# Patient Record
Sex: Female | Born: 1955 | Race: Black or African American | Hispanic: No | Smoking: Current every day smoker
Health system: Southern US, Community
[De-identification: ages and names within clinical notes are randomized; demographics above are authoritative.]

## PROBLEM LIST (undated history)

## (undated) DIAGNOSIS — D649 Anemia, unspecified: Secondary | ICD-10-CM

## (undated) DIAGNOSIS — E119 Type 2 diabetes mellitus without complications: Secondary | ICD-10-CM

## (undated) DIAGNOSIS — I10 Essential (primary) hypertension: Secondary | ICD-10-CM

## (undated) DIAGNOSIS — F209 Schizophrenia, unspecified: Secondary | ICD-10-CM

## (undated) DIAGNOSIS — J449 Chronic obstructive pulmonary disease, unspecified: Secondary | ICD-10-CM

## (undated) DIAGNOSIS — M48062 Spinal stenosis, lumbar region with neurogenic claudication: Secondary | ICD-10-CM

## (undated) DIAGNOSIS — J441 Chronic obstructive pulmonary disease with (acute) exacerbation: Secondary | ICD-10-CM

## (undated) DIAGNOSIS — C801 Malignant (primary) neoplasm, unspecified: Secondary | ICD-10-CM

## (undated) DIAGNOSIS — F32A Depression, unspecified: Secondary | ICD-10-CM

## (undated) DIAGNOSIS — G4730 Sleep apnea, unspecified: Secondary | ICD-10-CM

## (undated) DIAGNOSIS — K219 Gastro-esophageal reflux disease without esophagitis: Secondary | ICD-10-CM

## (undated) DIAGNOSIS — E785 Hyperlipidemia, unspecified: Secondary | ICD-10-CM

## (undated) DIAGNOSIS — M1990 Unspecified osteoarthritis, unspecified site: Secondary | ICD-10-CM

## (undated) DIAGNOSIS — F419 Anxiety disorder, unspecified: Secondary | ICD-10-CM

## (undated) DIAGNOSIS — F205 Residual schizophrenia: Secondary | ICD-10-CM

## (undated) DIAGNOSIS — D509 Iron deficiency anemia, unspecified: Secondary | ICD-10-CM

## (undated) DIAGNOSIS — I700 Atherosclerosis of aorta: Secondary | ICD-10-CM

## (undated) DIAGNOSIS — G5603 Carpal tunnel syndrome, bilateral upper limbs: Secondary | ICD-10-CM

## (undated) HISTORY — PX: APPENDECTOMY: SHX54

## (undated) HISTORY — PX: JOINT REPLACEMENT: SHX530

## (undated) HISTORY — DX: Anemia, unspecified: D64.9

## (undated) HISTORY — DX: Chronic obstructive pulmonary disease with (acute) exacerbation: J44.1

---

## 2008-12-05 ENCOUNTER — Inpatient Hospital Stay: Payer: Self-pay | Admitting: Psychiatry

## 2009-04-06 ENCOUNTER — Emergency Department: Payer: Self-pay

## 2009-06-18 ENCOUNTER — Inpatient Hospital Stay: Payer: Self-pay | Admitting: Internal Medicine

## 2010-07-30 ENCOUNTER — Emergency Department: Payer: Self-pay | Admitting: Unknown Physician Specialty

## 2010-08-12 ENCOUNTER — Inpatient Hospital Stay: Payer: Self-pay | Admitting: Internal Medicine

## 2010-08-16 DIAGNOSIS — I359 Nonrheumatic aortic valve disorder, unspecified: Secondary | ICD-10-CM

## 2010-09-25 ENCOUNTER — Emergency Department: Payer: Self-pay | Admitting: *Deleted

## 2010-09-25 ENCOUNTER — Emergency Department: Payer: Self-pay | Admitting: Emergency Medicine

## 2011-06-25 ENCOUNTER — Ambulatory Visit: Payer: Self-pay | Admitting: Pain Medicine

## 2011-10-08 ENCOUNTER — Ambulatory Visit: Payer: Self-pay | Admitting: Unknown Physician Specialty

## 2011-10-08 DIAGNOSIS — I10 Essential (primary) hypertension: Secondary | ICD-10-CM

## 2011-10-08 LAB — BASIC METABOLIC PANEL
Anion Gap: 9 (ref 7–16)
BUN: 9 mg/dL (ref 7–18)
Creatinine: 0.9 mg/dL (ref 0.60–1.30)
EGFR (African American): >60
Osmolality: 281 (ref 275–301)
Potassium: 3.7 mmol/L (ref 3.5–5.1)

## 2011-10-08 LAB — URINALYSIS, COMPLETE
Bacteria: NONE SEEN
Glucose,UR: NEGATIVE mg/dL (ref 0–75)
Hyaline Cast: 3
Leukocyte Esterase: NEGATIVE
Protein: NEGATIVE
RBC,UR: 5 /HPF (ref 0–5)
Specific Gravity: 1.016 (ref 1.003–1.030)
Squamous Epithelial: 13
WBC UR: <1 /HPF (ref 0–5)

## 2011-10-08 LAB — MRSA PCR SCREENING

## 2011-10-08 LAB — PROTIME-INR: INR: 0.9

## 2011-10-08 LAB — CBC
HCT: 40.1 % (ref 35.0–47.0)
MCV: 84 fL (ref 80–100)
Platelet: 208 10*3/uL (ref 150–440)

## 2011-10-20 ENCOUNTER — Inpatient Hospital Stay: Payer: Self-pay | Admitting: Unknown Physician Specialty

## 2011-10-20 LAB — ELECTROLYTE PANEL
Anion Gap: 8 (ref 7–16)
Chloride: 105 mmol/L (ref 98–107)
Co2: 28 mmol/L (ref 21–32)
Potassium: 4 mmol/L (ref 3.5–5.1)

## 2011-10-20 LAB — HEMOGLOBIN: HGB: 10.2 g/dL — ABNORMAL LOW (ref 12.0–16.0)

## 2011-10-21 LAB — BASIC METABOLIC PANEL
Calcium, Total: 7.6 mg/dL — ABNORMAL LOW (ref 8.5–10.1)
Chloride: 99 mmol/L (ref 98–107)
Creatinine: 0.66 mg/dL (ref 0.60–1.30)
EGFR (African American): >60
EGFR (Non-African Amer.): >60
Glucose: 197 mg/dL — ABNORMAL HIGH (ref 65–99)
Sodium: 135 mmol/L — ABNORMAL LOW (ref 136–145)

## 2011-10-22 LAB — TSH: Thyroid Stimulating Horm: 2.35 u[IU]/mL

## 2011-10-22 LAB — BASIC METABOLIC PANEL
Anion Gap: 8 (ref 7–16)
BUN: 6 mg/dL — ABNORMAL LOW (ref 7–18)
Calcium, Total: 8.2 mg/dL — ABNORMAL LOW (ref 8.5–10.1)
Chloride: 99 mmol/L (ref 98–107)
Creatinine: 0.77 mg/dL (ref 0.60–1.30)
Glucose: 182 mg/dL — ABNORMAL HIGH (ref 65–99)
Osmolality: 274 (ref 275–301)
Potassium: 4.1 mmol/L (ref 3.5–5.1)

## 2011-10-22 LAB — HEMOGLOBIN: HGB: 9.6 g/dL — ABNORMAL LOW (ref 12.0–16.0)

## 2011-10-22 LAB — CK TOTAL AND CKMB (NOT AT ARMC)
CK, Total: 378 U/L — ABNORMAL HIGH (ref 21–215)
CK-MB: 0.6 ng/mL (ref 0.5–3.6)

## 2011-10-22 LAB — TROPONIN I: Troponin-I: <0.02 ng/mL

## 2011-10-23 LAB — CK TOTAL AND CKMB (NOT AT ARMC)
CK, Total: 298 U/L — ABNORMAL HIGH (ref 21–215)
CK, Total: 266 U/L — ABNORMAL HIGH (ref 21–215)
CK-MB: <0.5 ng/mL — ABNORMAL LOW (ref 0.5–3.6)
CK-MB: <0.5 ng/mL — ABNORMAL LOW (ref 0.5–3.6)

## 2011-10-23 LAB — BASIC METABOLIC PANEL
BUN: 7 mg/dL (ref 7–18)
Calcium, Total: 7.9 mg/dL — ABNORMAL LOW (ref 8.5–10.1)
Creatinine: 0.73 mg/dL (ref 0.60–1.30)
EGFR (Non-African Amer.): >60
Glucose: 184 mg/dL — ABNORMAL HIGH (ref 65–99)
Osmolality: 277 (ref 275–301)
Sodium: 137 mmol/L (ref 136–145)

## 2011-10-23 LAB — CBC WITH DIFFERENTIAL/PLATELET
Basophil #: 0.1 10*3/uL (ref 0.0–0.1)
Basophil %: 0.5 %
Eosinophil %: 0.2 %
HCT: 26.8 % — ABNORMAL LOW (ref 35.0–47.0)
Lymphocyte %: 17.3 %
MCH: 26.6 pg (ref 26.0–34.0)
MCHC: 31.8 g/dL — ABNORMAL LOW (ref 32.0–36.0)
MCV: 84 fL (ref 80–100)
Monocyte #: 1 x10 3/mm — ABNORMAL HIGH (ref 0.2–0.9)
Monocyte %: 8.5 %
Neutrophil %: 73.5 %
RDW: 16.4 % — ABNORMAL HIGH (ref 11.5–14.5)

## 2011-10-23 LAB — TROPONIN I: Troponin-I: <0.02 ng/mL

## 2011-10-24 LAB — URINALYSIS, COMPLETE
Ketone: NEGATIVE
Ph: 7 (ref 4.5–8.0)
Protein: NEGATIVE
Specific Gravity: 1.005 (ref 1.003–1.030)
Squamous Epithelial: 3
WBC UR: 49 /HPF (ref 0–5)

## 2011-10-24 LAB — HEMOGLOBIN: HGB: 7.9 g/dL — ABNORMAL LOW (ref 12.0–16.0)

## 2011-10-25 LAB — HEMOGLOBIN: HGB: 8 g/dL — ABNORMAL LOW (ref 12.0–16.0)

## 2011-10-25 LAB — HEMATOCRIT: HCT: 25.3 % — ABNORMAL LOW (ref 35.0–47.0)

## 2011-10-25 LAB — CK: CK, Total: 173 U/L (ref 21–215)

## 2011-10-26 LAB — URINE CULTURE

## 2011-10-27 LAB — CREATININE, SERUM
EGFR (African American): >60
EGFR (Non-African Amer.): >60

## 2012-05-04 ENCOUNTER — Ambulatory Visit: Payer: Self-pay | Admitting: Physical Medicine and Rehabilitation

## 2012-08-18 ENCOUNTER — Ambulatory Visit: Payer: Self-pay | Admitting: Internal Medicine

## 2012-12-01 ENCOUNTER — Ambulatory Visit: Payer: Self-pay | Admitting: Unknown Physician Specialty

## 2013-06-10 ENCOUNTER — Emergency Department: Payer: Self-pay | Admitting: Emergency Medicine

## 2013-07-21 ENCOUNTER — Ambulatory Visit: Payer: Self-pay | Admitting: Otolaryngology

## 2013-07-21 LAB — CBC WITH DIFFERENTIAL/PLATELET
BASOS PCT: 0.9 %
Basophil #: 0.1 10*3/uL (ref 0.0–0.1)
EOS ABS: 0 10*3/uL (ref 0.0–0.7)
EOS PCT: 0.7 %
HCT: 36.5 % (ref 35.0–47.0)
HGB: 12 g/dL (ref 12.0–16.0)
LYMPHS ABS: 2.4 10*3/uL (ref 1.0–3.6)
Lymphocyte %: 34 %
MCH: 25.5 pg — ABNORMAL LOW (ref 26.0–34.0)
MCHC: 32.8 g/dL (ref 32.0–36.0)
MCV: 78 fL — AB (ref 80–100)
MONO ABS: 0.4 x10 3/mm (ref 0.2–0.9)
Monocyte %: 5.8 %
Neutrophil #: 4.1 10*3/uL (ref 1.4–6.5)
Neutrophil %: 58.6 %
PLATELETS: 246 10*3/uL (ref 150–440)
RBC: 4.69 10*6/uL (ref 3.80–5.20)
RDW: 17.5 % — ABNORMAL HIGH (ref 11.5–14.5)
WBC: 7 10*3/uL (ref 3.6–11.0)

## 2013-07-21 LAB — BASIC METABOLIC PANEL
ANION GAP: 2 — AB (ref 7–16)
BUN: 9 mg/dL (ref 7–18)
Calcium, Total: 8.4 mg/dL — ABNORMAL LOW (ref 8.5–10.1)
Chloride: 105 mmol/L (ref 98–107)
Co2: 31 mmol/L (ref 21–32)
Creatinine: 0.71 mg/dL (ref 0.60–1.30)
EGFR (African American): >60
GLUCOSE: 144 mg/dL — AB (ref 65–99)
OSMOLALITY: 277 (ref 275–301)
POTASSIUM: 4 mmol/L (ref 3.5–5.1)
SODIUM: 138 mmol/L (ref 136–145)

## 2013-07-25 ENCOUNTER — Ambulatory Visit: Payer: Self-pay | Admitting: Otolaryngology

## 2013-07-27 LAB — PATHOLOGY REPORT

## 2013-08-27 DIAGNOSIS — F209 Schizophrenia, unspecified: Secondary | ICD-10-CM | POA: Insufficient documentation

## 2013-08-27 DIAGNOSIS — F205 Residual schizophrenia: Secondary | ICD-10-CM

## 2013-08-27 DIAGNOSIS — J449 Chronic obstructive pulmonary disease, unspecified: Secondary | ICD-10-CM | POA: Insufficient documentation

## 2013-08-27 DIAGNOSIS — E1122 Type 2 diabetes mellitus with diabetic chronic kidney disease: Secondary | ICD-10-CM | POA: Insufficient documentation

## 2013-08-27 DIAGNOSIS — I10 Essential (primary) hypertension: Secondary | ICD-10-CM | POA: Insufficient documentation

## 2013-08-27 DIAGNOSIS — M48062 Spinal stenosis, lumbar region with neurogenic claudication: Secondary | ICD-10-CM | POA: Insufficient documentation

## 2013-09-08 ENCOUNTER — Ambulatory Visit: Payer: Self-pay | Admitting: Anesthesiology

## 2013-09-23 DIAGNOSIS — E785 Hyperlipidemia, unspecified: Secondary | ICD-10-CM | POA: Insufficient documentation

## 2013-10-31 DIAGNOSIS — G4730 Sleep apnea, unspecified: Secondary | ICD-10-CM | POA: Insufficient documentation

## 2013-11-09 ENCOUNTER — Ambulatory Visit: Payer: Self-pay | Admitting: Specialist

## 2013-11-11 ENCOUNTER — Ambulatory Visit: Payer: Self-pay | Admitting: Internal Medicine

## 2013-12-13 ENCOUNTER — Ambulatory Visit: Payer: Self-pay | Admitting: Specialist

## 2014-03-04 DIAGNOSIS — I700 Atherosclerosis of aorta: Secondary | ICD-10-CM | POA: Insufficient documentation

## 2014-09-09 NOTE — Op Note (Signed)
PATIENT NAME:  Deanna Gay, WOODLIEF MR#:  [id number] DATE OF BIRTH:  1955-06-05  DATE OF PROCEDURE:  07/25/2013  PREOPERATIVE DIAGNOSES: Left oral cavity lesion.   POSTOPERATIVE DIAGNOSIS:  Left oral cavity lesion.  PROCEDURE PERFORMED: Excision of a left oral cavity lesion on the gingival buccal sulcus.   SURGEON: Carloyn Manner, M.D.   ANESTHESIA: General endotracheal anesthesia.   ESTIMATED BLOOD LOSS: Less than 10 mL.   IV FLUIDS: Please see anesthesia record.   COMPLICATIONS: None.   DRAINS AND STENT PLACEMENTS: None.   SPECIMENS: Left buccal lesion.   INDICATIONS FOR PROCEDURE: The patient is a 59 year old female with a history of a nonhealing ulcer and pedunculated exophytic lesion that is broad-based of the left buccal mucosa, just at the gingival buccal sulcus on the left side of her oral cavity. Biopsy was proven to be consistent with inflammation.   OPERATIVE FINDINGS: Large, broad-based left buccal lesion sent for permanent pathological evaluation.   DESCRIPTION OF PROCEDURE: The patient was identified in holding. Benefits and risks of the procedure were discussed and consent was reviewed. The patient was taken to the operating room and placed in the supine position. General endotracheal anesthesia was induced. Side-biting mouth gag was inserted into the patient's oral cavity; 2 mL of 1% lidocaine 1:100,000 epinephrine were injected into the broad-based left exophytic lesion just adjacent to the gingival buccal sulcus on the left, and then with Allis clamps the lesion was grasped in its entirety and then using Bovie electrocautery with the needle tip, the base of the lesion was separated from the underlying mucosa and the lesion was removed. This extended to the muscular fascia. At this time, hemostasis was achieved using Bovie electrocautery. Palpation revealed successful removal of all of the broad-based lesion, and the edges of the mucosa were undermined with a black-handled  scissors, and then the incision was closed in an interrupted fashion using 4-0 chromic suture. At this time, care of the patient was transferred to anesthesia, where the patient tolerated the procedure well and was taken to PACU in good condition.     ____________________________ Jerene Bears, MD ccv:dmm D: 07/25/2013 07:58:00 ET T: 07/25/2013 10:31:54 ET JOB#: [id number]  cc: Jerene Bears, MD, <Dictator> Jerene Bears MD ELECTRONICALLY SIGNED 08/22/2013 11:37

## 2014-09-10 NOTE — Discharge Summary (Signed)
PATIENT NAME:  Deanna Gay, Deanna Gay MR#:  [id number] DATE OF BIRTH:  Sep 26, 1955  DATE OF ADMISSION:  10/20/2011 DATE OF DISCHARGE:  10/27/2011  PREOPERATIVE DIAGNOSIS: Severe degenerative arthritis, right hip.  DISCHARGE DIAGNOSIS: Severe degenerative arthritis, right hip.   PROCEDURE: Stryker total hip replacement on the right.   SURGEON: Kathrene Alu., MD   FIRST ASSISTANT: Rachelle Hora, PA-C   ANESTHESIA: Spinal.   COMPLICATIONS: None.   The patient was brought to the recovery room, brought down to the orthopedic floor where she was treated by physical therapy, Internal Medicine as well as for pain control.   HISTORY: The patient had a long history of right hip discomfort. X-rays revealed bone on bone pathology referable to her right hip. The patient was also brought in for a total hip replacement on the right side. The patient had failed anti-inflammatory medications, hip injections, and narcotic pain medicines. She was having pain at rest as well as with activity. The patient's pain was interfering with activities of daily living.   PHYSICAL EXAMINATION: GENERAL: Well developed, well nourished female in no apparent distress. Normal affect. Gait is with a cane, antalgic component, and limp on the right lower extremity. HEENT: Head is normocephalic, atraumatic. Pupils equal, round, and reactive to light and accommodation. The patient does have some exophthalmos of the orbits. LUNGS: Clear to auscultation. Good breath sounds bilaterally. No wheezing, rales, or rhonchi. HEART: Normal sinus rhythm without murmur. RIGHT HIP: The patient is mildly tender in the groin as well as the lateral hip. The patient has severe pain with all ranges of motion particularly with internal and external rotation of the right hip which gives her severe groin discomfort. There is no crepitus of the right hip. There is no shortening noted in bilateral lower extremities. The patient has full flexion of the  hip, 60 to 70 degrees abduction of the right hip from 25 to 30 degrees. There is no significant swelling or edema in the leg. The patient is neurovascularly intact to the right lower extremity.   HOSPITAL COURSE: The patient was admitted to the hospital on 10/20/2011 and had surgery that same day and was doing well postop. On 06/04 the patient was found to have a consistent sinus tachycardia. [facility] were brought in. The patient had a V/Q scan, serial troponins, chest x-ray, as well as EKGs with telemetry. All imaging and lab work were negative. On 10/24/2011, hemoglobin dropped to 7.9. The patient did receive 1 unit of packed red blood cells. This did seem to help with the patient's heart rate. On 10/26/2011, the patient had a urinary tract infection and was started on Levaquin. By 10/27/2011, the patient was doing very well. She had progressed well with physical therapy up to 230 feet. She had pain under control. Vital signs were stable and she was ready for discharge and stable to go to rehab.   DISCHARGE INSTRUCTIONS:  1. Follow-up with Dr. Jefm Bryant in five weeks. She is to call [facility] to schedule an appointment.  2. Call MD if fingerstick blood sugar is greater than 400.  3. She is to be evaluated and treated by occupational therapy as well as physical therapy.  4. She can walk with partial weightbearing on right lower extremity.  5. She is to continue movement of feet and toes. 6. Continue incentive spirometry every one hour.  7. She is to cough and have deep breaths every two hours.  8. She is to wear TED  hose, thigh highs, bilaterally. 9. She is to use abduction pillow between legs when in bed.  10. She may elevate the head of bed up to 75 degrees. 11. She needs to keep the heels elevated off the bed. 12. Apply Cover Derm as needed.  13. She is to have staples removed on June 17th and apply benzoin and half-inch Steri-Strips.   DISCHARGE MEDICATIONS:   1. Tylenol ES tablet 500 to 1000 mg oral q.4 hours p.r.n. for pain or temp greater than 100.4. 2. Docusate calcium capsule 240 mg oral daily. 3. Insulin SS Novolin R injections subcutaneous if FSBS before meals and at bedtime; 0 units if FSBS 0 to 200, 4 units if FSBS 201 to 250, 6 units if FSBS 251 to 300, 8 units if FSBS 301 to 350, 10 units if FSBS 351 to 400. 4. Oxycodone 5 to 10 mg oral q.3 to 4 hours p.r.n. for pain.  5. Gabapentin 300 mg oral t.i.d.  6. Metformin 100 mg oral b.i.d.  7. Omeprazole 20 mg oral q.6 a.m.  8. Risperidone 2 mg oral at bedtime.  9. Budesonide/formoterol 160/4.5 mcg HFA inhaler 2 puffs inhalation b.i.d.  10. Nicotine patch 14 mg transdermal daily. 11. Nicotine oral inhaler kit one cartridge inhalation q.2 hours p.r.n. for nicotine craving. 12. Docusate senna 50/8.6 mg tablet 1 tablet oral q.12 hours. 13. Bisacodyl suppository 10 mg rectally daily p.r.n. for constipation.  14. Mag hydroxide 30 mL oral b.i.d. p.r.n. constipation.  15. Lovenox 40 mg subcutaneous b.i.d. for prophylaxis treatments of thrombotic disorders. Discontinue in 14 days. 16. Diltiazem tablet 30 mg oral q.6 hours.  17. Albuterol oral inhaler 2 puffs inhalation q.4 hours awake with spacer.  18. CPAP.   ____________________________ Duanne Guess, PA-C tcg:drc D: 10/27/2011 12:49:27 ET T: 10/27/2011 13:16:42 ET JOB#: [id number]  cc: Duanne Guess, PA-C, <Dictator> Duanne Guess [place] ELECTRONICALLY SIGNED 11/18/2011 16:53

## 2014-09-10 NOTE — Op Note (Signed)
PATIENT NAME:  Deanna Gay, Deanna Gay MR#:  [id number] DATE OF BIRTH:  07-18-1955  DATE OF PROCEDURE:  10/20/2011  PREOPERATIVE DIAGNOSIS: Severe degenerative arthritis, right hip.   POSTOPERATIVE DIAGNOSIS: Severe degenerative arthritis, right hip.   PROCEDURE: Stryker total hip replacement on the right.   SURGEON: Kathrene Alu., MD  FIRST ASSISTANT: Rachelle Hora, PA-C.   ANESTHESIA: Spinal.   HISTORY: Patient had a long history of right hip discomfort. X-rays revealed bone on bone pathology referable to her right hip. Patient was ultimately brought in for a total hip replacement on the right.   DESCRIPTION OF PROCEDURE: Patient taken the Operating Room where satisfactory spinal anesthesia was achieved. Following this the patient was placed in the lateral decubitus position with the right hip up. The right hip was then prepped and draped in the usual fashion for total hip procedure. Patient was given a gram of vancomycin IV prior to the start of the procedure.   Next, a slightly curved posterolateral hip incision was made. Dissection was carried down through the subcutaneous tissue onto the gluteus maximus fascia lata. They were divided in line with the incision. Bleeding was controlled with coagulation cautery. Greater trochanteric bursa was excised. Charnley retractor was inserted. Care was taken to protect the sciatic nerve.   The piriformis attachment to the greater trochanter was divided and then the remaining external rotators divided at their attachment to the greater trochanter, reflected over the sciatic nerve. The capsule was divided in a T-shaped fashion and ultimately a superior and inferior capsulectomy was performed. Smooth Steinmann pin was placed through a puncture wound into the ilium just above the acetabulum. It was bent at right angles to use as a leg length guide.   Next, the hip was dislocated. The appropriate angle for the neck osteotomy was outlined on the femoral  neck and then the oscillating saw was used to make the femoral neck cut. It was made about a fingerbreadth above the lesser trochanter. The hip was then positioned with the appropriate retractors for reaming of the acetabulum. The acetabulum was then reamed to accommodate a 56 mm acetabular shell. The shell was impacted into the acetabulum at about 40 to 45 degrees of abduction and about 10 to 15 degrees of forward flexion.   One cancellus screw was inserted into the acetabular cup at this time to give better fixation. A trial 0 degree liner was inserted. I then turned my attention to the femur. I went ahead and broached the proximal femur to accommodate an Accolade #2 hip stem. The Accolade stem was placed at about 15 to 20 degrees of anteversion.   I then be performed a trial reduction with a 36 mm trial head with a +0 mm offset. The hip reduced without too much difficulty. It moved well and was stable. Leg length was checked, indeed the leg length was the same as prior to surgery which was quite acceptable.   Next, I removed the trial broach and the trial insert.    I did insert one more 6.5 mm screw into the acetabular shell to give it even more stability.   I then impacted the 36 mm 0 degree polyethylene insert into the acetabular shell and then I impacted the #2 Accolade femoral component into the proximal femur. The neck angle on the Accolade component was 132 degrees. Trial reduction with 70m head was performed.   After the trial head was removed I  impacted the permanent 36 mm femoral head with  a +0 offset onto the femoral component. This was reduced into the acetabulum. The hip moved well. It seemed to be quite stable.   The wound was irrigated with GU irrigant at this time. The external rotators were reattached to the greater trochanter through drill holes with #2 Orthocord sutures. Two Hemovac tubes were inserted in the depths of the wound. The gluteus maximus andfascia lata were closed  with #1 Ethibond and #1 Vicryl sutures. The sub-Q was closed with 0 and 2-0 Vicryl and the skin with skin staples. There were two puncture wounds proximal to incision that were closed with 3-0 nylon in vertical mattress fashion.   Betadine was applied to the wounds. Four TENS pads were placed about the wounds and then sterile dressing was applied. An abduction pillow was placed between the patient's legs. She was then turned supine and transferred to her stretcher bed. She was taken to the recovery room in satisfactory condition. Blood loss was between 500 to 750 mL. No blood was transfused during the course of the procedure.   SUMMARY OF IMPLANTS USED: We used a Stryker 56 mm acetabular shell, a 36 mm 0 degree polyethylene insert, #2 Accolade femoral component with 132 degree neck angle, a 36 mm femoral head with a +0 offset and two 6.5 cancellous bone screws.   ____________________________ Kathrene Alu., MD hbk:cms D: 10/22/2011 10:38:00 ET T: 10/22/2011 11:12:53 ET JOB#: [id number]  cc: Kathrene Alu., MD, <Dictator> Vilinda Flake, Brooke Bonito MD ELECTRONICALLY SIGNED 10/27/2011 19:09

## 2014-09-10 NOTE — Consult Note (Signed)
PATIENT NAME:  Deanna Gay, Deanna Gay MR#:  [id number] DATE OF BIRTH:  02-25-1956  DATE OF CONSULTATION:  10/22/2011  REFERRING PHYSICIAN:  Leanor Kail, MD CONSULTING PHYSICIAN:  Avery Klingbeil H. Posey Pronto, MD PRIMARY CARE PHYSICIAN: [facility]  REASON FOR CONSULTATION: Tachycardia. Opinion regarding patient's diabetes, hypertension, sleep apnea, and chronic obstructive pulmonary disease.   HISTORY OF PRESENT ILLNESS: The patient is a 59 year old African American female who was hospitalized for elective right hip replacement, which she underwent on 10/20/2011. The patient has been doing okay postoperative, has not had any major issues except she is noted to have increase in heart rate from surgery. During surgery her heart rate was normal and then postoperatively her heart rate has steadily gone up from 104 into the 120s. We are asked to evaluate the patient for further recommendations. The patient reports that she has not felt that her heart was beating fast. She denies any palpitations. She does complain of shortness of breath; especially when physical therapy worked with her the patient got really short of breath. She does get short of breath with exertion. She denies any chest pain. Denies any fevers. Has a chronic cough, which is nonproductive. Has not had any fevers or chills. Denies any wheezing. She does report that she normally uses nebulizers as needed but has not received nebulizers here. She has been getting her Symbicort. She denies any abdominal pain or leg, calf pain.   PAST MEDICAL HISTORY:  1. Schizophrenia.  2. Depression.  3. Hypertension.  4. Sleep apnea on CPAP which she is receiving here.  5. Chronic obstructive pulmonary disease, ongoing tobacco abuse.  6. Diabetes.  7. Gastroesophageal reflux disease.  8. Osteoarthritis.  9. History of bipolar disorder.  10. Migraine headaches.  11. Previous history of toxic shock syndrome.   PAST SURGICAL HISTORY: Status post appendectomy.    ALLERGIES: Penicillin, contrast dye.   CURRENT MEDICATIONS:  1. Albuterol nebulizer q. 4 p.r.n.  2. Enalapril 20, 1 tab p.o. daily.  3. Gabapentin 300 three times per day.  4. Glucophage 1000 mg, 1 tab p.o. b.i.d.  5. Hydrochlorothiazide 25, 1 tab p.o. daily.  6. Meloxicam 7.5 daily.  7. NovoLog sliding scale.  8. Omeprazole 20 daily.  9. Percocet 5/325, 1 tab p.o. q. 6 p.r.n.  10. ProAir HFA 2 puffs as needed.  11. Risperdal 2 mg at bedtime.  12. Symbicort 160/4.5 mcg, two sprays b.i.d.   SOCIAL HISTORY: Patient continues to smoke about a pack a day. Denies any alcohol or drug use.    FAMILY HISTORY: Mother had diabetes and hypertension. Father died at a young age from a gunshot wound.   REVIEW OF SYSTEMS:  CONSTITUTIONAL: Denies any fevers. Complains of just some weakness. No pain, weight loss or weight gain. EYES: No blurred or double vision. No redness. No inflammation. No glaucoma. No cataracts. ENT: No tinnitus. No ear pain. No hearing loss. No seasonal or year-round allergies. No epistaxis. Does have history of sleep apnea. RESPIRATORY: Complains of nonproductive cough. No significant wheezing currently. No hemoptysis. Has chronic dyspnea. Has chronic obstructive pulmonary disease. No tuberculosis.  CARDIOVASCULAR: No chest pain. No orthopnea. No edema. No known arrhythmias. Has high blood pressure. GI: No nausea, vomiting, or diarrhea. No abdominal pain. No hematemesis. No melena. No ulcers. GU: Denies any dysuria, hematuria, renal calculus, or frequency. ENDO: Denies any polyuria, nocturia, or thyroid problems. HEME/LYMPH: Denies anemia, easy bruisability, or bleeding. SKIN: No acne. No rash. No changes mole, hair, or skin.  MUSCULOSKELETAL: Has pain  in multiple areas of her body related to osteoarthritis. She denies gout. NEUROLOGIC: No numbness. No cerebrovascular accident. No transient ischemic attack. No seizures. PSYCHIATRIC: No anxiety. No insomnia. No ADD. No OCD      PHYSICAL EXAMINATION: VITAL SIGNS: Temperature  98.9, pulse 121, respirations 20, blood pressure 111/64, O2 95% on room air.   GENERAL: The patient is a morbidly obese female, currently not in any acute distress.   HEENT: Head atraumatic, normocephalic. Pupils are equal, round, reactive to light and accommodation. There is no conjunctival pallor. No scleral icterus. Extraocular movements intact. Nasal exam shows no drainage or ulceration.  Oropharynx is clear without any exudates.   NECK: No thyromegaly. No carotid bruits.   CARDIOVASCULAR: Tachycardic, which is regular. No murmurs, rubs, clicks, or gallops. PMI is not displaced.   LUNGS: Clear to auscultation bilaterally without any rales, rhonchi, or wheezing.   ABDOMEN: Soft, nontender, nondistended. Positive bowel sounds times four.   EXTREMITIES: No clubbing, cyanosis, or edema.   SKIN: No rash.   LYMPHATICS: No lymph nodes palpable.   VASCULAR: Good DP, PT pulses.   PSYCHIATRIC: Not anxious or depressed.   NEUROLOGICAL: Awake, alert, oriented times three. No focal deficits.   PSYCHIATRIC: Not anxious or depressed.   LABORATORY, DIAGNOSTIC, AND RADIOLOGICAL DATA: The patient's hemoglobin earlier today was 9.6, last BMP was yesterday and showed a sodium 135 and calcium was 7.6. Otherwise BMP was normal.   ASSESSMENT AND PLAN: The patient is a 59 year old African American female status post a right hip replacement, postoperative day three. Reason for consult is elevated heart rate, shortness of breath.  1. Tachycardia: At this time we will go ahead and get a stat EKG and place the patient on telemetry. The patient likely has sinus tachycardia. Likely cause for this could be pain, although the patient is not in any significant amount of pain. Also generalized deconditioning could cause this along with surgery. We will check serial cardiac enzymes. She is not that anemic to explain anemia as the cause of her tachycardia. We  will repeat hemoglobin in the morning. No need to transfuse. Also, the patient has shortness of breath with ambulation, has morbid obesity, at very high risk for pulmonary embolism. Due to contrast allergy, I will go ahead and get a V/Q scan to rule out PE. We will start her on low dose Cardizem to help control her heart rate. We will also check a TSH and also check electrolytes and make sure there are no electrolyte imbalances including magnesium derangements.  2. Chronic obstructive pulmonary disease, which is chronic: We will add Xopenex nebulizers q. 8 while awake, which she was using in nebulizers at home. Continue Symbicort as taking at home.  3. Sleep apnea: Continue CPAP as currently doing.  4. Diabetes: Continue Glucophage and sliding scale. Her blood sugars are elevated likely due to current surgery and stress. We will need to adjust her regimen as needed.  5. Hypertension: Continue enalapril and HCTZ. Her blood pressure seems to be stable at this point.  6. Miscellaneous: Continue Lovenox for deep vein thrombosis prophylaxis as you are doing. I will add incentive spirometry to her current treatment plan.  TIME SPENT: 45 minutes.  ____________________________ Lafonda Mosses Posey Pronto, MD shp:bjt D: 10/22/2011 14:45:13 ET T: 10/22/2011 15:15:12 ET JOB#: [id number]  cc: Kennadie Brenner H. Posey Pronto, MD, <Dictator> Alric Seton MD ELECTRONICALLY SIGNED 10/27/2011 8:05

## 2014-12-22 ENCOUNTER — Encounter: Payer: Self-pay | Admitting: *Deleted

## 2014-12-25 ENCOUNTER — Ambulatory Visit: Payer: Medicare Other | Admitting: Anesthesiology

## 2014-12-25 ENCOUNTER — Encounter: Payer: Self-pay | Admitting: *Deleted

## 2014-12-25 ENCOUNTER — Ambulatory Visit
Admission: RE | Admit: 2014-12-25 | Discharge: 2014-12-25 | Disposition: A | Discharge status: Home / Self Care | Payer: Medicare Other | Source: Ambulatory Visit | Attending: Gastroenterology | Admitting: Gastroenterology

## 2014-12-25 ENCOUNTER — Encounter
Admission: RE | Disposition: A | Discharge status: Home / Self Care | Payer: Self-pay | Source: Ambulatory Visit | Attending: Gastroenterology

## 2014-12-25 ENCOUNTER — Other Ambulatory Visit: Payer: Self-pay

## 2014-12-25 DIAGNOSIS — E119 Type 2 diabetes mellitus without complications: Secondary | ICD-10-CM | POA: Insufficient documentation

## 2014-12-25 DIAGNOSIS — F17210 Nicotine dependence, cigarettes, uncomplicated: Secondary | ICD-10-CM | POA: Insufficient documentation

## 2014-12-25 DIAGNOSIS — K5730 Diverticulosis of large intestine without perforation or abscess without bleeding: Secondary | ICD-10-CM | POA: Diagnosis not present

## 2014-12-25 DIAGNOSIS — K219 Gastro-esophageal reflux disease without esophagitis: Secondary | ICD-10-CM | POA: Insufficient documentation

## 2014-12-25 DIAGNOSIS — B3781 Candidal esophagitis: Secondary | ICD-10-CM | POA: Insufficient documentation

## 2014-12-25 DIAGNOSIS — Z1211 Encounter for screening for malignant neoplasm of colon: Secondary | ICD-10-CM | POA: Diagnosis not present

## 2014-12-25 DIAGNOSIS — I10 Essential (primary) hypertension: Secondary | ICD-10-CM | POA: Insufficient documentation

## 2014-12-25 HISTORY — DX: Malignant (primary) neoplasm, unspecified: C80.1

## 2014-12-25 HISTORY — DX: Type 2 diabetes mellitus without complications: E11.9

## 2014-12-25 HISTORY — DX: Schizophrenia, unspecified: F20.9

## 2014-12-25 HISTORY — DX: Sleep apnea, unspecified: G47.30

## 2014-12-25 HISTORY — DX: Chronic obstructive pulmonary disease, unspecified: J44.9

## 2014-12-25 HISTORY — DX: Unspecified osteoarthritis, unspecified site: M19.90

## 2014-12-25 HISTORY — PX: COLONOSCOPY WITH PROPOFOL: SHX5780

## 2014-12-25 HISTORY — DX: Gastro-esophageal reflux disease without esophagitis: K21.9

## 2014-12-25 HISTORY — PX: ESOPHAGOGASTRODUODENOSCOPY (EGD) WITH PROPOFOL: SHX5813

## 2014-12-25 HISTORY — DX: Depression, unspecified: F32.A

## 2014-12-25 HISTORY — DX: Essential (primary) hypertension: I10

## 2014-12-25 LAB — GLUCOSE, CAPILLARY: Glucose-Capillary: 111 mg/dL — ABNORMAL HIGH (ref 65–99)

## 2014-12-25 SURGERY — COLONOSCOPY WITH PROPOFOL
Anesthesia: General

## 2014-12-25 MED ORDER — PROPOFOL INFUSION 10 MG/ML OPTIME
INTRAVENOUS | Status: DC | PRN
  Administered 2014-12-25: 100 ug/kg/min via INTRAVENOUS

## 2014-12-25 MED ORDER — FENTANYL CITRATE (PF) 100 MCG/2ML IJ SOLN
INTRAMUSCULAR | Status: DC | PRN
  Administered 2014-12-25: 50 ug via INTRAVENOUS

## 2014-12-25 MED ORDER — SODIUM CHLORIDE 0.9 % IV SOLN: INTRAVENOUS | Status: DC

## 2014-12-25 MED ORDER — MIDAZOLAM HCL 2 MG/2ML IJ SOLN
INTRAMUSCULAR | Status: DC | PRN
  Administered 2014-12-25: 1 mg via INTRAVENOUS

## 2014-12-25 MED ORDER — SODIUM CHLORIDE 0.9 % IV SOLN
INTRAVENOUS | Status: DC
  Administered 2014-12-25: 1000 mL via INTRAVENOUS

## 2014-12-25 NOTE — Transfer of Care (Signed)
Immediate Anesthesia Transfer of Care Note  Patient: Deanna Gay  Procedure(s) Performed: Procedure(s): COLONOSCOPY WITH PROPOFOL (N/A) ESOPHAGOGASTRODUODENOSCOPY (EGD) WITH PROPOFOL (N/A)  Patient Location:    Anesthesia Type:General  Level of Consciousness: awake  Airway & Oxygen Therapy: Patient Spontanous Breathing  Post-op Assessment: Report given to RN  Post vital signs: stable  Last Vitals:  Filed Vitals:   12/25/14 0920  BP: 152/81  Pulse: 94  Temp: 36.4 C  Resp: 18    Complications: No apparent anesthesia complications

## 2014-12-25 NOTE — Op Note (Signed)
[facility] Gastroenterology Patient Name: Deanna Gay Procedure Date: 12/25/2014 8:33 AM MRN: [id number] Account #: [id number] Date of Birth: 1956-04-30 Admit Type: Outpatient Age: 58 Room: [facility] ENDO ROOM 4 Gender: Female Note Status: Finalized Procedure:         Upper GI endoscopy Indications:       Anemia, Anorexia, Weight loss Patient Profile:   This is a 59 year old female. Providers:         Gerrit Heck. Rayann Heman, MD Referring MD:      Ocie Cornfield. Ouida Sills, MD (Referring MD) Medicines:         Propofol per Anesthesia Complications:     No immediate complications. Procedure:         Pre-Anesthesia Assessment:                    - Prior to the procedure, a History and Physical was                     performed, and patient medications, allergies and                     sensitivities were reviewed. The patient's tolerance of                     previous anesthesia was reviewed.                    After obtaining informed consent, the endoscope was passed                     under direct vision. Throughout the procedure, the                     patient's blood pressure, pulse, and oxygen saturations                     were monitored continuously. The Endoscope was introduced                     through the mouth, and advanced to the second part of                     duodenum. The upper GI endoscopy was accomplished with                     ease. The patient tolerated the procedure well. Findings:      White nummular lesions were noted in the middle third of the esophagus       and in the lower third of the esophagus. Brushings were obtained in the       lower third of the esophagus for KOH prep. Biopsies were taken with a       cold forceps for histology.      The stomach was normal.      The examined duodenum was normal. Impression:        - White nummular lesions in esophageal mucosa. Biopsied.                    - Normal stomach.                    -  Normal examined duodenum. Recommendation:    - Perform a colonoscopy today.                    -  Await pathology results.                    - Continue present medications.                    - Treat candida if brushings positive. Evaluate for causes                     of candida esophagus.                    - The findings and recommendations were discussed with the                     patient.                    - The findings and recommendations were discussed with the                     patient's family. Procedure Code(s): --- Professional ---                    [phone], Esophagogastroduodenoscopy, flexible, transoral;                     with biopsy, single or multiple CPT copyright 2014 [employer]. All rights reserved. The codes documented in this report are preliminary and upon coder review may  be revised to meet current compliance requirements. Mellody Life, MD 12/25/2014 8:58:56 AM This report has been signed electronically. Number of Addenda: 0 Note Initiated On: 12/25/2014 8:33 AM      [facility]

## 2014-12-25 NOTE — H&P (Signed)
Primary Care Physician:  Kirk Ruths., MD  Pre-Procedure History & Physical: HPI:  Deanna Gay is a 59 y.o. female is here for an colonoscopy / EGD   Past Medical History  Diagnosis Date  . COPD (chronic obstructive pulmonary disease)   . Schizophrenia   . Arthritis   . Hypertension   . Diabetes mellitus without complication   . Depression   . GERD (gastroesophageal reflux disease)   . Sleep apnea   . Cancer     cervical    Past Surgical History  Procedure Laterality Date  . Joint replacement    . Appendectomy      Prior to Admission medications   Medication Sig Start Date End Date Taking? Authorizing Provider  albuterol (PROVENTIL HFA;VENTOLIN HFA) 108 (90 BASE) MCG/ACT inhaler Inhale 2 puffs into the lungs every 6 (six) hours as needed for wheezing or shortness of breath.   Yes Historical Provider, MD  budesonide-formoterol (SYMBICORT) 160-4.5 MCG/ACT inhaler Inhale 2 puffs into the lungs 2 (two) times daily.   Yes Historical Provider, MD  diltiazem (CARDIZEM CD) 180 MG 24 hr capsule Take 180 mg by mouth daily.   Yes Historical Provider, MD  enalapril (VASOTEC) 20 MG tablet Take 20 mg by mouth daily.   Yes Historical Provider, MD  hydrochlorothiazide (HYDRODIURIL) 25 MG tablet Take 25 mg by mouth daily.   Yes Historical Provider, MD  insulin detemir (LEVEMIR) 100 UNIT/ML injection Inject 15 Units into the skin at bedtime.   Yes Historical Provider, MD  ipratropium-albuterol (DUONEB) 0.5-2.5 (3) MG/3ML SOLN Take 3 mLs by nebulization every 6 (six) hours as needed.   Yes Historical Provider, MD  metFORMIN (GLUCOPHAGE) 1000 MG tablet Take 1,000 mg by mouth 2 (two) times daily with a meal.   Yes Historical Provider, MD  Naloxone HCl (EVZIO) 0.4 MG/0.4ML SOAJ Inject as directed.   Yes Historical Provider, MD  omeprazole (PRILOSEC) 20 MG capsule Take 20 mg by mouth daily.   Yes Historical Provider, MD  oxyCODONE-acetaminophen (PERCOCET) 7.5-325 MG per tablet Take 1  tablet by mouth every 6 (six) hours as needed for severe pain.   Yes Historical Provider, MD  risperiDONE (RISPERDAL) 1 MG tablet Take 1 mg by mouth at bedtime.   Yes Historical Provider, MD  triamcinolone cream (KENALOG) 0.1 % Apply 1 application topically 2 (two) times daily.   Yes Historical Provider, MD    Allergies as of 12/13/2014  . (Not on File)    History reviewed. No pertinent family history.  History   Social History  . Marital Status: Single    Spouse Name: N/A  . Number of Children: N/A  . Years of Education: N/A   Occupational History  . Not on file.   Social History Main Topics  . Smoking status: Current Every Day Smoker -- 1.00 packs/day  . Smokeless tobacco: Never Used  . Alcohol Use: No  . Drug Use: No  . Sexual Activity: Not on file   Other Topics Concern  . Not on file   Social History Narrative     Physical Exam: BP 130/76 mmHg  Pulse 90  Temp(Src) 97.8 F (36.6 C) (Tympanic)  Resp 20  Ht 5' 4"  (1.626 m)  Wt 92.534 kg (204 lb)  BMI 35.00 kg/m2  SpO2 96%  LMP  (LMP Unknown) General:   Alert,  pleasant and cooperative in NAD Head:  Normocephalic and atraumatic. Neck:  Supple; no masses or thyromegaly. Lungs:  Clear throughout to auscultation.  Heart:  Regular rate and rhythm. Abdomen:  Soft, nontender and nondistended. Normal bowel sounds, without guarding, and without rebound.   Neurologic:  Alert and  oriented x4;  grossly normal neurologically.  Impression/Plan: Deanna Gay is here for an colonoscopy /EGD to be performed for screening and anorexia, wt loss, GERD  Risks, benefits, limitations, and alternatives regarding  Colonoscopy  / EGD have been reviewed with the patient.  Questions have been answered.  All parties agreeable.   Deanna Class, MD  12/25/2014, 8:42 AM

## 2014-12-25 NOTE — Op Note (Signed)
[facility] Patient Name: Barrie Sigmund Procedure Date: 12/25/2014 8:32 AM MRN: [id number] Account #: [id number] Date of Birth: [birth year] Admit Type: Outpatient Age: 59 Room: [facility] ENDO ROOM 4 Gender: Female Note Status: Finalized Procedure:         Colonoscopy Indications:       Screening for colorectal malignant neoplasm, This is the                     patient's first colonoscopy Patient Profile:   This is a 59 year old female. Providers:         Gerrit Heck. Rayann Heman, MD Referring MD:      Ocie Cornfield. Ouida Sills, MD (Referring MD) Medicines:         Propofol per Anesthesia Complications:     No immediate complications. Estimated blood loss: None. Procedure:         Pre-Anesthesia Assessment:                    - Prior to the procedure, a History and Physical was                     performed, and patient medications, allergies and                     sensitivities were reviewed. The patient's tolerance of                     previous anesthesia was reviewed.                    After obtaining informed consent, the colonoscope was                     passed under direct vision. Throughout the procedure, the                     patient's blood pressure, pulse, and oxygen saturations                     were monitored continuously. The Colonoscope was                     introduced through the anus and advanced to the the cecum,                     identified by appendiceal orifice and ileocecal valve. The                     colonoscopy was performed without difficulty. The patient                     tolerated the procedure well. The quality of the bowel                     preparation was fair. Findings:      The perianal and digital rectal examinations were normal.      Many small and large-mouthed diverticula were found in the sigmoid colon.      The exam was otherwise without abnormality on direct and retroflexion       views. Impression:         - Diverticulosis in the sigmoid colon.                    - The examination was otherwise normal  on direct and                     retroflexion views.                    - No specimens collected. Recommendation:    - Observe patient in GI recovery unit.                    - High fiber diet.                    - Repeat colonoscopy in 5 years for screening purposes.                     (fair prep only)                    - Return to GI clinic. Consider CT a/p for the weight                     loss, anorexia, anemia.                    - The findings and recommendations were discussed with the                     patient.                    - The findings and recommendations were discussed with the                     patient's family. Procedure Code(s): --- Professional ---                    [phone], Colonoscopy, flexible; diagnostic, including                     collection of specimen(s) by brushing or washing, when                     performed (separate procedure) CPT copyright 2014 American [employer]. All rights reserved. The codes documented in this report are preliminary and upon coder review may  be revised to meet current compliance requirements. Mellody Life, MD 12/25/2014 9:20:24 AM This report has been signed electronically. Number of Addenda: 0 Note Initiated On: 12/25/2014 8:32 AM Scope Withdrawal Time: 0 hours 11 minutes 45 seconds  Total Procedure Duration: 0 hours 14 minutes 24 seconds       [facility]

## 2014-12-25 NOTE — Anesthesia Preprocedure Evaluation (Signed)
Anesthesia Evaluation  Patient identified by MRN, date of birth, ID band Patient awake    Reviewed: Allergy & Precautions, H&P , NPO status , Patient's Chart, lab work & pertinent test results, reviewed documented beta blocker date and time   Airway Mallampati: I  TM Distance: >3 FB Neck ROM: full    Dental no notable dental hx. (+) Edentulous Upper, Edentulous Lower, Upper Dentures, Lower Dentures   Pulmonary sleep apnea and Continuous Positive Airway Pressure Ventilation , COPD (O2 at night) COPD inhaler and oxygen dependent, Current Smoker,  breath sounds clear to auscultation  Pulmonary exam normal       Cardiovascular Exercise Tolerance: Good hypertension, - angina- CAD, - Past MI and - CABG Normal cardiovascular exam- dysrhythmias - Valvular Problems/MurmursRhythm:regular Rate:Normal     Neuro/Psych PSYCHIATRIC DISORDERS (Schizophrenia and depression) negative neurological ROS  negative psych ROS   GI/Hepatic Neg liver ROS, GERD-  Medicated,  Endo/Other  diabetes, Well Controlled, Insulin Dependent, Oral Hypoglycemic Agents  Renal/GU negative Renal ROS  negative genitourinary   Musculoskeletal   Abdominal   Peds  Hematology negative hematology ROS (+)   Anesthesia Other Findings Past Medical History:   COPD (chronic obstructive pulmonary disease)                 Schizophrenia                                                Arthritis                                                    Hypertension                                                 Diabetes mellitus without complication                       Depression                                                   GERD (gastroesophageal reflux disease)                       Sleep apnea                                                  Cancer                                                         Comment:cervical   Reproductive/Obstetrics negative OB ROS  Anesthesia Physical Anesthesia Plan  ASA: III  Anesthesia Plan: General   Post-op Pain Management:    Induction:   Airway Management Planned:   Additional Equipment:   Intra-op Plan:   Post-operative Plan:   Informed Consent: I have reviewed the patients History and Physical, chart, labs and discussed the procedure including the risks, benefits and alternatives for the proposed anesthesia with the patient or authorized representative who has indicated his/her understanding and acceptance.   Dental Advisory Given  Plan Discussed with: Anesthesiologist, CRNA and Surgeon  Anesthesia Plan Comments:         Anesthesia Quick Evaluation

## 2014-12-25 NOTE — Discharge Instructions (Signed)

## 2014-12-25 NOTE — Anesthesia Postprocedure Evaluation (Signed)
  Anesthesia Post-op Note  Patient: Deanna Gay  Procedure(s) Performed: Procedure(s): COLONOSCOPY WITH PROPOFOL (N/A) ESOPHAGOGASTRODUODENOSCOPY (EGD) WITH PROPOFOL (N/A)  Anesthesia type:General  Patient location: PACU  Post pain: Pain level controlled  Post assessment: Post-op Vital signs reviewed, Patient's Cardiovascular Status Stable, Respiratory Function Stable, Patent Airway and No signs of Nausea or vomiting  Post vital signs: Reviewed and stable  Last Vitals:  Filed Vitals:   12/25/14 0950  BP: 143/81  Pulse:   Temp:   Resp: 20    Level of consciousness: awake, alert  and patient cooperative  Complications: No apparent anesthesia complications

## 2014-12-26 ENCOUNTER — Encounter: Payer: Self-pay | Admitting: Gastroenterology

## 2014-12-26 LAB — SURGICAL PATHOLOGY

## 2014-12-27 ENCOUNTER — Other Ambulatory Visit: Payer: Self-pay | Admitting: Gastroenterology

## 2014-12-27 DIAGNOSIS — D649 Anemia, unspecified: Secondary | ICD-10-CM

## 2014-12-27 DIAGNOSIS — R634 Abnormal weight loss: Secondary | ICD-10-CM

## 2014-12-28 LAB — SPECIMEN STATUS REPORT

## 2015-01-01 ENCOUNTER — Ambulatory Visit
Admission: RE | Admit: 2015-01-01 | Discharge: 2015-01-01 | Disposition: A | Discharge status: Home / Self Care | Payer: Medicare Other | Source: Ambulatory Visit | Attending: Gastroenterology | Admitting: Gastroenterology

## 2015-01-01 ENCOUNTER — Encounter: Payer: Self-pay | Admitting: Gastroenterology

## 2015-01-01 DIAGNOSIS — D649 Anemia, unspecified: Secondary | ICD-10-CM

## 2015-01-01 DIAGNOSIS — R634 Abnormal weight loss: Secondary | ICD-10-CM | POA: Insufficient documentation

## 2015-01-01 LAB — FUNGUS STAIN

## 2015-01-01 MED ORDER — IOHEXOL 300 MG/ML  SOLN
100.0000 mL | Freq: Once | INTRAMUSCULAR | Status: AC | PRN
  Administered 2015-01-01: 100 mL via INTRAVENOUS

## 2015-01-05 NOTE — Addendum Note (Signed)
Addendum  created 01/05/15 2040 by Martha Clan, MD   Modules edited: Anesthesia Attestations

## 2015-01-09 ENCOUNTER — Ambulatory Visit
Admission: RE | Admit: 2015-01-09 | Discharge: 2015-01-09 | Disposition: A | Discharge status: Home / Self Care | Payer: Medicare Other | Source: Ambulatory Visit | Attending: Gastroenterology | Admitting: Gastroenterology

## 2015-01-09 DIAGNOSIS — Z96641 Presence of right artificial hip joint: Secondary | ICD-10-CM | POA: Diagnosis not present

## 2015-01-09 DIAGNOSIS — R634 Abnormal weight loss: Secondary | ICD-10-CM | POA: Insufficient documentation

## 2015-01-09 DIAGNOSIS — D649 Anemia, unspecified: Secondary | ICD-10-CM | POA: Diagnosis present

## 2015-02-26 ENCOUNTER — Encounter: Payer: Self-pay | Admitting: *Deleted

## 2015-02-26 ENCOUNTER — Inpatient Hospital Stay
Admission: EM | Admit: 2015-02-26 | Discharge: 2015-02-28 | DRG: 812 | Disposition: A | Discharge status: Home / Self Care | Payer: Medicare Other | Attending: Internal Medicine | Admitting: Internal Medicine

## 2015-02-26 DIAGNOSIS — Z8249 Family history of ischemic heart disease and other diseases of the circulatory system: Secondary | ICD-10-CM | POA: Diagnosis not present

## 2015-02-26 DIAGNOSIS — K921 Melena: Secondary | ICD-10-CM | POA: Diagnosis present

## 2015-02-26 DIAGNOSIS — Z888 Allergy status to other drugs, medicaments and biological substances status: Secondary | ICD-10-CM | POA: Diagnosis not present

## 2015-02-26 DIAGNOSIS — D500 Iron deficiency anemia secondary to blood loss (chronic): Secondary | ICD-10-CM | POA: Diagnosis present

## 2015-02-26 DIAGNOSIS — Z96641 Presence of right artificial hip joint: Secondary | ICD-10-CM | POA: Diagnosis present

## 2015-02-26 DIAGNOSIS — Z79899 Other long term (current) drug therapy: Secondary | ICD-10-CM

## 2015-02-26 DIAGNOSIS — N029 Recurrent and persistent hematuria with unspecified morphologic changes: Secondary | ICD-10-CM | POA: Diagnosis present

## 2015-02-26 DIAGNOSIS — J449 Chronic obstructive pulmonary disease, unspecified: Secondary | ICD-10-CM | POA: Diagnosis present

## 2015-02-26 DIAGNOSIS — D62 Acute posthemorrhagic anemia: Secondary | ICD-10-CM

## 2015-02-26 DIAGNOSIS — F17200 Nicotine dependence, unspecified, uncomplicated: Secondary | ICD-10-CM | POA: Diagnosis present

## 2015-02-26 DIAGNOSIS — F329 Major depressive disorder, single episode, unspecified: Secondary | ICD-10-CM | POA: Diagnosis present

## 2015-02-26 DIAGNOSIS — Z833 Family history of diabetes mellitus: Secondary | ICD-10-CM

## 2015-02-26 DIAGNOSIS — B3781 Candidal esophagitis: Secondary | ICD-10-CM | POA: Diagnosis present

## 2015-02-26 DIAGNOSIS — G4733 Obstructive sleep apnea (adult) (pediatric): Secondary | ICD-10-CM | POA: Diagnosis present

## 2015-02-26 DIAGNOSIS — F209 Schizophrenia, unspecified: Secondary | ICD-10-CM | POA: Diagnosis present

## 2015-02-26 DIAGNOSIS — K219 Gastro-esophageal reflux disease without esophagitis: Secondary | ICD-10-CM | POA: Diagnosis present

## 2015-02-26 DIAGNOSIS — Z9049 Acquired absence of other specified parts of digestive tract: Secondary | ICD-10-CM | POA: Diagnosis not present

## 2015-02-26 DIAGNOSIS — R55 Syncope and collapse: Secondary | ICD-10-CM

## 2015-02-26 DIAGNOSIS — K920 Hematemesis: Secondary | ICD-10-CM | POA: Diagnosis present

## 2015-02-26 DIAGNOSIS — Z880 Allergy status to penicillin: Secondary | ICD-10-CM

## 2015-02-26 DIAGNOSIS — Z9889 Other specified postprocedural states: Secondary | ICD-10-CM | POA: Diagnosis not present

## 2015-02-26 DIAGNOSIS — D509 Iron deficiency anemia, unspecified: Secondary | ICD-10-CM | POA: Diagnosis present

## 2015-02-26 DIAGNOSIS — K922 Gastrointestinal hemorrhage, unspecified: Secondary | ICD-10-CM

## 2015-02-26 DIAGNOSIS — M1990 Unspecified osteoarthritis, unspecified site: Secondary | ICD-10-CM | POA: Diagnosis present

## 2015-02-26 DIAGNOSIS — E119 Type 2 diabetes mellitus without complications: Secondary | ICD-10-CM | POA: Diagnosis present

## 2015-02-26 DIAGNOSIS — I10 Essential (primary) hypertension: Secondary | ICD-10-CM | POA: Diagnosis present

## 2015-02-26 LAB — COMPREHENSIVE METABOLIC PANEL
ALT: 10 U/L — ABNORMAL LOW (ref 14–54)
AST: 13 U/L — ABNORMAL LOW (ref 15–41)
Albumin: 3 g/dL — ABNORMAL LOW (ref 3.5–5.0)
Alkaline Phosphatase: 70 U/L (ref 38–126)
Anion gap: 7 (ref 5–15)
BUN: 11 mg/dL (ref 6–20)
CO2: 28 mmol/L (ref 22–32)
Calcium: 8 mg/dL — ABNORMAL LOW (ref 8.9–10.3)
Chloride: 104 mmol/L (ref 101–111)
Creatinine, Ser: 0.73 mg/dL (ref 0.44–1.00)
GFR calc Af Amer: >60 mL/min (ref >60)
GFR calc non Af Amer: >60 mL/min (ref >60)
Glucose, Bld: 113 mg/dL — ABNORMAL HIGH (ref 65–99)
Potassium: 3.4 mmol/L — ABNORMAL LOW (ref 3.5–5.1)
Sodium: 139 mmol/L (ref 135–145)
Total Bilirubin: 0.3 mg/dL (ref 0.3–1.2)
Total Protein: 6.3 g/dL — ABNORMAL LOW (ref 6.5–8.1)

## 2015-02-26 LAB — URINALYSIS COMPLETE WITH MICROSCOPIC (ARMC ONLY)
Bilirubin Urine: NEGATIVE
Glucose, UA: NEGATIVE mg/dL
Hgb urine dipstick: NEGATIVE
KETONES UR: NEGATIVE mg/dL
Leukocytes, UA: NEGATIVE
Nitrite: NEGATIVE
PH: 6 (ref 5.0–8.0)
PROTEIN: NEGATIVE mg/dL
Specific Gravity, Urine: 1.008 (ref 1.005–1.030)

## 2015-02-26 LAB — CBC
HCT: 21.3 % — ABNORMAL LOW (ref 35.0–47.0)
Hemoglobin: 6.3 g/dL — ABNORMAL LOW (ref 12.0–16.0)
MCH: 17.6 pg — ABNORMAL LOW (ref 26.0–34.0)
MCHC: 29.7 g/dL — ABNORMAL LOW (ref 32.0–36.0)
MCV: 59.5 fL — ABNORMAL LOW (ref 80.0–100.0)
Platelets: 240 10*3/uL (ref 150–440)
RBC: 3.58 MIL/uL — AB (ref 3.80–5.20)
RDW: 21 % — ABNORMAL HIGH (ref 11.5–14.5)
WBC: 9.5 10*3/uL (ref 3.6–11.0)

## 2015-02-26 LAB — HEMOGLOBIN AND HEMATOCRIT, BLOOD
HEMATOCRIT: 23 % — AB (ref 35.0–47.0)
Hemoglobin: 6.7 g/dL — ABNORMAL LOW (ref 12.0–16.0)

## 2015-02-26 LAB — LIPASE, BLOOD: Lipase: 22 U/L (ref 22–51)

## 2015-02-26 LAB — PREPARE RBC (CROSSMATCH)

## 2015-02-26 LAB — TROPONIN I: Troponin I: <0.03 ng/mL (ref <0.031)

## 2015-02-26 LAB — APTT: APTT: 24 s (ref 24–36)

## 2015-02-26 LAB — PROTIME-INR
INR: 1.12
PROTHROMBIN TIME: 14.6 s (ref 11.4–15.0)

## 2015-02-26 LAB — ABO/RH: ABO/RH(D): O POS

## 2015-02-26 MED ORDER — PANTOPRAZOLE SODIUM 40 MG IV SOLR
40.0000 mg | Freq: Once | INTRAVENOUS | Status: AC
  Administered 2015-02-26: 40 mg via INTRAVENOUS
  Filled 2015-02-26: qty 40

## 2015-02-26 MED ORDER — SODIUM CHLORIDE 0.9 % IV SOLN
10.0000 mL/h | Freq: Once | INTRAVENOUS | Status: AC
  Administered 2015-02-27: 10 mL/h via INTRAVENOUS

## 2015-02-26 MED ORDER — POTASSIUM CHLORIDE 20 MEQ PO PACK
20.0000 meq | PACK | Freq: Once | ORAL | Status: AC
  Administered 2015-02-27: 20 meq via ORAL
  Filled 2015-02-26: qty 1

## 2015-02-26 MED ORDER — SODIUM CHLORIDE 0.9 % IV BOLUS (SEPSIS)
2000.0000 mL | Freq: Once | INTRAVENOUS | Status: AC
Start: 2015-02-26 — End: 2015-02-27
  Administered 2015-02-26: 2000 mL via INTRAVENOUS

## 2015-02-26 NOTE — ED Provider Notes (Signed)
[facility] Emergency Department Provider Note REMINDER - THIS NOTE IS NOT A FINAL MEDICAL RECORD UNTIL IT IS SIGNED. UNTIL THEN, THE CONTENT BELOW MAY REFLECT INFORMATION FROM A DOCUMENTATION TEMPLATE, NOT THE ACTUAL PATIENT VISIT. ____________________________________________  Time seen: Approximately 9:07 PM  I have reviewed the triage vital signs and the nursing notes.   HISTORY  Chief Complaint Near Syncope    HPI Deanna Gay is a 59 y.o. female previous history of COPD, schizophrenia, diabetes, depression.  Patient presents today after gambling, she got up from her chair and reports that she started walking a very lightheaded and had to lay on the floor to not pass out. She denies syncope, though per EMS report witnesses on scene did report that she did go unconscious.  The patient reports that she felt nauseated, vomited once. She reports that now she just feels fatigued. She does report that her blood sugars have been high lately. No fevers or chills, no chest pain or trouble breathing. No numbness or tingling. No weakness in one arm or leg face or trouble speaking.   Past Medical History  Diagnosis Date  . COPD (chronic obstructive pulmonary disease) ([place])   . Schizophrenia ([place])   . Arthritis   . Hypertension   . Diabetes mellitus without complication ([place])   . Depression   . GERD (gastroesophageal reflux disease)   . Sleep apnea   . Cancer ([place])     cervical    There are no active problems to display for this patient.   Past Surgical History  Procedure Laterality Date  . Joint replacement    . Appendectomy    . Colonoscopy with propofol N/A 12/25/2014    Procedure: COLONOSCOPY WITH PROPOFOL;  Surgeon: Josefine Class, MD;  Location: [facility] ENDOSCOPY;  Service: Endoscopy;  Laterality: N/A;  . Esophagogastroduodenoscopy (egd) with propofol N/A 12/25/2014    Procedure: ESOPHAGOGASTRODUODENOSCOPY (EGD) WITH PROPOFOL;  Surgeon: Josefine Class, MD;  Location: [facility] ENDOSCOPY;  Service: Endoscopy;  Laterality: N/A;    Current Outpatient Rx  Name  Route  Sig  Dispense  Refill  . albuterol (PROVENTIL HFA;VENTOLIN HFA) 108 (90 BASE) MCG/ACT inhaler   Inhalation   Inhale 2 puffs into the lungs every 6 (six) hours as needed for wheezing or shortness of breath.         . budesonide-formoterol (SYMBICORT) 160-4.5 MCG/ACT inhaler   Inhalation   Inhale 2 puffs into the lungs 2 (two) times daily.         Marland Kitchen diltiazem (CARDIZEM CD) 180 MG 24 hr capsule   Oral   Take 180 mg by mouth daily.         . enalapril (VASOTEC) 20 MG tablet   Oral   Take 20 mg by mouth daily.         . hydrochlorothiazide (HYDRODIURIL) 25 MG tablet   Oral   Take 25 mg by mouth daily.         . insulin detemir (LEVEMIR) 100 UNIT/ML injection   Subcutaneous   Inject 15 Units into the skin at bedtime.         Marland Kitchen ipratropium-albuterol (DUONEB) 0.5-2.5 (3) MG/3ML SOLN   Nebulization   Take 3 mLs by nebulization every 6 (six) hours as needed.         . meloxicam (MOBIC) 7.5 MG tablet   Oral   Take 1 tablet by mouth 2 (two) times daily.      0   .  metFORMIN (GLUCOPHAGE) 1000 MG tablet   Oral   Take 1,000 mg by mouth 2 (two) times daily with a meal.         . Naloxone HCl (EVZIO) 0.4 MG/0.4ML SOAJ   Injection   Inject as directed.         Marland Kitchen omeprazole (PRILOSEC) 20 MG capsule   Oral   Take 20 mg by mouth daily.         Marland Kitchen oxybutynin (DITROPAN-XL) 10 MG 24 hr tablet   Oral   Take 10 mg by mouth daily.      0   . oxyCODONE-acetaminophen (PERCOCET) 7.5-325 MG per tablet   Oral   Take 1 tablet by mouth every 6 (six) hours as needed for severe pain.         Marland Kitchen risperiDONE (RISPERDAL) 1 MG tablet   Oral   Take 1 mg by mouth at bedtime.         . triamcinolone (KENALOG) 0.1 % paste   Mouth/Throat   Use as directed 1 application in the mouth or throat daily.      1   . triamcinolone cream (KENALOG) 0.1 %    Topical   Apply 1 application topically 2 (two) times daily.           Allergies Iodine and Penicillins  History reviewed. No pertinent family history.  Social History Social History  Substance Use Topics  . Smoking status: Current Every Day Smoker -- 1.00 packs/day  . Smokeless tobacco: Never Used  . Alcohol Use: No    Review of Systems Constitutional: No fever/chills Eyes: No visual changes. ENT: No sore throat. Cardiovascular: Denies chest pain. Respiratory: Denies shortness of breath. Gastrointestinal: No abdominal pain.   No diarrhea.  No constipation. Genitourinary: Negative for dysuria. Musculoskeletal: Negative for back pain. Skin: Negative for rash. Neurological: Negative for headaches, focal weakness or numbness.  10-point ROS otherwise negative.  ____________________________________________   PHYSICAL EXAM:  VITAL SIGNS: ED Triage Vitals  Enc Vitals Group     BP --      Pulse --      Resp --      Temp --      Temp src --      SpO2 02/26/15 2103 97 %     Weight --      Height --      Head Cir --      Peak Flow --      Pain Score --      Pain Loc --      Pain Edu? --      Excl. in [place]? --    Constitutional: Alert and oriented. Slightly somnolent but overall well appearing and in no acute distress. Eyes: Conjunctivae are normal. PERRL. EOMI. Head: Atraumatic. Nose: No congestion/rhinnorhea. Mouth/Throat: Mucous membranes are moist.  Oropharynx non-erythematous. Neck: No stridor.  No cervical spine tenderness. Cardiovascular: Normal rate, regular rhythm. Grossly normal heart sounds.  Good peripheral circulation. Respiratory: Normal respiratory effort.  No retractions. Lungs CTAB. Gastrointestinal: Soft and nontender. No distention. No abdominal bruits. No CVA tenderness. Musculoskeletal: No lower extremity tenderness nor edema.  No joint effusions. Neurologic:  Normal speech and language. No gross focal neurologic deficits are appreciated. No  pronator drift. 5 out of 5 strength in all extremities. Normal cranial nerves. Skin:  Skin is warm, dry and intact. No rash noted. Psychiatric: Mood and affect are normal. Speech and behavior are normal.  ____________________________________________   LABS (all labs  ordered are listed, but only abnormal results are displayed)  Labs Reviewed  CBC - Abnormal; Notable for the following:    RBC 3.58 (*)    Hemoglobin 6.3 (*)    HCT 21.3 (*)    MCV 59.5 (*)    MCH 17.6 (*)    MCHC 29.7 (*)    RDW 21.0 (*)    All other components within normal limits  COMPREHENSIVE METABOLIC PANEL - Abnormal; Notable for the following:    Potassium 3.4 (*)    Glucose, Bld 113 (*)    Calcium 8.0 (*)    Total Protein 6.3 (*)    Albumin 3.0 (*)    AST 13 (*)    ALT 10 (*)    All other components within normal limits  LIPASE, BLOOD  TROPONIN I  URINALYSIS COMPLETEWITH MICROSCOPIC ([facility] ONLY)  HEMOGLOBIN AND HEMATOCRIT, BLOOD  APTT  PROTIME-INR  CBG MONITORING, ED  PREPARE RBC (CROSSMATCH)  TYPE AND SCREEN   ____________________________________________  EKG  ED ECG REPORT I, Simone Tuckey, the attending physician, personally viewed and interpreted this ECG.  Date: 02/26/2015 EKG Time: 2102 Rate: 80 Rhythm: normal sinus rhythm QRS Axis: normal Intervals: normal ST/T Wave abnormalities: normal Conduction Disutrbances: none Narrative Interpretation: unremarkable  ____________________________________________  RADIOLOGY   ____________________________________________   PROCEDURES  Procedure(s) performed: None  Critical Care performed: Yes, see critical care note(s) CRITICAL CARE Performed by: Delman Kitten   Total critical care time: 45  Critical care time was exclusive of separately billable procedures and treating other patients.  Critical care was necessary to treat or prevent imminent or life-threatening deterioration.  Critical care was time spent personally by me on the  following activities: development of treatment plan with patient and/or surrogate as well as nursing, discussions with consultants, evaluation of patient's response to treatment, examination of patient, obtaining history from patient or surrogate, ordering and performing treatments and interventions, ordering and review of laboratory studies, ordering and review of radiographic studies, pulse oximetry and re-evaluation of patient's condition.  Patient presents with acute hypotension with systolic blood pressure less than 80. EMS reports a blood pressure by palpation of 60, fluids initiated and is improved approximately 75. I will hydrate her generously, continue to follow her closely. ____________________________________________   INITIAL IMPRESSION / ASSESSMENT AND PLAN / ED COURSE  Pertinent labs & imaging results that were available during my care of the patient were reviewed by me and considered in my medical decision making (see chart for details).  Patient presents with syncope. Also reports elevated glucose recently. Consideration certainly given to possibility of dehydration due to elevated glucose, rule out DKA/HHN K. Also consider other etiologies such as acute cardiac, anemia, toxic metabolic, etc. No evidence of acute neurologic abnormality, and I do not believe CT head is indicated at this time. We will hydrate the patient generously, await lab tests, and continue to follow her closely clinically.  ----------------------------------------- 9:51 PM on 02/26/2015 -----------------------------------------  Patient's hemoglobin returned at 6.3, severely anemic. I further discussed with the patient and she reports that she's been having blood in her stools suspected by her gastroenterologist for months. She is post have a capsule endoscopy done this Wednesday. She reports that she knows that she is leading at times, then she also notes blood in her urine from time to time. Given the  hypotension on arrival, and hemoglobin less than 7 I discussed the risks benefits and alternatives to blood transfusion with the patient and her mother. Both consent verbally  and via written consent to transfusion.  ----------------------------------------- 9:52 PM on 02/26/2015 -----------------------------------------  Patient awake alert and oriented, blood pressure much improved after fluid resuscitation. Blood pressure currently in the 110s, no distress at this time.  I have ordered 2 units of blood for the patient, we'll admit her to the hospitalist service for ongoing care. I did discuss performing a rectal exam, the patient did not wish was to be done at this time, but she assures me that there is blood in her stool from time to time as well as in her urine. Denies blood in her stool today. ____________________________________________   FINAL CLINICAL IMPRESSION(S) / ED DIAGNOSES  Final diagnoses:  Gastrointestinal bleeding, lower  Acute blood loss anemia  Syncope and collapse      Delman Kitten, MD 02/26/15 2213

## 2015-02-26 NOTE — ED Notes (Signed)
Pt states she keeps having pass out periods, pt c/o cough and cold like S&S, took some OTC cough meds., SBP has been low upon their arrival EMS reports 60/p.  88% O2 sats  Gave 4L/[place] sats up to 97%.  Pt states she fells 'groggy' .  Ems established PIV gave NS about 184m NS.  Pt is A&O x4

## 2015-02-27 ENCOUNTER — Encounter: Payer: Self-pay | Admitting: Internal Medicine

## 2015-02-27 DIAGNOSIS — D509 Iron deficiency anemia, unspecified: Secondary | ICD-10-CM | POA: Diagnosis present

## 2015-02-27 LAB — HEMOGLOBIN: Hemoglobin: 8.5 g/dL — ABNORMAL LOW (ref 12.0–16.0)

## 2015-02-27 LAB — GLUCOSE, CAPILLARY
Glucose-Capillary: 99 mg/dL (ref 65–99)
Glucose-Capillary: 108 mg/dL — ABNORMAL HIGH (ref 65–99)
Glucose-Capillary: 171 mg/dL — ABNORMAL HIGH (ref 65–99)
Glucose-Capillary: 125 mg/dL — ABNORMAL HIGH (ref 65–99)

## 2015-02-27 LAB — HEMOGLOBIN A1C: Hgb A1c MFr Bld: 6.1 % — ABNORMAL HIGH (ref 4.0–6.0)

## 2015-02-27 LAB — PREPARE RBC (CROSSMATCH)

## 2015-02-27 LAB — TSH: TSH: 6.413 u[IU]/mL — ABNORMAL HIGH (ref 0.350–4.500)

## 2015-02-27 MED ORDER — ONDANSETRON HCL 4 MG PO TABS: 4.0000 mg | ORAL_TABLET | Freq: Four times a day (QID) | ORAL | Status: DC | PRN

## 2015-02-27 MED ORDER — ACETAMINOPHEN 325 MG PO TABS: 650.0000 mg | ORAL_TABLET | Freq: Four times a day (QID) | ORAL | Status: DC | PRN

## 2015-02-27 MED ORDER — IPRATROPIUM-ALBUTEROL 0.5-2.5 (3) MG/3ML IN SOLN: 3.0000 mL | Freq: Four times a day (QID) | RESPIRATORY_TRACT | Status: DC | PRN

## 2015-02-27 MED ORDER — ONDANSETRON HCL 4 MG/2ML IJ SOLN: 4.0000 mg | Freq: Four times a day (QID) | INTRAMUSCULAR | Status: DC | PRN

## 2015-02-27 MED ORDER — INSULIN ASPART 100 UNIT/ML ~~LOC~~ SOLN
0.0000 [IU] | Freq: Three times a day (TID) | SUBCUTANEOUS | Status: DC
  Administered 2015-02-27: 2 [IU] via SUBCUTANEOUS
  Administered 2015-02-28 (×2): 1 [IU] via SUBCUTANEOUS
  Filled 2015-02-27 (×2): qty 1
  Filled 2015-02-27: qty 2

## 2015-02-27 MED ORDER — ACETAMINOPHEN 650 MG RE SUPP: 650.0000 mg | Freq: Four times a day (QID) | RECTAL | Status: DC | PRN

## 2015-02-27 MED ORDER — SODIUM CHLORIDE 0.9 % IV SOLN
Freq: Once | INTRAVENOUS | Status: DC
Start: 2015-02-27 — End: 2015-02-27

## 2015-02-27 MED ORDER — ALBUTEROL SULFATE (2.5 MG/3ML) 0.083% IN NEBU: 3.0000 mL | INHALATION_SOLUTION | Freq: Four times a day (QID) | RESPIRATORY_TRACT | Status: DC | PRN

## 2015-02-27 MED ORDER — BENZONATATE 100 MG PO CAPS
200.0000 mg | ORAL_CAPSULE | Freq: Three times a day (TID) | ORAL | Status: DC
  Administered 2015-02-27 – 2015-02-28 (×4): 200 mg via ORAL
  Filled 2015-02-27 (×4): qty 2

## 2015-02-27 MED ORDER — NICOTINE 21 MG/24HR TD PT24
21.0000 mg | MEDICATED_PATCH | Freq: Every day | TRANSDERMAL | Status: DC
  Administered 2015-02-27 – 2015-02-28 (×2): 21 mg via TRANSDERMAL
  Filled 2015-02-27 (×2): qty 1

## 2015-02-27 MED ORDER — TRIAMCINOLONE ACETONIDE 0.1 % EX CREA
1.0000 "application " | TOPICAL_CREAM | Freq: Two times a day (BID) | CUTANEOUS | Status: DC
  Filled 2015-02-27: qty 15

## 2015-02-27 MED ORDER — SODIUM CHLORIDE 0.9 % IJ SOLN
3.0000 mL | Freq: Two times a day (BID) | INTRAMUSCULAR | Status: DC
  Administered 2015-02-27 – 2015-02-28 (×2): 3 mL via INTRAVENOUS

## 2015-02-27 MED ORDER — TRIAMCINOLONE ACETONIDE 0.1 % MT PSTE
1.0000 "application " | PASTE | Freq: Every day | OROMUCOSAL | Status: DC
  Filled 2015-02-27: qty 5

## 2015-02-27 MED ORDER — ENALAPRIL MALEATE 5 MG PO TABS
20.0000 mg | ORAL_TABLET | Freq: Every day | ORAL | Status: DC
  Administered 2015-02-27 – 2015-02-28 (×2): 20 mg via ORAL
  Filled 2015-02-27 (×2): qty 4

## 2015-02-27 MED ORDER — OXYBUTYNIN CHLORIDE ER 5 MG PO TB24
10.0000 mg | ORAL_TABLET | Freq: Every day | ORAL | Status: DC
  Administered 2015-02-28: 10 mg via ORAL
  Filled 2015-02-27 (×2): qty 2

## 2015-02-27 MED ORDER — OXYCODONE-ACETAMINOPHEN 7.5-325 MG PO TABS
1.0000 | ORAL_TABLET | Freq: Four times a day (QID) | ORAL | Status: DC | PRN
  Administered 2015-02-27 – 2015-02-28 (×3): 1 via ORAL
  Filled 2015-02-27 (×3): qty 1

## 2015-02-27 MED ORDER — RISPERIDONE 0.5 MG PO TABS
1.0000 mg | ORAL_TABLET | Freq: Every day | ORAL | Status: DC
  Administered 2015-02-27: 1 mg via ORAL
  Filled 2015-02-27: qty 2

## 2015-02-27 MED ORDER — MELOXICAM 7.5 MG PO TABS
7.5000 mg | ORAL_TABLET | Freq: Two times a day (BID) | ORAL | Status: DC
  Filled 2015-02-27: qty 1

## 2015-02-27 MED ORDER — SODIUM CHLORIDE 0.9 % IV SOLN
INTRAVENOUS | Status: DC
  Administered 2015-02-27 – 2015-02-28 (×3): via INTRAVENOUS

## 2015-02-27 MED ORDER — DOCUSATE SODIUM 100 MG PO CAPS
100.0000 mg | ORAL_CAPSULE | Freq: Two times a day (BID) | ORAL | Status: DC
  Administered 2015-02-27 – 2015-02-28 (×2): 100 mg via ORAL
  Filled 2015-02-27 (×3): qty 1

## 2015-02-27 MED ORDER — DILTIAZEM HCL ER COATED BEADS 180 MG PO CP24
180.0000 mg | ORAL_CAPSULE | Freq: Every day | ORAL | Status: DC
  Administered 2015-02-27: 180 mg via ORAL
  Filled 2015-02-27 (×3): qty 1

## 2015-02-27 MED ORDER — BUDESONIDE-FORMOTEROL FUMARATE 160-4.5 MCG/ACT IN AERO
2.0000 | INHALATION_SPRAY | Freq: Two times a day (BID) | RESPIRATORY_TRACT | Status: DC
  Administered 2015-02-27 – 2015-02-28 (×2): 2 via RESPIRATORY_TRACT
  Filled 2015-02-27: qty 6

## 2015-02-27 MED ORDER — HYDROCHLOROTHIAZIDE 25 MG PO TABS
25.0000 mg | ORAL_TABLET | Freq: Every day | ORAL | Status: DC
  Administered 2015-02-27 – 2015-02-28 (×2): 25 mg via ORAL
  Filled 2015-02-27 (×2): qty 1

## 2015-02-27 MED ORDER — PANTOPRAZOLE SODIUM 40 MG PO TBEC
40.0000 mg | DELAYED_RELEASE_TABLET | Freq: Every day | ORAL | Status: DC
  Administered 2015-02-27 – 2015-02-28 (×2): 40 mg via ORAL
  Filled 2015-02-27 (×2): qty 1

## 2015-02-27 MED ORDER — INSULIN DETEMIR 100 UNIT/ML ~~LOC~~ SOLN
7.0000 [IU] | Freq: Every day | SUBCUTANEOUS | Status: DC
  Administered 2015-02-27: 7 [IU] via SUBCUTANEOUS
  Filled 2015-02-27 (×3): qty 0.07

## 2015-02-27 NOTE — Progress Notes (Signed)
Admission skin assessment with Arman Bogus, RN.

## 2015-02-27 NOTE — Consult Note (Signed)
Pt recently worked up by Dr. Rayann Heman and had EGD and colonoscopy in 12/2014.  On my exam she has some wheezing in expiration.  With a hgb of 6.7 she likely needs a hgb electrophoresis and maybe a hematology consult.  She had a capsule study scheduled for tomorrow at [facility] clinic and this will have to be postponed due to her admission.  I have notified Dr. Rayann Heman and he may do a capsule on Thursday here at the hospital.  My exam show some late respiratory wheezing.

## 2015-02-27 NOTE — H&P (Addendum)
Deanna Gay is an 59 y.o. female.   Chief Complaint: Syncope HPI: The patient presents the emergency department via EMS after an episode of syncope. She reportedly lay down on the floor prior to losing consciousness. There is no report of head injury. The patient denies pain anywhere but bystanders report coffee-ground emesis and loss of continence of bowel. There is no description of her stool but the patient reports seeing blood in her stool for months. She also reports blood in her urine at times. In the emergency department the patient was found to be hypotensive. Laboratory evaluation revealed severe anemia. Emergency department has consented the patient for blood transfusion and called the hospitalist service for admission.  Past Medical History  Diagnosis Date  . COPD (chronic obstructive pulmonary disease) ([place])   . Schizophrenia ([place])   . Arthritis   . Hypertension   . Diabetes mellitus without complication ([place])   . Depression   . GERD (gastroesophageal reflux disease)   . Sleep apnea   . Cancer [facility])     cervical    Past Surgical History  Procedure Laterality Date  . Joint replacement    . Appendectomy    . Colonoscopy with propofol N/A 12/25/2014    Procedure: COLONOSCOPY WITH PROPOFOL;  Surgeon: Josefine Class, MD;  Location: [facility] ENDOSCOPY;  Service: Endoscopy;  Laterality: N/A;  . Esophagogastroduodenoscopy (egd) with propofol N/A 12/25/2014    Procedure: ESOPHAGOGASTRODUODENOSCOPY (EGD) WITH PROPOFOL;  Surgeon: Josefine Class, MD;  Location: [facility] ENDOSCOPY;  Service: Endoscopy;  Laterality: N/A;    Family History  Problem Relation Age of Onset  . Hypertension Mother   . Diabetes Mellitus II Mother    Social History:  reports that she has been smoking.  She has never used smokeless tobacco. She reports that she does not drink alcohol or use illicit drugs.  Allergies:  Allergies  Allergen Reactions  . Iodine   . Penicillins     Prior to Admission  medications   Medication Sig Start Date End Date Taking? Authorizing Provider  albuterol (PROVENTIL HFA;VENTOLIN HFA) 108 (90 BASE) MCG/ACT inhaler Inhale 2 puffs into the lungs every 6 (six) hours as needed for wheezing or shortness of breath.   Yes Historical Provider, MD  budesonide-formoterol (SYMBICORT) 160-4.5 MCG/ACT inhaler Inhale 2 puffs into the lungs 2 (two) times daily.   Yes Historical Provider, MD  diltiazem (CARDIZEM CD) 180 MG 24 hr capsule Take 180 mg by mouth daily.   Yes Historical Provider, MD  enalapril (VASOTEC) 20 MG tablet Take 20 mg by mouth daily.   Yes Historical Provider, MD  hydrochlorothiazide (HYDRODIURIL) 25 MG tablet Take 25 mg by mouth daily.   Yes Historical Provider, MD  insulin detemir (LEVEMIR) 100 UNIT/ML injection Inject 15 Units into the skin at bedtime.   Yes Historical Provider, MD  ipratropium-albuterol (DUONEB) 0.5-2.5 (3) MG/3ML SOLN Take 3 mLs by nebulization every 6 (six) hours as needed.   Yes Historical Provider, MD  meloxicam (MOBIC) 7.5 MG tablet Take 1 tablet by mouth 2 (two) times daily. 02/23/15  Yes Historical Provider, MD  metFORMIN (GLUCOPHAGE) 1000 MG tablet Take 1,000 mg by mouth 2 (two) times daily with a meal.   Yes Historical Provider, MD  Naloxone HCl (EVZIO) 0.4 MG/0.4ML SOAJ Inject as directed.   Yes Historical Provider, MD  omeprazole (PRILOSEC) 20 MG capsule Take 20 mg by mouth daily.   Yes Historical Provider, MD  oxybutynin (DITROPAN-XL) 10 MG 24 hr tablet Take 10 mg  by mouth daily. 02/23/15  Yes Historical Provider, MD  oxyCODONE-acetaminophen (PERCOCET) 7.5-325 MG per tablet Take 1 tablet by mouth every 6 (six) hours as needed for severe pain.   Yes Historical Provider, MD  risperiDONE (RISPERDAL) 1 MG tablet Take 1 mg by mouth at bedtime.   Yes Historical Provider, MD  triamcinolone (KENALOG) 0.1 % paste Use as directed 1 application in the mouth or throat daily. 02/23/15  Yes Historical Provider, MD  triamcinolone cream (KENALOG)  0.1 % Apply 1 application topically 2 (two) times daily.   Yes Historical Provider, MD    Results for orders placed or performed during the hospital encounter of 02/26/15 (from the past 48 hour(s))  CBC     Status: Abnormal   Collection Time: 02/26/15  9:14 PM  Result Value Ref Range   WBC 9.5 3.6 - 11.0 K/uL   RBC 3.58 (L) 3.80 - 5.20 MIL/uL   Hemoglobin 6.3 (L) 12.0 - 16.0 g/dL   HCT 21.3 (L) 35.0 - 47.0 %   MCV 59.5 (L) 80.0 - 100.0 fL   MCH 17.6 (L) 26.0 - 34.0 pg   MCHC 29.7 (L) 32.0 - 36.0 g/dL   RDW 21.0 (H) 11.5 - 14.5 %   Platelets 240 150 - 440 K/uL  Comprehensive metabolic panel     Status: Abnormal   Collection Time: 02/26/15  9:14 PM  Result Value Ref Range   Sodium 139 135 - 145 mmol/L   Potassium 3.4 (L) 3.5 - 5.1 mmol/L   Chloride 104 101 - 111 mmol/L   CO2 28 22 - 32 mmol/L   Glucose, Bld 113 (H) 65 - 99 mg/dL   BUN 11 6 - 20 mg/dL   Creatinine, Ser 0.73 0.44 - 1.00 mg/dL   Calcium 8.0 (L) 8.9 - 10.3 mg/dL   Total Protein 6.3 (L) 6.5 - 8.1 g/dL   Albumin 3.0 (L) 3.5 - 5.0 g/dL   AST 13 (L) 15 - 41 U/L   ALT 10 (L) 14 - 54 U/L   Alkaline Phosphatase 70 38 - 126 U/L   Total Bilirubin 0.3 0.3 - 1.2 mg/dL   GFR calc non Af Amer >60 >60 mL/min   GFR calc Af Amer >60 >60 mL/min    Comment: (NOTE) The eGFR has been calculated using the CKD EPI equation. This calculation has not been validated in all clinical situations. eGFR's persistently <60 mL/min signify possible Chronic Kidney Disease.    Anion gap 7 5 - 15  Lipase, blood     Status: None   Collection Time: 02/26/15  9:14 PM  Result Value Ref Range   Lipase 22 22 - 51 U/L  Troponin I     Status: None   Collection Time: 02/26/15  9:14 PM  Result Value Ref Range   Troponin I <0.03 <0.031 ng/mL    Comment:        NO INDICATION OF MYOCARDIAL INJURY.   Urinalysis complete, with microscopic ([facility] only)     Status: Abnormal   Collection Time: 02/26/15  9:14 PM  Result Value Ref Range   Color, Urine  STRAW (A) YELLOW   APPearance CLEAR (A) CLEAR   Glucose, UA NEGATIVE NEGATIVE mg/dL   Bilirubin Urine NEGATIVE NEGATIVE   Ketones, ur NEGATIVE NEGATIVE mg/dL   Specific Gravity, Urine 1.008 1.005 - 1.030   Hgb urine dipstick NEGATIVE NEGATIVE   pH 6.0 5.0 - 8.0   Protein, ur NEGATIVE NEGATIVE mg/dL   Nitrite NEGATIVE NEGATIVE  Leukocytes, UA NEGATIVE NEGATIVE   RBC / HPF 0-5 0 - 5 RBC/hpf   WBC, UA 0-5 0 - 5 WBC/hpf   Bacteria, UA RARE (A) NONE SEEN   Squamous Epithelial / LPF 0-5 (A) NONE SEEN   Mucous PRESENT   Prepare RBC     Status: None   Collection Time: 02/26/15 10:04 PM  Result Value Ref Range   Order Confirmation ORDER PROCESSED BY BLOOD BANK   Type and screen [place]     Status: None (Preliminary result)   Collection Time: 02/26/15 10:04 PM  Result Value Ref Range   ABO/RH(D) O POS    Antibody Screen NEG    Sample Expiration 03/01/2015    Unit Number [id number]    Blood Component Type RED CELLS,LR    Unit division 00    Status of Unit ALLOCATED    Transfusion Status OK TO TRANSFUSE    Crossmatch Result Compatible    Unit Number [id number]    Blood Component Type RED CELLS,LR    Unit division 00    Status of Unit ALLOCATED    Transfusion Status OK TO TRANSFUSE    Crossmatch Result Compatible    Unit Number [id number]    Blood Component Type RBC LR PHER1    Unit division 00    Status of Unit ALLOCATED    Transfusion Status OK TO TRANSFUSE    Crossmatch Result Compatible    Unit Number [id number]    Blood Component Type RED CELLS,LR    Unit division 00    Status of Unit ALLOCATED    Transfusion Status OK TO TRANSFUSE    Crossmatch Result Compatible   Hemoglobin and hematocrit, blood     Status: Abnormal   Collection Time: 02/26/15 10:04 PM  Result Value Ref Range   Hemoglobin 6.7 (L) 12.0 - 16.0 g/dL    Comment: RESULT REPEATED AND VERIFIED   HCT 23.0 (L) 35.0 - 47.0 %    Comment: RESULT REPEATED AND VERIFIED  APTT      Status: None   Collection Time: 02/26/15 10:04 PM  Result Value Ref Range   aPTT 24 24 - 36 seconds  Protime-INR     Status: None   Collection Time: 02/26/15 10:04 PM  Result Value Ref Range   Prothrombin Time 14.6 11.4 - 15.0 seconds   INR 1.12   ABO/Rh     Status: None   Collection Time: 02/26/15 10:05 PM  Result Value Ref Range   ABO/RH(D) O POS    No results found.  Review of Systems  Constitutional: Negative for fever and chills.  HENT: Negative for sore throat and tinnitus.   Eyes: Negative for blurred vision and redness.  Respiratory: Negative for cough and shortness of breath.   Cardiovascular: Negative for chest pain, palpitations, orthopnea and PND.  Gastrointestinal: Positive for vomiting and blood in stool. Negative for nausea, abdominal pain and diarrhea.  Genitourinary: Negative for dysuria, urgency and frequency.  Musculoskeletal: Negative for myalgias and joint pain.  Skin: Negative for rash.       No lesions  Neurological: Negative for speech change, focal weakness and weakness.  Endo/Heme/Allergies: Does not bruise/bleed easily.       No temperature intolerance  Psychiatric/Behavioral: Negative for depression and suicidal ideas.    Blood pressure 128/65, pulse 81, temperature 98.6 F (37 C), temperature source Oral, resp. rate 18, height 5' 4"  (1.626 m), weight 91.173 kg (201 lb), SpO2 93 %. Physical Exam  Vitals reviewed. Constitutional: She is oriented to person, place, and time. She appears well-developed and well-nourished.  HENT:  Head: Normocephalic and atraumatic.  Mouth/Throat: Oropharynx is clear and moist.  Eyes: Conjunctivae and EOM are normal. Pupils are equal, round, and reactive to light. No scleral icterus.  Neck: Normal range of motion. Neck supple. No JVD present. No tracheal deviation present. No thyromegaly present.  Cardiovascular: Normal rate, regular rhythm and normal heart sounds.  Exam reveals no gallop and no friction rub.   No  murmur heard. Respiratory: Effort normal and breath sounds normal.  GI: Soft. Bowel sounds are normal. She exhibits no distension. There is no tenderness.  Genitourinary:  Deferred  Musculoskeletal: Normal range of motion. She exhibits edema ([place]).  Lymphadenopathy:    She has no cervical adenopathy.  Neurological: She is alert and oriented to person, place, and time. No cranial nerve deficit. She exhibits normal muscle tone.  Skin: Skin is warm and dry. No rash noted. No erythema.  Psychiatric: She has a normal mood and affect. Her behavior is normal. Judgment and thought content normal.     Assessment/Plan This is a 59 year old [place] American female with history of GI bleed admitted for symptomatic anemia secondary to GI bleeding. 1. Anemia: Acute on chronic; currently secondary to GI bleed. The patient has undergone colonoscopy and upper endoscopy in the past. The latter showed candida lesions force the patient's been treated with Diflucan. Reported coffee-ground emesis suggests upper GI bleed. However the patient also reports frank blood in her stool for many months. From outpatient records it appears she has been scheduled for a capsule endoscopy. This may reveal the cause of bleeding. Notably however, the patient reports some formal blood cell dyscrasia and chronic anemia since childhood. Her mother does not recall what form of anemia or hematopoiesis defect the patient had. Transfusion has been ordered. 2. GI bleed: See above; Gastroenterology consulted. Hold meloxicam C 3. Diabetes mellitus type 2: Continue basal insulin dosing adjusted for nothing by mouth diet. Sliding scale insulin while hospitalized. I have held metformin 4. Hypertension: Resume diltiazem, enalapril and hydrochlorothiazide once blood pressure stabilized and patient is volume repleted. 5. COPD: Continue inhalers per outpatient regimen 6. Obstructive sleep apnea: CPAP per home settings 7. Schizophrenia: Continue  Risperdal 8. DVT prophylaxis: SCDs 9. GI prophylaxis: PPI The patient is a full code.  Time spent on admission orders and patient care approximately 35 minutes  Harrie Foreman 02/27/2015, 12:59 AM

## 2015-02-27 NOTE — Progress Notes (Addendum)
[facility] [place] NAME: Deanna Gay    MR#:  [id number]  DATE OF BIRTH:  11-Aug-1955  SUBJECTIVE:  CHIEF COMPLAINT:   Chief Complaint  Patient presents with  . Near Syncope   Feels very hungry. No vomiting, abd pain or diarrhea since admission.  REVIEW OF SYSTEMS:   Review of Systems  Constitutional: Negative for fever.  Respiratory: Negative for shortness of breath.   Cardiovascular: Negative for chest pain and palpitations.  Gastrointestinal: Positive for nausea, vomiting and abdominal pain.  Genitourinary: Negative for dysuria.    DRUG ALLERGIES:   Allergies  Allergen Reactions  . Iodine   . Penicillins     VITALS:  Blood pressure 151/73, pulse 80, temperature 98.6 F (37 C), temperature source Oral, resp. rate 18, height 5' 4"  (1.626 m), weight 92.942 kg (204 lb 14.4 oz), SpO2 93 %.  PHYSICAL EXAMINATION:  GENERAL:  59 y.o.-year-old patient lying in the bed with no acute distress. Very sleepy, obese EYES: Pupils equal, round, reactive to light and accommodation. No scleral icterus. Extraocular muscles intact. exopthalmos HEENT: Head atraumatic, normocephalic. Oropharynx and nasopharynx clear.  NECK:  Supple, no jugular venous distention. No thyroid enlargement, no tenderness.  LUNGS: Normal breath sounds bilaterally, no wheezing, rales,rhonchi or crepitation. No use of accessory muscles of respiration.  CARDIOVASCULAR: S1, S2 normal. No murmurs, rubs, or gallops.  ABDOMEN: Soft, nontender, nondistended. Bowel sounds present. No organomegaly or mass.  EXTREMITIES: No pedal edema, cyanosis, or clubbing.  NEUROLOGIC: Cranial nerves II through XII are intact. Muscle strength 5/5 in all extremities. Sensation intact. Gait not checked.  PSYCHIATRIC: The patient is alert and oriented x 3.  SKIN: No obvious rash, lesion, or ulcer.    LABORATORY PANEL:   CBC  Recent Labs Lab 02/26/15 2114 02/26/15 2204  WBC  9.5  --   HGB 6.3* 6.7*  HCT 21.3* 23.0*  PLT 240  --    ------------------------------------------------------------------------------------------------------------------  Chemistries   Recent Labs Lab 02/26/15 2114  NA 139  K 3.4*  CL 104  CO2 28  GLUCOSE 113*  BUN 11  CREATININE 0.73  CALCIUM 8.0*  AST 13*  ALT 10*  ALKPHOS 70  BILITOT 0.3   ------------------------------------------------------------------------------------------------------------------  Cardiac Enzymes  Recent Labs Lab 02/26/15 2114  TROPONINI <0.03   ------------------------------------------------------------------------------------------------------------------  RADIOLOGY:  No results found.  EKG:   Orders placed or performed during the hospital encounter of 02/26/15  . EKG 12-Lead  . EKG 12-Lead  . ED EKG  . ED EKG    ASSESSMENT AND PLAN:   * syncope - likely due to anemia and GI bleeding - EKG normal  * hematemesis and hematochezia - has had EGD and colonoscopy in 12/2014 showing only diverticulosis and candida esophagitis - capsule study is planned for tomorrow as outpatient but she has been admitted - Gi consultation pending - hold NSAIDs - continue protonix  *anemia - panel in 12/2014 suggestive of iron deficiency with TIBC 630, iron 12 - hgb 8.3 in 11/2014 to 6.3 on admission - s/p transfusion 1 u prbc 6.3 >> 6.7 - transfuse one more unit with goal >7  * candida esophagitis - HIV negative 12/2014 - not sure if this was treated  * Diabetes mellitus type 2:  - check A1C - continue SSI  *  HTN  - controlled on diltiazem, enalapril and hydrochlorothiazide  * COPD:  - no obstruction - Continue inhalers per outpatient regimen  * Obstructive sleep  apnea:  - CPAP per home settings  * Schizophrenia: Continue Risperdal   CODE STATUS: full  TOTAL TIME TAKING CARE OF THIS PATIENT: 35 minutes.  Greater than 50% of time spent in care coordination and  counseling. POSSIBLE D/C IN 1-2 DAYS, DEPENDING ON CLINICAL CONDITION.   Myrtis Ser M.D on 02/27/2015 at 2:25 PM  Between 7am to 6pm - Pager - [phone]  After 6pm go to [email or web address] - password [email or web address]  Office  [phone]  CC: Primary care physician; Kirk Ruths., MD

## 2015-02-27 NOTE — Consult Note (Signed)
GI Inpatient Consult Note  Reason for Consult:  GI Bleed   Attending Requesting Consult:  Dr. Marcille Blanco  History of Present Illness: Deanna Gay is a 59 y.o. female who reports that she started experiencing shortness of breath and dizziness over the last couple of days when doing her daily activities.  She reports she would feel close to passing out and would have to sit down to recover.  She denies syncope, denies vomiting.  She reports she has had abdominal pain, can't describe it, but reports it hurts after she eats and then the pain will not pass on its own.  She reports a few episodes of diarrhea last night, otherwise normal daily bowel movements.  She endorses melena, however says it is not very often.  She is on omeprazole 40 mg daily, she reports her heartburn and acid reflux symptoms are controlled when she takes her medication.  She reports she started taking Mobic approximately 2 months ago for inflammation and pain in her hip.  She is well known in our office.  She was last seen in our clinic on February 15, 2015 by Tammi Klippel, PA-C.  During that visit Ms. Roop had reported that her abdominal pain was much improved, she was continuing with dietary changes, like eating healthier foods and smaller meals, which help prevent her abdominal pain.  She denies any GERD symptoms.  She was continuing to take the Prilosec 40 mg daily along with Bentyl as needed.  She does have a history of anemia and patient continued to note fatigue, weakness, and eating ice daily during that visit.  She denies any GI symptoms such as diarrhea, constipation, hematochezia or melena at that visit.  She also reports persistent hematuria without painful urination or other urinary symptoms, says it may going on for a while, has never been evaluated by Neurology. Was given referral to Urology.  She had a CT of abdomen and pelvis completed on January 09, 2015 with normal results.  Last colonoscopy  was December 25, 2014 with Dr. Rayann Heman.  Impression; sigmoid diverticulosis, otherwise normal.  Repeat in 5 years recommended due to fair prep.  Last upper endoscopy was completed on December 25, 2014 by Dr. Rayann Heman.  White nummular lesions in the esophagus all esophageal mucosa, normal stomach and duodenum.  Pathology positive for Candida.  The plan during that visit was to schedule a capsule endoscopy to be completed on February 28, 2015, if capsule endoscopy was negative for small bowel bleed with referral to Hematology.  Past Medical History:  Past Medical History  Diagnosis Date  . COPD (chronic obstructive pulmonary disease) ([place])   . Schizophrenia ([place])   . Arthritis   . Hypertension   . Diabetes mellitus without complication ([place])   . Depression   . GERD (gastroesophageal reflux disease)   . Sleep apnea   . Cancer [facility])     cervical    Problem List: Patient Active Problem List   Diagnosis Date Noted  . Symptomatic anemia 02/27/2015    Past Surgical History: Past Surgical History  Procedure Laterality Date  . Joint replacement    . Appendectomy    . Colonoscopy with propofol N/A 12/25/2014    Procedure: COLONOSCOPY WITH PROPOFOL;  Surgeon: Josefine Class, MD;  Location: [facility] ENDOSCOPY;  Service: Endoscopy;  Laterality: N/A;  . Esophagogastroduodenoscopy (egd) with propofol N/A 12/25/2014    Procedure: ESOPHAGOGASTRODUODENOSCOPY (EGD) WITH PROPOFOL;  Surgeon: Josefine Class, MD;  Location: [facility] ENDOSCOPY;  Service: Endoscopy;  Laterality: N/A;    Allergies: Allergies  Allergen Reactions  . Iodine   . Penicillins     Home Medications: Prescriptions prior to admission  Medication Sig Dispense Refill Last Dose  . albuterol (PROVENTIL HFA;VENTOLIN HFA) 108 (90 BASE) MCG/ACT inhaler Inhale 2 puffs into the lungs every 6 (six) hours as needed for wheezing or shortness of breath.    at Unknown time  . budesonide-formoterol (SYMBICORT) 160-4.5 MCG/ACT inhaler Inhale 2 puffs into  the lungs 2 (two) times daily.   12/25/2014 at 0800  . diltiazem (CARDIZEM CD) 180 MG 24 hr capsule Take 180 mg by mouth daily.     . enalapril (VASOTEC) 20 MG tablet Take 20 mg by mouth daily.   12/25/2014 at 0600  . hydrochlorothiazide (HYDRODIURIL) 25 MG tablet Take 25 mg by mouth daily.     . insulin detemir (LEVEMIR) 100 UNIT/ML injection Inject 15 Units into the skin at bedtime.     Marland Kitchen ipratropium-albuterol (DUONEB) 0.5-2.5 (3) MG/3ML SOLN Take 3 mLs by nebulization every 6 (six) hours as needed.     . meloxicam (MOBIC) 7.5 MG tablet Take 1 tablet by mouth 2 (two) times daily.  0   . metFORMIN (GLUCOPHAGE) 1000 MG tablet Take 1,000 mg by mouth 2 (two) times daily with a meal.     . Naloxone HCl (EVZIO) 0.4 MG/0.4ML SOAJ Inject as directed.     Marland Kitchen omeprazole (PRILOSEC) 20 MG capsule Take 20 mg by mouth daily.     Marland Kitchen oxybutynin (DITROPAN-XL) 10 MG 24 hr tablet Take 10 mg by mouth daily.  0   . oxyCODONE-acetaminophen (PERCOCET) 7.5-325 MG per tablet Take 1 tablet by mouth every 6 (six) hours as needed for severe pain.     Marland Kitchen risperiDONE (RISPERDAL) 1 MG tablet Take 1 mg by mouth at bedtime.     . triamcinolone (KENALOG) 0.1 % paste Use as directed 1 application in the mouth or throat daily.  1   . triamcinolone cream (KENALOG) 0.1 % Apply 1 application topically 2 (two) times daily.      Home medication reconciliation was completed with the patient.   Scheduled Inpatient Medications:   . benzonatate  200 mg Oral TID  . budesonide-formoterol  2 puff Inhalation BID  . diltiazem  180 mg Oral Daily  . docusate sodium  100 mg Oral BID  . enalapril  20 mg Oral Daily  . hydrochlorothiazide  25 mg Oral Daily  . insulin aspart  0-9 Units Subcutaneous TID WC  . insulin detemir  7 Units Subcutaneous QHS  . nicotine  21 mg Transdermal Daily  . oxybutynin  10 mg Oral Daily  . pantoprazole  40 mg Oral QAC breakfast  . risperiDONE  1 mg Oral QHS  . sodium chloride  3 mL Intravenous Q12H  .  triamcinolone  1 application Mouth/Throat Daily  . triamcinolone cream  1 application Topical BID    Continuous Inpatient Infusions:   . sodium chloride Stopped (02/27/15 0810)    PRN Inpatient Medications:  acetaminophen **OR** acetaminophen, albuterol, ipratropium-albuterol, ondansetron **OR** ondansetron (ZOFRAN) IV, oxyCODONE-acetaminophen  Family History: family history includes Diabetes Mellitus II in her mother; Hypertension in her mother.    Social History:   reports that she has been smoking.  She has never used smokeless tobacco. She reports that she does not drink alcohol or use illicit drugs.   Review of Systems: Constitutional: Weight is stable.  Eyes: No changes in vision. ENT: No oral lesions, sore  throat.  GI: see HPI.  Heme/Lymph: No easy bruising.  CV: No chest pain.  GU: No hematuria.  Integumentary: No rashes.  Neuro: No headaches. Psych: No depression/anxiety.  Endocrine: No heat/cold intolerance.  Allergic/Immunologic: No urticaria.  Resp: No cough. Musculoskeletal: No joint swelling.    Physical Examination: BP 151/73 mmHg  Pulse 80  Temp(Src) 98.6 F (37 C) (Oral)  Resp 18  Ht 5' 4"  (1.626 m)  Wt 92.942 kg (204 lb 14.4 oz)  BMI 35.15 kg/m2  SpO2 93%  LMP  (LMP Unknown) Gen: NAD, alert and oriented x 4.  Reluctant historian. HEENT: PEERLA, EOMI, Neck: supple, no JVD or thyromegaly Chest: CTA bilaterally, no wheezes, crackles, or other adventitious sounds CV: RRR, no m/g/c/r Abd: soft, NT, generalized tenderness, +BS in all four quadrants; no HSM, guarding, ridigity, or rebound tenderness Ext: no edema, well perfused with 2+ pulses, Skin: no rash or lesions noted Lymph: no LAD  Data: Lab Results  Component Value Date   WBC 9.5 02/26/2015   HGB 6.7* 02/26/2015   HCT 23.0* 02/26/2015   MCV 59.5* 02/26/2015   PLT 240 02/26/2015    Recent Labs Lab 02/26/15 2114 02/26/15 2204  HGB 6.3* 6.7*   Lab Results  Component Value Date    NA 139 02/26/2015   K 3.4* 02/26/2015   CL 104 02/26/2015   CO2 28 02/26/2015   BUN 11 02/26/2015   CREATININE 0.73 02/26/2015   Lab Results  Component Value Date   ALT 10* 02/26/2015   AST 13* 02/26/2015   ALKPHOS 70 02/26/2015   BILITOT 0.3 02/26/2015    Recent Labs Lab 02/26/15 2204  APTT 24  INR 1.12   Imaging:  CLINICAL DATA: Microcystic anemia. Weight loss. Recent negative colonoscopy and endoscopy. Weight loss.  EXAM: CT ABDOMEN AND PELVIS WITH CONTRAST  TECHNIQUE: Multidetector CT imaging of the abdomen and pelvis was performed using the standard protocol following bolus administration of intravenous contrast.  CONTRAST: 174m OMNIPAQUE IOHEXOL 300 MG/ML SOLN  COMPARISON: 09/26/2010  FINDINGS: Lower chest: Anterior right lung base volume loss. Mild cardiomegaly, without pericardial or pleural effusion.  Hepatobiliary: Motion degradation in the upper abdomen. Normal liver. Normal gallbladder, without biliary ductal dilatation.  Pancreas: Normal, without mass or ductal dilatation.  Spleen: Normal  Adrenals/Urinary Tract: Normal adrenal glands. Scarring in the upper poles of both kidneys is mild. No hydronephrosis. Degraded evaluation of the pelvis, secondary to beam hardening artifact from right hip arthroplasty. Grossly normal urinary bladder.  Stomach/Bowel: Normal stomach, without wall thickening. Colonic stool burden suggests constipation. Scattered colonic diverticula. Appendectomy. Normal small bowel. Probable surgical clips in the small bowel mesentery.  Vascular/Lymphatic: Aortic and branch vessel atherosclerosis. No abdominopelvic adenopathy.  Reproductive: Grossly normal uterus, without adnexal mass.  Other: No significant free fluid.  Musculoskeletal: Mild to moderate left hip osteoarthritis. Right hip arthroplasty. Degenerative disc disease throughout the lumbosacral spine. Prominent disc bulges at L3-4 and  L4-5.  IMPRESSION: 1. Degradation secondary to motion in the abdomen and beam hardening artifact from right hip arthroplasty in the pelvis. 2. Given these factors, no acute process in the abdomen or pelvis. 3. Possible constipation.   Electronically Signed  By: KAbigail MiyamotoM.D.  On: 01/01/2015  Assessment/Plan: Deanna Gay a 59y.o. female with microcytic anemia.  Hgb 2 mos ago 8.3; 1 mo ago 7.7; 12 days 7.1; admission 6.3, now 6.7.  MCV 59.5 Patient was/is scheduled for a capsule endoscopy tomorrow with Dr. RRayann Hemanfor further evaluation of anemia.  Recommendations: We recommend a hematology consult.  We also recommend hemoglobin electrophoresis.  Dr. Rayann Heman will be consulted on when he is able to proceed with capsule endoscopy this week.  Please see Dr. Percell Boston note for further recommendations.  We will continue to follow with you. Thank you for the consult. Please call with questions or concerns.  Salvadore Farber, PA-C  I personally performed these services.

## 2015-02-28 LAB — BASIC METABOLIC PANEL
Anion gap: 7 (ref 5–15)
BUN: 11 mg/dL (ref 6–20)
CHLORIDE: 104 mmol/L (ref 101–111)
CO2: 30 mmol/L (ref 22–32)
CREATININE: 0.85 mg/dL (ref 0.44–1.00)
Calcium: 8.3 mg/dL — ABNORMAL LOW (ref 8.9–10.3)
GFR calc Af Amer: >60 mL/min (ref >60)
GFR calc non Af Amer: >60 mL/min (ref >60)
GLUCOSE: 147 mg/dL — AB (ref 65–99)
Potassium: 4.1 mmol/L (ref 3.5–5.1)
SODIUM: 141 mmol/L (ref 135–145)

## 2015-02-28 LAB — CBC
HCT: 27.4 % — ABNORMAL LOW (ref 35.0–47.0)
HEMOGLOBIN: 8.5 g/dL — AB (ref 12.0–16.0)
MCH: 19.8 pg — AB (ref 26.0–34.0)
MCHC: 30.9 g/dL — AB (ref 32.0–36.0)
MCV: 64.3 fL — ABNORMAL LOW (ref 80.0–100.0)
Platelets: 238 10*3/uL (ref 150–440)
RBC: 4.27 MIL/uL (ref 3.80–5.20)
RDW: 26 % — ABNORMAL HIGH (ref 11.5–14.5)
WBC: 7.5 10*3/uL (ref 3.6–11.0)

## 2015-02-28 LAB — GLUCOSE, CAPILLARY
Glucose-Capillary: 129 mg/dL — ABNORMAL HIGH (ref 65–99)
Glucose-Capillary: 138 mg/dL — ABNORMAL HIGH (ref 65–99)

## 2015-02-28 LAB — HEMOGLOBIN A1C: Hgb A1c MFr Bld: 5.9 % (ref 4.0–6.0)

## 2015-02-28 LAB — FERRITIN: Ferritin: 2 ng/mL — ABNORMAL LOW (ref 11–307)

## 2015-02-28 MED ORDER — NICOTINE 21 MG/24HR TD PT24: 21.0000 mg | MEDICATED_PATCH | Freq: Every day | TRANSDERMAL | Status: DC

## 2015-02-28 MED ORDER — SODIUM CHLORIDE 0.9 % IV SOLN
200.0000 mg | Freq: Once | INTRAVENOUS | Status: AC
  Administered 2015-02-28: 200 mg via INTRAVENOUS
  Filled 2015-02-28: qty 10

## 2015-02-28 NOTE — Care Management (Signed)
Hgb up to 8.5.  Attending anticipates discharge today if GI clears

## 2015-02-28 NOTE — Discharge Instructions (Addendum)
Stop eating ICE. Stop taking MOBIC. Anemia, Nonspecific Anemia is a condition in which the concentration of red blood cells or hemoglobin in the blood is below normal. Hemoglobin is a substance in red blood cells that carries oxygen to the tissues of the body. Anemia results in not enough oxygen reaching these tissues.  CAUSES  Common causes of anemia include:   Excessive bleeding. Bleeding may be internal or external. This includes excessive bleeding from periods (in women) or from the intestine.   Poor nutrition.   Chronic kidney, thyroid, and liver disease.  Bone marrow disorders that decrease red blood cell production.  Cancer and treatments for cancer.  HIV, AIDS, and their treatments.  Spleen problems that increase red blood cell destruction.  Blood disorders.  Excess destruction of red blood cells due to infection, medicines, and autoimmune disorders. SIGNS AND SYMPTOMS   Minor weakness.   Dizziness.   Headache.  Palpitations.   Shortness of breath, especially with exercise.   Paleness.  Cold sensitivity.  Indigestion.  Nausea.  Difficulty sleeping.  Difficulty concentrating. Symptoms may occur suddenly or they may develop slowly.  DIAGNOSIS  Additional blood tests are often needed. These help your health care provider determine the best treatment. Your health care provider will check your stool for blood and look for other causes of blood loss.  TREATMENT  Treatment varies depending on the cause of the anemia. Treatment can include:   Supplements of iron, vitamin J19, or folic acid.   Hormone medicines.   A blood transfusion. This may be needed if blood loss is severe.   Hospitalization. This may be needed if there is significant continual blood loss.   Dietary changes.  Spleen removal. HOME CARE INSTRUCTIONS Keep all follow-up appointments. It often takes many weeks to correct anemia, and having your health care provider check on  your condition and your response to treatment is very important. SEEK IMMEDIATE MEDICAL CARE IF:   You develop extreme weakness, shortness of breath, or chest pain.   You become dizzy or have trouble concentrating.  You develop heavy vaginal bleeding.   You develop a rash.   You have bloody or black, tarry stools.   You faint.   You vomit up blood.   You vomit repeatedly.   You have abdominal pain.  You have a fever or persistent symptoms for more than 2-3 days.   You have a fever and your symptoms suddenly get worse.   You are dehydrated.  MAKE SURE YOU:  Understand these instructions.  Will watch your condition.  Will get help right away if you are not doing well or get worse.   This information is not intended to replace advice given to you by your health care provider. Make sure you discuss any questions you have with your health care provider.   Document Released: 06/12/2004 Document Revised: 01/05/2013 Document Reviewed: 10/29/2012 Elsevier Interactive Patient Education [employer].

## 2015-02-28 NOTE — Progress Notes (Signed)
Pt. Discharged to home via wc. Discharge instructions and medication regimen reviewed at bedside with patient. Pt. verbalizes understanding of instructions and medication regimen. Prescriptions sent to pharmacy, patient aware. Patient assessment unchanged from this morning. TELE and IV discontinued per policy.

## 2015-02-28 NOTE — Discharge Summary (Signed)
[place] [facility] [place] NAME: Deanna Gay    MR#:  [id number]  DATE OF BIRTH:  07-31-1955  DATE OF ADMISSION:  02/26/2015 ADMITTING PHYSICIAN: Harrie Foreman, MD  DATE OF DISCHARGE: 02/28/2015  1:35 PM  PRIMARY CARE PHYSICIAN: Kirk Ruths., MD    ADMISSION DIAGNOSIS:  Syncope and collapse [R55] Acute blood loss anemia [D62] Gastrointestinal bleeding, lower [K92.2]  DISCHARGE DIAGNOSIS:  Active Problems:   Symptomatic anemia   SECONDARY DIAGNOSIS:   Past Medical History  Diagnosis Date  . COPD (chronic obstructive pulmonary disease) ([place])   . Schizophrenia ([place])   . Arthritis   . Hypertension   . Diabetes mellitus without complication ([place])   . Depression   . GERD (gastroesophageal reflux disease)   . Sleep apnea   . Cancer [facility])     cervical    HOSPITAL COURSE:   1. Severe iron deficiency anemia. Pica ice eating. Patient was transfused on a hemoglobin of 6.7. Hemoglobin remained stable at 8.5 despite given 100 mL per hour of IV fluid hydration. I added on a ferritin and it turned out to be 2. I gave IV venofer of her prior to discharge. Dr. Rayann Heman did not want oral iron until capsule endoscopy can be done as outpatient. I told the patient to call Dr. Jackalyn [place] office to set this up. Patient is already on omeprazole. I have seen severe iron deficiency anemia with pica ice eating and she was advised not to eat ice. Endoscopy and colonoscopy was done on 12/25/2014 which did not show any source of bleeding. Also advised to stop taking meloxicam. Case discussed with Dr. Rayann Heman gastroenterology prior to discharge. 2. Syncope likely with anemia 3. COPD- respiratory status stable 4. Diabetes mellitus type 2 without complication- continue Lantus 5. History of depression- no changes in psychiatric medication 6. Essential hypertension- no changes in medication  DISCHARGE CONDITIONS:   Satisfactory  CONSULTS OBTAINED:   Treatment Team:  Manya Silvas, MD  DRUG ALLERGIES:   Allergies  Allergen Reactions  . Iodine   . Penicillins     DISCHARGE MEDICATIONS:   Discharge Medication List as of 02/28/2015 12:47 PM    START taking these medications   Details  nicotine (NICODERM CQ - DOSED IN MG/24 HOURS) 21 mg/24hr patch Place 1 patch (21 mg total) onto the skin daily., Starting 02/28/2015, Until Discontinued, Normal      CONTINUE these medications which have NOT CHANGED   Details  albuterol (PROVENTIL HFA;VENTOLIN HFA) 108 (90 BASE) MCG/ACT inhaler Inhale 2 puffs into the lungs every 6 (six) hours as needed for wheezing or shortness of breath., Until Discontinued, Historical Med    budesonide-formoterol (SYMBICORT) 160-4.5 MCG/ACT inhaler Inhale 2 puffs into the lungs 2 (two) times daily., Until Discontinued, Historical Med    diltiazem (CARDIZEM CD) 180 MG 24 hr capsule Take 180 mg by mouth daily., Until Discontinued, Historical Med    enalapril (VASOTEC) 20 MG tablet Take 20 mg by mouth daily., Until Discontinued, Historical Med    hydrochlorothiazide (HYDRODIURIL) 25 MG tablet Take 25 mg by mouth daily., Until Discontinued, Historical Med    insulin detemir (LEVEMIR) 100 UNIT/ML injection Inject 15 Units into the skin at bedtime., Until Discontinued, Historical Med    ipratropium-albuterol (DUONEB) 0.5-2.5 (3) MG/3ML SOLN Take 3 mLs by nebulization every 6 (six) hours as needed., Until Discontinued, Historical Med    metFORMIN (GLUCOPHAGE) 1000 MG tablet Take 1,000 mg by mouth 2 (two) times  daily with a meal., Until Discontinued, Historical Med    Naloxone HCl (EVZIO) 0.4 MG/0.4ML SOAJ Inject as directed., Until Discontinued, Historical Med    omeprazole (PRILOSEC) 20 MG capsule Take 20 mg by mouth daily., Until Discontinued, Historical Med    oxybutynin (DITROPAN-XL) 10 MG 24 hr tablet Take 10 mg by mouth daily., Starting 02/23/2015, Until Discontinued, Historical Med     oxyCODONE-acetaminophen (PERCOCET) 7.5-325 MG per tablet Take 1 tablet by mouth every 6 (six) hours as needed for severe pain., Until Discontinued, Historical Med    risperiDONE (RISPERDAL) 1 MG tablet Take 1 mg by mouth at bedtime., Until Discontinued, Historical Med    triamcinolone (KENALOG) 0.1 % paste Use as directed 1 application in the mouth or throat daily., Starting 02/23/2015, Until Discontinued, Historical Med    triamcinolone cream (KENALOG) 0.1 % Apply 1 application topically 2 (two) times daily., Until Discontinued, Historical Med      STOP taking these medications     meloxicam (MOBIC) 7.5 MG tablet          DISCHARGE INSTRUCTIONS:   Follow-up with Dr. Rayann Heman to set up Endoscopy As Outpatient.  If you experience worsening of your admission symptoms, develop shortness of breath, life threatening emergency, suicidal or homicidal thoughts you must seek medical attention immediately by calling 911 or calling your MD immediately  if symptoms less severe.  You Must read complete instructions/literature along with all the possible adverse reactions/side effects for all the Medicines you take and that have been prescribed to you. Take any new Medicines after you have completely understood and accept all the possible adverse reactions/side effects.   Please note  You were cared for by a hospitalist during your hospital stay. If you have any questions about your discharge medications or the care you received while you were in the hospital after you are discharged, you can call the unit and asked to speak with the hospitalist on call if the hospitalist that took care of you is not available. Once you are discharged, your primary care physician will handle any further medical issues. Please note that NO REFILLS for any discharge medications will be authorized once you are discharged, as it is imperative that you return to your primary care physician (or establish a relationship with a  primary care physician if you do not have one) for your aftercare needs so that they can reassess your need for medications and monitor your lab values.    Today   CHIEF COMPLAINT:   Chief Complaint  Patient presents with  . Near Syncope    HISTORY OF PRESENT ILLNESS:  Deanna Gay  is a 59 y.o. female presented with near syncope and found to have a low hemoglobin of 6.7.   VITAL SIGNS:  Blood pressure 172/77, pulse 91, temperature 97.8 F (36.6 C), temperature source Oral, resp. rate 18, height 5' 4"  (1.626 m), weight 93.532 kg (206 lb 3.2 oz), SpO2 85 %.    PHYSICAL EXAMINATION:  GENERAL:  59 y.o.-year-old patient lying in the bed with no acute distress.  EYES: Pupils equal, round, reactive to light and accommodation. No scleral icterus. Extraocular muscles intact.  HEENT: Head atraumatic, normocephalic. Oropharynx and nasopharynx clear.  NECK:  Supple, no jugular venous distention. No thyroid enlargement, no tenderness.  LUNGS: Normal breath sounds bilaterally, no wheezing, rales,rhonchi or crepitation. No use of accessory muscles of respiration.  CARDIOVASCULAR: S1, S2 normal. No murmurs, rubs, or gallops.  ABDOMEN: Soft, non-tender, non-distended. Bowel sounds present. No  organomegaly or mass.  EXTREMITIES: No pedal edema, cyanosis, or clubbing.  NEUROLOGIC: Cranial nerves II through XII are intact. Muscle strength 5/5 in all extremities. Sensation intact. Gait not checked.  PSYCHIATRIC: The patient is alert and oriented x 3.  SKIN: No obvious rash, lesion, or ulcer.   DATA REVIEW:   CBC  Recent Labs Lab 02/28/15 0354  WBC 7.5  HGB 8.5*  HCT 27.4*  PLT 238    Chemistries   Recent Labs Lab 02/26/15 2114 02/28/15 0354  NA 139 141  K 3.4* 4.1  CL 104 104  CO2 28 30  GLUCOSE 113* 147*  BUN 11 11  CREATININE 0.73 0.85  CALCIUM 8.0* 8.3*  AST 13*  --   ALT 10*  --   ALKPHOS 70  --   BILITOT 0.3  --     Cardiac Enzymes  Recent Labs Lab  02/26/15 2114  TROPONINI <0.03     Management plans discussed with the patient, and she is in agreement.  CODE STATUS: Full code  TOTAL TIME TAKING CARE OF THIS PATIENT: 35 minutes.    Loletha Grayer M.D on 02/28/2015 at 4:56 PM  Between 7am to 6pm - Pager - [phone]  After 6pm go to [email or web address] - password [email or web address] [email or web address]  Office  [phone]  CC: Primary care physician; Kirk Ruths., MD

## 2015-02-28 NOTE — Progress Notes (Signed)
RN spoke with patients pharmacist. Pharmacy confirmed that prescriptions have been sent in and will be ready for pick up.

## 2015-02-28 NOTE — Care Management Important Message (Signed)
Important Message  Patient Details  Name: Deanna Gay MRN: [id number] Date of Birth: 1955-07-19   Medicare Important Message Given:  Yes-second notification given    Katrina Stack, RN 02/28/2015, 1:15 PM

## 2015-03-01 LAB — TYPE AND SCREEN
ABO/RH(D): O POS
ANTIBODY SCREEN: NEGATIVE
UNIT DIVISION: 0
UNIT DIVISION: 0
Unit division: 0
Unit division: 0

## 2015-03-16 ENCOUNTER — Inpatient Hospital Stay: Payer: Medicare Other | Admitting: Oncology

## 2015-04-06 ENCOUNTER — Inpatient Hospital Stay: Payer: Medicare Other | Attending: Oncology | Admitting: Oncology

## 2015-04-06 ENCOUNTER — Other Ambulatory Visit: Payer: Self-pay | Admitting: *Deleted

## 2015-04-06 VITALS — BP 163/83 | HR 86 | Temp 97.5°F | Resp 16 | Wt 211.0 lb

## 2015-04-06 DIAGNOSIS — Z7984 Long term (current) use of oral hypoglycemic drugs: Secondary | ICD-10-CM | POA: Insufficient documentation

## 2015-04-06 DIAGNOSIS — F209 Schizophrenia, unspecified: Secondary | ICD-10-CM | POA: Insufficient documentation

## 2015-04-06 DIAGNOSIS — F329 Major depressive disorder, single episode, unspecified: Secondary | ICD-10-CM | POA: Insufficient documentation

## 2015-04-06 DIAGNOSIS — F17210 Nicotine dependence, cigarettes, uncomplicated: Secondary | ICD-10-CM | POA: Insufficient documentation

## 2015-04-06 DIAGNOSIS — D509 Iron deficiency anemia, unspecified: Secondary | ICD-10-CM | POA: Diagnosis present

## 2015-04-06 DIAGNOSIS — I10 Essential (primary) hypertension: Secondary | ICD-10-CM | POA: Diagnosis not present

## 2015-04-06 DIAGNOSIS — G4730 Sleep apnea, unspecified: Secondary | ICD-10-CM | POA: Diagnosis not present

## 2015-04-06 DIAGNOSIS — E1165 Type 2 diabetes mellitus with hyperglycemia: Secondary | ICD-10-CM | POA: Insufficient documentation

## 2015-04-06 DIAGNOSIS — K219 Gastro-esophageal reflux disease without esophagitis: Secondary | ICD-10-CM | POA: Insufficient documentation

## 2015-04-06 DIAGNOSIS — Z79899 Other long term (current) drug therapy: Secondary | ICD-10-CM | POA: Insufficient documentation

## 2015-04-06 DIAGNOSIS — Z794 Long term (current) use of insulin: Secondary | ICD-10-CM | POA: Insufficient documentation

## 2015-04-06 DIAGNOSIS — J449 Chronic obstructive pulmonary disease, unspecified: Secondary | ICD-10-CM | POA: Insufficient documentation

## 2015-04-06 LAB — CBC
HCT: 33.2 % — ABNORMAL LOW (ref 35.0–47.0)
Hemoglobin: 10.1 g/dL — ABNORMAL LOW (ref 12.0–16.0)
MCH: 21 pg — AB (ref 26.0–34.0)
MCHC: 30.6 g/dL — AB (ref 32.0–36.0)
MCV: 68.7 fL — AB (ref 80.0–100.0)
PLATELETS: 190 10*3/uL (ref 150–440)
RBC: 4.83 MIL/uL (ref 3.80–5.20)
RDW: 30.2 % — AB (ref 11.5–14.5)
WBC: 7.6 10*3/uL (ref 3.6–11.0)

## 2015-04-06 LAB — IRON AND TIBC
Iron: 20 ug/dL — ABNORMAL LOW (ref 28–170)
SATURATION RATIOS: 4 % — AB (ref 10.4–31.8)
TIBC: 515 ug/dL — ABNORMAL HIGH (ref 250–450)
UIBC: 495 ug/dL

## 2015-04-06 LAB — RETICULOCYTES
RBC.: 4.84 MIL/uL (ref 3.80–5.20)
RETIC CT PCT: 1.6 % (ref 0.4–3.1)
Retic Count, Absolute: 77.4 10*3/uL (ref 19.0–183.0)

## 2015-04-06 LAB — LACTATE DEHYDROGENASE: LDH: 137 U/L (ref 98–192)

## 2015-04-06 LAB — FOLATE: FOLATE: 11.1 ng/mL (ref >5.9)

## 2015-04-06 LAB — FERRITIN: Ferritin: 5 ng/mL — ABNORMAL LOW (ref 11–307)

## 2015-04-06 LAB — DAT, POLYSPECIFIC AHG (ARMC ONLY): POLYSPECIFIC AHG TEST: NEGATIVE

## 2015-04-06 NOTE — Progress Notes (Signed)
Patient was seen in the ER with syncope with low hemoglobin then received a blood transfusion.  She is still feeling very fatigued but is dealing with a life altering event of the loss of her son 3 weeks ago that is contributing to her current symptoms.

## 2015-04-07 LAB — VITAMIN B12: VITAMIN B 12: 281 pg/mL (ref 180–914)

## 2015-04-09 LAB — HEMOGLOBINOPATHY EVALUATION
HGB C: 0 %
HGB F QUANT: 0 % (ref 0.0–2.0)
HGB S QUANTITAION: 0 %
Hgb A2 Quant: 1.7 % (ref 0.7–3.1)
Hgb A: 98.3 % — ABNORMAL HIGH (ref 94.0–98.0)

## 2015-04-09 LAB — HAPTOGLOBIN: Haptoglobin: 255 mg/dL — ABNORMAL HIGH (ref 34–200)

## 2015-04-09 LAB — ERYTHROPOIETIN: Erythropoietin: 70 m[IU]/mL — ABNORMAL HIGH (ref 2.6–18.5)

## 2015-04-20 ENCOUNTER — Inpatient Hospital Stay: Payer: Medicare Other

## 2015-04-20 ENCOUNTER — Inpatient Hospital Stay: Payer: Medicare Other | Attending: Oncology | Admitting: Oncology

## 2015-04-20 VITALS — BP 147/81 | HR 82 | Temp 97.6°F | Resp 16

## 2015-04-20 DIAGNOSIS — G4730 Sleep apnea, unspecified: Secondary | ICD-10-CM | POA: Diagnosis not present

## 2015-04-20 DIAGNOSIS — J449 Chronic obstructive pulmonary disease, unspecified: Secondary | ICD-10-CM | POA: Insufficient documentation

## 2015-04-20 DIAGNOSIS — I10 Essential (primary) hypertension: Secondary | ICD-10-CM | POA: Diagnosis not present

## 2015-04-20 DIAGNOSIS — F17210 Nicotine dependence, cigarettes, uncomplicated: Secondary | ICD-10-CM | POA: Diagnosis not present

## 2015-04-20 DIAGNOSIS — Z79899 Other long term (current) drug therapy: Secondary | ICD-10-CM | POA: Insufficient documentation

## 2015-04-20 DIAGNOSIS — K219 Gastro-esophageal reflux disease without esophagitis: Secondary | ICD-10-CM | POA: Insufficient documentation

## 2015-04-20 DIAGNOSIS — E1165 Type 2 diabetes mellitus with hyperglycemia: Secondary | ICD-10-CM | POA: Insufficient documentation

## 2015-04-20 DIAGNOSIS — F329 Major depressive disorder, single episode, unspecified: Secondary | ICD-10-CM | POA: Insufficient documentation

## 2015-04-20 DIAGNOSIS — Z7984 Long term (current) use of oral hypoglycemic drugs: Secondary | ICD-10-CM | POA: Diagnosis not present

## 2015-04-20 DIAGNOSIS — D509 Iron deficiency anemia, unspecified: Secondary | ICD-10-CM | POA: Diagnosis present

## 2015-04-20 DIAGNOSIS — F209 Schizophrenia, unspecified: Secondary | ICD-10-CM | POA: Diagnosis not present

## 2015-04-20 DIAGNOSIS — D649 Anemia, unspecified: Secondary | ICD-10-CM

## 2015-04-20 DIAGNOSIS — Z794 Long term (current) use of insulin: Secondary | ICD-10-CM | POA: Diagnosis not present

## 2015-04-20 MED ORDER — SODIUM CHLORIDE 0.9 % IV SOLN
Freq: Once | INTRAVENOUS | Status: AC
  Administered 2015-04-20: 10:00:00 via INTRAVENOUS
  Filled 2015-04-20: qty 1000

## 2015-04-20 MED ORDER — SODIUM CHLORIDE 0.9 % IV SOLN
510.0000 mg | Freq: Once | INTRAVENOUS | Status: AC
  Administered 2015-04-20: 510 mg via INTRAVENOUS
  Filled 2015-04-20: qty 17

## 2015-04-20 NOTE — Progress Notes (Signed)
Patient here for follow up. Experencing some right shoulder pain .

## 2015-04-21 NOTE — Progress Notes (Signed)
[place]  [phone] [phone]) [phone]  ID: Deanna Gay OB: 10/19/1955  MR#: [id number]  UMP#:[id number]  Patient Care Team: Kirk Ruths, MD as PCP - General (Internal Medicine)  CHIEF COMPLAINT:  Chief Complaint  Patient presents with  . Anemia    follow up    INTERVAL HISTORY: Patient returns to clinic today for further evaluation, discussion of her laboratory work, and initiation of IV Feraheme. She continues to feel weak and fatigued, but otherwise feels well. She has no neurologic complaints. She denies any recent fevers. She has no chest pain or shortness of breath. She denies any nausea, vomiting, constipation, or diarrhea. She has no urinary complaints. Patient ooffers no further specific complaints today.  REVIEW OF SYSTEMS:   Review of Systems  Constitutional: Positive for malaise/fatigue. Negative for weight loss.  Respiratory: Negative.   Cardiovascular: Negative.   Gastrointestinal: Negative.  Negative for blood in stool and melena.  Musculoskeletal: Negative.   Neurological: Positive for weakness.    As per HPI. Otherwise, a complete review of systems is negatve.  PAST MEDICAL HISTORY: Past Medical History  Diagnosis Date  . COPD (chronic obstructive pulmonary disease) ([place])   . Schizophrenia ([place])   . Arthritis   . Hypertension   . Diabetes mellitus without complication ([place])   . Depression   . GERD (gastroesophageal reflux disease)   . Sleep apnea   . Cancer ([place])     cervical    PAST SURGICAL HISTORY: Past Surgical History  Procedure Laterality Date  . Joint replacement    . Appendectomy    . Colonoscopy with propofol N/A 12/25/2014    Procedure: COLONOSCOPY WITH PROPOFOL;  Surgeon: Josefine Class, MD;  Location: [facility] ENDOSCOPY;  Service: Endoscopy;  Laterality: N/A;  . Esophagogastroduodenoscopy (egd) with propofol N/A 12/25/2014    Procedure: ESOPHAGOGASTRODUODENOSCOPY (EGD) WITH PROPOFOL;   Surgeon: Josefine Class, MD;  Location: [facility] ENDOSCOPY;  Service: Endoscopy;  Laterality: N/A;    FAMILY HISTORY Family History  Problem Relation Age of Onset  . Hypertension Mother   . Diabetes Mellitus II Mother        ADVANCED DIRECTIVES:    HEALTH MAINTENANCE: Social History  Substance Use Topics  . Smoking status: Current Every Day Smoker -- 1.00 packs/day  . Smokeless tobacco: Never Used  . Alcohol Use: No     Colonoscopy:  PAP:  Bone density:  Lipid panel:  Allergies  Allergen Reactions  . Iodine   . Penicillins     Current Outpatient Prescriptions  Medication Sig Dispense Refill  . albuterol (PROVENTIL HFA;VENTOLIN HFA) 108 (90 BASE) MCG/ACT inhaler Inhale 2 puffs into the lungs every 6 (six) hours as needed for wheezing or shortness of breath.    . budesonide-formoterol (SYMBICORT) 160-4.5 MCG/ACT inhaler Inhale 2 puffs into the lungs 2 (two) times daily.    Marland Kitchen diltiazem (CARDIZEM CD) 180 MG 24 hr capsule Take 180 mg by mouth daily.    . enalapril (VASOTEC) 20 MG tablet take 1 tablet by mouth twice a day    . hydrochlorothiazide (HYDRODIURIL) 25 MG tablet Take 25 mg by mouth daily.    . insulin detemir (LEVEMIR) 100 UNIT/ML injection Inject 15 Units into the skin at bedtime.    Marland Kitchen ipratropium-albuterol (DUONEB) 0.5-2.5 (3) MG/3ML SOLN Take 3 mLs by nebulization every 6 (six) hours as needed.    . metFORMIN (GLUCOPHAGE) 1000 MG tablet Take 1,000 mg by mouth 2 (two) times daily with a meal.    .  omeprazole (PRILOSEC) 20 MG capsule Take 20 mg by mouth daily.    Marland Kitchen oxyCODONE-acetaminophen (PERCOCET) 7.5-325 MG per tablet Take 1 tablet by mouth every 6 (six) hours as needed for severe pain.    Marland Kitchen risperiDONE (RISPERDAL) 1 MG tablet Take 1 mg by mouth at bedtime.    . triamcinolone (KENALOG) 0.1 % paste Use as directed 1 application in the mouth or throat daily.  1  . triamcinolone cream (KENALOG) 0.1 % Apply 1 application topically 2 (two) times daily.     No  current facility-administered medications for this visit.    OBJECTIVE: There were no vitals filed for this visit.   There is no weight on file to calculate BMI.    ECOG FS:0 - Asymptomatic  General: Well-developed, well-nourished, no acute distress. Eyes: Pink conjunctiva, anicteric sclera. Lungs: Clear to auscultation bilaterally. Heart: Regular rate and rhythm. No rubs, murmurs, or gallops. Abdomen: Soft, nontender, nondistended. No organomegaly noted, normoactive bowel sounds. Musculoskeletal: No edema, cyanosis, or clubbing. Neuro: Alert, answering all questions appropriately. Cranial nerves grossly intact. Skin: No rashes or petechiae noted. Psych: Normal affect.  LAB RESULTS:  Lab Results  Component Value Date   NA 141 02/28/2015   K 4.1 02/28/2015   CL 104 02/28/2015   CO2 30 02/28/2015   GLUCOSE 147* 02/28/2015   BUN 11 02/28/2015   CREATININE 0.85 02/28/2015   CALCIUM 8.3* 02/28/2015   PROT 6.3* 02/26/2015   ALBUMIN 3.0* 02/26/2015   AST 13* 02/26/2015   ALT 10* 02/26/2015   ALKPHOS 70 02/26/2015   BILITOT 0.3 02/26/2015   GFRNONAA >60 02/28/2015   GFRAA >60 02/28/2015    Lab Results  Component Value Date   WBC 7.6 04/06/2015   NEUTROABS 4.1 07/21/2013   HGB 10.1* 04/06/2015   HCT 33.2* 04/06/2015   MCV 68.7* 04/06/2015   PLT 190 04/06/2015     STUDIES: No results found.  ASSESSMENT: Iron deficiency anemia.  PLAN:    1. Iron deficiency anemia: Patient's hemoglobin has improved over the past month, but her iron stores continue to be significantly decreased. The remainder of her blood work is either negative or within normal limits. Proceed with 510 mg IV Feraheme today. Return to clinic in 1 week for a second infusion. Patient will then return to clinic in 3 months with repeat laboratory work and further evaluation. Patient has also been instructed to keep her previously scheduled follow-up appointments with GI. 2. Hyperglycemia: Continue insulin and  diabetic medications as prescribed. 3. Hypertension: Patient's blood pressure is mildly elevated today, continue current medications as prescribed.  Patient expressed understanding and was in agreement with this plan. She also understands that She can call clinic at any time with any questions, concerns, or complaints.    Lloyd Huger, MD   04/21/2015 4:34 PM

## 2015-04-21 NOTE — Progress Notes (Signed)
[place]  [phone] [phone]) [phone]  ID: Deanna Gay OB: 1956/04/05  MR#: [id number]  CWU#:[id number]  Patient Care Team: Kirk Ruths, MD as PCP - General (Internal Medicine)  CHIEF COMPLAINT:  Chief Complaint  Patient presents with  . New Evaluation    anemia    INTERVAL HISTORY: Patient is a 59 year old female who was recently dilated the emergency room with syncope and decreased hemoglobin which required blood transfusion. EGD did not reveal a distinct source of her anemia. Patient continues to feel weak and fatigued, but also admits to loss of her son several weeks ago is contributing to her current symptoms. She has no neurologic complaints. She denies any recent fevers. She has no chest pain or shortness of breath. She denies any nausea, vomiting, constipation, or diarrhea. She has no urinary complaints. Patient otherwise feels well and offers no further specific complaints.  REVIEW OF SYSTEMS:   Review of Systems  Constitutional: Positive for malaise/fatigue. Negative for weight loss.  Respiratory: Negative.   Cardiovascular: Negative.   Gastrointestinal: Negative.  Negative for blood in stool and melena.  Musculoskeletal: Negative.   Neurological: Positive for weakness.    As per HPI. Otherwise, a complete review of systems is negatve.  PAST MEDICAL HISTORY: Past Medical History  Diagnosis Date  . COPD (chronic obstructive pulmonary disease) ([place])   . Schizophrenia ([place])   . Arthritis   . Hypertension   . Diabetes mellitus without complication ([place])   . Depression   . GERD (gastroesophageal reflux disease)   . Sleep apnea   . Cancer ([place])     cervical    PAST SURGICAL HISTORY: Past Surgical History  Procedure Laterality Date  . Joint replacement    . Appendectomy    . Colonoscopy with propofol N/A 12/25/2014    Procedure: COLONOSCOPY WITH PROPOFOL;  Surgeon: Josefine Class, MD;  Location: [facility]  ENDOSCOPY;  Service: Endoscopy;  Laterality: N/A;  . Esophagogastroduodenoscopy (egd) with propofol N/A 12/25/2014    Procedure: ESOPHAGOGASTRODUODENOSCOPY (EGD) WITH PROPOFOL;  Surgeon: Josefine Class, MD;  Location: [facility] ENDOSCOPY;  Service: Endoscopy;  Laterality: N/A;    FAMILY HISTORY Family History  Problem Relation Age of Onset  . Hypertension Mother   . Diabetes Mellitus II Mother        ADVANCED DIRECTIVES:    HEALTH MAINTENANCE: Social History  Substance Use Topics  . Smoking status: Current Every Day Smoker -- 1.00 packs/day  . Smokeless tobacco: Never Used  . Alcohol Use: No     Colonoscopy:  PAP:  Bone density:  Lipid panel:  Allergies  Allergen Reactions  . Iodine   . Penicillins     Current Outpatient Prescriptions  Medication Sig Dispense Refill  . albuterol (PROVENTIL HFA;VENTOLIN HFA) 108 (90 BASE) MCG/ACT inhaler Inhale 2 puffs into the lungs every 6 (six) hours as needed for wheezing or shortness of breath.    . budesonide-formoterol (SYMBICORT) 160-4.5 MCG/ACT inhaler Inhale 2 puffs into the lungs 2 (two) times daily.    Marland Kitchen diltiazem (CARDIZEM CD) 180 MG 24 hr capsule Take 180 mg by mouth daily.    . enalapril (VASOTEC) 20 MG tablet take 1 tablet by mouth twice a day    . hydrochlorothiazide (HYDRODIURIL) 25 MG tablet Take 25 mg by mouth daily.    . insulin detemir (LEVEMIR) 100 UNIT/ML injection Inject 15 Units into the skin at bedtime.    Marland Kitchen ipratropium-albuterol (DUONEB) 0.5-2.5 (3) MG/3ML SOLN Take 3 mLs by  nebulization every 6 (six) hours as needed.    . metFORMIN (GLUCOPHAGE) 1000 MG tablet Take 1,000 mg by mouth 2 (two) times daily with a meal.    . omeprazole (PRILOSEC) 20 MG capsule Take 20 mg by mouth daily.    Marland Kitchen oxyCODONE-acetaminophen (PERCOCET) 7.5-325 MG per tablet Take 1 tablet by mouth every 6 (six) hours as needed for severe pain.    Marland Kitchen risperiDONE (RISPERDAL) 1 MG tablet Take 1 mg by mouth at bedtime.    . triamcinolone (KENALOG)  0.1 % paste Use as directed 1 application in the mouth or throat daily.  1  . triamcinolone cream (KENALOG) 0.1 % Apply 1 application topically 2 (two) times daily.     No current facility-administered medications for this visit.    OBJECTIVE: Filed Vitals:   04/06/15 1213  BP: 163/83  Pulse: 86  Temp: 97.5 F (36.4 C)  Resp: 16     Body mass index is 36.2 kg/(m^2).    ECOG FS:0 - Asymptomatic  General: Well-developed, well-nourished, no acute distress. Eyes: Pink conjunctiva, anicteric sclera. HEENT: Normocephalic, moist mucous membranes, clear oropharnyx. Lungs: Clear to auscultation bilaterally. Heart: Regular rate and rhythm. No rubs, murmurs, or gallops. Abdomen: Soft, nontender, nondistended. No organomegaly noted, normoactive bowel sounds. Musculoskeletal: No edema, cyanosis, or clubbing. Neuro: Alert, answering all questions appropriately. Cranial nerves grossly intact. Skin: No rashes or petechiae noted. Psych: Normal affect. Lymphatics: No cervical, calvicular, axillary or inguinal LAD.   LAB RESULTS:  Lab Results  Component Value Date   NA 141 02/28/2015   K 4.1 02/28/2015   CL 104 02/28/2015   CO2 30 02/28/2015   GLUCOSE 147* 02/28/2015   BUN 11 02/28/2015   CREATININE 0.85 02/28/2015   CALCIUM 8.3* 02/28/2015   PROT 6.3* 02/26/2015   ALBUMIN 3.0* 02/26/2015   AST 13* 02/26/2015   ALT 10* 02/26/2015   ALKPHOS 70 02/26/2015   BILITOT 0.3 02/26/2015   GFRNONAA >60 02/28/2015   GFRAA >60 02/28/2015    Lab Results  Component Value Date   WBC 7.6 04/06/2015   NEUTROABS 4.1 07/21/2013   HGB 10.1* 04/06/2015   HCT 33.2* 04/06/2015   MCV 68.7* 04/06/2015   PLT 190 04/06/2015     STUDIES: No results found.  ASSESSMENT: Iron deficiency anemia.  PLAN:    1. Iron deficiency anemia: Patient's hemoglobin has improved over the past months, but her iron stores continue to be significantly decreased. The remainder of her blood work is either negative or  within normal limits. Patient will return to clinic in 2 weeks for further evaluation, discussion of her laboratory work, and initiation of IV iron. Patient has also been instructed to keep her previously scheduled follow-up appointments with GI. 2. Hyperglycemia: Continue insulin and diabetic medications as prescribed. 3. Hypertension: Patient's blood pressure is mildly elevated today, continue current medications as prescribed.  Patient expressed understanding and was in agreement with this plan. She also understands that She can call clinic at any time with any questions, concerns, or complaints.    Lloyd Huger, MD   04/21/2015 4:30 PM

## 2015-04-27 ENCOUNTER — Inpatient Hospital Stay: Payer: Medicare Other

## 2015-04-27 VITALS — BP 134/62 | HR 83 | Temp 97.6°F | Resp 19

## 2015-04-27 DIAGNOSIS — D649 Anemia, unspecified: Secondary | ICD-10-CM

## 2015-04-27 DIAGNOSIS — D509 Iron deficiency anemia, unspecified: Secondary | ICD-10-CM | POA: Diagnosis not present

## 2015-04-27 MED ORDER — SODIUM CHLORIDE 0.9 % IV SOLN
510.0000 mg | Freq: Once | INTRAVENOUS | Status: AC
  Administered 2015-04-27: 510 mg via INTRAVENOUS
  Filled 2015-04-27: qty 17

## 2015-04-27 MED ORDER — SODIUM CHLORIDE 0.9 % IV SOLN
Freq: Once | INTRAVENOUS | Status: AC
  Administered 2015-04-27: 09:00:00 via INTRAVENOUS
  Filled 2015-04-27: qty 1000

## 2015-07-18 ENCOUNTER — Inpatient Hospital Stay: Payer: Medicare Other

## 2015-07-20 ENCOUNTER — Ambulatory Visit: Payer: Medicare Other | Admitting: Oncology

## 2015-07-25 ENCOUNTER — Inpatient Hospital Stay: Payer: Medicare Other | Attending: Oncology

## 2015-07-25 DIAGNOSIS — F17210 Nicotine dependence, cigarettes, uncomplicated: Secondary | ICD-10-CM | POA: Diagnosis not present

## 2015-07-25 DIAGNOSIS — Z7984 Long term (current) use of oral hypoglycemic drugs: Secondary | ICD-10-CM | POA: Diagnosis not present

## 2015-07-25 DIAGNOSIS — J449 Chronic obstructive pulmonary disease, unspecified: Secondary | ICD-10-CM | POA: Diagnosis not present

## 2015-07-25 DIAGNOSIS — M1990 Unspecified osteoarthritis, unspecified site: Secondary | ICD-10-CM | POA: Insufficient documentation

## 2015-07-25 DIAGNOSIS — I10 Essential (primary) hypertension: Secondary | ICD-10-CM | POA: Insufficient documentation

## 2015-07-25 DIAGNOSIS — Z79899 Other long term (current) drug therapy: Secondary | ICD-10-CM | POA: Diagnosis not present

## 2015-07-25 DIAGNOSIS — G4730 Sleep apnea, unspecified: Secondary | ICD-10-CM | POA: Diagnosis not present

## 2015-07-25 DIAGNOSIS — F209 Schizophrenia, unspecified: Secondary | ICD-10-CM | POA: Diagnosis not present

## 2015-07-25 DIAGNOSIS — D509 Iron deficiency anemia, unspecified: Secondary | ICD-10-CM | POA: Diagnosis present

## 2015-07-25 DIAGNOSIS — Z794 Long term (current) use of insulin: Secondary | ICD-10-CM | POA: Diagnosis not present

## 2015-07-25 DIAGNOSIS — E1165 Type 2 diabetes mellitus with hyperglycemia: Secondary | ICD-10-CM | POA: Diagnosis not present

## 2015-07-25 DIAGNOSIS — K219 Gastro-esophageal reflux disease without esophagitis: Secondary | ICD-10-CM | POA: Insufficient documentation

## 2015-07-25 DIAGNOSIS — F329 Major depressive disorder, single episode, unspecified: Secondary | ICD-10-CM | POA: Diagnosis not present

## 2015-07-25 LAB — CBC WITH DIFFERENTIAL/PLATELET
BASOS ABS: 0.2 10*3/uL — AB (ref 0–0.1)
Basophils Relative: 2 %
Eosinophils Absolute: 0 10*3/uL (ref 0–0.7)
Eosinophils Relative: 1 %
HEMATOCRIT: 39.1 % (ref 35.0–47.0)
HEMOGLOBIN: 12.7 g/dL (ref 12.0–16.0)
LYMPHS PCT: 24 %
Lymphs Abs: 1.9 10*3/uL (ref 1.0–3.6)
MCH: 26.7 pg (ref 26.0–34.0)
MCHC: 32.5 g/dL (ref 32.0–36.0)
MCV: 82.3 fL (ref 80.0–100.0)
MONO ABS: 0.4 10*3/uL (ref 0.2–0.9)
Monocytes Relative: 5 %
NEUTROS ABS: 5.4 10*3/uL (ref 1.4–6.5)
NEUTROS PCT: 68 %
Platelets: 214 10*3/uL (ref 150–440)
RBC: 4.75 MIL/uL (ref 3.80–5.20)
RDW: 19.9 % — AB (ref 11.5–14.5)
WBC: 7.9 10*3/uL (ref 3.6–11.0)

## 2015-07-25 LAB — IRON AND TIBC
Iron: 50 ug/dL (ref 28–170)
SATURATION RATIOS: 14 % (ref 10.4–31.8)
TIBC: 362 ug/dL (ref 250–450)
UIBC: 312 ug/dL

## 2015-07-25 LAB — FERRITIN: FERRITIN: 32 ng/mL (ref 11–307)

## 2015-07-27 ENCOUNTER — Ambulatory Visit: Payer: Medicare Other | Admitting: Oncology

## 2015-08-03 ENCOUNTER — Inpatient Hospital Stay: Payer: Medicare Other

## 2015-08-03 ENCOUNTER — Inpatient Hospital Stay (HOSPITAL_BASED_OUTPATIENT_CLINIC_OR_DEPARTMENT_OTHER): Payer: Medicare Other | Admitting: Oncology

## 2015-08-03 VITALS — BP 146/82 | HR 91 | Temp 96.7°F | Resp 20 | Wt 207.9 lb

## 2015-08-03 DIAGNOSIS — E1165 Type 2 diabetes mellitus with hyperglycemia: Secondary | ICD-10-CM | POA: Diagnosis not present

## 2015-08-03 DIAGNOSIS — F17210 Nicotine dependence, cigarettes, uncomplicated: Secondary | ICD-10-CM

## 2015-08-03 DIAGNOSIS — Z79899 Other long term (current) drug therapy: Secondary | ICD-10-CM

## 2015-08-03 DIAGNOSIS — I10 Essential (primary) hypertension: Secondary | ICD-10-CM | POA: Diagnosis not present

## 2015-08-03 DIAGNOSIS — D509 Iron deficiency anemia, unspecified: Secondary | ICD-10-CM

## 2015-08-03 LAB — URINALYSIS COMPLETE WITH MICROSCOPIC (ARMC ONLY)
Bilirubin Urine: NEGATIVE
Glucose, UA: NEGATIVE mg/dL
KETONES UR: NEGATIVE mg/dL
Nitrite: POSITIVE — AB
Protein, ur: NEGATIVE mg/dL
SPECIFIC GRAVITY, URINE: 1.015 (ref 1.005–1.030)
pH: 6 (ref 5.0–8.0)

## 2015-08-03 NOTE — Progress Notes (Signed)
Patient here today for follow up regarding anemia. Patient denies any concerns today.

## 2015-08-05 LAB — URINE CULTURE

## 2015-08-19 NOTE — Progress Notes (Signed)
[place]  Telephone:(336) [phone] Fax:(336) [phone]  ID: Deanna Gay OB: 10-11-1955  MR#: [id number]  FYB#:[id number]  Patient Care Team: Kirk Ruths, MD as PCP - General (Internal Medicine)  CHIEF COMPLAINT:  Chief Complaint  Patient presents with  . Anemia    follow up    INTERVAL HISTORY: Patient returns to clinic today for further evaluation and laboratory work. She currently feels well and is asymptomatic. She has no neurologic complaints. She denies any recent fevers. She has no chest pain or shortness of breath. She denies any nausea, vomiting, constipation, or diarrhea. She does complain of urinary frequency. Patient offers no further specific complaints today.  REVIEW OF SYSTEMS:   Review of Systems  Constitutional: Positive for malaise/fatigue. Negative for weight loss.  Respiratory: Negative.   Cardiovascular: Negative.   Gastrointestinal: Negative.  Negative for blood in stool and melena.  Genitourinary: Positive for frequency.  Musculoskeletal: Negative.   Neurological: Positive for weakness.    As per HPI. Otherwise, a complete review of systems is negatve.  PAST MEDICAL HISTORY: Past Medical History  Diagnosis Date  . COPD (chronic obstructive pulmonary disease) ([place])   . Schizophrenia ([place])   . Arthritis   . Hypertension   . Diabetes mellitus without complication ([place])   . Depression   . GERD (gastroesophageal reflux disease)   . Sleep apnea   . Cancer ([place])     cervical    PAST SURGICAL HISTORY: Past Surgical History  Procedure Laterality Date  . Joint replacement    . Appendectomy    . Colonoscopy with propofol N/A 12/25/2014    Procedure: COLONOSCOPY WITH PROPOFOL;  Surgeon: Josefine Class, MD;  Location: [facility] ENDOSCOPY;  Service: Endoscopy;  Laterality: N/A;  . Esophagogastroduodenoscopy (egd) with propofol N/A 12/25/2014    Procedure: ESOPHAGOGASTRODUODENOSCOPY (EGD) WITH PROPOFOL;  Surgeon: Josefine Class, MD;  Location: [facility] ENDOSCOPY;  Service: Endoscopy;  Laterality: N/A;    FAMILY HISTORY Family History  Problem Relation Age of Onset  . Hypertension Mother   . Diabetes Mellitus II Mother        ADVANCED DIRECTIVES:    HEALTH MAINTENANCE: Social History  Substance Use Topics  . Smoking status: Current Every Day Smoker -- 1.00 packs/day  . Smokeless tobacco: Never Used  . Alcohol Use: No     Colonoscopy:  PAP:  Bone density:  Lipid panel:  Allergies  Allergen Reactions  . Iodine   . Penicillins     Current Outpatient Prescriptions  Medication Sig Dispense Refill  . albuterol (PROVENTIL HFA;VENTOLIN HFA) 108 (90 BASE) MCG/ACT inhaler Inhale 2 puffs into the lungs every 6 (six) hours as needed for wheezing or shortness of breath.    . budesonide-formoterol (SYMBICORT) 160-4.5 MCG/ACT inhaler Inhale 2 puffs into the lungs 2 (two) times daily.    Marland Kitchen diltiazem (CARDIZEM CD) 180 MG 24 hr capsule Take 180 mg by mouth daily.    . enalapril (VASOTEC) 20 MG tablet take 1 tablet by mouth twice a day    . hydrochlorothiazide (HYDRODIURIL) 25 MG tablet Take 25 mg by mouth daily.    . insulin detemir (LEVEMIR) 100 UNIT/ML injection Inject 15 Units into the skin at bedtime.    Marland Kitchen ipratropium-albuterol (DUONEB) 0.5-2.5 (3) MG/3ML SOLN Take 3 mLs by nebulization every 6 (six) hours as needed.    . metFORMIN (GLUCOPHAGE) 1000 MG tablet Take 1,000 mg by mouth 2 (two) times daily with a meal.    . omeprazole (PRILOSEC)  20 MG capsule Take 20 mg by mouth daily.    Marland Kitchen oxyCODONE-acetaminophen (PERCOCET) 7.5-325 MG per tablet Take 1 tablet by mouth every 6 (six) hours as needed for severe pain.    Marland Kitchen risperiDONE (RISPERDAL) 1 MG tablet Take 1 mg by mouth at bedtime.    . triamcinolone (KENALOG) 0.1 % paste Use as directed 1 application in the mouth or throat daily.  1  . triamcinolone cream (KENALOG) 0.1 % Apply 1 application topically 2 (two) times daily.     No current  facility-administered medications for this visit.    OBJECTIVE: Filed Vitals:   08/03/15 0904  BP: 146/82  Pulse: 91  Temp: 96.7 F (35.9 C)  Resp: 20     Body mass index is 35.67 kg/(m^2).    ECOG FS:0 - Asymptomatic  General: Well-developed, well-nourished, no acute distress. Eyes: Pink conjunctiva, anicteric sclera. Lungs: Clear to auscultation bilaterally. Heart: Regular rate and rhythm. No rubs, murmurs, or gallops. Abdomen: Soft, nontender, nondistended. No organomegaly noted, normoactive bowel sounds. Musculoskeletal: No edema, cyanosis, or clubbing. Neuro: Alert, answering all questions appropriately. Cranial nerves grossly intact. Skin: No rashes or petechiae noted. Psych: Normal affect.  LAB RESULTS:  Lab Results  Component Value Date   NA 141 02/28/2015   K 4.1 02/28/2015   CL 104 02/28/2015   CO2 30 02/28/2015   GLUCOSE 147* 02/28/2015   BUN 11 02/28/2015   CREATININE 0.85 02/28/2015   CALCIUM 8.3* 02/28/2015   PROT 6.3* 02/26/2015   ALBUMIN 3.0* 02/26/2015   AST 13* 02/26/2015   ALT 10* 02/26/2015   ALKPHOS 70 02/26/2015   BILITOT 0.3 02/26/2015   GFRNONAA >60 02/28/2015   GFRAA >60 02/28/2015    Lab Results  Component Value Date   WBC 7.9 07/25/2015   NEUTROABS 5.4 07/25/2015   HGB 12.7 07/25/2015   HCT 39.1 07/25/2015   MCV 82.3 07/25/2015   PLT 214 07/25/2015   Lab Results  Component Value Date   FERRITIN 32 07/25/2015   Lab Results  Component Value Date   IRON 50 07/25/2015   TIBC 362 07/25/2015   IRONPCTSAT 14 07/25/2015      STUDIES: No results found.  ASSESSMENT: Iron deficiency anemia.  PLAN:    1. Iron deficiency anemia: Patient's hemoglobin and iron stores continue to be within normal limits. The remainder of her blood work is either negative or within normal limits. She does not require additional IV Feraheme today. Return to clinic in 4 months with repeat laboratory work and further evaluation. Patient has also been  instructed to keep her previously scheduled follow-up appointments with GI. 2. Hyperglycemia: Continue insulin and diabetic medications as prescribed. 3. Hypertension: Patient's blood pressure is mildly elevated today, continue current medications as prescribed. 4. Urinary frequency: Will get UA and culture to further evaluate.  Patient expressed understanding and was in agreement with this plan. She also understands that She can call clinic at any time with any questions, concerns, or complaints.    Lloyd Huger, MD   08/19/2015 7:44 AM

## 2015-08-20 ENCOUNTER — Other Ambulatory Visit: Payer: Self-pay | Admitting: Internal Medicine

## 2015-08-20 DIAGNOSIS — Z1231 Encounter for screening mammogram for malignant neoplasm of breast: Secondary | ICD-10-CM

## 2015-08-21 ENCOUNTER — Other Ambulatory Visit: Payer: Self-pay | Admitting: *Deleted

## 2015-08-21 DIAGNOSIS — N390 Urinary tract infection, site not specified: Secondary | ICD-10-CM

## 2015-08-21 MED ORDER — LEVOFLOXACIN 500 MG PO TABS: 500.0000 mg | ORAL_TABLET | Freq: Every day | ORAL | Status: DC

## 2015-08-22 ENCOUNTER — Telehealth: Payer: Self-pay | Admitting: Oncology

## 2015-08-31 ENCOUNTER — Other Ambulatory Visit: Payer: Medicare Other

## 2015-11-13 ENCOUNTER — Ambulatory Visit: Payer: Medicare Other | Attending: Internal Medicine

## 2015-11-30 ENCOUNTER — Inpatient Hospital Stay: Payer: Medicare Other | Attending: Oncology

## 2015-11-30 DIAGNOSIS — Z7984 Long term (current) use of oral hypoglycemic drugs: Secondary | ICD-10-CM | POA: Insufficient documentation

## 2015-11-30 DIAGNOSIS — F17210 Nicotine dependence, cigarettes, uncomplicated: Secondary | ICD-10-CM | POA: Diagnosis not present

## 2015-11-30 DIAGNOSIS — G4730 Sleep apnea, unspecified: Secondary | ICD-10-CM | POA: Diagnosis not present

## 2015-11-30 DIAGNOSIS — F329 Major depressive disorder, single episode, unspecified: Secondary | ICD-10-CM | POA: Insufficient documentation

## 2015-11-30 DIAGNOSIS — E1165 Type 2 diabetes mellitus with hyperglycemia: Secondary | ICD-10-CM | POA: Diagnosis not present

## 2015-11-30 DIAGNOSIS — Z794 Long term (current) use of insulin: Secondary | ICD-10-CM | POA: Insufficient documentation

## 2015-11-30 DIAGNOSIS — R319 Hematuria, unspecified: Secondary | ICD-10-CM | POA: Diagnosis not present

## 2015-11-30 DIAGNOSIS — I10 Essential (primary) hypertension: Secondary | ICD-10-CM | POA: Insufficient documentation

## 2015-11-30 DIAGNOSIS — Z79899 Other long term (current) drug therapy: Secondary | ICD-10-CM | POA: Diagnosis not present

## 2015-11-30 DIAGNOSIS — M1990 Unspecified osteoarthritis, unspecified site: Secondary | ICD-10-CM | POA: Insufficient documentation

## 2015-11-30 DIAGNOSIS — K219 Gastro-esophageal reflux disease without esophagitis: Secondary | ICD-10-CM | POA: Diagnosis not present

## 2015-11-30 DIAGNOSIS — M25552 Pain in left hip: Secondary | ICD-10-CM | POA: Insufficient documentation

## 2015-11-30 DIAGNOSIS — J449 Chronic obstructive pulmonary disease, unspecified: Secondary | ICD-10-CM | POA: Insufficient documentation

## 2015-11-30 DIAGNOSIS — F209 Schizophrenia, unspecified: Secondary | ICD-10-CM | POA: Diagnosis not present

## 2015-11-30 DIAGNOSIS — D509 Iron deficiency anemia, unspecified: Secondary | ICD-10-CM | POA: Diagnosis present

## 2015-11-30 LAB — CBC WITH DIFFERENTIAL/PLATELET
BASOS ABS: 0.1 10*3/uL (ref 0–0.1)
BASOS PCT: 1 %
EOS PCT: 2 %
Eosinophils Absolute: 0.1 10*3/uL (ref 0–0.7)
HCT: 39.2 % (ref 35.0–47.0)
Hemoglobin: 12.8 g/dL (ref 12.0–16.0)
LYMPHS PCT: 35 %
Lymphs Abs: 2.4 10*3/uL (ref 1.0–3.6)
MCH: 28 pg (ref 26.0–34.0)
MCHC: 32.7 g/dL (ref 32.0–36.0)
MCV: 85.5 fL (ref 80.0–100.0)
MONO ABS: 0.4 10*3/uL (ref 0.2–0.9)
Monocytes Relative: 6 %
Neutro Abs: 3.9 10*3/uL (ref 1.4–6.5)
Neutrophils Relative %: 56 %
PLATELETS: 210 10*3/uL (ref 150–440)
RBC: 4.58 MIL/uL (ref 3.80–5.20)
RDW: 15.8 % — AB (ref 11.5–14.5)
WBC: 6.9 10*3/uL (ref 3.6–11.0)

## 2015-11-30 LAB — FERRITIN: FERRITIN: 17 ng/mL (ref 11–307)

## 2015-11-30 LAB — IRON AND TIBC
IRON: 54 ug/dL (ref 28–170)
SATURATION RATIOS: 13 % (ref 10.4–31.8)
TIBC: 417 ug/dL (ref 250–450)
UIBC: 363 ug/dL

## 2015-12-07 ENCOUNTER — Inpatient Hospital Stay (HOSPITAL_BASED_OUTPATIENT_CLINIC_OR_DEPARTMENT_OTHER): Payer: Medicare Other | Admitting: Oncology

## 2015-12-07 VITALS — BP 158/92 | HR 86 | Temp 97.3°F | Resp 18 | Wt 202.4 lb

## 2015-12-07 DIAGNOSIS — I10 Essential (primary) hypertension: Secondary | ICD-10-CM | POA: Diagnosis not present

## 2015-12-07 DIAGNOSIS — Z79899 Other long term (current) drug therapy: Secondary | ICD-10-CM

## 2015-12-07 DIAGNOSIS — M25552 Pain in left hip: Secondary | ICD-10-CM

## 2015-12-07 DIAGNOSIS — R319 Hematuria, unspecified: Secondary | ICD-10-CM | POA: Diagnosis not present

## 2015-12-07 DIAGNOSIS — D509 Iron deficiency anemia, unspecified: Secondary | ICD-10-CM | POA: Diagnosis not present

## 2015-12-07 DIAGNOSIS — D649 Anemia, unspecified: Secondary | ICD-10-CM

## 2015-12-07 DIAGNOSIS — E1165 Type 2 diabetes mellitus with hyperglycemia: Secondary | ICD-10-CM

## 2015-12-07 NOTE — Progress Notes (Signed)
[place]  [phone]) [phone] Fax:(336) [phone]  ID: Deanna Gay OB: April 07, 1956  MR#: [id number]  BSW#:[id number]  Patient Care Team: Kirk Ruths, MD as PCP - General (Internal Medicine)  CHIEF COMPLAINT:  Chief Complaint  Patient presents with  . Anemia    INTERVAL HISTORY: Patient returns to clinic today for further evaluation and laboratory work. She currently feels well and is asymptomatic. She has chronic fatigue. She has left hip pain. She sees orthopedic and may have hip surgery in the future. She has no neurologic complaints. She denies any recent fevers. She has no chest pain or shortness of breath. She denies any nausea, vomiting, constipation, or diarrhea. She still has blood in her urine but states she always has. She does not have dysuria or frequency.  Patient offers no further specific complaints today.  REVIEW OF SYSTEMS:   Review of Systems  Constitutional: Positive for malaise/fatigue. Negative for weight loss.  Respiratory: Negative.   Cardiovascular: Negative.   Gastrointestinal: Negative.  Negative for blood in stool and melena.  Genitourinary: Positive for hematuria.  Musculoskeletal: Positive for joint pain.  Neurological: Positive for weakness.    As per HPI. Otherwise, a complete review of systems is negatve.  PAST MEDICAL HISTORY: Past Medical History  Diagnosis Date  . COPD (chronic obstructive pulmonary disease) ([place])   . Schizophrenia ([place])   . Arthritis   . Hypertension   . Diabetes mellitus without complication ([place])   . Depression   . GERD (gastroesophageal reflux disease)   . Sleep apnea   . Cancer ([place])     cervical    PAST SURGICAL HISTORY: Past Surgical History  Procedure Laterality Date  . Joint replacement    . Appendectomy    . Colonoscopy with propofol N/A 12/25/2014    Procedure: COLONOSCOPY WITH PROPOFOL;  Surgeon: Josefine Class, MD;  Location: [facility] ENDOSCOPY;  Service:  Endoscopy;  Laterality: N/A;  . Esophagogastroduodenoscopy (egd) with propofol N/A 12/25/2014    Procedure: ESOPHAGOGASTRODUODENOSCOPY (EGD) WITH PROPOFOL;  Surgeon: Josefine Class, MD;  Location: [facility] ENDOSCOPY;  Service: Endoscopy;  Laterality: N/A;    FAMILY HISTORY Family History  Problem Relation Age of Onset  . Hypertension Mother   . Diabetes Mellitus II Mother        ADVANCED DIRECTIVES:    HEALTH MAINTENANCE: Social History  Substance Use Topics  . Smoking status: Current Every Day Smoker -- 1.00 packs/day  . Smokeless tobacco: Never Used  . Alcohol Use: No     Allergies  Allergen Reactions  . Iodine   . Penicillins     Current Outpatient Prescriptions  Medication Sig Dispense Refill  . albuterol (PROVENTIL HFA;VENTOLIN HFA) 108 (90 BASE) MCG/ACT inhaler Inhale 2 puffs into the lungs every 6 (six) hours as needed for wheezing or shortness of breath.    . budesonide-formoterol (SYMBICORT) 160-4.5 MCG/ACT inhaler Inhale 2 puffs into the lungs 2 (two) times daily.    Marland Kitchen diltiazem (CARDIZEM CD) 180 MG 24 hr capsule Take 180 mg by mouth daily.    . enalapril (VASOTEC) 20 MG tablet take 1 tablet by mouth twice a day    . hydrochlorothiazide (HYDRODIURIL) 25 MG tablet Take 25 mg by mouth daily.    Marland Kitchen ipratropium-albuterol (DUONEB) 0.5-2.5 (3) MG/3ML SOLN Take 3 mLs by nebulization every 6 (six) hours as needed.    . meloxicam (MOBIC) 7.5 MG tablet Take 1 tablet by mouth daily.    . metFORMIN (GLUCOPHAGE) 1000 MG tablet  Take 1,000 mg by mouth 2 (two) times daily with a meal.    . oxyCODONE-acetaminophen (PERCOCET) 7.5-325 MG per tablet Take 1 tablet by mouth every 6 (six) hours as needed for severe pain.    Marland Kitchen triamcinolone (KENALOG) 0.1 % paste Use as directed 1 application in the mouth or throat daily.  1  . triamcinolone cream (KENALOG) 0.1 % Apply 1 application topically 2 (two) times daily.     No current facility-administered medications for this visit.     OBJECTIVE: Filed Vitals:   12/07/15 0842  BP: 158/92  Pulse: 86  Temp: 97.3 F (36.3 C)  Resp: 18     Body mass index is 34.72 kg/(m^2).    ECOG FS:0 - Asymptomatic  General: Well-developed, well-nourished, no acute distress. Eyes: Pink conjunctiva, anicteric sclera. Lungs: Clear to auscultation bilaterally. Heart: Regular rate and rhythm. No rubs, murmurs, or gallops. Abdomen: Soft, nontender, nondistended. No organomegaly noted, normoactive bowel sounds. Musculoskeletal: No edema, cyanosis, or clubbing. Neuro: Alert, answering all questions appropriately. Cranial nerves grossly intact. Skin: No rashes or petechiae noted. Psych: Normal affect.  LAB RESULTS:  Lab Results  Component Value Date   NA 141 02/28/2015   K 4.1 02/28/2015   CL 104 02/28/2015   CO2 30 02/28/2015   GLUCOSE 147* 02/28/2015   BUN 11 02/28/2015   CREATININE 0.85 02/28/2015   CALCIUM 8.3* 02/28/2015   PROT 6.3* 02/26/2015   ALBUMIN 3.0* 02/26/2015   AST 13* 02/26/2015   ALT 10* 02/26/2015   ALKPHOS 70 02/26/2015   BILITOT 0.3 02/26/2015   GFRNONAA >60 02/28/2015   GFRAA >60 02/28/2015    Lab Results  Component Value Date   WBC 6.9 11/30/2015   NEUTROABS 3.9 11/30/2015   HGB 12.8 11/30/2015   HCT 39.2 11/30/2015   MCV 85.5 11/30/2015   PLT 210 11/30/2015   Lab Results  Component Value Date   FERRITIN 17 11/30/2015   Lab Results  Component Value Date   IRON 54 11/30/2015   TIBC 417 11/30/2015   IRONPCTSAT 13 11/30/2015      STUDIES: No results found.  ASSESSMENT: Iron deficiency anemia.  PLAN:    1. Iron deficiency anemia: Patient's hemoglobin and iron stores continue to be within normal limits. The remainder of her blood work is either negative or within normal limits. She does not require additional IV Feraheme today. Return to clinic in 6 months with repeat laboratory work and further evaluation. 2. Hyperglycemia: Continue insulin and diabetic medications as  prescribed. 3. Hypertension: Patient's blood pressure is mildly elevated today, continue current medications as prescribed. 4. Hematuria: Patient states she 'has always had' blood in her urine. Treatment per pcp. She sees her pcp next week. 5. Hip pain: Treatment per orthopedic  Patient expressed understanding and was in agreement with this plan. She also understands that She can call clinic at any time with any questions, concerns, or complaints.    Mayra Reel, NP   12/07/2015 8:55 AM

## 2015-12-07 NOTE — Progress Notes (Signed)
Having left hip pain the past few days. Pt states did not have an injury and pain started suddenly during the day. Decreased appetite due to stomach pain after eating. Having small amount of blood in urine per pt. Instructed pt to follow up with PCP next Friday. Per pt Dr. Grayland Ormond, started on antibiotics but blood in urine did not resolve.

## 2015-12-28 ENCOUNTER — Ambulatory Visit: Payer: Medicare Other

## 2016-01-15 ENCOUNTER — Other Ambulatory Visit: Payer: Self-pay | Admitting: Internal Medicine

## 2016-01-15 ENCOUNTER — Ambulatory Visit
Admission: RE | Admit: 2016-01-15 | Discharge: 2016-01-15 | Disposition: A | Discharge status: Home / Self Care | Payer: Medicare Other | Source: Ambulatory Visit | Attending: Internal Medicine | Admitting: Internal Medicine

## 2016-01-15 DIAGNOSIS — Z1231 Encounter for screening mammogram for malignant neoplasm of breast: Secondary | ICD-10-CM

## 2016-04-08 ENCOUNTER — Ambulatory Visit (INDEPENDENT_AMBULATORY_CARE_PROVIDER_SITE_OTHER): Payer: Medicare Other | Admitting: Orthopaedic Surgery

## 2016-04-08 ENCOUNTER — Ambulatory Visit (INDEPENDENT_AMBULATORY_CARE_PROVIDER_SITE_OTHER): Payer: Medicare Other

## 2016-04-08 DIAGNOSIS — M1612 Unilateral primary osteoarthritis, left hip: Secondary | ICD-10-CM | POA: Diagnosis not present

## 2016-04-08 DIAGNOSIS — M25552 Pain in left hip: Secondary | ICD-10-CM

## 2016-04-08 NOTE — Progress Notes (Signed)
Office Visit Note   Patient: Deanna Gay           Date of Birth: 08-20-1955           MRN: [id number] Visit Date: 04/08/2016              Requested by: Kirk Ruths, MD [facility] [place], [place] [address] PCP: Kirk Ruths., MD   Assessment & Plan: Visit Diagnoses:  1. Pain in left hip   2. Unilateral primary osteoarthritis, left hip     Plan: At this point due to the severity of her pain, her severe limp, and the failure conservative treatment I am recommending a direct anterior hip replacement for her left hip. I showed her hip model as well as her x-rays and gave her copies of x-rays and explained in detail what hip replacement surgery involves including a detailed discussion of the risk and benefits of the surgery. I gave her handout about anterior placement surgery as well. We will work on getting the surgery set up for hopefully before the end of the year. She is seeing her primary care physician next week and I gave her a note to give him to send [place] her labs as well.  Follow-Up Instructions: Return if symptoms worsen or fail to improve.   Orders:  Orders Placed This Encounter  Procedures  . XR HIP UNILAT W OR W/O PELVIS 1V LEFT   No orders of the defined types were placed in this encounter.     Procedures: No procedures performed   Clinical Data: No additional findings.   Subjective: Chief Complaint  Patient presents with  . Left Hip - Pain    Left hip pain 7-8 months. States she has really had pain since she had her other hip replaced in 2013. She states she has an awful leg discrepancy.    HPI She is a very pleasant 60 year old female with hypertension, diabetes, and a history of COPD. She says all these are good control and she actually sees her primary care physician on the 28th of this month. She said her labs have been good in the past and she actually had -year-old today. Her last imaging of an A1c  about a year ago was 6.1. Her pain on her left side is severe. It's in her hip and it's in the groin area. She walks with a significant limp. Her pain is 10 out of 10. She is concerned about a leg length discrepancy and the fact that even anti-inflammatories and walking with assistive devices not helping. Review of Systems She denies any current headache, chest pain, shortness of breath, fever, chills, nausea, vomiting.  Objective: Vital Signs: LMP  (LMP Unknown) Comment: postmenopausal  Physical Exam He is alert and oriented 3 in no acute distress Ortho Exam When I had her lay supine I do not feel that there is significant limp is discrepancy at all. However her right hip has fluid range of motion her left hip has essentially almost no motion with severe pain with internal/external rotation when I attempt to try to move the hip. Specialty Comments:  No specialty comments available.  Imaging: Xr Hip Unilat W Or W/o Pelvis 1v Left  Result Date: 04/08/2016 An AP pelvis and lateral of her left hip she is a hip replacement on her right side that shows no acute findings. Her left hip has severe end-stage arthritis. There is almost no joint space remaining at this  point. There severe sclerotic changes and para-articular osteophytes consistent with "bone on bone wear"    PMFS History: Patient Active Problem List   Diagnosis Date Noted  . Symptomatic anemia 02/27/2015   Past Medical History:  Diagnosis Date  . Arthritis   . Cancer ([place])    cervical  . COPD (chronic obstructive pulmonary disease) ([place])   . Depression   . Diabetes mellitus without complication ([place])   . GERD (gastroesophageal reflux disease)   . Hypertension   . Schizophrenia ([place])   . Sleep apnea     Family History  Problem Relation Age of Onset  . Hypertension Mother   . Diabetes Mellitus II Mother   . Breast cancer Neg Hx     Past Surgical History:  Procedure Laterality Date  . APPENDECTOMY    . COLONOSCOPY  WITH PROPOFOL N/A 12/25/2014   Procedure: COLONOSCOPY WITH PROPOFOL;  Surgeon: Josefine Class, MD;  Location: [facility] ENDOSCOPY;  Service: Endoscopy;  Laterality: N/A;  . ESOPHAGOGASTRODUODENOSCOPY (EGD) WITH PROPOFOL N/A 12/25/2014   Procedure: ESOPHAGOGASTRODUODENOSCOPY (EGD) WITH PROPOFOL;  Surgeon: Josefine Class, MD;  [facility] ENDOSCOPY;  Service: Endoscopy;  Laterality: N/A;  . JOINT REPLACEMENT     Social History   Occupational History  . Not on file.   Social History Main Topics  . Smoking status: Current Every Day Smoker    Packs/day: 1.00  . Smokeless tobacco: Never Used  . Alcohol use No  . Drug use: No  . Sexual activity: Not on file

## 2016-05-02 NOTE — Progress Notes (Signed)
Please place orders in EPIC as patient is being scheduled for a Pre-op appointment! Thank you!

## 2016-05-05 ENCOUNTER — Other Ambulatory Visit (INDEPENDENT_AMBULATORY_CARE_PROVIDER_SITE_OTHER): Payer: Self-pay | Admitting: Physician Assistant

## 2016-05-05 NOTE — Progress Notes (Signed)
Pt has preop appt scheduled for 05/07/2016; please place surgical orders in epic. Thanks.

## 2016-05-07 ENCOUNTER — Inpatient Hospital Stay (HOSPITAL_COMMUNITY)
Admission: RE | Admit: 2016-05-07 | Discharge: 2016-05-07 | Disposition: A | Discharge status: Home / Self Care | Payer: Medicare Other | Source: Ambulatory Visit

## 2016-05-08 ENCOUNTER — Encounter: Payer: Self-pay | Admitting: Emergency Medicine

## 2016-05-08 ENCOUNTER — Encounter (HOSPITAL_COMMUNITY): Admission: RE | Admit: 2016-05-08 | Payer: Medicare Other | Source: Ambulatory Visit

## 2016-05-08 ENCOUNTER — Emergency Department
Admission: EM | Admit: 2016-05-08 | Discharge: 2016-05-08 | Disposition: A | Discharge status: Home / Self Care | Payer: Medicare Other | Attending: Emergency Medicine | Admitting: Emergency Medicine

## 2016-05-08 DIAGNOSIS — Z79899 Other long term (current) drug therapy: Secondary | ICD-10-CM | POA: Insufficient documentation

## 2016-05-08 DIAGNOSIS — E119 Type 2 diabetes mellitus without complications: Secondary | ICD-10-CM | POA: Diagnosis not present

## 2016-05-08 DIAGNOSIS — R531 Weakness: Secondary | ICD-10-CM | POA: Insufficient documentation

## 2016-05-08 DIAGNOSIS — Z791 Long term (current) use of non-steroidal anti-inflammatories (NSAID): Secondary | ICD-10-CM | POA: Insufficient documentation

## 2016-05-08 DIAGNOSIS — I10 Essential (primary) hypertension: Secondary | ICD-10-CM | POA: Diagnosis not present

## 2016-05-08 DIAGNOSIS — J449 Chronic obstructive pulmonary disease, unspecified: Secondary | ICD-10-CM | POA: Diagnosis not present

## 2016-05-08 DIAGNOSIS — Z794 Long term (current) use of insulin: Secondary | ICD-10-CM | POA: Diagnosis not present

## 2016-05-08 DIAGNOSIS — F17210 Nicotine dependence, cigarettes, uncomplicated: Secondary | ICD-10-CM | POA: Insufficient documentation

## 2016-05-08 DIAGNOSIS — Z5321 Procedure and treatment not carried out due to patient leaving prior to being seen by health care provider: Secondary | ICD-10-CM | POA: Insufficient documentation

## 2016-05-08 LAB — CBC
HEMATOCRIT: 42.8 % (ref 35.0–47.0)
Hemoglobin: 14.2 g/dL (ref 12.0–16.0)
MCH: 27.3 pg (ref 26.0–34.0)
MCHC: 33.1 g/dL (ref 32.0–36.0)
MCV: 82.5 fL (ref 80.0–100.0)
PLATELETS: 286 10*3/uL (ref 150–440)
RBC: 5.19 MIL/uL (ref 3.80–5.20)
RDW: 15.1 % — ABNORMAL HIGH (ref 11.5–14.5)
WBC: 6.4 10*3/uL (ref 3.6–11.0)

## 2016-05-08 LAB — TROPONIN I

## 2016-05-08 LAB — BASIC METABOLIC PANEL
Anion gap: 10 (ref 5–15)
BUN: 42 mg/dL — AB (ref 6–20)
CHLORIDE: 92 mmol/L — AB (ref 101–111)
CO2: 28 mmol/L (ref 22–32)
CREATININE: 1.45 mg/dL — AB (ref 0.44–1.00)
Calcium: 8.2 mg/dL — ABNORMAL LOW (ref 8.9–10.3)
GFR calc non Af Amer: 38 mL/min — ABNORMAL LOW (ref >60)
GFR, EST AFRICAN AMERICAN: 44 mL/min — AB (ref >60)
Glucose, Bld: 135 mg/dL — ABNORMAL HIGH (ref 65–99)
POTASSIUM: 4.2 mmol/L (ref 3.5–5.1)
Sodium: 130 mmol/L — ABNORMAL LOW (ref 135–145)

## 2016-05-08 LAB — URINALYSIS, COMPLETE (UACMP) WITH MICROSCOPIC
BILIRUBIN URINE: NEGATIVE
Glucose, UA: NEGATIVE mg/dL
KETONES UR: NEGATIVE mg/dL
Nitrite: NEGATIVE
PROTEIN: 30 mg/dL — AB
SPECIFIC GRAVITY, URINE: 1.015 (ref 1.005–1.030)
pH: 5 (ref 5.0–8.0)

## 2016-05-08 MED ORDER — ONDANSETRON 4 MG PO TBDP
ORAL_TABLET | ORAL | Status: AC
  Administered 2016-05-08: 4 mg via ORAL
  Filled 2016-05-08: qty 1

## 2016-05-08 MED ORDER — ONDANSETRON 4 MG PO TBDP
4.0000 mg | ORAL_TABLET | Freq: Once | ORAL | Status: AC
  Administered 2016-05-08: 4 mg via ORAL

## 2016-05-08 NOTE — ED Triage Notes (Signed)
Patient to ER for c/o generalized weakness for 4 days. States she was told for a while that she had UTI, unsure if it ever cleared up. Patient states she went to [facility] and laid in floor because she felt so bad.

## 2016-05-08 NOTE — ED Notes (Signed)
Pt not in room when this RN went to round.  First RN stated that pt walked out.  Pt had expressed unhappiness at long wait time during assessment.  Pt ambulatory.

## 2016-05-08 NOTE — Patient Instructions (Addendum)
KAREL MOWERS  05/08/2016   Your procedure is scheduled on: Friday 05/16/2016  Report to [facility]  Entrance take [facility]  elevators to 3rd floor to  [place] at   0900 AM.  Call this number if you have problems the morning of surgery [phone]   Remember: ONLY 1 PERSON MAY GO WITH YOU TO SHORT STAY TO GET  READY MORNING OF [place].   Do not eat food or drink liquids :After Midnight.               Bring CPAP mask and tubing with you to hospital morning of surgery!    Take these medicines the morning of surgery with A SIP OF WATER:Diltiazem, Dicycolomine, Bupropion (Wellbutrin), use inhalers and nebulizers if needed  How to Manage Your Diabetes Before and After Surgery  Why is it important to control my blood sugar before and after surgery? . Improving blood sugar levels before and after surgery helps healing and can limit problems. . A way of improving blood sugar control is eating a healthy diet by: o  Eating less sugar and carbohydrates o  Increasing activity/exercise o  Talking with your doctor about reaching your blood sugar goals . High blood sugars (greater than 180 mg/dL) can raise your risk of infections and slow your recovery, so you will need to focus on controlling your diabetes during the weeks before surgery. . Make sure that the doctor who takes care of your diabetes knows about your planned surgery including the date and location.  How do I manage my blood sugar before surgery? . Check your blood sugar at least 4 times a day, starting 2 days before surgery, to make sure that the level is not too high or low. o Check your blood sugar the morning of your surgery when you wake up and every 2 hours until you get to the Short Stay unit. . If your blood sugar is less than 70 mg/dL, you will need to treat for low blood sugar: o Do not take insulin. o Treat a low blood sugar (less than 70 mg/dL) with  cup of clear juice  (cranberry or apple), 4 glucose tablets, OR glucose gel. o Recheck blood sugar in 15 minutes after treatment (to make sure it is greater than 70 mg/dL). If your blood sugar is not greater than 70 mg/dL on recheck, call [phone] for further instructions. . Report your blood sugar to the short stay nurse when you get to Short Stay.  . If you are admitted to the hospital after surgery: o Your blood sugar will be checked by the staff and you will probably be given insulin after surgery (instead of oral diabetes medicines) to make sure you have good blood sugar levels. o The goal for blood sugar control after surgery is 80-180 mg/dL.   WHAT DO I DO ABOUT MY DIABETES MEDICATION?  Marland Kitchen Do not take oral diabetes medicines (pills) the morning of surgery.  . THE NIGHT BEFORE SURGERY, take ___7________ units of ___Lantus________insulin.       . THE MORNING OF SURGERY, take _________0____ units of ______Lantus____insulin.      DO NOT TAKE ANY DIABETIC MEDICATIONS DAY OF YOUR SURGERY                               You may not have  any metal on your body including hair pins and              piercings  Do not wear jewelry, make-up, lotions, powders or perfumes, deodorant             Do not wear nail polish.  Do not shave  48 hours prior to surgery.              Men may shave face and neck.   Do not bring valuables to the hospital. [place].  Contacts, dentures or bridgework may not be worn into surgery.  Leave suitcase in the car. After surgery it may be brought to your room.                  Please read over the following fact sheets you were given: _____________________________________________________________________             [facility] - Preparing for Surgery Before surgery, you can play an important role.  Because skin is not sterile, your skin needs to be as free of germs as possible.  You can reduce the number of germs on your  skin by washing with CHG (chlorahexidine gluconate) soap before surgery.  CHG is an antiseptic cleaner which kills germs and bonds with the skin to continue killing germs even after washing. Please DO NOT use if you have an allergy to CHG or antibacterial soaps.  If your skin becomes reddened/irritated stop using the CHG and inform your nurse when you arrive at Short Stay. Do not shave (including legs and underarms) for at least 48 hours prior to the first CHG shower.  You may shave your face/neck. Please follow these instructions carefully:  1.  Shower with CHG Soap the night before surgery and the  morning of Surgery.  2.  If you choose to wash your hair, wash your hair first as usual with your  normal  shampoo.  3.  After you shampoo, rinse your hair and body thoroughly to remove the  shampoo.                           4.  Use CHG as you would any other liquid soap.  You can apply chg directly  to the skin and wash                       Gently with a scrungie or clean washcloth.  5.  Apply the CHG Soap to your body ONLY FROM THE NECK DOWN.   Do not use on face/ open                           Wound or open sores. Avoid contact with eyes, ears mouth and genitals (private parts).                       Wash face,  Genitals (private parts) with your normal soap.             6.  Wash thoroughly, paying special attention to the area where your surgery  will be performed.  7.  Thoroughly rinse your body with warm water from the neck down.  8.  DO NOT shower/wash with your normal soap after using and rinsing off  the CHG Soap.                9.  Pat yourself dry with a clean towel.            10.  Wear clean pajamas.            11.  Place clean sheets on your bed the night of your first shower and do not  sleep with pets. Day of Surgery : Do not apply any lotions/deodorants the morning of surgery.  Please wear clean clothes to the hospital/surgery center.  FAILURE TO FOLLOW THESE INSTRUCTIONS MAY  RESULT IN THE CANCELLATION OF YOUR SURGERY PATIENT SIGNATURE_________________________________  NURSE SIGNATURE__________________________________  ________________________________________________________________________   Adam Phenix  An incentive spirometer is a tool that can help keep your lungs clear and active. This tool measures how well you are filling your lungs with each breath. Taking long deep breaths may help reverse or decrease the chance of developing breathing (pulmonary) problems (especially infection) following:  A long period of time when you are unable to move or be active. BEFORE THE PROCEDURE   If the spirometer includes an indicator to show your best effort, your nurse or respiratory therapist will set it to a desired goal.  If possible, sit up straight or lean slightly forward. Try not to slouch.  Hold the incentive spirometer in an upright position. INSTRUCTIONS FOR USE  1. Sit on the edge of your bed if possible, or sit up as far as you can in bed or on a chair. 2. Hold the incentive spirometer in an upright position. 3. Breathe out normally. 4. Place the mouthpiece in your mouth and seal your lips tightly around it. 5. Breathe in slowly and as deeply as possible, raising the piston or the ball toward the top of the column. 6. Hold your breath for 3-5 seconds or for as long as possible. Allow the piston or ball to fall to the bottom of the column. 7. Remove the mouthpiece from your mouth and breathe out normally. 8. Rest for a few seconds and repeat Steps 1 through 7 at least 10 times every 1-2 hours when you are awake. Take your time and take a few normal breaths between deep breaths. 9. The spirometer may include an indicator to show your best effort. Use the indicator as a goal to work toward during each repetition. 10. After each set of 10 deep breaths, practice coughing to be sure your lungs are clear. If you have an incision (the cut made at the  time of surgery), support your incision when coughing by placing a pillow or rolled up towels firmly against it. Once you are able to get out of bed, walk around indoors and cough well. You may stop using the incentive spirometer when instructed by your caregiver.  RISKS AND COMPLICATIONS  Take your time so you do not get dizzy or light-headed.  If you are in pain, you may need to take or ask for pain medication before doing incentive spirometry. It is harder to take a deep breath if you are having pain. AFTER USE  Rest and breathe slowly and easily.  It can be helpful to keep track of a log of your progress. Your caregiver can provide you with a simple table to help with this. If you are using the spirometer at home, follow these instructions: [place] IF:   You are having difficultly using the spirometer.  You have trouble using the spirometer as  often as instructed.  Your pain medication is not giving enough relief while using the spirometer.  You develop fever of 100.5 F (38.1 C) or higher. SEEK IMMEDIATE MEDICAL CARE IF:   You cough up bloody sputum that had not been present before.  You develop fever of 102 F (38.9 C) or greater.  You develop worsening pain at or near the incision site. MAKE SURE YOU:   Understand these instructions.  Will watch your condition.  Will get help right away if you are not doing well or get worse. Document Released: 09/15/2006 Document Revised: 07/28/2011 Document Reviewed: 11/16/2006 Exit[employer] Patient Information 2014 [employer], [place].   ________________________________________________________________________  WHAT IS A BLOOD TRANSFUSION? Blood Transfusion Information  A transfusion is the replacement of blood or some of its parts. Blood is made up of multiple cells which provide different functions.  Red blood cells carry oxygen and are used for blood loss replacement.  White blood cells fight against  infection.  Platelets control bleeding.  Plasma helps clot blood.  Other blood products are available for specialized needs, such as hemophilia or other clotting disorders. BEFORE THE TRANSFUSION  Who gives blood for transfusions?   Healthy volunteers who are fully evaluated to make sure their blood is safe. This is blood bank blood. Transfusion therapy is the safest it has ever been in the practice of medicine. Before blood is taken from a donor, a complete history is taken to make sure that person has no history of diseases nor engages in risky social behavior (examples are intravenous drug use or sexual activity with multiple partners). The donor's travel history is screened to minimize risk of transmitting infections, such as malaria. The donated blood is tested for signs of infectious diseases, such as HIV and hepatitis. The blood is then tested to be sure it is compatible with you in order to minimize the chance of a transfusion reaction. If you or a relative donates blood, this is often done in anticipation of surgery and is not appropriate for emergency situations. It takes many days to process the donated blood. RISKS AND COMPLICATIONS Although transfusion therapy is very safe and saves many lives, the main dangers of transfusion include:   Getting an infectious disease.  Developing a transfusion reaction. This is an allergic reaction to something in the blood you were given. Every precaution is taken to prevent this. The decision to have a blood transfusion has been considered carefully by your caregiver before blood is given. Blood is not given unless the benefits outweigh the risks. AFTER THE TRANSFUSION  Right after receiving a blood transfusion, you will usually feel much better and more energetic. This is especially true if your red blood cells have gotten low (anemic). The transfusion raises the level of the red blood cells which carry oxygen, and this usually causes an energy  increase.  The nurse administering the transfusion will monitor you carefully for complications. HOME CARE INSTRUCTIONS  No special instructions are needed after a transfusion. You may find your energy is better. Speak with your caregiver about any limitations on activity for underlying diseases you may have. SEEK MEDICAL CARE IF:   Your condition is not improving after your transfusion.  You develop redness or irritation at the intravenous (IV) site. SEEK IMMEDIATE MEDICAL CARE IF:  Any of the following symptoms occur over the next 12 hours:  Shaking chills.  You have a temperature by mouth above 102 F (38.9 C), not controlled by medicine.  Chest, back, or  muscle pain.  People around you feel you are not acting correctly or are confused.  Shortness of breath or difficulty breathing.  Dizziness and fainting.  You get a rash or develop hives.  You have a decrease in urine output.  Your urine turns a dark color or changes to pink, red, or brown. Any of the following symptoms occur over the next 10 days:  You have a temperature by mouth above 102 F (38.9 C), not controlled by medicine.  Shortness of breath.  Weakness after normal activity.  The white part of the eye turns yellow (jaundice).  You have a decrease in the amount of urine or are urinating less often.  Your urine turns a dark color or changes to pink, red, or brown. Document Released: 05/02/2000 Document Revised: 07/28/2011 Document Reviewed: 12/20/2007 [facility] Patient Information 2014 [place], [place].  _______________________________________________________________________

## 2016-05-08 NOTE — ED Provider Notes (Signed)
Patient left prior to me being able to evaluate her. Told the nurse that she felt better and did not want to wait any longer in the emergency department.   Orbie Pyo, MD 05/08/16 [phone]

## 2016-05-08 NOTE — ED Notes (Signed)
Pt sts that she has been feeling weak, has nausea since Saturday.  Pt sts that she normally has cough, that it is no worse.  Pt sts that she +PO fluids, but unable to keep down food.  Pt denies fever.  Pt alert and oriented, resp even and unlabored.  NAD.

## 2016-05-09 ENCOUNTER — Telehealth: Payer: Self-pay | Admitting: Emergency Medicine

## 2016-05-09 NOTE — Telephone Encounter (Signed)
Called patient due to lwot to inquire about condition and follow up plans. She says she is in [place] at pain clinic now.  I explained that we had done labs here and that I recommend she see her pcp or walk in or return to ed for provider exam.  She says she will come back to [place] and decide if she will go to walkin or here.

## 2016-05-13 ENCOUNTER — Encounter (HOSPITAL_COMMUNITY): Payer: Self-pay

## 2016-05-13 ENCOUNTER — Encounter (HOSPITAL_COMMUNITY)
Admission: RE | Admit: 2016-05-13 | Discharge: 2016-05-13 | Disposition: A | Discharge status: Home / Self Care | Payer: Medicare Other | Source: Ambulatory Visit | Attending: Orthopaedic Surgery | Admitting: Orthopaedic Surgery

## 2016-05-13 DIAGNOSIS — M1990 Unspecified osteoarthritis, unspecified site: Secondary | ICD-10-CM

## 2016-05-13 DIAGNOSIS — D649 Anemia, unspecified: Secondary | ICD-10-CM | POA: Insufficient documentation

## 2016-05-13 DIAGNOSIS — Z01812 Encounter for preprocedural laboratory examination: Secondary | ICD-10-CM | POA: Insufficient documentation

## 2016-05-13 DIAGNOSIS — E119 Type 2 diabetes mellitus without complications: Secondary | ICD-10-CM

## 2016-05-13 DIAGNOSIS — Z8541 Personal history of malignant neoplasm of cervix uteri: Secondary | ICD-10-CM | POA: Insufficient documentation

## 2016-05-13 DIAGNOSIS — J449 Chronic obstructive pulmonary disease, unspecified: Secondary | ICD-10-CM

## 2016-05-13 DIAGNOSIS — K219 Gastro-esophageal reflux disease without esophagitis: Secondary | ICD-10-CM | POA: Insufficient documentation

## 2016-05-13 DIAGNOSIS — I10 Essential (primary) hypertension: Secondary | ICD-10-CM

## 2016-05-13 DIAGNOSIS — F209 Schizophrenia, unspecified: Secondary | ICD-10-CM | POA: Insufficient documentation

## 2016-05-13 LAB — BASIC METABOLIC PANEL
Anion gap: 11 (ref 5–15)
BUN: 12 mg/dL (ref 6–20)
CHLORIDE: 97 mmol/L — AB (ref 101–111)
CO2: 28 mmol/L (ref 22–32)
CREATININE: 0.73 mg/dL (ref 0.44–1.00)
Calcium: 8.9 mg/dL (ref 8.9–10.3)
GFR calc Af Amer: >60 mL/min (ref >60)
GFR calc non Af Amer: >60 mL/min (ref >60)
GLUCOSE: 128 mg/dL — AB (ref 65–99)
Potassium: 4.5 mmol/L (ref 3.5–5.1)
SODIUM: 136 mmol/L (ref 135–145)

## 2016-05-13 LAB — GLUCOSE, CAPILLARY: GLUCOSE-CAPILLARY: 179 mg/dL — AB (ref 65–99)

## 2016-05-13 LAB — SURGICAL PCR SCREEN
MRSA, PCR: NEGATIVE
Staphylococcus aureus: NEGATIVE

## 2016-05-13 LAB — ABO/RH: ABO/RH(D): O POS

## 2016-05-14 LAB — HEMOGLOBIN A1C
HEMOGLOBIN A1C: 6.9 % — AB (ref 4.8–5.6)
MEAN PLASMA GLUCOSE: 151 mg/dL

## 2016-05-16 ENCOUNTER — Inpatient Hospital Stay (HOSPITAL_COMMUNITY): Payer: Medicare Other | Admitting: Anesthesiology

## 2016-05-16 ENCOUNTER — Encounter (HOSPITAL_COMMUNITY)
Admission: RE | Disposition: A | Discharge status: Skilled Nursing Facility | Payer: Self-pay | Source: Ambulatory Visit | Attending: Orthopaedic Surgery

## 2016-05-16 ENCOUNTER — Inpatient Hospital Stay (HOSPITAL_COMMUNITY): Payer: Medicare Other

## 2016-05-16 ENCOUNTER — Encounter (HOSPITAL_COMMUNITY): Payer: Self-pay | Admitting: *Deleted

## 2016-05-16 ENCOUNTER — Inpatient Hospital Stay (HOSPITAL_COMMUNITY)
Admission: RE | Admit: 2016-05-16 | Discharge: 2016-05-20 | DRG: 470 | Disposition: A | Discharge status: Skilled Nursing Facility | Payer: Medicare Other | Source: Ambulatory Visit | Attending: Orthopaedic Surgery | Admitting: Orthopaedic Surgery

## 2016-05-16 DIAGNOSIS — M1612 Unilateral primary osteoarthritis, left hip: Principal | ICD-10-CM

## 2016-05-16 DIAGNOSIS — D62 Acute posthemorrhagic anemia: Secondary | ICD-10-CM | POA: Diagnosis not present

## 2016-05-16 DIAGNOSIS — Z880 Allergy status to penicillin: Secondary | ICD-10-CM | POA: Diagnosis not present

## 2016-05-16 DIAGNOSIS — K219 Gastro-esophageal reflux disease without esophagitis: Secondary | ICD-10-CM | POA: Diagnosis present

## 2016-05-16 DIAGNOSIS — M25752 Osteophyte, left hip: Secondary | ICD-10-CM | POA: Diagnosis present

## 2016-05-16 DIAGNOSIS — G4730 Sleep apnea, unspecified: Secondary | ICD-10-CM | POA: Diagnosis present

## 2016-05-16 DIAGNOSIS — Z79899 Other long term (current) drug therapy: Secondary | ICD-10-CM | POA: Diagnosis not present

## 2016-05-16 DIAGNOSIS — Z8249 Family history of ischemic heart disease and other diseases of the circulatory system: Secondary | ICD-10-CM | POA: Diagnosis not present

## 2016-05-16 DIAGNOSIS — Z833 Family history of diabetes mellitus: Secondary | ICD-10-CM

## 2016-05-16 DIAGNOSIS — Z7951 Long term (current) use of inhaled steroids: Secondary | ICD-10-CM

## 2016-05-16 DIAGNOSIS — E119 Type 2 diabetes mellitus without complications: Secondary | ICD-10-CM | POA: Diagnosis present

## 2016-05-16 DIAGNOSIS — F17210 Nicotine dependence, cigarettes, uncomplicated: Secondary | ICD-10-CM | POA: Diagnosis present

## 2016-05-16 DIAGNOSIS — Z888 Allergy status to other drugs, medicaments and biological substances status: Secondary | ICD-10-CM

## 2016-05-16 DIAGNOSIS — Z794 Long term (current) use of insulin: Secondary | ICD-10-CM

## 2016-05-16 DIAGNOSIS — Z96641 Presence of right artificial hip joint: Secondary | ICD-10-CM | POA: Diagnosis present

## 2016-05-16 DIAGNOSIS — J449 Chronic obstructive pulmonary disease, unspecified: Secondary | ICD-10-CM | POA: Diagnosis present

## 2016-05-16 DIAGNOSIS — I10 Essential (primary) hypertension: Secondary | ICD-10-CM | POA: Diagnosis present

## 2016-05-16 DIAGNOSIS — Z96642 Presence of left artificial hip joint: Secondary | ICD-10-CM

## 2016-05-16 DIAGNOSIS — Z419 Encounter for procedure for purposes other than remedying health state, unspecified: Secondary | ICD-10-CM

## 2016-05-16 HISTORY — PX: TOTAL HIP ARTHROPLASTY: SHX124

## 2016-05-16 LAB — TYPE AND SCREEN
ABO/RH(D): O POS
Antibody Screen: NEGATIVE

## 2016-05-16 LAB — GLUCOSE, CAPILLARY
GLUCOSE-CAPILLARY: 155 mg/dL — AB (ref 65–99)
GLUCOSE-CAPILLARY: 246 mg/dL — AB (ref 65–99)
Glucose-Capillary: 153 mg/dL — ABNORMAL HIGH (ref 65–99)
Glucose-Capillary: 77 mg/dL (ref 65–99)
Glucose-Capillary: 135 mg/dL — ABNORMAL HIGH (ref 65–99)

## 2016-05-16 SURGERY — ARTHROPLASTY, HIP, TOTAL, ANTERIOR APPROACH
Anesthesia: Spinal | Site: Hip | Laterality: Left

## 2016-05-16 MED ORDER — ONDANSETRON HCL 4 MG/2ML IJ SOLN
INTRAMUSCULAR | Status: AC
  Filled 2016-05-16: qty 2

## 2016-05-16 MED ORDER — INSULIN ASPART 100 UNIT/ML ~~LOC~~ SOLN
0.0000 [IU] | Freq: Three times a day (TID) | SUBCUTANEOUS | Status: DC
Start: 2016-05-16 — End: 2016-05-20
  Administered 2016-05-19: 2 [IU] via SUBCUTANEOUS

## 2016-05-16 MED ORDER — DILTIAZEM HCL ER COATED BEADS 180 MG PO CP24
180.0000 mg | ORAL_CAPSULE | Freq: Every day | ORAL | Status: DC
  Administered 2016-05-17 – 2016-05-20 (×4): 180 mg via ORAL
  Filled 2016-05-16 (×4): qty 1

## 2016-05-16 MED ORDER — IPRATROPIUM-ALBUTEROL 0.5-2.5 (3) MG/3ML IN SOLN: 3.0000 mL | Freq: Four times a day (QID) | RESPIRATORY_TRACT | Status: DC | PRN

## 2016-05-16 MED ORDER — ALUM & MAG HYDROXIDE-SIMETH 200-200-20 MG/5ML PO SUSP: 30.0000 mL | ORAL | Status: DC | PRN

## 2016-05-16 MED ORDER — POLYETHYLENE GLYCOL 3350 17 G PO PACK
17.0000 g | PACK | Freq: Every day | ORAL | Status: DC | PRN
  Administered 2016-05-20: 17 g via ORAL
  Filled 2016-05-16: qty 1

## 2016-05-16 MED ORDER — SODIUM CHLORIDE 0.9 % IR SOLN
Status: DC | PRN
  Administered 2016-05-16: 1000 mL

## 2016-05-16 MED ORDER — HYDROCHLOROTHIAZIDE 25 MG PO TABS
25.0000 mg | ORAL_TABLET | Freq: Every day | ORAL | Status: DC
  Administered 2016-05-17 – 2016-05-20 (×4): 25 mg via ORAL
  Filled 2016-05-16 (×4): qty 1

## 2016-05-16 MED ORDER — ZOLPIDEM TARTRATE 5 MG PO TABS: 5.0000 mg | ORAL_TABLET | Freq: Every evening | ORAL | Status: DC | PRN

## 2016-05-16 MED ORDER — TRANEXAMIC ACID 1000 MG/10ML IV SOLN
1000.0000 mg | INTRAVENOUS | Status: DC
  Filled 2016-05-16: qty 10

## 2016-05-16 MED ORDER — ACETAMINOPHEN 325 MG PO TABS: 650.0000 mg | ORAL_TABLET | Freq: Four times a day (QID) | ORAL | Status: DC | PRN

## 2016-05-16 MED ORDER — DOCUSATE SODIUM 100 MG PO CAPS
100.0000 mg | ORAL_CAPSULE | Freq: Two times a day (BID) | ORAL | Status: DC
  Administered 2016-05-16 – 2016-05-20 (×8): 100 mg via ORAL
  Filled 2016-05-16 (×8): qty 1

## 2016-05-16 MED ORDER — POLYSACCHARIDE IRON COMPLEX 150 MG PO CAPS
150.0000 mg | ORAL_CAPSULE | Freq: Two times a day (BID) | ORAL | Status: DC
  Administered 2016-05-16 – 2016-05-20 (×8): 150 mg via ORAL
  Filled 2016-05-16 (×8): qty 1

## 2016-05-16 MED ORDER — CHLORHEXIDINE GLUCONATE 4 % EX LIQD: 60.0000 mL | Freq: Once | CUTANEOUS | Status: DC

## 2016-05-16 MED ORDER — ALBUTEROL SULFATE (2.5 MG/3ML) 0.083% IN NEBU: 2.5000 mg | INHALATION_SOLUTION | RESPIRATORY_TRACT | Status: DC | PRN

## 2016-05-16 MED ORDER — MEPERIDINE HCL 50 MG/ML IJ SOLN: 6.2500 mg | INTRAMUSCULAR | Status: DC | PRN

## 2016-05-16 MED ORDER — SODIUM CHLORIDE 0.9 % IV SOLN
INTRAVENOUS | Status: DC
Start: 2016-05-16 — End: 2016-05-20
  Administered 2016-05-16 – 2016-05-17 (×2): via INTRAVENOUS

## 2016-05-16 MED ORDER — PROPOFOL 10 MG/ML IV BOLUS
INTRAVENOUS | Status: AC
  Filled 2016-05-16: qty 60

## 2016-05-16 MED ORDER — METOCLOPRAMIDE HCL 5 MG PO TABS: 5.0000 mg | ORAL_TABLET | Freq: Three times a day (TID) | ORAL | Status: DC | PRN

## 2016-05-16 MED ORDER — MENTHOL 3 MG MT LOZG: 1.0000 | LOZENGE | OROMUCOSAL | Status: DC | PRN

## 2016-05-16 MED ORDER — BUPIVACAINE IN DEXTROSE 0.75-8.25 % IT SOLN
INTRATHECAL | Status: DC | PRN
  Administered 2016-05-16: 1.8 mL via INTRATHECAL

## 2016-05-16 MED ORDER — MIDAZOLAM HCL 5 MG/5ML IJ SOLN
INTRAMUSCULAR | Status: DC | PRN
  Administered 2016-05-16: 2 mg via INTRAVENOUS

## 2016-05-16 MED ORDER — METOCLOPRAMIDE HCL 5 MG/ML IJ SOLN: 5.0000 mg | Freq: Three times a day (TID) | INTRAMUSCULAR | Status: DC | PRN

## 2016-05-16 MED ORDER — FENTANYL CITRATE (PF) 100 MCG/2ML IJ SOLN
INTRAMUSCULAR | Status: DC | PRN
  Administered 2016-05-16: 50 ug via INTRAVENOUS

## 2016-05-16 MED ORDER — DIPHENHYDRAMINE HCL 12.5 MG/5ML PO ELIX: 12.5000 mg | ORAL_SOLUTION | ORAL | Status: DC | PRN

## 2016-05-16 MED ORDER — STERILE WATER FOR IRRIGATION IR SOLN
Status: DC | PRN
  Administered 2016-05-16: 2000 mL

## 2016-05-16 MED ORDER — METHOCARBAMOL 1000 MG/10ML IJ SOLN
500.0000 mg | Freq: Four times a day (QID) | INTRAVENOUS | Status: DC | PRN
  Administered 2016-05-16: 500 mg via INTRAVENOUS
  Filled 2016-05-16: qty 550
  Filled 2016-05-16: qty 5

## 2016-05-16 MED ORDER — MIDAZOLAM HCL 2 MG/2ML IJ SOLN
INTRAMUSCULAR | Status: AC
  Filled 2016-05-16: qty 2

## 2016-05-16 MED ORDER — LACTATED RINGERS IV SOLN
INTRAVENOUS | Status: DC | PRN
  Administered 2016-05-16 (×2): via INTRAVENOUS

## 2016-05-16 MED ORDER — CLINDAMYCIN PHOSPHATE 600 MG/50ML IV SOLN
600.0000 mg | Freq: Four times a day (QID) | INTRAVENOUS | Status: AC
  Administered 2016-05-16 (×2): 600 mg via INTRAVENOUS
  Filled 2016-05-16 (×2): qty 50

## 2016-05-16 MED ORDER — PHENOL 1.4 % MT LIQD
1.0000 | OROMUCOSAL | Status: DC | PRN
Start: 2016-05-16 — End: 2016-05-20

## 2016-05-16 MED ORDER — HYDROMORPHONE HCL 1 MG/ML IJ SOLN: 0.2500 mg | INTRAMUSCULAR | Status: DC | PRN

## 2016-05-16 MED ORDER — ASPIRIN 81 MG PO CHEW
81.0000 mg | CHEWABLE_TABLET | Freq: Two times a day (BID) | ORAL | Status: DC
  Administered 2016-05-16 – 2016-05-20 (×8): 81 mg via ORAL
  Filled 2016-05-16 (×8): qty 1

## 2016-05-16 MED ORDER — HYDROMORPHONE HCL 1 MG/ML IJ SOLN
1.0000 mg | INTRAMUSCULAR | Status: DC | PRN
  Administered 2016-05-17 (×2): 1 mg via INTRAVENOUS
  Filled 2016-05-16 (×2): qty 1

## 2016-05-16 MED ORDER — ONDANSETRON HCL 4 MG PO TABS: 4.0000 mg | ORAL_TABLET | Freq: Four times a day (QID) | ORAL | Status: DC | PRN

## 2016-05-16 MED ORDER — PROMETHAZINE HCL 25 MG/ML IJ SOLN: 6.2500 mg | INTRAMUSCULAR | Status: DC | PRN

## 2016-05-16 MED ORDER — KETOROLAC TROMETHAMINE 15 MG/ML IJ SOLN
7.5000 mg | Freq: Four times a day (QID) | INTRAMUSCULAR | Status: AC
  Administered 2016-05-16 – 2016-05-17 (×4): 7.5 mg via INTRAVENOUS
  Filled 2016-05-16 (×4): qty 1

## 2016-05-16 MED ORDER — LACTATED RINGERS IV SOLN: INTRAVENOUS | Status: DC

## 2016-05-16 MED ORDER — BUPROPION HCL 75 MG PO TABS: 75.0000 mg | ORAL_TABLET | Freq: Two times a day (BID) | ORAL | Status: DC

## 2016-05-16 MED ORDER — PROPOFOL 500 MG/50ML IV EMUL
INTRAVENOUS | Status: DC | PRN
Start: 2016-05-16 — End: 2016-05-16
  Administered 2016-05-16: 50 ug/kg/min via INTRAVENOUS

## 2016-05-16 MED ORDER — METFORMIN HCL 500 MG PO TABS
1000.0000 mg | ORAL_TABLET | Freq: Two times a day (BID) | ORAL | Status: DC
  Administered 2016-05-17 – 2016-05-20 (×7): 1000 mg via ORAL
  Filled 2016-05-16 (×6): qty 2

## 2016-05-16 MED ORDER — MOMETASONE FURO-FORMOTEROL FUM 200-5 MCG/ACT IN AERO
2.0000 | INHALATION_SPRAY | Freq: Two times a day (BID) | RESPIRATORY_TRACT | Status: DC
  Administered 2016-05-16 – 2016-05-20 (×6): 2 via RESPIRATORY_TRACT
  Filled 2016-05-16: qty 8.8

## 2016-05-16 MED ORDER — OXYBUTYNIN CHLORIDE ER 5 MG PO TB24
10.0000 mg | ORAL_TABLET | Freq: Every day | ORAL | Status: DC
Start: 2016-05-16 — End: 2016-05-20
  Administered 2016-05-16 – 2016-05-19 (×4): 10 mg via ORAL
  Filled 2016-05-16 (×4): qty 2

## 2016-05-16 MED ORDER — ONDANSETRON HCL 4 MG/2ML IJ SOLN
INTRAMUSCULAR | Status: DC | PRN
  Administered 2016-05-16: 4 mg via INTRAVENOUS

## 2016-05-16 MED ORDER — CLINDAMYCIN PHOSPHATE 900 MG/50ML IV SOLN
900.0000 mg | INTRAVENOUS | Status: AC
  Administered 2016-05-16: 900 mg via INTRAVENOUS

## 2016-05-16 MED ORDER — INSULIN GLARGINE 100 UNIT/ML ~~LOC~~ SOLN
15.0000 [IU] | Freq: Every day | SUBCUTANEOUS | Status: DC
  Administered 2016-05-16 – 2016-05-19 (×4): 15 [IU] via SUBCUTANEOUS
  Filled 2016-05-16 (×4): qty 0.15

## 2016-05-16 MED ORDER — NICOTINE 14 MG/24HR TD PT24
14.0000 mg | MEDICATED_PATCH | Freq: Every day | TRANSDERMAL | Status: DC
  Administered 2016-05-16 – 2016-05-19 (×4): 14 mg via TRANSDERMAL
  Filled 2016-05-16 (×5): qty 1

## 2016-05-16 MED ORDER — METHOCARBAMOL 500 MG PO TABS
500.0000 mg | ORAL_TABLET | Freq: Four times a day (QID) | ORAL | Status: DC | PRN
  Administered 2016-05-17 – 2016-05-20 (×10): 500 mg via ORAL
  Filled 2016-05-16 (×11): qty 1

## 2016-05-16 MED ORDER — ENALAPRIL MALEATE 20 MG PO TABS
20.0000 mg | ORAL_TABLET | Freq: Two times a day (BID) | ORAL | Status: DC
Start: 2016-05-16 — End: 2016-05-20
  Administered 2016-05-16 – 2016-05-20 (×8): 20 mg via ORAL
  Filled 2016-05-16 (×8): qty 1

## 2016-05-16 MED ORDER — PANTOPRAZOLE SODIUM 40 MG PO TBEC
40.0000 mg | DELAYED_RELEASE_TABLET | Freq: Every day | ORAL | Status: DC
  Administered 2016-05-17 – 2016-05-20 (×4): 40 mg via ORAL
  Filled 2016-05-16 (×4): qty 1

## 2016-05-16 MED ORDER — CLINDAMYCIN PHOSPHATE 900 MG/50ML IV SOLN
INTRAVENOUS | Status: AC
  Filled 2016-05-16: qty 50

## 2016-05-16 MED ORDER — ACETAMINOPHEN 650 MG RE SUPP: 650.0000 mg | Freq: Four times a day (QID) | RECTAL | Status: DC | PRN

## 2016-05-16 MED ORDER — OXYCODONE HCL 5 MG PO TABS
5.0000 mg | ORAL_TABLET | ORAL | Status: DC | PRN
Start: 2016-05-16 — End: 2016-05-20
  Administered 2016-05-16 (×3): 5 mg via ORAL
  Administered 2016-05-17: 21:00:00 10 mg via ORAL
  Administered 2016-05-17: 5 mg via ORAL
  Administered 2016-05-18 (×4): 10 mg via ORAL
  Administered 2016-05-18: 22:00:00 5 mg via ORAL
  Administered 2016-05-18 – 2016-05-19 (×2): 10 mg via ORAL
  Administered 2016-05-19: 22:00:00 5 mg via ORAL
  Administered 2016-05-19 – 2016-05-20 (×6): 10 mg via ORAL
  Filled 2016-05-16 (×4): qty 2
  Filled 2016-05-16: qty 1
  Filled 2016-05-16: qty 2
  Filled 2016-05-16: qty 1
  Filled 2016-05-16 (×4): qty 2
  Filled 2016-05-16 (×2): qty 1
  Filled 2016-05-16 (×2): qty 2
  Filled 2016-05-16: qty 1
  Filled 2016-05-16 (×2): qty 2
  Filled 2016-05-16: qty 1
  Filled 2016-05-16: qty 2

## 2016-05-16 MED ORDER — ONDANSETRON HCL 4 MG/2ML IJ SOLN
4.0000 mg | Freq: Four times a day (QID) | INTRAMUSCULAR | Status: DC | PRN
  Administered 2016-05-16: 4 mg via INTRAVENOUS
  Filled 2016-05-16: qty 2

## 2016-05-16 MED ORDER — FENTANYL CITRATE (PF) 100 MCG/2ML IJ SOLN
INTRAMUSCULAR | Status: AC
  Filled 2016-05-16: qty 2

## 2016-05-16 MED ORDER — PROPOFOL 10 MG/ML IV BOLUS
INTRAVENOUS | Status: DC | PRN
  Administered 2016-05-16 (×4): 20 mg via INTRAVENOUS

## 2016-05-16 SURGICAL SUPPLY — 46 items
BAG ZIPLOCK 12X15 (MISCELLANEOUS) IMPLANT
BENZOIN TINCTURE PRP APPL 2/3 (GAUZE/BANDAGES/DRESSINGS) ×3 IMPLANT
BLADE SAW SGTL 18X1.27X75 (BLADE) ×2 IMPLANT
BLADE SAW SGTL 18X1.27X75MM (BLADE) ×1
CAPT HIP TOTAL 2 ×3 IMPLANT
CELLS DAT CNTRL 66122 CELL SVR (MISCELLANEOUS) ×1 IMPLANT
CHLORAPREP W/TINT 26ML (MISCELLANEOUS) ×3 IMPLANT
CLOSURE WOUND 1/2 X4 (GAUZE/BANDAGES/DRESSINGS) ×1
CLOTH BEACON ORANGE TIMEOUT ST (SAFETY) ×3 IMPLANT
COVER PERINEAL POST (MISCELLANEOUS) ×3 IMPLANT
DRAPE STERI IOBAN 125X83 (DRAPES) ×3 IMPLANT
DRAPE U-SHAPE 47X51 STRL (DRAPES) ×6 IMPLANT
DRESSING AQUACEL AG SP 3.5X10 (GAUZE/BANDAGES/DRESSINGS) ×1 IMPLANT
DRSG AQUACEL AG ADV 3.5X10 (GAUZE/BANDAGES/DRESSINGS) ×3 IMPLANT
DRSG AQUACEL AG SP 3.5X10 (GAUZE/BANDAGES/DRESSINGS) ×3
ELECT REM PT RETURN 9FT ADLT (ELECTROSURGICAL) ×3
ELECTRODE REM PT RTRN 9FT ADLT (ELECTROSURGICAL) ×1 IMPLANT
GAUZE XEROFORM 1X8 LF (GAUZE/BANDAGES/DRESSINGS) IMPLANT
GLOVE BIO SURGEON STRL SZ7.5 (GLOVE) ×3 IMPLANT
GLOVE BIOGEL PI IND STRL 6.5 (GLOVE) ×1 IMPLANT
GLOVE BIOGEL PI IND STRL 7.0 (GLOVE) ×1 IMPLANT
GLOVE BIOGEL PI IND STRL 7.5 (GLOVE) ×2 IMPLANT
GLOVE BIOGEL PI IND STRL 8 (GLOVE) ×2 IMPLANT
GLOVE BIOGEL PI INDICATOR 6.5 (GLOVE) ×2
GLOVE BIOGEL PI INDICATOR 7.0 (GLOVE) ×2
GLOVE BIOGEL PI INDICATOR 7.5 (GLOVE) ×4
GLOVE BIOGEL PI INDICATOR 8 (GLOVE) ×4
GLOVE ECLIPSE 8.0 STRL XLNG CF (GLOVE) ×3 IMPLANT
GLOVE SURG SS PI 7.0 STRL IVOR (GLOVE) ×6 IMPLANT
GOWN SPEC L3 MED W/TWL (GOWN DISPOSABLE) ×3 IMPLANT
GOWN STRL REUS W/TWL XL LVL3 (GOWN DISPOSABLE) ×9 IMPLANT
HANDPIECE INTERPULSE COAX TIP (DISPOSABLE) ×2
HOLDER FOLEY CATH W/STRAP (MISCELLANEOUS) ×3 IMPLANT
PACK ANTERIOR HIP CUSTOM (KITS) ×3 IMPLANT
RTRCTR WOUND ALEXIS 18CM MED (MISCELLANEOUS) ×3
SET HNDPC FAN SPRY TIP SCT (DISPOSABLE) ×1 IMPLANT
STAPLER VISISTAT 35W (STAPLE) IMPLANT
STRIP CLOSURE SKIN 1/2X4 (GAUZE/BANDAGES/DRESSINGS) ×2 IMPLANT
SUT ETHIBOND NAB CT1 #1 30IN (SUTURE) ×3 IMPLANT
SUT MNCRL AB 4-0 PS2 18 (SUTURE) ×3 IMPLANT
SUT VIC AB 0 CT1 36 (SUTURE) ×3 IMPLANT
SUT VIC AB 1 CT1 36 (SUTURE) ×3 IMPLANT
SUT VIC AB 2-0 CT1 27 (SUTURE) ×4
SUT VIC AB 2-0 CT1 TAPERPNT 27 (SUTURE) ×2 IMPLANT
TRAY FOLEY W/METER SILVER 16FR (SET/KITS/TRAYS/PACK) ×3 IMPLANT
YANKAUER SUCT BULB TIP 10FT TU (MISCELLANEOUS) ×3 IMPLANT

## 2016-05-16 NOTE — Anesthesia Postprocedure Evaluation (Signed)
Anesthesia Post Note  Patient: Deanna Gay  Procedure(s) Performed: Procedure(s) (LRB): LEFT TOTAL HIP ARTHROPLASTY ANTERIOR APPROACH (Left)  Patient location during evaluation: PACU Anesthesia Type: Spinal Level of consciousness: awake and alert Pain management: pain level controlled Vital Signs Assessment: post-procedure vital signs reviewed and stable Respiratory status: spontaneous breathing and respiratory function stable Cardiovascular status: blood pressure returned to baseline and stable Postop Assessment: spinal receding Anesthetic complications: no       Last Vitals:  Vitals:   05/16/16 1500 05/16/16 1515  BP: (!) 148/68 136/78  Pulse: 67 67  Resp: 18 19  Temp:      Last Pain:  Vitals:   05/16/16 1345  TempSrc:   PainSc: 0-No pain                 Nolon Nations

## 2016-05-16 NOTE — H&P (Signed)
TOTAL HIP ADMISSION H&P  Patient is admitted for left total hip arthroplasty.  Subjective:  Chief Complaint: left hip pain  HPI: Deanna Gay, 60 y.o. female, has a history of pain and functional disability in the left hip(s) due to arthritis and patient has failed non-surgical conservative treatments for greater than 12 weeks to include NSAID's and/or analgesics, use of assistive devices, weight reduction as appropriate and activity modification.  Onset of symptoms was gradual starting 2 years ago with rapidlly worsening course since that time.The patient noted no past surgery on the left hip(s).  Patient currently rates pain in the left hip at 10 out of 10 with activity. Patient has night pain, worsening of pain with activity and weight bearing, pain that interfers with activities of daily living and pain with passive range of motion. Patient has evidence of subchondral cysts, subchondral sclerosis, periarticular osteophytes and joint space narrowing by imaging studies. This condition presents safety issues increasing the risk of falls.  There is no current active infection.  Patient Active Problem List   Diagnosis Date Noted  . Unilateral primary osteoarthritis, left hip 05/16/2016  . Symptomatic anemia 02/27/2015   Past Medical History:  Diagnosis Date  . Arthritis   . Cancer ([place])    cervical  . COPD (chronic obstructive pulmonary disease) ([place])   . Depression   . Diabetes mellitus without complication ([place])   . GERD (gastroesophageal reflux disease)   . Hypertension   . Schizophrenia ([place])   . Sleep apnea     Past Surgical History:  Procedure Laterality Date  . APPENDECTOMY    . COLONOSCOPY WITH PROPOFOL N/A 12/25/2014   Procedure: COLONOSCOPY WITH PROPOFOL;  Surgeon: Josefine Class, MD;  Location: [facility] ENDOSCOPY;  Service: Endoscopy;  Laterality: N/A;  . ESOPHAGOGASTRODUODENOSCOPY (EGD) WITH PROPOFOL N/A 12/25/2014   Procedure: ESOPHAGOGASTRODUODENOSCOPY (EGD) WITH  PROPOFOL;  Surgeon: Josefine Class, MD;  Location[facility] ENDOSCOPY;  Service: Endoscopy;  Laterality: N/A;  . JOINT REPLACEMENT     right hip    Prescriptions Prior to Admission  Medication Sig Dispense Refill Last Dose  . albuterol (PROVENTIL HFA;VENTOLIN HFA) 108 (90 BASE) MCG/ACT inhaler Inhale 2 puffs into the lungs every 4 (four) hours as needed for wheezing or shortness of breath.    05/15/2016 at Unknown time  . benzonatate (TESSALON) 100 MG capsule Take 100 mg by mouth daily.   0 05/15/2016 at Unknown time  . budesonide-formoterol (SYMBICORT) 160-4.5 MCG/ACT inhaler Inhale 2 puffs into the lungs 2 (two) times daily.   05/15/2016 at Unknown time  . cyclobenzaprine (FLEXERIL) 10 MG tablet Take 10 mg by mouth daily as needed for muscle spasms.   0 Past Week at Unknown time  . dicyclomine (BENTYL) 10 MG capsule Take 10 mg by mouth 3 (three) times daily as needed for spasms.   05/16/2016 at Unknown time  . diltiazem (CARDIZEM CD) 180 MG 24 hr capsule Take 180 mg by mouth daily.   05/16/2016 at Unknown time  . enalapril (VASOTEC) 20 MG tablet take 1 tablet by mouth twice a day   05/15/2016 at Unknown time  . hydrochlorothiazide (HYDRODIURIL) 25 MG tablet Take 25 mg by mouth daily.   05/15/2016 at Unknown time  . insulin glargine (LANTUS) 100 UNIT/ML injection Inject 15 Units into the skin at bedtime.   05/15/2016 at Unknown time  . iron polysaccharides (NIFEREX) 150 MG capsule Take 150 mg by mouth 2 (two) times daily.   05/15/2016 at Unknown time  .  metFORMIN (GLUCOPHAGE) 1000 MG tablet Take 1,000 mg by mouth 2 (two) times daily with a meal.   05/15/2016 at Unknown time  . omeprazole (PRILOSEC) 20 MG capsule Take 20 mg by mouth 2 (two) times daily before a meal.   0 05/15/2016 at Unknown time  . oxybutynin (DITROPAN-XL) 10 MG 24 hr tablet Take 10 mg by mouth at bedtime.   05/15/2016 at Unknown time  . oxyCODONE-acetaminophen (PERCOCET) 7.5-325 MG per tablet Take 1 tablet by mouth every 6  (six) hours as needed for severe pain.   05/15/2016 at Unknown time  . triamcinolone (KENALOG) 0.1 % paste Use as directed 1 application in the mouth or throat daily.  1 Past Month at Unknown time  . triamcinolone cream (KENALOG) 0.1 % Apply 1 application topically 2 (two) times daily.   Past Month at Unknown time  . buPROPion (WELLBUTRIN) 75 MG tablet Take 75 mg by mouth 2 (two) times daily.   More than a month at Unknown time  . ipratropium-albuterol (DUONEB) 0.5-2.5 (3) MG/3ML SOLN Take 3 mLs by nebulization every 6 (six) hours as needed.   More than a month at Unknown time   Allergies  Allergen Reactions  . Iodine Swelling  . Penicillins Swelling    Has patient had a PCN reaction causing immediate rash, facial/tongue/throat swelling, SOB or lightheadedness with hypotension: Yes Has patient had a PCN reaction causing severe rash involving mucus membranes or skin necrosis: Yes Has patient had a PCN reaction that required hospitalization: No Has patient had a PCN reaction occurring within the last 10 years: No If all of the above answers are "NO", then may proceed with Cephalosporin use.     Social History  Substance Use Topics  . Smoking status: Current Every Day Smoker    Packs/day: 1.00  . Smokeless tobacco: Never Used  . Alcohol use No    Family History  Problem Relation Age of Onset  . Hypertension Mother   . Diabetes Mellitus II Mother   . Breast cancer Neg Hx      Review of Systems  Musculoskeletal: Positive for joint pain.  All other systems reviewed and are negative.   Objective:  Physical Exam  Constitutional: She is oriented to person, place, and time. She appears well-developed and well-nourished.  HENT:  Head: Normocephalic and atraumatic.  Eyes: EOM are normal.  Neck: Normal range of motion. Neck supple.  Cardiovascular: Normal rate.   Respiratory: Effort normal and breath sounds normal.  GI: Soft. Bowel sounds are normal.  Musculoskeletal:       Left  hip: She exhibits decreased range of motion, decreased strength, tenderness and bony tenderness.  Neurological: She is alert and oriented to person, place, and time.  Skin: Skin is warm and dry.  Psychiatric: She has a normal mood and affect.    Vital signs in last 24 hours: Temp:  [97.9 F (36.6 C)] 97.9 F (36.6 C) (12/29 0854) Pulse Rate:  [101] 101 (12/29 0854) Resp:  [16] 16 (12/29 0854) BP: (136)/(82) 136/82 (12/29 0854) SpO2:  [96 %] 96 % (12/29 0854) Weight:  [199 lb (90.3 kg)] 199 lb (90.3 kg) (12/29 0906)  Labs:   Estimated body mass index is 33.12 kg/m as calculated from the following:   Height as of this encounter: 5' 5"  (1.651 m).   Weight as of this encounter: 199 lb (90.3 kg).   Imaging Review Plain radiographs demonstrate severe degenerative joint disease of the left hip(s). The bone quality appears  to be good for age and reported activity level.  Assessment/Plan:  End stage arthritis, left hip(s)  The patient history, physical examination, clinical judgement of the provider and imaging studies are consistent with end stage degenerative joint disease of the left hip(s) and total hip arthroplasty is deemed medically necessary. The treatment options including medical management, injection therapy, arthroscopy and arthroplasty were discussed at length. The risks and benefits of total hip arthroplasty were presented and reviewed. The risks due to aseptic loosening, infection, stiffness, dislocation/subluxation,  thromboembolic complications and other imponderables were discussed.  The patient acknowledged the explanation, agreed to proceed with the plan and consent was signed. Patient is being admitted for inpatient treatment for surgery, pain control, PT, OT, prophylactic antibiotics, VTE prophylaxis, progressive ambulation and ADL's and discharge planning.The patient is planning to be discharged home with home health services

## 2016-05-16 NOTE — Brief Op Note (Signed)
05/16/2016  1:18 PM  PATIENT:  Deanna Gay  60 y.o. female  PRE-OPERATIVE DIAGNOSIS:  severe osteoarthritis left hip  POST-OPERATIVE DIAGNOSIS:  severe osteoarthritis left hip  PROCEDURE:  Procedure(s): LEFT TOTAL HIP ARTHROPLASTY ANTERIOR APPROACH (Left)  SURGEON:  Surgeon(s) and Role:    * Mcarthur Rossetti, MD - Primary  PHYSICIAN ASSISTANT: Benita Stabile, PA-C  ANESTHESIA:   spinal  EBL:  Total I/O In: 1000 [I.V.:1000] Out: 200 [Urine:100; Blood:100]  COUNTS:  YES  DICTATION: .Other Dictation: Dictation Number [id number]  PLAN OF CARE: Admit to inpatient   PATIENT DISPOSITION:  PACU - hemodynamically stable.   Delay start of Pharmacological VTE agent (>24hrs) due to surgical blood loss or risk of bleeding: no

## 2016-05-16 NOTE — Transfer of Care (Signed)
Immediate Anesthesia Transfer of Care Note  Patient: Deanna Gay  Procedure(s) Performed: Procedure(s): LEFT TOTAL HIP ARTHROPLASTY ANTERIOR APPROACH (Left)  Patient Location: PACU  Anesthesia Type:Spinal  Level of Consciousness:  sedated, patient cooperative and responds to stimulation  Airway & Oxygen Therapy:Patient Spontanous Breathing and Patient connected to face mask oxgen  Post-op Assessment:  Report given to PACU RN and Post -op Vital signs reviewed and stable  Post vital signs:  Reviewed and stable  Last Vitals:  Vitals:   05/16/16 0854 05/16/16 1345  BP: 136/82   Pulse: (!) 101   Resp: 16   Temp: 36.6 C 33.4 C    Complications: No apparent anesthesia complications

## 2016-05-16 NOTE — Anesthesia Procedure Notes (Signed)
Spinal  Patient location during procedure: OR Start time: 05/16/2016 12:04 PM End time: 05/16/2016 12:11 PM Reason for block: at surgeon's request Staffing Resident/CRNA: Anne Fu Performed: resident/CRNA  Preanesthetic Checklist Completed: patient identified, site marked, surgical consent, pre-op evaluation, timeout performed, IV checked, risks and benefits discussed and monitors and equipment checked Spinal Block Patient position: sitting Prep: ChloraPrep Patient monitoring: heart rate, continuous pulse ox and blood pressure Approach: right paramedian Location: L2-3 Injection technique: single-shot Needle Needle type: Pencan  Needle gauge: 24 G Needle length: 9 cm Assessment Sensory level: T6 Additional Notes Expiration date of kit checked and confirmed. Patient tolerated procedure well, without complications. X 1 attempt with noted clear CSF return. Loss of motor and sensory on exam post injection.

## 2016-05-16 NOTE — Anesthesia Preprocedure Evaluation (Signed)
Anesthesia Evaluation    Airway Mallampati: II  TM Distance: >3 FB Neck ROM: Full    Dental no notable dental hx.    Pulmonary sleep apnea , COPD, Current Smoker,    Pulmonary exam normal breath sounds clear to auscultation       Cardiovascular hypertension, Pt. on medications Normal cardiovascular exam Rhythm:Regular Rate:Normal     Neuro/Psych PSYCHIATRIC DISORDERS Depression Schizophrenia    GI/Hepatic GERD  ,  Endo/Other  diabetes, Type 2, Oral Hypoglycemic Agents  Renal/GU      Musculoskeletal  (+) Arthritis ,   Abdominal   Peds  Hematology  (+) anemia ,   Anesthesia Other Findings   Reproductive/Obstetrics                            Anesthesia Physical Anesthesia Plan  ASA: III  Anesthesia Plan: Spinal   Post-op Pain Management:    Induction:   Airway Management Planned:   Additional Equipment:   Intra-op Plan:   Post-operative Plan:   Informed Consent: I have reviewed the patients History and Physical, chart, labs and discussed the procedure including the risks, benefits and alternatives for the proposed anesthesia with the patient or authorized representative who has indicated his/her understanding and acceptance.   Dental advisory given  Plan Discussed with: CRNA  Anesthesia Plan Comments:         Anesthesia Quick Evaluation

## 2016-05-17 LAB — BASIC METABOLIC PANEL
ANION GAP: 11 (ref 5–15)
BUN: 13 mg/dL (ref 6–20)
CHLORIDE: 99 mmol/L — AB (ref 101–111)
CO2: 27 mmol/L (ref 22–32)
Calcium: 7.4 mg/dL — ABNORMAL LOW (ref 8.9–10.3)
Creatinine, Ser: 0.71 mg/dL (ref 0.44–1.00)
GFR calc Af Amer: >60 mL/min (ref >60)
GLUCOSE: 103 mg/dL — AB (ref 65–99)
POTASSIUM: 3.7 mmol/L (ref 3.5–5.1)
Sodium: 137 mmol/L (ref 135–145)

## 2016-05-17 LAB — CBC
HCT: 31.5 % — ABNORMAL LOW (ref 36.0–46.0)
Hemoglobin: 10.4 g/dL — ABNORMAL LOW (ref 12.0–15.0)
MCH: 27.4 pg (ref 26.0–34.0)
MCHC: 33 g/dL (ref 30.0–36.0)
MCV: 83.1 fL (ref 78.0–100.0)
PLATELETS: 332 10*3/uL (ref 150–400)
RBC: 3.79 MIL/uL — AB (ref 3.87–5.11)
RDW: 15 % (ref 11.5–15.5)
WBC: 10.3 10*3/uL (ref 4.0–10.5)

## 2016-05-17 LAB — GLUCOSE, CAPILLARY
GLUCOSE-CAPILLARY: 85 mg/dL (ref 65–99)
GLUCOSE-CAPILLARY: 119 mg/dL — AB (ref 65–99)
GLUCOSE-CAPILLARY: 142 mg/dL — AB (ref 65–99)

## 2016-05-17 NOTE — Discharge Instructions (Signed)

## 2016-05-17 NOTE — Progress Notes (Signed)
OT Cancellation Note  Patient Details Name: Deanna Gay MRN: [id number] DOB: 01-31-1956   Cancelled Treatment:    Reason Eval/Treat Not Completed: OT screened, no needs identified, will sign off -- Plan is SNF. Will defer all OT needs to SNF.  Wilson Dusenbery A 05/17/2016, 9:31 AM

## 2016-05-17 NOTE — Progress Notes (Signed)
Subjective: 1 Day Post-Op Procedure(s) (LRB): LEFT TOTAL HIP ARTHROPLASTY ANTERIOR APPROACH (Left) Patient reports pain as moderate.  Asymptomatic acute blood loss anemia from surgery.  Objective: Vital signs in last 24 hours: Temp:  [97.9 F (36.6 C)-99 F (37.2 C)] 98.6 F (37 C) (12/30 0636) Pulse Rate:  [62-109] 87 (12/30 0636) Resp:  [14-20] 18 (12/30 0636) BP: (97-148)/(56-94) 125/63 (12/30 0636) SpO2:  [91 %-100 %] 95 % (12/30 0636) Weight:  [199 lb (90.3 kg)] 199 lb (90.3 kg) (12/29 1610)  Intake/Output from previous day: 12/29 0701 - 12/30 0700 In: 2394.6 [P.O.:240; I.V.:2104.6; IV Piggyback:50] Out: 1600 [Urine:1500; Blood:100] Intake/Output this shift: No intake/output data recorded.   Recent Labs  05/17/16 0436  HGB 10.4*    Recent Labs  05/17/16 0436  WBC 10.3  RBC 3.79*  HCT 31.5*  PLT 332    Recent Labs  05/17/16 0436  NA 137  K 3.7  CL 99*  CO2 27  BUN 13  CREATININE 0.71  GLUCOSE 103*  CALCIUM 7.4*   No results for input(s): LABPT, INR in the last 72 hours.  Sensation intact distally Intact pulses distally Dorsiflexion/Plantar flexion intact Incision: dressing C/D/I  Assessment/Plan: 1 Day Post-Op Procedure(s) (LRB): LEFT TOTAL HIP ARTHROPLASTY ANTERIOR APPROACH (Left) Up with therapy Discharge to SNF early next week.  Mcarthur Rossetti 05/17/2016, 7:38 AM

## 2016-05-17 NOTE — Progress Notes (Signed)
Pt is on NIV at this time tolerating it well no distress noted. Pt stated that she wears 7cmh2o at home on her cpap. Pt settings were adjusted per pt home regimen and request. Pt is tolerating it well, and states that it is comfortable. Humidity provided .

## 2016-05-17 NOTE — Care Management Note (Signed)
Case Management Note  Patient Details  Name: LAKEISA HENINGER MRN: [id number] Date of Birth: 12/30/1955  Subjective/Objective:   Left THA                 Action/Plan: Discharge Planning: NCM spoke to pt and states she lives at home alone. Has two adult children but their assistance is limited. Requesting SNF rehab. States she went to rehab when she had her last hip replacement. NCM contacted CSW with referral.   PCP  Kirk Ruths MD  Expected Discharge Date:                  Expected Discharge Plan:  [place]  In-House Referral:  Clinical Social Work  Discharge planning Services  CM Consult  Post Acute Care Choice:  NA Choice offered to:  NA  DME Arranged:  N/A DME Agency:  NA  HH Arranged:  NA HH Agency:  NA  Status of Service:  Completed, signed off  If discussed at [place] of Stay Meetings, dates discussed:    Additional Comments:  Erenest Rasher, RN 05/17/2016, 3:18 PM

## 2016-05-17 NOTE — Progress Notes (Signed)
Patient prefers self placement of CPAP when ready for bed.  Pt to notify RT if any assistance is needed throughout the night.  RT to monitor and assess as needed.

## 2016-05-17 NOTE — Evaluation (Signed)
Physical Therapy Evaluation Patient Details Name: Deanna Gay MRN: [id number] DOB: 1955/09/16 Today's Date: 05/17/2016   History of Present Illness  s/p L  DATHA; PMHx: DM, R THA  Clinical Impression  Pt is s/p THA resulting in the deficits listed below (see PT Problem List). * Pt will benefit from skilled PT to increase their independence and safety with mobility to allow discharge to the venue listed below.      Follow Up Recommendations SNF    Equipment Recommendations  None recommended by PT    Recommendations for Other Services       Precautions / Restrictions Precautions Precautions: Fall Restrictions Weight Bearing Restrictions: No Other Position/Activity Restrictions: WBAT      Mobility  Bed Mobility Overal bed mobility: Needs Assistance Bed Mobility: Sit to Supine       Sit to supine: Mod assist;Min assist   General bed mobility comments: assist with LEs  Transfers Overall transfer level: Needs assistance Equipment used: Rolling walker (2 wheeled) Transfers: Sit to/from [employer] Sit to Stand: Min assist Stand pivot transfers: Min assist       General transfer comment: cues for hand placement and overall safety  Ambulation/Gait Ambulation/Gait assistance: Min assist Ambulation Distance (Feet): 55 Feet Assistive device: Rolling walker (2 wheeled) Gait Pattern/deviations: Step-to pattern;Decreased weight shift to left;Decreased step length - left;Decreased step length - right;Trunk flexed     General Gait Details: cues for RW position and posture, pt limited by pain L hip  Stairs            Wheelchair Mobility    Modified Rankin (Stroke Patients Only)       Balance Overall balance assessment: Needs assistance Sitting-balance support: No upper extremity supported;Feet supported Sitting balance-Leahy Scale: Good     Standing balance support: Single extremity supported;During functional activity Standing  balance-Leahy Scale: Poor Standing balance comment: support fro balance during toileting/hygiene                             Pertinent Vitals/Pain Pain Assessment: 0-10 Pain Location: L hip Pain Descriptors / Indicators: Burning Pain Intervention(s): Limited activity within patient's tolerance;Monitored during session    Home Living   Living Arrangements: Children   Type of Home: House Home Access: Stairs to enter   Technical brewer of Steps: 6 Home Layout: One level Home Equipment: None Additional Comments: pt reports to PT she does not have any reliable help at home    Prior Function Level of Independence: Independent               Hand Dominance        Extremity/Trunk Assessment   Upper Extremity Assessment Upper Extremity Assessment: Overall WFL for tasks assessed    Lower Extremity Assessment Lower Extremity Assessment: LLE deficits/detail LLE Deficits / Details: AAROM grossly WFL, limited by anticipated post op pain and weakness       Communication   Communication: No difficulties  Cognition Arousal/Alertness: Awake/alert Behavior During Therapy: WFL for tasks assessed/performed Overall Cognitive Status: Within Functional Limits for tasks assessed                      General Comments      Exercises     Assessment/Plan    PT Assessment Patient needs continued PT services  PT Problem List Decreased strength;Decreased range of motion;Decreased activity tolerance;Decreased mobility;Decreased knowledge of use of DME;Decreased safety awareness  PT Treatment Interventions DME instruction;Gait training;Functional mobility training;Therapeutic activities;Therapeutic exercise;Patient/family education    PT Goals (Current goals can be found in the Care Plan section)  Acute Rehab PT Goals Patient Stated Goal: to get better PT Goal Formulation: With patient Time For Goal Achievement: 05/24/16 Potential to Achieve  Goals: Good    Frequency Min 3X/week   Barriers to discharge        Co-evaluation               End of Session Equipment Utilized During Treatment: Gait belt Activity Tolerance: Patient tolerated treatment well;Patient limited by pain;Patient limited by fatigue Patient left: in bed;with call bell/phone within reach;with bed alarm set Nurse Communication: Mobility status         Time: 0459-9774 PT Time Calculation (min) (ACUTE ONLY): 26 min   Charges:   PT Evaluation $PT Eval Low Complexity: 1 Procedure PT Treatments $Gait Training: 8-22 mins   PT G Codes:        Sanii Kukla Jun 07, 2016, 12:41 PM

## 2016-05-17 NOTE — Progress Notes (Signed)
Foley removed at 0703.

## 2016-05-17 NOTE — Progress Notes (Signed)
   05/17/16 1600  PT Visit Information  Last PT Received On 05/17/16  Assistance Needed +1  History of Present Illness s/p L  DATHA; PMHx: DM, R THA  Subjective Data  Patient Stated Goal to get better  Precautions  Precautions Fall  Restrictions  Weight Bearing Restrictions No  Other Position/Activity Restrictions WBAT  Pain Assessment  Pain Assessment 0-10  Pain Score 5  Pain Location L hip  Pain Descriptors / Indicators Burning;Cramping  Pain Intervention(s) Limited activity within patient's tolerance;Monitored during session;Repositioned  Cognition  Arousal/Alertness Awake/alert  Behavior During Therapy WFL for tasks assessed/performed  Overall Cognitive Status Within Functional Limits for tasks assessed  Bed Mobility  Overal bed mobility Needs Assistance  Bed Mobility Supine to Sit;Sit to Supine  Supine to sit Min assist  Sit to supine Min assist  General bed mobility comments assist with LEs  Transfers  Overall transfer level Needs assistance  Equipment used Rolling walker (2 wheeled)  Transfers Sit to/from [employer] Transfers  Sit to Stand Min assist  Stand pivot transfers Min assist  General transfer comment cues for hand placement and overall safety  Ambulation/Gait  General Gait Details (NT d/t pain after transfer and pt takign constant calls )  Balance  Sitting balance-Leahy Scale Good  Standing balance-Leahy Scale Poor  Standing balance comment support for balance during toileting/hygiene  PT - End of Session  Equipment Utilized During Treatment Gait belt  Activity Tolerance Patient tolerated treatment well;Patient limited by pain  Patient left in bed;with call bell/phone within reach;with bed alarm set  PT - Assessment/Plan  PT Plan Current plan remains appropriate  PT Frequency (ACUTE ONLY) 7X/week  Follow Up Recommendations SNF  PT equipment None recommended by PT  PT Goal Progression  Progress towards PT goals Progressing toward goals  Acute  Rehab PT Goals  PT Goal Formulation With patient  Time For Goal Achievement 05/24/16  Potential to Achieve Goals Good  PT Time Calculation  PT Start Time (ACUTE ONLY) 1458  PT Stop Time (ACUTE ONLY) 1522  PT Time Calculation (min) (ACUTE ONLY) 24 min  PT General Charges  $$ ACUTE PT VISIT 1 Procedure  PT Treatments  $Therapeutic Activity 23-37 mins

## 2016-05-18 LAB — GLUCOSE, CAPILLARY
Glucose-Capillary: 91 mg/dL (ref 65–99)
Glucose-Capillary: 113 mg/dL — ABNORMAL HIGH (ref 65–99)
Glucose-Capillary: 89 mg/dL (ref 65–99)
Glucose-Capillary: 138 mg/dL — ABNORMAL HIGH (ref 65–99)

## 2016-05-18 MED ORDER — OXYCODONE-ACETAMINOPHEN 7.5-325 MG PO TABS: 1.0000 | ORAL_TABLET | Freq: Four times a day (QID) | ORAL | 0 refills | Status: AC | PRN

## 2016-05-18 MED ORDER — CYCLOBENZAPRINE HCL 10 MG PO TABS: 10.0000 mg | ORAL_TABLET | Freq: Three times a day (TID) | ORAL | 0 refills | Status: DC | PRN

## 2016-05-18 MED ORDER — GABAPENTIN 100 MG PO CAPS
100.0000 mg | ORAL_CAPSULE | Freq: Three times a day (TID) | ORAL | Status: DC
  Administered 2016-05-18 – 2016-05-20 (×7): 100 mg via ORAL
  Filled 2016-05-18 (×6): qty 1

## 2016-05-18 MED ORDER — ASPIRIN 81 MG PO CHEW: 81.0000 mg | CHEWABLE_TABLET | Freq: Two times a day (BID) | ORAL | 0 refills | Status: DC

## 2016-05-18 NOTE — Progress Notes (Signed)
Physical Therapy Treatment Patient Details Name: Deanna Gay MRN: [id number] DOB: [birth year] Today's Date: 05/18/2016    History of Present Illness s/p L  DATHA; PMHx: DM, R THA    PT Comments    Pt progressing, is mildly impulsive/unsafe and would benefit from SNF  Follow Up Recommendations  SNF     Equipment Recommendations  None recommended by PT    Recommendations for Other Services       Precautions / Restrictions Precautions Precautions: Fall Restrictions Weight Bearing Restrictions: No Other Position/Activity Restrictions: WBAT    Mobility  Bed Mobility Overal bed mobility: Needs Assistance Bed Mobility: Rolling;Sidelying to Sit Rolling: Supervision Sidelying to sit: Supervision          Transfers   Equipment used: Rolling walker (2 wheeled) Transfers: Sit to/from Stand Sit to Stand: Min guard         General transfer comment: cues for hand placement and overall safety; repeated x2  Ambulation/Gait Ambulation/Gait assistance: Min assist Ambulation Distance (Feet): 11 Feet Assistive device: Rolling walker (2 wheeled) Gait Pattern/deviations: Step-to pattern;Decreased weight shift to left;Decreased step length - left;Decreased step length - right;Trunk flexed Gait velocity: gait distance limited by pain/burning   General Gait Details: cues for RW position and posture, pt limited by pain L hip   Stairs            Wheelchair Mobility    Modified Rankin (Stroke Patients Only)       Balance Overall balance assessment: Needs assistance Sitting-balance support: No upper extremity supported;Feet supported Sitting balance-Leahy Scale: Good       Standing balance-Leahy Scale: Poor                      Cognition Arousal/Alertness: Awake/alert Behavior During Therapy: WFL for tasks assessed/performed Overall Cognitive Status: Within Functional Limits for tasks assessed                 General Comments: pt is  very cooperative but talks on phone a lot, has difficulty focusing and processing input at times; she is perseveratign on the burning in her thigh/incision area    Exercises      General Comments        Pertinent Vitals/Pain Pain Assessment: 0-10 Pain Score: 6  Pain Location: L hip Pain Descriptors / Indicators: Burning;Cramping Pain Intervention(s): Limited activity within patient's tolerance;Monitored during session;Repositioned;Ice applied    Home Living                      Prior Function            PT Goals (current goals can now be found in the care plan section) Acute Rehab PT Goals Patient Stated Goal: to get better PT Goal Formulation: With patient Time For Goal Achievement: 05/24/16 Potential to Achieve Goals: Good Progress towards PT goals: Progressing toward goals    Frequency    7X/week      PT Plan Current plan remains appropriate    Co-evaluation             End of Session Equipment Utilized During Treatment: Gait belt Activity Tolerance: Patient tolerated treatment well;Patient limited by pain Patient left: in chair;with call bell/phone within reach;with chair alarm set     Time: 5456-2563 PT Time Calculation (min) (ACUTE ONLY): 24 min  Charges:  $Gait Training: 8-22 mins $Therapeutic Exercise: 8-22 mins  G CodesKenyon Ana 05/18/2016, 10:30 AM

## 2016-05-18 NOTE — Progress Notes (Signed)
Subjective: 2 Days Post-Op Procedure(s) (LRB): LEFT TOTAL HIP ARTHROPLASTY ANTERIOR APPROACH (Left) Patient reports pain as moderate.  No acute changes.  Working with therapy.  Objective: Vital signs in last 24 hours: Temp:  [98.2 F (36.8 C)-99.4 F (37.4 C)] 98.2 F (36.8 C) (12/31 0621) Pulse Rate:  [77-95] 77 (12/31 0621) Resp:  [17-18] 18 (12/31 0621) BP: (109-126)/(58-78) 122/58 (12/31 1053) SpO2:  [92 %-100 %] 100 % (12/31 0621)  Intake/Output from previous day: 12/30 0701 - 12/31 0700 In: 2997.8 [P.O.:2374; I.V.:623.8] Out: 1250 [Urine:1250] Intake/Output this shift: Total I/O In: 240 [P.O.:240] Out: -    Recent Labs  05/17/16 0436  HGB 10.4*    Recent Labs  05/17/16 0436  WBC 10.3  RBC 3.79*  HCT 31.5*  PLT 332    Recent Labs  05/17/16 0436  NA 137  K 3.7  CL 99*  CO2 27  BUN 13  CREATININE 0.71  GLUCOSE 103*  CALCIUM 7.4*   No results for input(s): LABPT, INR in the last 72 hours.  Sensation intact distally Intact pulses distally Dorsiflexion/Plantar flexion intact Incision: dressing C/D/I  Assessment/Plan: 2 Days Post-Op Procedure(s) (LRB): LEFT TOTAL HIP ARTHROPLASTY ANTERIOR APPROACH (Left) Up with therapy Plan for discharge tomorrow Discharge to SNF  Deanna Gay 05/18/2016, 11:43 AM

## 2016-05-18 NOTE — Progress Notes (Signed)
   05/18/16 1500  PT Visit Information  Last PT Received On 05/18/16; pt doing better this pm, incr gait distance, still somewhat limited by pain, requires multiple standing rest breaks during gait d/t pain  Assistance Needed +1  History of Present Illness s/p L  DATHA; PMHx: DM, R THA  Subjective Data  Patient Stated Goal to get better  Precautions  Precautions Fall  Restrictions  Other Position/Activity Restrictions WBAT  Pain Assessment  Pain Assessment 0-10  Pain Score 5  Pain Location L hip  Pain Descriptors / Indicators Burning;Sore  Pain Intervention(s) Limited activity within patient's tolerance;Monitored during session;Premedicated before session;Repositioned;Ice applied  Cognition  Arousal/Alertness Awake/alert  Behavior During Therapy WFL for tasks assessed/performed  Overall Cognitive Status Within Functional Limits for tasks assessed  Bed Mobility  Overal bed mobility Needs Assistance  Bed Mobility Rolling;Sidelying to Sit;Sit to Sidelying  Rolling Supervision  Sidelying to sit Supervision  Sit to sidelying Supervision  General bed mobility comments uses RLE to assist LLE  Transfers  Overall transfer level Needs assistance  Equipment used Rolling walker (2 wheeled)  Transfers Sit to/from Stand  Sit to Stand Min guard  General transfer comment cues for hand placement and overall safety  Ambulation/Gait  Ambulation/Gait assistance Min assist;Min guard  Ambulation Distance (Feet) 90 Feet (10' more)  Assistive device Rolling walker (2 wheeled)  Gait Pattern/deviations Step-to pattern;Decreased weight shift to left;Decreased step length - left;Decreased step length - right;Trunk flexed  General Gait Details cues for RW position and posture, sequence  Balance  Standing balance-Leahy Scale Fair  Standing balance comment able to stand, perform peri care and wash hands without support  PT - End of Session  Equipment Utilized During Treatment Gait belt  Activity  Tolerance Patient tolerated treatment well  Patient left in bed;with call bell/phone within reach;with bed alarm set  Nurse Communication Mobility status  PT - Assessment/Plan  PT Plan Current plan remains appropriate  PT Frequency (ACUTE ONLY) 7X/week  Follow Up Recommendations SNF  PT equipment None recommended by PT  PT Goal Progression  Progress towards PT goals Progressing toward goals  Acute Rehab PT Goals  PT Goal Formulation With patient  Time For Goal Achievement 05/24/16  Potential to Achieve Goals Good  PT Time Calculation  PT Start Time (ACUTE ONLY) 1359  PT Stop Time (ACUTE ONLY) 1425  PT Time Calculation (min) (ACUTE ONLY) 26 min  PT General Charges  $$ ACUTE PT VISIT 1 Procedure  PT Treatments  $Gait Training 23-37 mins

## 2016-05-19 LAB — GLUCOSE, CAPILLARY
GLUCOSE-CAPILLARY: 112 mg/dL — AB (ref 65–99)
GLUCOSE-CAPILLARY: 108 mg/dL — AB (ref 65–99)
GLUCOSE-CAPILLARY: 126 mg/dL — AB (ref 65–99)
Glucose-Capillary: 132 mg/dL — ABNORMAL HIGH (ref 65–99)
Glucose-Capillary: 96 mg/dL (ref 65–99)

## 2016-05-19 NOTE — Progress Notes (Signed)
Subjective: 3 Days Post-Op Procedure(s) (LRB): LEFT TOTAL HIP ARTHROPLASTY ANTERIOR APPROACH (Left) Patient reports pain as moderate.  Hip and vitals stable.  Objective: Vital signs in last 24 hours: Temp:  [97.9 F (36.6 C)-98.7 F (37.1 C)] 97.9 F (36.6 C) (01/01 0549) Pulse Rate:  [70-83] 83 (01/01 0549) Resp:  [16] 16 (01/01 0549) BP: (115-128)/(54-70) 128/63 (01/01 0549) SpO2:  [96 %-97 %] 96 % (01/01 0549)  Intake/Output from previous day: 12/31 0701 - 01/01 0700 In: 1800 [P.O.:1800] Out: -  Intake/Output this shift: No intake/output data recorded.   Recent Labs  05/17/16 0436  HGB 10.4*    Recent Labs  05/17/16 0436  WBC 10.3  RBC 3.79*  HCT 31.5*  PLT 332    Recent Labs  05/17/16 0436  NA 137  K 3.7  CL 99*  CO2 27  BUN 13  CREATININE 0.71  GLUCOSE 103*  CALCIUM 7.4*   No results for input(s): LABPT, INR in the last 72 hours.  Sensation intact distally Intact pulses distally Dorsiflexion/Plantar flexion intact Incision: scant drainage  Assessment/Plan: 3 Days Post-Op Procedure(s) (LRB): LEFT TOTAL HIP ARTHROPLASTY ANTERIOR APPROACH (Left) Up with therapy Discharge to SNF likely tomorrow due to today being a holiday.  Mcarthur Rossetti 05/19/2016, 8:12 AM

## 2016-05-19 NOTE — Clinical Social Work Placement (Signed)
   CLINICAL SOCIAL WORK PLACEMENT  NOTE  Date:  05/19/2016  Patient Details  Name: GALENA LOGIE MRN: [id number] Date of Birth: 10-20-1955  Clinical Social Work is seeking post-discharge placement for this patient at the [place] level of care (*CSW will initial, date and re-position this form in  chart as items are completed):  Yes   Patient/family provided with [facility] Work Department's list of facilities offering this level of care within the geographic area requested by the patient (or if unable, by the patient's family).  Yes   Patient/family informed of their freedom to choose among providers that offer the needed level of care, that participate in Medicare, Medicaid or managed care program needed by the patient, have an available bed and are willing to accept the patient.  Yes   Patient/family informed of [facility]'s ownership interest in [facility] and [facility], as well as of the fact that they are under no obligation to receive care at these facilities.  PASRR submitted to EDS on 05/19/16     PASRR number received on       Existing PASRR number confirmed on       FL2 transmitted to all facilities in geographic area requested by pt/family on 05/19/16     FL2 transmitted to all facilities within larger geographic area on       Patient informed that his/her managed care company has contracts with or will negotiate with certain facilities, including the following:        Yes   Patient/family informed of bed offers received.  Patient chooses bed at [facility]     Physician recommends and patient chooses bed at      Patient to be transferred to [facility] on  .  Patient to be transferred to facility by       Patient family notified on   of transfer.  Name of family member notified:        PHYSICIAN       Additional Comment:    _______________________________________________ Luretha Rued, [place] 05/19/2016, 2:59 PM

## 2016-05-19 NOTE — Progress Notes (Addendum)
Physical Therapy Treatment Patient Details Name: Deanna Gay MRN: [id number] DOB: [birth year] Today's Date: 05/19/2016    History of Present Illness s/p L  DATHA; PMHx: DM, R THA    PT Comments    Pt continues to improve, she feels she needs to go to rehab to get more independent  Follow Up Recommendations  SNF     Equipment Recommendations  None recommended by PT    Recommendations for Other Services       Precautions / Restrictions Precautions Precautions: Fall Restrictions Weight Bearing Restrictions: No Other Position/Activity Restrictions: WBAT    Mobility  Bed Mobility Overal bed mobility: Needs Assistance Bed Mobility: Rolling;Sidelying to Sit Rolling: Supervision;Modified independent (Device/Increase time) Sidelying to sit: Supervision;Modified independent (Device/Increase time)       General bed mobility comments: uses RLE to assist LLE  Transfers Overall transfer level: Needs assistance Equipment used: Rolling walker (2 wheeled) Transfers: Sit to/from Stand Sit to Stand: Min guard;Supervision         General transfer comment: cues for hand placement and overall safety; pt tends to let go of walker, but not LOB  Ambulation/Gait Ambulation/Gait assistance: Min guard Ambulation Distance (Feet): 110 Feet Assistive device: Rolling walker (2 wheeled) Gait Pattern/deviations: Step-to pattern;Decreased weight shift to left;Decreased step length - left;Decreased step length - right;Trunk flexed Gait velocity: pt continues to have significant burning in thigh during amb   General Gait Details: cues for RW position and posture, sequence and safety with turns   Stairs            Wheelchair Mobility    Modified Rankin (Stroke Patients Only)       Balance             Standing balance-Leahy Scale: Fair                      Cognition Arousal/Alertness: Awake/alert Behavior During Therapy: WFL for tasks  assessed/performed Overall Cognitive Status: Within Functional Limits for tasks assessed                      Exercises  LAQs x10, ankle pumps x10 Reviewed HEP--pt reports doing APs, HS and quad sets on her own without difficulty    General Comments        Pertinent Vitals/Pain Pain Assessment: 0-10 Pain Score: 4  Pain Location: L hip Pain Descriptors / Indicators: Burning;Sore Pain Intervention(s): Limited activity within patient's tolerance;Monitored during session;RN gave pain meds during session;Ice applied    Home Living                      Prior Function            PT Goals (current goals can now be found in the care plan section) Acute Rehab PT Goals Patient Stated Goal: to get better PT Goal Formulation: With patient Time For Goal Achievement: 05/24/16 Potential to Achieve Goals: Good Progress towards PT goals: Progressing toward goals    Frequency    7X/week      PT Plan Current plan remains appropriate    Co-evaluation             End of Session Equipment Utilized During Treatment: Gait belt Activity Tolerance: Patient tolerated treatment well Patient left: with call bell/phone within reach;in chair     Time: 1204-1204 PT Time Calculation (min) (ACUTE ONLY): 0 min  Charges:  $Gait Training: 8-22 mins  G CodesKenyon Ana 05/19/2016, 1:35 PM

## 2016-05-19 NOTE — Progress Notes (Signed)
CSW following to assist with d/c planning. PASRR is closed today for Holiday. Pt can not dc to SNF without PASRR #. CSW will see pt today to facilitate dc to SNF on Tuesday.  Werner Lean LCSW [phone]

## 2016-05-19 NOTE — Progress Notes (Deleted)
CSW assisting with d/c planning. Pt needs PASRR # prior to d/c to SNF. PASRR is closed for Holiday and will be back open tomorrow. CSW will see pt this am to facilitate d/c to SNF on Tuesday.  Werner Lean LCSW [phone]

## 2016-05-19 NOTE — Clinical Social Work Note (Signed)
Clinical Social Work Assessment  Patient Details  Name: Deanna Gay MRN: [id number] Date of Birth: 02/24/1956  Date of referral:  05/19/16               Reason for consult:  Facility Placement, Discharge Planning                Permission sought to share information with:  Case Manager Permission granted to share information::  Yes, Verbal Permission Granted  Name::        Agency::     Relationship::     Contact Information:     Housing/Transportation Living arrangements for the past 2 months:  Single Family Home Source of Information:  Patient Patient Interpreter Needed:  None Criminal Activity/Legal Involvement Pertinent to Current Situation/Hospitalization:  No - Comment as needed Significant Relationships:  Parents Lives with:  Self Do you feel safe going back to the place where you live?  No (SNF recommended.) Need for family participation in patient care:  No (Coment)  Care giving concerns: Pt's care cannot be managed at home following hospital d/c.    Social Worker assessment / plan:  Pt hospitalized on 05/16/16 for pre planned left total hip arthroplasty. PT has recommended SNF at d/c. CSW met with pt at bedside to assist with d/c planning. Pt is in agreement with plan for [facility] at d/c and has requested [facility]. SNF search initiated. [facility] has made a bed offer and is able to admit pt when stable for d/c. Expecting to receive PASRR # tomorrow and will assist with d/c to SNF on 05/20/16.  Employment status:  Disabled (Comment on whether or not currently receiving Disability) Insurance information:  Medicare PT Recommendations:  [place] / Referral to community resources:  [place]  Patient/Family's Response to care:  Pt agrees with plan for [employer].  Patient/Family's Understanding of and Emotional Response to Diagnosis, Current Treatment, and Prognosis: Pt is aware of her medical status and is looking forward to  having rehab at [facility]. Pt appreciates CSW assistance with d/c planning.  Emotional Assessment Appearance:  Appears stated age Attitude/Demeanor/Rapport:  Other (cooperative) Affect (typically observed):  Pleasant, Appropriate, Calm Orientation:  Oriented to Self, Oriented to Place, Oriented to  Time, Oriented to Situation Alcohol / Substance use:  Not Applicable Psych involvement (Current and /or in the community):  No (Comment)  Discharge Needs  Concerns to be addressed:    Readmission within the last 30 days:  No Current discharge risk:  None Barriers to Discharge:  No Barriers Identified   Luretha Rued, [place] 05/19/2016, 2:51 PM

## 2016-05-19 NOTE — NC FL2 (Signed)
[place] LEVEL OF CARE SCREENING TOOL     IDENTIFICATION  Patient Name: Deanna Gay Birthdate: [birth year] Sex: female Admission Date (Current Location): 05/16/2016  [facility] and [place] Number:  Engineering geologist and Address:         Provider Number: [id number]  Attending Physician Name and Address:  Mcarthur Rossetti, *  Relative Name and Phone Number:       Current Level of Care: Hospital Recommended Level of Care: [place] Prior Approval Number:    Date Approved/Denied:   PASRR Number:    Discharge Plan: SNF    Current Diagnoses: Patient Active Problem List   Diagnosis Date Noted  . Unilateral primary osteoarthritis, left hip 05/16/2016  . Status post left hip replacement 05/16/2016  . Symptomatic anemia 02/27/2015    Orientation RESPIRATION BLADDER Height & Weight     Self, Time, Situation, Place  Normal Continent Weight: 199 lb (90.3 kg) Height:  5' 5"  (165.1 cm)  BEHAVIORAL SYMPTOMS/MOOD NEUROLOGICAL BOWEL NUTRITION STATUS  Other (Comment) (no behaviors)   Continent Diet  AMBULATORY STATUS COMMUNICATION OF NEEDS Skin   Limited Assist Verbally Surgical wounds                       Personal Care Assistance Level of Assistance  Bathing, Feeding, Dressing Bathing Assistance: Limited assistance Feeding assistance: Independent Dressing Assistance: Limited assistance     Functional Limitations Info  Sight, Hearing, Speech Sight Info: Adequate Hearing Info: Adequate Speech Info: Adequate    SPECIAL CARE FACTORS FREQUENCY  PT (By licensed PT), OT (By licensed OT)     PT Frequency: 5x wk OT Frequency: 5x wk            Contractures Contractures Info: Not present    Additional Factors Info  Code Status Code Status Info: Full Code             Current Medications (05/19/2016):  This is the current hospital active medication list Current Facility-Administered Medications  Medication Dose  Route Frequency Provider Last Rate Last Dose  . 0.9 %  sodium chloride infusion   Intravenous Continuous Mcarthur Rossetti, MD 75 mL/hr at 05/17/16 0654    . acetaminophen (TYLENOL) tablet 650 mg  650 mg Oral Q6H PRN Mcarthur Rossetti, MD       Or  . acetaminophen (TYLENOL) suppository 650 mg  650 mg Rectal Q6H PRN Mcarthur Rossetti, MD      . albuterol (PROVENTIL) (2.5 MG/3ML) 0.083% nebulizer solution 2.5 mg  2.5 mg Nebulization Q4H PRN Mcarthur Rossetti, MD      . alum & mag hydroxide-simeth (MAALOX/MYLANTA) 200-200-20 MG/5ML suspension 30 mL  30 mL Oral Q4H PRN Mcarthur Rossetti, MD      . aspirin chewable tablet 81 mg  81 mg Oral BID Mcarthur Rossetti, MD   81 mg at 05/18/16 2148  . diltiazem (CARDIZEM CD) 24 hr capsule 180 mg  180 mg Oral Daily Mcarthur Rossetti, MD   180 mg at 05/18/16 1053  . diphenhydrAMINE (BENADRYL) 12.5 MG/5ML elixir 12.5-25 mg  12.5-25 mg Oral Q4H PRN Mcarthur Rossetti, MD      . docusate sodium (COLACE) capsule 100 mg  100 mg Oral BID Mcarthur Rossetti, MD   100 mg at 05/18/16 2147  . enalapril (VASOTEC) tablet 20 mg  20 mg Oral BID Mcarthur Rossetti, MD   20 mg at 05/18/16 2148  .  gabapentin (NEURONTIN) capsule 100 mg  100 mg Oral TID Mcarthur Rossetti, MD   100 mg at 05/18/16 2146  . hydrochlorothiazide (HYDRODIURIL) tablet 25 mg  25 mg Oral Daily Mcarthur Rossetti, MD   25 mg at 05/18/16 1053  . HYDROmorphone (DILAUDID) injection 1 mg  1 mg Intravenous Q2H PRN Mcarthur Rossetti, MD   1 mg at 05/17/16 1458  . insulin aspart (novoLOG) injection 0-15 Units  0-15 Units Subcutaneous TID WC Mcarthur Rossetti, MD      . insulin glargine (LANTUS) injection 15 Units  15 Units Subcutaneous QHS Mcarthur Rossetti, MD   15 Units at 05/18/16 2149  . ipratropium-albuterol (DUONEB) 0.5-2.5 (3) MG/3ML nebulizer solution 3 mL  3 mL Nebulization Q6H PRN Mcarthur Rossetti, MD      . iron polysaccharides  (NIFEREX) capsule 150 mg  150 mg Oral BID Mcarthur Rossetti, MD   150 mg at 05/18/16 2148  . menthol-cetylpyridinium (CEPACOL) lozenge 3 mg  1 lozenge Oral PRN Mcarthur Rossetti, MD       Or  . phenol (CHLORASEPTIC) mouth spray 1 spray  1 spray Mouth/Throat PRN Mcarthur Rossetti, MD      . metFORMIN (GLUCOPHAGE) tablet 1,000 mg  1,000 mg Oral BID WC Mcarthur Rossetti, MD   1,000 mg at 05/18/16 1810  . methocarbamol (ROBAXIN) tablet 500 mg  500 mg Oral Q6H PRN Mcarthur Rossetti, MD   500 mg at 05/19/16 0020   Or  . methocarbamol (ROBAXIN) 500 mg in dextrose 5 % 50 mL IVPB  500 mg Intravenous Q6H PRN Mcarthur Rossetti, MD   500 mg at 05/16/16 1445  . metoCLOPramide (REGLAN) tablet 5-10 mg  5-10 mg Oral Q8H PRN Mcarthur Rossetti, MD       Or  . metoCLOPramide (REGLAN) injection 5-10 mg  5-10 mg Intravenous Q8H PRN Mcarthur Rossetti, MD      . mometasone-formoterol [facility]) 200-5 MCG/ACT inhaler 2 puff  2 puff Inhalation BID Mcarthur Rossetti, MD   2 puff at 05/17/16 2023  . nicotine (NICODERM CQ - dosed in mg/24 hours) patch 14 mg  14 mg Transdermal Daily Mcarthur Rossetti, MD   14 mg at 05/18/16 1000  . ondansetron (ZOFRAN) tablet 4 mg  4 mg Oral Q6H PRN Mcarthur Rossetti, MD       Or  . ondansetron [facility]) injection 4 mg  4 mg Intravenous Q6H PRN Mcarthur Rossetti, MD   4 mg at 05/16/16 1732  . oxybutynin (DITROPAN-XL) 24 hr tablet 10 mg  10 mg Oral QHS Mcarthur Rossetti, MD   10 mg at 05/18/16 2148  . oxyCODONE (Oxy IR/ROXICODONE) immediate release tablet 5-10 mg  5-10 mg Oral Q3H PRN Mcarthur Rossetti, MD   10 mg at 05/19/16 0259  . pantoprazole (PROTONIX) EC tablet 40 mg  40 mg Oral Daily Mcarthur Rossetti, MD   40 mg at 05/18/16 1053  . polyethylene glycol (MIRALAX / GLYCOLAX) packet 17 g  17 g Oral Daily PRN Mcarthur Rossetti, MD      . zolpidem [facility]) tablet 5 mg  5 mg Oral QHS PRN Mcarthur Rossetti, MD          Discharge Medications: Please see discharge summary for a list of discharge medications.  Relevant Imaging Results:  Relevant Lab Results:   Additional Information ss # [id number]  Aizik Reh, [place] An, LCSW

## 2016-05-20 LAB — GLUCOSE, CAPILLARY: Glucose-Capillary: 126 mg/dL — ABNORMAL HIGH (ref 65–99)

## 2016-05-20 MED ORDER — GABAPENTIN 100 MG PO CAPS: 100.0000 mg | ORAL_CAPSULE | Freq: Three times a day (TID) | ORAL | 0 refills | Status: DC

## 2016-05-20 NOTE — Progress Notes (Signed)
[facility] to give report and was disconnected, and then placed on hold for 5 minutes. They did not receive report. However patient was given discharge papers and papers to take to facility with a number to call if they had any questions. Thanks Priscille Heidelberg

## 2016-05-20 NOTE — Progress Notes (Signed)
Patient ID: Deanna Gay, female   DOB: June 14, 1955, 61 y.o.   MRN: [id number] Can be discharged to skilled nursing today.

## 2016-05-20 NOTE — Op Note (Signed)
Deanna Gay, Deanna Gay            ACCOUNT NO.:  [id number]  MEDICAL RECORD NO.:  [id number]  LOCATION:                                 FACILITY:  PHYSICIAN:  Lind Guest. Ninfa Linden, M.D.DATE OF BIRTH:  [birth year]  DATE OF PROCEDURE:  05/16/2016 DATE OF DISCHARGE:                              OPERATIVE REPORT   PREOPERATIVE DIAGNOSIS:  Primary osteoarthritis and degenerative joint disease of left hip.  POSTOPERATIVE DIAGNOSIS:  Primary osteoarthritis and degenerative joint disease of left hip.  PROCEDURE:  Left total hip arthroplasty through direct anterior approach.  IMPLANTS:  DePuy Sector Gription acetabular component size 52, size 36+ 0 polyethylene liner, size 9 Corail femoral component with standard offset, size 36+ 8.5 ceramic hip ball.  SURGEON:  Lind Guest. Ninfa Linden, M.D.  ASSISTANT:  Erskine Emery, PA-C.  ANESTHESIA:  Spinal.  ANTIBIOTICS:  900 mg of IV clindamycin.  BLOOD LOSS:  100 mL.  COMPLICATIONS:  None.  INDICATIONS:  Deanna Gay is a very pleasant 61 year old female with debilitating arthritis involving her left hip.  She actually had a right total hip arthroplasty done through the posterior approach in [place] in 2013, and she is satisfied with that hip from a pain standpoint, which she significantly longer on that side.  She walks with a significant limp.  She does have severe osteoarthritis and degenerative joint disease in her left hip.  At this point, she does wish to proceed with a total hip replacement on the left side.  I have talked to her about direct anterior hip surgery and the fact that under direct fluoroscopy as well, we could hopefully get her back out to the same length.  I talked her about the risk of acute blood loss anemia, nerve and vessel injury, fracture, infection, dislocation, DVT.  She understands our goals are decreased pain, improved mobility and overall improved quality of life.  PROCEDURE DESCRIPTION:  After  informed consent was obtained, appropriate left hip was marked.  She was brought to the operating room where spinal anesthesia was obtained while she was on her stretcher.  She was then laid in a supine position on the stretcher and traction boots were placed on both of her feet.  Next, she was placed supine on the Hana fracture table with the perineal post in place and both legs in inline skeletal traction devices, but no traction applied.  Her left operative hip was prepped and draped with DuraPrep and sterile drapes.  A time-out was called and she was identified as correct patient and correct left hip.  I then made an incision just inferior and posterior to the anterior superior iliac spine and carried this obliquely down the leg. We dissected down the tensor fascia lata muscle and the tensor fascia was then divided longitudinally so I could proceed with direct anterior approach to the hip.  I identified and cauterized the circumflex vessels, then identified the hip capsule.  I opened up the hip capsule in an L-type format finding a moderate joint effusion.  We did find significant periarticular osteophytes.  We placed Cobra retractors around the medial and lateral femoral neck and then made our femoral neck cut proximal to the lesser trochanter with an  oscillating saw and completed this with an osteotome.  I placed a corkscrew guide in the femoral head and removed the femoral head in its entirety and found it to be completely devoid of cartilage.  We then placed a bent Hohmann over the medial acetabular rim and removed remnants of acetabular labrum and other debris.  We then began reaming under direct visualization from a size 43 reamer up to a size 52 with all reamers under direct visualization and the last 2 reamers under direct fluoroscopy, so we could obtain our depth of reaming, our inclination and anteversion. Once we were pleased with this, we placed the real DePuy Sector  Gription acetabular component size 52 and a 36+ 0 polyethylene liner for that size acetabular component.  Attention was then turned to the femur. With the leg externally rotated to 120 degrees, extended and adducted, we were able to place a Mueller retractor medially and a Hohmann retractor behind the greater trochanter.  We released the lateral joint capsule and used a box cutting osteotome to enter the femoral canal and a rongeur to lateralize.  We then began broaching from a size 8 broach using the Corail broaching system only up to a size 9 because of the tight fit of the canal.  We trialed a standard offset femoral neck and a 36+ 1.5 hip ball.  We brought the leg back over and up with traction and internal rotation reducing the pelvis.  We were pleased with stability, but we knew that we needed more leg length and offset to match her other side and that was her biggest area of __________ was her leg length discrepancy.  We dislocated the hip and removed the trial components. We were then able to place the real Corail femoral component with standard offset size 9 and the real 36+ 8.5 ceramic hip ball, we reduced this in the acetabulum and we were pleased with stability, leg length, offset and range of motion.  We then irrigated the soft tissue with normal saline solution using pulsatile lavage.  We were able to close the joint capsule with interrupted #1 Ethibond suture followed by running #1 Vicryl in the tensor fascia, 0 Vicryl in the deep tissue, 2-0 Vicryl in the subcutaneous tissue, 4-0 Monocryl subcuticular stitch and Steri-Strips on the skin.  An Aquacel dressing was applied, and she was taken off the fracture table and taken to the recovery room in stable condition.  All final counts were correct.  There were no complications noted.  Of note, Erskine Emery, PA-C, assisted during the entire case. His assistance was crucial for facilitating all aspects of this  case.     Lind Guest. Ninfa Linden, M.D.   ______________________________ Lind Guest. Ninfa Linden, M.D.    CYB/MEDQ  D:  05/16/2016  T:  05/17/2016  Job:  [id number]

## 2016-05-20 NOTE — Clinical Social Work Placement (Signed)
   CLINICAL SOCIAL WORK PLACEMENT  NOTE  Date:  05/20/2016  Patient Details  Name: TAL NEER MRN: [id number] Date of Birth: 08/25/1955  Clinical Social Work is seeking post-discharge placement for this patient at the [place] level of care (*CSW will initial, date and re-position this form in  chart as items are completed):  Yes   Patient/family provided with [place] Work Department's list of facilities offering this level of care within the geographic area requested by the patient (or if unable, by the patient's family).  Yes   Patient/family informed of their freedom to choose among providers that offer the needed level of care, that participate in Medicare, Medicaid or managed care program needed by the patient, have an available bed and are willing to accept the patient.  Yes   Patient/family informed of [facility]'s ownership interest in [facility] and [facility], as well as of the fact that they are under no obligation to receive care at these facilities.  PASRR submitted to EDS on 05/19/16     PASRR number received on       Existing PASRR number confirmed on       FL2 transmitted to all facilities in geographic area requested by pt/family on 05/19/16     FL2 transmitted to all facilities within larger geographic area on       Patient informed that his/her managed care company has contracts with or will negotiate with certain facilities, including the following:        Yes   Patient/family informed of bed offers received.  Patient chooses bed at [facility]     Physician recommends and patient chooses bed at      Patient to be transferred to [facility] on 05/20/16.  Patient to be transferred to facility by [place]     Patient family notified on 05/20/16 of transfer.  Name of family member notified:  Pt contacted family directly.     PHYSICIAN       Additional Comment: Pt is in agreement with d/c  to [facility] today. Pt has requested to transfer by car. D/C Summary sent to SNF for review. Scripts included in d/c packet. # for report provided to nsg. D/C packet provided to pt. Pt is aware smoking is not allowed at facility and has agreed to comply with this rule.   _______________________________________________ Luretha Rued, [place] 05/20/2016, 10:31 AM

## 2016-05-20 NOTE — Discharge Summary (Signed)
Patient ID: WYONIA FONTANELLA MRN: [id number] DOB/AGE: [birth year] 61 y.o.  Admit date: 05/16/2016 Discharge date: 05/20/2016  Admission Diagnoses:  Principal Problem:   Unilateral primary osteoarthritis, left hip Active Problems:   Status post left hip replacement   Discharge Diagnoses:  Same  Past Medical History:  Diagnosis Date  . Arthritis   . Cancer ([place])    cervical  . COPD (chronic obstructive pulmonary disease) ([place])   . Depression   . Diabetes mellitus without complication ([place])   . GERD (gastroesophageal reflux disease)   . Hypertension   . Schizophrenia ([place])   . Sleep apnea     Surgeries: Procedure(s): LEFT TOTAL HIP ARTHROPLASTY ANTERIOR APPROACH on 05/16/2016   Consultants:   Discharged Condition: Improved  Hospital Course: KIRI HINDERLITER is an 61 y.o. female who was admitted 05/16/2016 for operative treatment ofUnilateral primary osteoarthritis, left hip. Patient has severe unremitting pain that affects sleep, daily activities, and work/hobbies. After pre-op clearance the patient was taken to the operating room on 05/16/2016 and underwent  Procedure(s): LEFT TOTAL HIP ARTHROPLASTY ANTERIOR APPROACH.    Patient was given perioperative antibiotics: Anti-infectives    Start     Dose/Rate Route Frequency Ordered Stop   05/16/16 1800  clindamycin (CLEOCIN) IVPB 600 mg     600 mg 100 mL/hr over 30 Minutes Intravenous Every 6 hours 05/16/16 1626 05/17/16 0015   05/16/16 0849  clindamycin (CLEOCIN) IVPB 900 mg     900 mg 100 mL/hr over 30 Minutes Intravenous On call to O.R. 05/16/16 0849 05/16/16 1213       Patient was given sequential compression devices, early ambulation, and chemoprophylaxis to prevent DVT.  Patient benefited maximally from hospital stay and there were no complications.    Recent vital signs: Patient Vitals for the past 24 hrs:  BP Temp Temp src Pulse Resp SpO2  05/20/16 0521 130/70 98.2 F (36.8 C) Oral 91 18 92 %  05/19/16  2145 138/66 98.3 F (36.8 C) Oral 81 18 95 %  05/19/16 2056 - - - - - 94 %  05/19/16 1400 112/62 98.1 F (36.7 C) Oral 72 18 97 %  05/19/16 0853 - - - - - 92 %     Recent laboratory studies: No results for input(s): WBC, HGB, HCT, PLT, NA, K, CL, CO2, BUN, CREATININE, GLUCOSE, INR, CALCIUM in the last 72 hours.  Invalid input(s): PT, 2   Discharge Medications:   Allergies as of 05/20/2016      Reactions   Iodine Swelling   Penicillins Swelling   Has patient had a PCN reaction causing immediate rash, facial/tongue/throat swelling, SOB or lightheadedness with hypotension: Yes Has patient had a PCN reaction causing severe rash involving mucus membranes or skin necrosis: Yes Has patient had a PCN reaction that required hospitalization: No Has patient had a PCN reaction occurring within the last 10 years: No If all of the above answers are "NO", then may proceed with Cephalosporin use.      Medication List    TAKE these medications   albuterol 108 (90 Base) MCG/ACT inhaler Commonly known as:  PROVENTIL HFA;VENTOLIN HFA Inhale 2 puffs into the lungs every 4 (four) hours as needed for wheezing or shortness of breath.   aspirin 81 MG chewable tablet Chew 1 tablet (81 mg total) by mouth 2 (two) times daily.   benzonatate 100 MG capsule Commonly known as:  TESSALON Take 100 mg by mouth daily.   budesonide-formoterol 160-4.5 MCG/ACT inhaler Commonly known as:  SYMBICORT Inhale 2 puffs into the lungs 2 (two) times daily.   buPROPion 75 MG tablet Commonly known as:  WELLBUTRIN Take 75 mg by mouth 2 (two) times daily.   cyclobenzaprine 10 MG tablet Commonly known as:  FLEXERIL Take 1 tablet (10 mg total) by mouth 3 (three) times daily as needed for muscle spasms. What changed:  when to take this   dicyclomine 10 MG capsule Commonly known as:  BENTYL Take 10 mg by mouth 3 (three) times daily as needed for spasms.   diltiazem 180 MG 24 hr capsule Commonly known as:  CARDIZEM  CD Take 180 mg by mouth daily.   enalapril 20 MG tablet Commonly known as:  VASOTEC take 1 tablet by mouth twice a day   gabapentin 100 MG capsule Commonly known as:  NEURONTIN Take 1 capsule (100 mg total) by mouth 3 (three) times daily.   hydrochlorothiazide 25 MG tablet Commonly known as:  HYDRODIURIL Take 25 mg by mouth daily.   insulin glargine 100 UNIT/ML injection Commonly known as:  LANTUS Inject 15 Units into the skin at bedtime.   ipratropium-albuterol 0.5-2.5 (3) MG/3ML Soln Commonly known as:  DUONEB Take 3 mLs by nebulization every 6 (six) hours as needed.   iron polysaccharides 150 MG capsule Commonly known as:  NIFEREX Take 150 mg by mouth 2 (two) times daily.   metFORMIN 1000 MG tablet Commonly known as:  GLUCOPHAGE Take 1,000 mg by mouth 2 (two) times daily with a meal.   omeprazole 20 MG capsule Commonly known as:  PRILOSEC Take 20 mg by mouth 2 (two) times daily before a meal.   oxybutynin 10 MG 24 hr tablet Commonly known as:  DITROPAN-XL Take 10 mg by mouth at bedtime.   oxyCODONE-acetaminophen 7.5-325 MG tablet Commonly known as:  PERCOCET Take 1-2 tablets by mouth every 6 (six) hours as needed for severe pain. What changed:  how much to take   triamcinolone 0.1 % paste Commonly known as:  KENALOG Use as directed 1 application in the mouth or throat daily.   triamcinolone cream 0.1 % Commonly known as:  KENALOG Apply 1 application topically 2 (two) times daily.            Durable Medical Equipment        Start     Ordered   05/16/16 1627  DME Walker rolling  Once    Question:  Patient needs a walker to treat with the following condition  Answer:  Status post left hip replacement   05/16/16 1626   05/16/16 1627  DME 3 n 1  Once     05/16/16 1626      Diagnostic Studies: Dg Pelvis Portable  Result Date: 05/16/2016 CLINICAL DATA:  Status post left hip arthroplasty. EXAM: PORTABLE PELVIS 1-2 VIEWS COMPARISON:  04/08/2016  FINDINGS: New left hip total arthroplasty appears well seated and aligned on the single AP view. Postoperative soft tissue air overlies the proximal femur and lies lateral to the left hip joint. There is no acute fracture or evidence of an operative complication. No change in the appearance of the right hip total arthroplasty. IMPRESSION: Well-positioned total left hip arthroplasty. Electronically Signed   By: Lajean Manes M.D.   On: 05/16/2016 14:09   Dg C-arm 61-120 Min-no Report  Result Date: 05/16/2016 There is no Radiologist interpretation  for this exam.  Dg Hip Operative Unilat With Pelvis Left  Result Date: 05/16/2016 CLINICAL DATA:  Left hip replacement EXAM: DG C-ARM  61-120 MIN-NO REPORT; OPERATIVE LEFT HIP WITH PELVIS COMPARISON:  None. FINDINGS: Changes of left hip replacement. Normal AP alignment. No hardware or bony complicating feature. Remote changes of right hip replacement. IMPRESSION: Left hip replacement.  No complicating feature. Electronically Signed   By: Rolm Baptise M.D.   On: 05/16/2016 13:30    Disposition: to skilled nursing facility  Discharge Instructions    Call MD / Call 911    Complete by:  As directed    If you experience chest pain or shortness of breath, CALL 911 and be transported to the hospital emergency room.  If you develope a fever above 101 F, pus (white drainage) or increased drainage or redness at the wound, or calf pain, call your surgeon's office.   Constipation Prevention    Complete by:  As directed    Drink plenty of fluids.  Prune juice may be helpful.  You may use a stool softener, such as Colace (over the counter) 100 mg twice a day.  Use MiraLax (over the counter) for constipation as needed.   Diet - low sodium heart healthy    Complete by:  As directed    Discharge patient    Complete by:  As directed    Increase activity slowly as tolerated    Complete by:  As directed        Contact information for follow-up providers     Mcarthur Rossetti, MD Follow up in 2 day(s).   Specialty:  Orthopedic Surgery Contact information: [address] [id number] [phone]            Contact information for after-discharge care    [facility] .   Specialty:  Skilled Nursing Facility Contact information: [address] [place] [phone]                   Signed: Mcarthur Rossetti 05/20/2016, 7:00 AM

## 2016-05-29 ENCOUNTER — Ambulatory Visit (INDEPENDENT_AMBULATORY_CARE_PROVIDER_SITE_OTHER): Payer: Medicare Other | Admitting: Orthopaedic Surgery

## 2016-05-29 DIAGNOSIS — Z96642 Presence of left artificial hip joint: Secondary | ICD-10-CM

## 2016-05-29 NOTE — Progress Notes (Signed)
The patient is 2 weeks status post a left total hip arthroplasty. She's been staying at a nursing care facility. She says she is doing well.  On examination of her left hip her incision looks good. I removed the stitches and placed new stitches. She did have a moderate seroma and I drained almost 60 mL of fluid off of her hip. There is notes infection. Her leg lengths are equal. She's been on aspirin twice a day. She is very pleased with her progress. She is and live with a walker.  At this point she will also stay until next week at the nursing facility set this up to her and facilities far as transition her to home with home health therapy. I'll see her back myself in 4 weeks to see how she doing overall. No x-rays are needed.

## 2016-06-03 ENCOUNTER — Telehealth (INDEPENDENT_AMBULATORY_CARE_PROVIDER_SITE_OTHER): Payer: Self-pay | Admitting: Orthopaedic Surgery

## 2016-06-03 NOTE — Telephone Encounter (Signed)
Patient aware that if it gets to bothering her we can drain it off

## 2016-06-03 NOTE — Telephone Encounter (Signed)
Pt said she has fluid built up around the incision from surgery she had on her left hip. She said its not hurting but feels tight. Pt wants to know what she needs to do or if she needs to come in please let her know.  Please call [phone]

## 2016-08-18 DIAGNOSIS — Z0000 Encounter for general adult medical examination without abnormal findings: Secondary | ICD-10-CM | POA: Insufficient documentation

## 2016-09-10 ENCOUNTER — Other Ambulatory Visit: Payer: Self-pay

## 2016-09-10 ENCOUNTER — Inpatient Hospital Stay: Payer: Medicare Other | Attending: Oncology

## 2016-09-10 DIAGNOSIS — I10 Essential (primary) hypertension: Secondary | ICD-10-CM | POA: Insufficient documentation

## 2016-09-10 DIAGNOSIS — Z96642 Presence of left artificial hip joint: Secondary | ICD-10-CM | POA: Insufficient documentation

## 2016-09-10 DIAGNOSIS — F329 Major depressive disorder, single episode, unspecified: Secondary | ICD-10-CM | POA: Diagnosis not present

## 2016-09-10 DIAGNOSIS — Z79899 Other long term (current) drug therapy: Secondary | ICD-10-CM | POA: Diagnosis not present

## 2016-09-10 DIAGNOSIS — D509 Iron deficiency anemia, unspecified: Secondary | ICD-10-CM | POA: Diagnosis not present

## 2016-09-10 DIAGNOSIS — D649 Anemia, unspecified: Secondary | ICD-10-CM

## 2016-09-10 DIAGNOSIS — G4730 Sleep apnea, unspecified: Secondary | ICD-10-CM | POA: Insufficient documentation

## 2016-09-10 DIAGNOSIS — K219 Gastro-esophageal reflux disease without esophagitis: Secondary | ICD-10-CM | POA: Diagnosis not present

## 2016-09-10 DIAGNOSIS — Z794 Long term (current) use of insulin: Secondary | ICD-10-CM | POA: Diagnosis not present

## 2016-09-10 DIAGNOSIS — F17210 Nicotine dependence, cigarettes, uncomplicated: Secondary | ICD-10-CM | POA: Insufficient documentation

## 2016-09-10 DIAGNOSIS — J449 Chronic obstructive pulmonary disease, unspecified: Secondary | ICD-10-CM | POA: Insufficient documentation

## 2016-09-10 DIAGNOSIS — E1165 Type 2 diabetes mellitus with hyperglycemia: Secondary | ICD-10-CM | POA: Insufficient documentation

## 2016-09-10 DIAGNOSIS — F209 Schizophrenia, unspecified: Secondary | ICD-10-CM | POA: Insufficient documentation

## 2016-09-10 DIAGNOSIS — Z7982 Long term (current) use of aspirin: Secondary | ICD-10-CM | POA: Diagnosis not present

## 2016-09-10 LAB — BASIC METABOLIC PANEL
Anion gap: 9 (ref 5–15)
BUN: 11 mg/dL (ref 6–20)
CHLORIDE: 100 mmol/L — AB (ref 101–111)
CO2: 26 mmol/L (ref 22–32)
CREATININE: 0.69 mg/dL (ref 0.44–1.00)
Calcium: 8.6 mg/dL — ABNORMAL LOW (ref 8.9–10.3)
GFR calc Af Amer: >60 mL/min (ref >60)
GFR calc non Af Amer: >60 mL/min (ref >60)
GLUCOSE: 116 mg/dL — AB (ref 65–99)
POTASSIUM: 3.7 mmol/L (ref 3.5–5.1)
SODIUM: 135 mmol/L (ref 135–145)

## 2016-09-10 LAB — CBC WITH DIFFERENTIAL/PLATELET
BASOS ABS: 0.1 10*3/uL (ref 0–0.1)
Basophils Relative: 1 %
EOS ABS: 0.1 10*3/uL (ref 0–0.7)
EOS PCT: 1 %
HCT: 38.5 % (ref 35.0–47.0)
Hemoglobin: 12.4 g/dL (ref 12.0–16.0)
Lymphocytes Relative: 32 %
Lymphs Abs: 2 10*3/uL (ref 1.0–3.6)
MCH: 26 pg (ref 26.0–34.0)
MCHC: 32.3 g/dL (ref 32.0–36.0)
MCV: 80.5 fL (ref 80.0–100.0)
MONO ABS: 0.4 10*3/uL (ref 0.2–0.9)
Monocytes Relative: 7 %
Neutro Abs: 3.7 10*3/uL (ref 1.4–6.5)
Neutrophils Relative %: 59 %
PLATELETS: 248 10*3/uL (ref 150–440)
RBC: 4.78 MIL/uL (ref 3.80–5.20)
RDW: 16 % — AB (ref 11.5–14.5)
WBC: 6.2 10*3/uL (ref 3.6–11.0)

## 2016-09-10 LAB — IRON AND TIBC
Iron: 32 ug/dL (ref 28–170)
SATURATION RATIOS: 7 % — AB (ref 10.4–31.8)
TIBC: 474 ug/dL — ABNORMAL HIGH (ref 250–450)
UIBC: 442 ug/dL

## 2016-09-10 LAB — FERRITIN: Ferritin: 8 ng/mL — ABNORMAL LOW (ref 11–307)

## 2016-09-10 NOTE — Progress Notes (Signed)
[facility]  [phone] [phone]  ID: Deanna Gay OB: [birth year]  MR#: [id number]  AOZ#:[id number]  Patient Care Team: Kirk Ruths, MD as PCP - General (Internal Medicine)  CHIEF COMPLAINT: Iron deficiency anemia.  INTERVAL HISTORY: Patient returns to clinic today for further evaluation and laboratory work. She is having black tarry stools. She has increased weakness and fatigue. She has no neurologic complaints. She denies any recent fevers. She has no chest pain or shortness of breath. She denies any nausea, vomiting, constipation, or diarrhea. She does complain of urinary frequency. Patient offers no further specific complaints today.  REVIEW OF SYSTEMS:   Review of Systems  Constitutional: Positive for malaise/fatigue. Negative for weight loss.  Respiratory: Negative.  Negative for cough and shortness of breath.   Cardiovascular: Negative.  Negative for chest pain and leg swelling.  Gastrointestinal: Positive for melena. Negative for abdominal pain and blood in stool.  Genitourinary: Positive for frequency.  Musculoskeletal: Negative.   Skin: Negative.  Negative for rash.  Neurological: Positive for weakness.  Psychiatric/Behavioral: The patient is not nervous/anxious.     As per HPI. Otherwise, a complete review of systems is negative.  PAST MEDICAL HISTORY: Past Medical History:  Diagnosis Date  . Arthritis   . Cancer ([place])    cervical  . COPD (chronic obstructive pulmonary disease) ([place])   . Depression   . Diabetes mellitus without complication ([place])   . GERD (gastroesophageal reflux disease)   . Hypertension   . Schizophrenia ([place])   . Sleep apnea     PAST SURGICAL HISTORY: Past Surgical History:  Procedure Laterality Date  . APPENDECTOMY    . COLONOSCOPY WITH PROPOFOL N/A 12/25/2014   Procedure: COLONOSCOPY WITH PROPOFOL;  Surgeon: Josefine Class, MD;  [facility] ENDOSCOPY;  Service: Endoscopy;   Laterality: N/A;  . ESOPHAGOGASTRODUODENOSCOPY (EGD) WITH PROPOFOL N/A 12/25/2014   Procedure: ESOPHAGOGASTRODUODENOSCOPY (EGD) WITH PROPOFOL;  Surgeon: Josefine Class, MD;  Location: [facility] ENDOSCOPY;  Service: Endoscopy;  Laterality: N/A;  . JOINT REPLACEMENT     right hip  . TOTAL HIP ARTHROPLASTY Left 05/16/2016   Procedure: LEFT TOTAL HIP ARTHROPLASTY ANTERIOR APPROACH;  Surgeon: Mcarthur Rossetti, MD;  Location: [facility] ORS;  Service: Orthopedics;  Laterality: Left;    FAMILY HISTORY Family History  Problem Relation Age of Onset  . Hypertension Mother   . Diabetes Mellitus II Mother   . Breast cancer Neg Hx        ADVANCED DIRECTIVES:    HEALTH MAINTENANCE: Social History  Substance Use Topics  . Smoking status: Current Every Day Smoker    Packs/day: 1.00  . Smokeless tobacco: Never Used  . Alcohol use No     Colonoscopy:  PAP:  Bone density:  Lipid panel:  Allergies  Allergen Reactions  . Iodine Swelling  . Penicillins Swelling    Has patient had a PCN reaction causing immediate rash, facial/tongue/throat swelling, SOB or lightheadedness with hypotension: Yes Has patient had a PCN reaction causing severe rash involving mucus membranes or skin necrosis: Yes Has patient had a PCN reaction that required hospitalization: No Has patient had a PCN reaction occurring within the last 10 years: No If all of the above answers are "NO", then may proceed with Cephalosporin use.     Current Outpatient Prescriptions  Medication Sig Dispense Refill  . albuterol (PROVENTIL HFA;VENTOLIN HFA) 108 (90 BASE) MCG/ACT inhaler Inhale 2 puffs into the lungs every 4 (four) hours as needed for wheezing  or shortness of breath.     Marland Kitchen aspirin 81 MG chewable tablet Chew 1 tablet (81 mg total) by mouth 2 (two) times daily. 60 tablet 0  . benzonatate (TESSALON) 100 MG capsule Take 100 mg by mouth daily.   0  . budesonide-formoterol (SYMBICORT) 160-4.5 MCG/ACT inhaler Inhale 2 puffs into  the lungs 2 (two) times daily.    . budesonide-formoterol (SYMBICORT) 160-4.5 MCG/ACT inhaler inhale 2 puffs by mouth twice a day    . buPROPion (WELLBUTRIN) 75 MG tablet Take 75 mg by mouth 2 (two) times daily.    . cyclobenzaprine (FLEXERIL) 10 MG tablet Take 1 tablet (10 mg total) by mouth 3 (three) times daily as needed for muscle spasms. 30 tablet 0  . dicyclomine (BENTYL) 10 MG capsule Take 10 mg by mouth 3 (three) times daily as needed for spasms.    Marland Kitchen diltiazem (CARDIZEM CD) 180 MG 24 hr capsule Take 180 mg by mouth daily.    . enalapril (VASOTEC) 20 MG tablet take 1 tablet by mouth twice a day    . gabapentin (NEURONTIN) 100 MG capsule Take 1 capsule (100 mg total) by mouth 3 (three) times daily. 60 capsule 0  . hydrochlorothiazide (HYDRODIURIL) 25 MG tablet Take 25 mg by mouth daily.    . insulin glargine (LANTUS) 100 UNIT/ML injection Inject 15 Units into the skin at bedtime.    Marland Kitchen ipratropium-albuterol (DUONEB) 0.5-2.5 (3) MG/3ML SOLN Take 3 mLs by nebulization every 6 (six) hours as needed.    . metFORMIN (GLUCOPHAGE) 500 MG tablet Take 500 mg by mouth 2 (two) times daily with a meal.     . omeprazole (PRILOSEC) 20 MG capsule Take 20 mg by mouth 2 (two) times daily before a meal.   0  . oxybutynin (DITROPAN-XL) 10 MG 24 hr tablet Take 10 mg by mouth at bedtime.    Marland Kitchen oxyCODONE-acetaminophen (PERCOCET) 7.5-325 MG tablet Take 1-2 tablets by mouth every 6 (six) hours as needed for severe pain. 30 tablet 0  . triamcinolone (KENALOG) 0.1 % paste Use as directed 1 application in the mouth or throat daily.  1  . triamcinolone cream (KENALOG) 0.1 % Apply 1 application topically 2 (two) times daily.    . IFEREX 150 150 MG capsule take 1 capsule by mouth twice a day 200 capsule 0   No current facility-administered medications for this visit.     OBJECTIVE: Vitals:   09/12/16 0926  BP: 127/82  Pulse: 77  Resp: 18  Temp: 97.5 F (36.4 C)     Body mass index is 33.27 kg/m.    ECOG FS:0 -  Asymptomatic  General: Well-developed, well-nourished, no acute distress. Eyes: Pink conjunctiva, anicteric sclera. Lungs: Clear to auscultation bilaterally. Heart: Regular rate and rhythm. No rubs, murmurs, or gallops. Abdomen: Soft, nontender, nondistended. No organomegaly noted, normoactive bowel sounds. Musculoskeletal: No edema, cyanosis, or clubbing. Neuro: Alert, answering all questions appropriately. Cranial nerves grossly intact. Skin: No rashes or petechiae noted. Psych: Normal affect.  LAB RESULTS:  Lab Results  Component Value Date   NA 135 09/10/2016   K 3.7 09/10/2016   CL 100 (L) 09/10/2016   CO2 26 09/10/2016   GLUCOSE 116 (H) 09/10/2016   BUN 11 09/10/2016   CREATININE 0.69 09/10/2016   CALCIUM 8.6 (L) 09/10/2016   PROT 6.3 (L) 02/26/2015   ALBUMIN 3.0 (L) 02/26/2015   AST 13 (L) 02/26/2015   ALT 10 (L) 02/26/2015   ALKPHOS 70 02/26/2015   BILITOT  0.3 02/26/2015   GFRNONAA >60 09/10/2016   GFRAA >60 09/10/2016    Lab Results  Component Value Date   WBC 6.2 09/10/2016   NEUTROABS 3.7 09/10/2016   HGB 12.4 09/10/2016   HCT 38.5 09/10/2016   MCV 80.5 09/10/2016   PLT 248 09/10/2016   Lab Results  Component Value Date   FERRITIN 8 (L) 09/10/2016   Lab Results  Component Value Date   IRON 32 09/10/2016   TIBC 474 (H) 09/10/2016   IRONPCTSAT 7 (L) 09/10/2016      STUDIES: No results found.  ASSESSMENT: Iron deficiency anemia.  PLAN:    1. Iron deficiency anemia: Although patient's hemoglobin is within normal limits, her iron stores are significantly decreased and she is symptomatic. Previously, the remainder of her blood work was either negative or within normal limits. She is also possibly losing blood given her report of black tarry stools.  Proceed with 510 mg IV Feraheme today. Return to clinic in 1 week for a second infusion and then in 3 months with repeat laboratory work and further evaluation.  2. Hyperglycemia: Continue insulin and  diabetic medications as prescribed. 3. Hypertension: Patient's blood pressure is mildly elevated today, continue current medications as prescribed. 4. Black tarry stools: A referral was sent to GI.  Patient expressed understanding and was in agreement with this plan. She also understands that She can call clinic at any time with any questions, concerns, or complaints.    Lloyd Huger, MD   09/13/2016 11:29 PM

## 2016-09-12 ENCOUNTER — Other Ambulatory Visit: Payer: Self-pay | Admitting: Oncology

## 2016-09-12 ENCOUNTER — Inpatient Hospital Stay: Payer: Medicare Other

## 2016-09-12 ENCOUNTER — Inpatient Hospital Stay (HOSPITAL_BASED_OUTPATIENT_CLINIC_OR_DEPARTMENT_OTHER): Payer: Medicare Other | Admitting: Oncology

## 2016-09-12 VITALS — BP 145/76 | HR 71 | Resp 18

## 2016-09-12 VITALS — BP 127/82 | HR 77 | Temp 97.5°F | Resp 18 | Wt 200.0 lb

## 2016-09-12 DIAGNOSIS — E1165 Type 2 diabetes mellitus with hyperglycemia: Secondary | ICD-10-CM

## 2016-09-12 DIAGNOSIS — F17210 Nicotine dependence, cigarettes, uncomplicated: Secondary | ICD-10-CM | POA: Diagnosis not present

## 2016-09-12 DIAGNOSIS — D509 Iron deficiency anemia, unspecified: Secondary | ICD-10-CM | POA: Diagnosis not present

## 2016-09-12 DIAGNOSIS — Z79899 Other long term (current) drug therapy: Secondary | ICD-10-CM | POA: Diagnosis not present

## 2016-09-12 DIAGNOSIS — I10 Essential (primary) hypertension: Secondary | ICD-10-CM | POA: Diagnosis not present

## 2016-09-12 MED ORDER — SODIUM CHLORIDE 0.9 % IV SOLN
510.0000 mg | Freq: Once | INTRAVENOUS | Status: AC
  Administered 2016-09-12: 510 mg via INTRAVENOUS
  Filled 2016-09-12: qty 17

## 2016-09-12 MED ORDER — SODIUM CHLORIDE 0.9 % IV SOLN
Freq: Once | INTRAVENOUS | Status: AC
  Administered 2016-09-12: 11:00:00 via INTRAVENOUS
  Filled 2016-09-12: qty 1000

## 2016-09-12 NOTE — Progress Notes (Signed)
Patient is here for follow up, she mentions her stools are black, this has been going on for a month. She also has a lot of dizzy spells.

## 2016-09-12 NOTE — Patient Instructions (Signed)

## 2016-09-16 ENCOUNTER — Inpatient Hospital Stay: Payer: Medicare Other | Attending: Internal Medicine

## 2016-09-16 DIAGNOSIS — D509 Iron deficiency anemia, unspecified: Secondary | ICD-10-CM | POA: Insufficient documentation

## 2016-09-16 DIAGNOSIS — Z79899 Other long term (current) drug therapy: Secondary | ICD-10-CM | POA: Diagnosis not present

## 2016-09-16 MED ORDER — SODIUM CHLORIDE 0.9 % IV SOLN
Freq: Once | INTRAVENOUS | Status: AC
  Administered 2016-09-16: 09:00:00 via INTRAVENOUS
  Filled 2016-09-16: qty 1000

## 2016-09-16 MED ORDER — SODIUM CHLORIDE 0.9 % IV SOLN
510.0000 mg | Freq: Once | INTRAVENOUS | Status: AC
  Administered 2016-09-16: 510 mg via INTRAVENOUS
  Filled 2016-09-16: qty 17

## 2016-09-16 NOTE — Patient Instructions (Signed)

## 2016-09-17 ENCOUNTER — Ambulatory Visit: Payer: Medicare Other

## 2016-10-14 DIAGNOSIS — G5603 Carpal tunnel syndrome, bilateral upper limbs: Secondary | ICD-10-CM | POA: Insufficient documentation

## 2016-10-16 ENCOUNTER — Other Ambulatory Visit: Payer: Self-pay

## 2016-11-11 ENCOUNTER — Other Ambulatory Visit: Payer: Self-pay

## 2016-11-11 DIAGNOSIS — D509 Iron deficiency anemia, unspecified: Secondary | ICD-10-CM

## 2016-11-12 NOTE — Progress Notes (Addendum)
11/14/2016 9:08 AM   [name] February 28, 1956 [id number]  Referring provider: Kirk Ruths, MD [facility] [place], [place] [address]  Chief Complaint  Patient presents with  . New Patient (Initial Visit)    gross hematuria referred by Laverta Baltimore MD    HPI: Patient is a 61 -year-old African-American female who presents today as a referral from their PCP, Dr. Burman Blacksmith Schermerhorn, for microscopic hematuria.    Patient was found to have microscopic hematuria on 03/10/2016 with 4-10 RBC's/hpf and 10-50 RBC's on 10/14/2016.    She does not have a prior history of recurrent urinary tract infections, nephrolithiasis, trauma to the genitourinary tract or malignancies of the genitourinary tract.   She states her son has passed a stone.  She denies any family history of  malignancies of the genitourinary tract or hematuria.   Today, she is having symptoms of frequent urination (every ten to fifteen minutes), nocturia ( x 2 ) and incontinence (one pad daily).  She states that she has been wearing incontinence pads for years and she states she has noticed her urine being of a dark color for years.  Her UA today demonstrates 6-30 RBC's and many bacteria.    She is not experiencing any suprapubic pain, abdominal pain and  flank pain.  She has a history of chronic abdominal pain and back pain.  She denies any recent fevers, chills, nausea or vomiting.   She underwent a contrast CT in 12/2014 for evaluation of weight loss.   It noted mild scarring in both kidneys.    She is a smoker.  2 ppd.  She works as a Electrical engineer.  She has a high BMI.    She had last seen Dr. Ouida Sills on 11/05/2016 for post menopausal bleeding.    PMH: Past Medical History:  Diagnosis Date  . Arthritis   . Cancer ([place])    cervical  . COPD (chronic obstructive pulmonary disease) ([place])   . Depression   . Diabetes mellitus without complication ([place])     . GERD (gastroesophageal reflux disease)   . Hypertension   . Schizophrenia ([place])   . Sleep apnea     Surgical History: Past Surgical History:  Procedure Laterality Date  . APPENDECTOMY    . COLONOSCOPY WITH PROPOFOL N/A 12/25/2014   Procedure: COLONOSCOPY WITH PROPOFOL;  Surgeon: Josefine Class, MD;  Location: [facility] ENDOSCOPY;  Service: Endoscopy;  Laterality: N/A;  . ESOPHAGOGASTRODUODENOSCOPY (EGD) WITH PROPOFOL N/A 12/25/2014   Procedure: ESOPHAGOGASTRODUODENOSCOPY (EGD) WITH PROPOFOL;  Surgeon: Josefine Class, MD;  Location[facility] ENDOSCOPY;  Service: Endoscopy;  Laterality: N/A;  . JOINT REPLACEMENT     right hip  . TOTAL HIP ARTHROPLASTY Left 05/16/2016   Procedure: LEFT TOTAL HIP ARTHROPLASTY ANTERIOR APPROACH;  Surgeon: Mcarthur Rossetti, MD;  Location: [facility] ORS;  Service: Orthopedics;  Laterality: Left;    Home Medications:  Allergies as of 11/14/2016      Reactions   Iodine Swelling   Penicillins Swelling   Has patient had a PCN reaction causing immediate rash, facial/tongue/throat swelling, SOB or lightheadedness with hypotension: Yes Has patient had a PCN reaction causing severe rash involving mucus membranes or skin necrosis: Yes Has patient had a PCN reaction that required hospitalization: No Has patient had a PCN reaction occurring within the last 10 years: No If all of the above answers are "NO", then may proceed with Cephalosporin use.  Medication List       Accurate as of 11/14/16  9:08 AM. Always use your most recent med list.          albuterol 108 (90 Base) MCG/ACT inhaler Commonly known as:  PROVENTIL HFA;VENTOLIN HFA Inhale 2 puffs into the lungs every 4 (four) hours as needed for wheezing or shortness of breath.   aspirin 81 MG chewable tablet Chew 1 tablet (81 mg total) by mouth 2 (two) times daily.   benzonatate 100 MG capsule Commonly known as:  TESSALON Take 100 mg by mouth daily.   budesonide-formoterol 160-4.5 MCG/ACT  inhaler Commonly known as:  SYMBICORT Inhale 2 puffs into the lungs 2 (two) times daily.   SYMBICORT 160-4.5 MCG/ACT inhaler Generic drug:  budesonide-formoterol inhale 2 puffs by mouth twice a day   buPROPion 75 MG tablet Commonly known as:  WELLBUTRIN Take 75 mg by mouth 2 (two) times daily.   cyclobenzaprine 10 MG tablet Commonly known as:  FLEXERIL Take 1 tablet (10 mg total) by mouth 3 (three) times daily as needed for muscle spasms.   dicyclomine 10 MG capsule Commonly known as:  BENTYL Take 10 mg by mouth 3 (three) times daily as needed for spasms.   diltiazem 180 MG 24 hr capsule Commonly known as:  CARDIZEM CD Take 180 mg by mouth daily.   diphenhydrAMINE 50 MG tablet Commonly known as:  BENADRYL Take one tablet one hour prior to CTU   enalapril 20 MG tablet Commonly known as:  VASOTEC take 1 tablet by mouth twice a day   gabapentin 100 MG capsule Commonly known as:  NEURONTIN Take 1 capsule (100 mg total) by mouth 3 (three) times daily.   hydrochlorothiazide 25 MG tablet Commonly known as:  HYDRODIURIL Take 25 mg by mouth daily.   hyoscyamine 0.375 MG 12 hr tablet Commonly known as:  LEVBID Take by mouth.   IFEREX 150 150 MG capsule Generic drug:  iron polysaccharides take 1 capsule by mouth twice a day   insulin glargine 100 UNIT/ML injection Commonly known as:  LANTUS Inject 15 Units into the skin at bedtime.   ipratropium-albuterol 0.5-2.5 (3) MG/3ML Soln Commonly known as:  DUONEB Take 3 mLs by nebulization every 6 (six) hours as needed.   losartan 50 MG tablet Commonly known as:  COZAAR Take by mouth.   metFORMIN 500 MG tablet Commonly known as:  GLUCOPHAGE Take 500 mg by mouth 2 (two) times daily with a meal.   omeprazole 20 MG capsule Commonly known as:  PRILOSEC Take 20 mg by mouth 2 (two) times daily before a meal.   oxybutynin 10 MG 24 hr tablet Commonly known as:  DITROPAN-XL Take 10 mg by mouth at bedtime.    oxyCODONE-acetaminophen 7.5-325 MG tablet Commonly known as:  PERCOCET Take 1-2 tablets by mouth every 6 (six) hours as needed for severe pain.   predniSONE 50 MG tablet Commonly known as:  DELTASONE Take the one tablet 13 hours, 7 hours and 1 hours prior to the CT Urogram   ranitidine 150 MG tablet Commonly known as:  ZANTAC Take one tablet 1 hour prior to CT Urogram   triamcinolone 0.1 % paste Commonly known as:  KENALOG Use as directed 1 application in the mouth or throat daily.   triamcinolone cream 0.1 % Commonly known as:  KENALOG Apply 1 application topically 2 (two) times daily.       Allergies:  Allergies  Allergen Reactions  . Iodine Swelling  . Penicillins  Swelling    Has patient had a PCN reaction causing immediate rash, facial/tongue/throat swelling, SOB or lightheadedness with hypotension: Yes Has patient had a PCN reaction causing severe rash involving mucus membranes or skin necrosis: Yes Has patient had a PCN reaction that required hospitalization: No Has patient had a PCN reaction occurring within the last 10 years: No If all of the above answers are "NO", then may proceed with Cephalosporin use.     Family History: Family History  Problem Relation Age of Onset  . Hypertension Mother   . Diabetes Mellitus II Mother   . Breast cancer Neg Hx   . Kidney cancer Neg Hx   . Bladder Cancer Neg Hx     Social History:  reports that she has been smoking.  She has been smoking about 1.00 pack per day. She has never used smokeless tobacco. She reports that she does not drink alcohol or use drugs.  ROS: UROLOGY Frequent Urination?: Yes Hard to postpone urination?: No Burning/pain with urination?: No Get up at night to urinate?: Yes Leakage of urine?: Yes Urine stream starts and stops?: No Trouble starting stream?: No Do you have to strain to urinate?: No Blood in urine?: Yes Urinary tract infection?: No Sexually transmitted disease?: No Injury to  kidneys or bladder?: No Painful intercourse?: No Weak stream?: No Currently pregnant?: No Vaginal bleeding?: No Last menstrual period?: 1999  Gastrointestinal Nausea?: No Vomiting?: No Indigestion/heartburn?: No Diarrhea?: No Constipation?: No  Constitutional Fever: No Night sweats?: No Weight loss?: No Fatigue?: No  Skin Skin rash/lesions?: Yes Itching?: No  Eyes Blurred vision?: No Double vision?: No  Ears/Nose/Throat Sore throat?: No Sinus problems?: No  Hematologic/Lymphatic Swollen glands?: No Easy bruising?: No  Cardiovascular Leg swelling?: No Chest pain?: No  Respiratory Cough?: Yes Shortness of breath?: Yes  Endocrine Excessive thirst?: Yes  Musculoskeletal Back pain?: Yes Joint pain?: Yes  Neurological Headaches?: No Dizziness?: Yes  Psychologic Depression?: No Anxiety?: No  Physical Exam: BP 135/76   Pulse 96   Ht 5' 5"  (1.651 m)   Wt 208 lb (94.3 kg)   LMP  (LMP Unknown) Comment: postmenopausal  BMI 34.61 kg/m   Constitutional: Well nourished. Alert and oriented, No acute distress. HEENT: [place] AT, moist mucus membranes. Trachea midline, no masses. Cardiovascular: No clubbing, cyanosis, or edema. Respiratory: Normal respiratory effort, no increased work of breathing. GI: Abdomen is soft, non tender, non distended, no abdominal masses. Liver and spleen not palpable.  No hernias appreciated.  Stool sample for occult testing is not indicated.   GU: No CVA tenderness.  No bladder fullness or masses.   Skin: No rashes, bruises or suspicious lesions. Lymph: No cervical or inguinal adenopathy. Neurologic: Grossly intact, no focal deficits, moving all 4 extremities. Psychiatric: Normal mood and affect.  Laboratory Data: Lab Results  Component Value Date   WBC 6.2 09/10/2016   HGB 12.4 09/10/2016   HCT 38.5 09/10/2016   MCV 80.5 09/10/2016   PLT 248 09/10/2016    Lab Results  Component Value Date   CREATININE 0.69 09/10/2016     Lab Results  Component Value Date   HGBA1C 6.9 (H) 05/13/2016    I have reviewed the labs.  Urinalysis 6-30 RBC's and many bacteria.  See EPIC.    Pertinent Imaging: CLINICAL DATA:  Microcystic anemia. Weight loss. Recent negative colonoscopy and endoscopy. Weight loss.  EXAM: CT ABDOMEN AND PELVIS WITH CONTRAST  TECHNIQUE: Multidetector CT imaging of the abdomen and pelvis was performed using the  standard protocol following bolus administration of intravenous contrast.  CONTRAST:  169m OMNIPAQUE IOHEXOL 300 MG/ML  SOLN  COMPARISON:  09/26/2010  FINDINGS: Lower chest: Anterior right lung base volume loss. Mild cardiomegaly, without pericardial or pleural effusion.  Hepatobiliary: Motion degradation in the upper abdomen. Normal liver. Normal gallbladder, without biliary ductal dilatation.  Pancreas: Normal, without mass or ductal dilatation.  Spleen: Normal  Adrenals/Urinary Tract: Normal adrenal glands. Scarring in the upper poles of both kidneys is mild. No hydronephrosis. Degraded evaluation of the pelvis, secondary to beam hardening artifact from right hip arthroplasty. Grossly normal urinary bladder.  Stomach/Bowel: Normal stomach, without wall thickening. Colonic stool burden suggests constipation. Scattered colonic diverticula. Appendectomy. Normal small bowel. Probable surgical clips in the small bowel mesentery.  Vascular/Lymphatic: Aortic and branch vessel atherosclerosis. No abdominopelvic adenopathy.  Reproductive: Grossly normal uterus, without adnexal mass.  Other: No significant free fluid.  Musculoskeletal: Mild to moderate left hip osteoarthritis. Right hip arthroplasty. Degenerative disc disease throughout the lumbosacral spine. Prominent disc bulges at L3-4 and L4-5.  IMPRESSION: 1. Degradation secondary to motion in the abdomen and beam hardening artifact from right hip arthroplasty in the pelvis. 2. Given these  factors, no acute process in the abdomen or pelvis. 3.  Possible constipation.   Electronically Signed   By: KAbigail MiyamotoM.D.   On: 01/01/2015 17:03  I have independently reviewed the films.    Assessment & Plan:    1. Microscopic hematuria  - I explained to the patient that there are a number of causes that can be associated with blood in the urine, such as stones,  UTI's, damage to the urinary tract and/or cancer.  - At this time, I felt that the patient warranted further urologic evaluation.   The AUA guidelines state that a CT urogram is the preferred imaging study to evaluate hematuria.  - I explained to the patient that a contrast material will be injected into a vein and that in rare instances, an allergic reaction can result and may even life threatening   The patient denies any allergies to contrast and/or seafood and is taking metformin.  She has allergies to iodine and the prep medication are prescribed  - Her reproductive status is postmenopausal   - Following the imaging study,  I've recommended a cystoscopy. I described how this is performed, typically in an office setting with a flexible cystoscope. We described the risks, benefits, and possible side effects, the most common of which is a minor amount of blood in the urine and/or burning which usually resolves in 24 to 48 hours.    - The patient had the opportunity to ask questions which were answered. Based upon this discussion, the patient is willing to proceed. Therefore, I've ordered: a CT Urogram and cystoscopy.  - The patient will return following all of the above for discussion of the results.   - UA  - Urine culture  - BUN + creatinine    2. Iodine allergy  - prednisone, Zantac and Benadryl given for prep   Return for CT Urogram report and cystoscopy.  These notes generated with voice recognition software. I apologize for typographical errors.  SZ[facility] [address] [place] [phone]

## 2016-11-13 ENCOUNTER — Other Ambulatory Visit: Payer: Self-pay | Admitting: *Deleted

## 2016-11-13 DIAGNOSIS — R310 Gross hematuria: Secondary | ICD-10-CM

## 2016-11-14 ENCOUNTER — Encounter: Payer: Self-pay | Admitting: Urology

## 2016-11-14 ENCOUNTER — Other Ambulatory Visit
Admission: RE | Admit: 2016-11-14 | Discharge: 2016-11-14 | Disposition: A | Discharge status: Home / Self Care | Payer: Medicare Other | Source: Ambulatory Visit | Attending: Urology | Admitting: Urology

## 2016-11-14 ENCOUNTER — Ambulatory Visit (INDEPENDENT_AMBULATORY_CARE_PROVIDER_SITE_OTHER): Payer: Medicare Other | Admitting: Urology

## 2016-11-14 VITALS — BP 135/76 | HR 96 | Ht 65.0 in | Wt 208.0 lb

## 2016-11-14 DIAGNOSIS — Z888 Allergy status to other drugs, medicaments and biological substances status: Secondary | ICD-10-CM | POA: Diagnosis not present

## 2016-11-14 DIAGNOSIS — R3129 Other microscopic hematuria: Secondary | ICD-10-CM

## 2016-11-14 DIAGNOSIS — R310 Gross hematuria: Secondary | ICD-10-CM | POA: Diagnosis present

## 2016-11-14 LAB — URINALYSIS, COMPLETE (UACMP) WITH MICROSCOPIC
Bilirubin Urine: NEGATIVE
GLUCOSE, UA: NEGATIVE mg/dL
Ketones, ur: NEGATIVE mg/dL
LEUKOCYTES UA: NEGATIVE
Nitrite: NEGATIVE
PH: 6 (ref 5.0–8.0)
Protein, ur: NEGATIVE mg/dL
Specific Gravity, Urine: 1.015 (ref 1.005–1.030)

## 2016-11-14 LAB — CREATININE, SERUM
Creatinine, Ser: 0.68 mg/dL (ref 0.44–1.00)
GFR calc non Af Amer: >60 mL/min (ref >60)

## 2016-11-14 LAB — BUN: BUN: 8 mg/dL (ref 6–20)

## 2016-11-14 MED ORDER — RANITIDINE HCL 150 MG PO TABS: ORAL_TABLET | ORAL | 0 refills | Status: DC

## 2016-11-14 MED ORDER — PREDNISONE 50 MG PO TABS: ORAL_TABLET | ORAL | 0 refills | Status: DC

## 2016-11-14 MED ORDER — DIPHENHYDRAMINE HCL 50 MG PO TABS: ORAL_TABLET | ORAL | 0 refills | Status: DC

## 2016-11-14 NOTE — Patient Instructions (Addendum)
Hematuria, Adult Hematuria is blood in your urine. It can be caused by a bladder infection, kidney infection, prostate infection, kidney stone, or cancer of your urinary tract. Infections can usually be treated with medicine, and a kidney stone usually will pass through your urine. If neither of these is the cause of your hematuria, further workup to find out the reason may be needed. It is very important that you tell your health care provider about any blood you see in your urine, even if the blood stops without treatment or happens without causing pain. Blood in your urine that happens and then stops and then happens again can be a symptom of a very serious condition. Also, pain is not a symptom in the initial stages of many urinary cancers. Follow these instructions at home:  Drink lots of fluid, 3-4 quarts a day. If you have been diagnosed with an infection, cranberry juice is especially recommended, in addition to large amounts of water.  Avoid caffeine, tea, and carbonated beverages because they tend to irritate the bladder.  Avoid alcohol because it may irritate the prostate.  Take all medicines as directed by your health care provider.  If you were prescribed an antibiotic medicine, finish it all even if you start to feel better.  If you have been diagnosed with a kidney stone, follow your health care provider's instructions regarding straining your urine to catch the stone.  Empty your bladder often. Avoid holding urine for long periods of time.  After a bowel movement, women should cleanse front to back. Use each tissue only once.  Empty your bladder before and after sexual intercourse if you are a female. Contact a health care provider if:  You develop back pain.  You have a fever.  You have a feeling of sickness in your stomach (nausea) or vomiting.  Your symptoms are not better in 3 days. Return sooner if you are getting worse. Get help right away if:  You develop  severe vomiting and are unable to keep the medicine down.  You develop severe back or abdominal pain despite taking your medicines.  You begin passing a large amount of blood or clots in your urine.  You feel extremely weak or faint, or you pass out. This information is not intended to replace advice given to you by your health care provider. Make sure you discuss any questions you have with your health care provider. Document Released: 05/05/2005 Document Revised: 10/11/2015 Document Reviewed: 01/03/2013 Elsevier Interactive Patient Education  2017 [employer] A computed tomography (CT) scan is a specialized X-ray scan. It uses X-rays and a computer to make pictures of different areas of your body. A CT scan can offer more detailed information than a regular X-ray exam. The CT scan provides data about internal organs, soft tissue structures, blood vessels, and bones. The CT scanner is a large machine that takes pictures of your body as you move through the opening. Tell a health care provider about:  Any allergies you have.  All medicines you are taking, including vitamins, herbs, eye drops, creams, and over-the-counter medicines.  Any problems you or family members have had with anesthetic medicines.  Any blood disorders you have.  Any surgeries you have had.  Any medical conditions you have. What are the risks? Generally, this is a safe procedure. However, as with any procedure, problems can occur. Possible problems include:  An allergic reaction to the contrast material.  Development of cancer from excessive exposure to  radiation. The risk of this is small.  What happens before the procedure?  The day before the test, stop drinking caffeinated beverages. These include energy drinks, tea, soda, coffee, and hot chocolate.  On the day of the test: ? About 4 hours before the test, stop eating and drinking anything but water as advised by your health care  provider. ? Avoid wearing jewelry. You will have to partly or fully undress and wear a hospital gown. What happens during the procedure?  You will be asked to lie on a table with your arms above your head.  If contrast dye is to be used for the test, an IV tube will be inserted in your arm. The contrast dye will be injected into the IV tube. You might feel warm, or you may get a metallic taste in your mouth.  The table you will be lying on will move into a large machine that will do the scanning.  You will be able to see, hear, and talk to the person running the machine while you are in it. Follow that person's directions.  The CT machine will move around you to take pictures. Do not move while it is scanning. This helps to get a good image.  When the best possible pictures have been taken, the machine will be turned off. The table will be moved out of the machine. The IV tube will then be removed. What happens after the procedure? Ask your health care provider when to follow up for your test results. This information is not intended to replace advice given to you by your health care provider. Make sure you discuss any questions you have with your health care provider. Document Released: 06/12/2004 Document Revised: 10/11/2015 Document Reviewed: 01/10/2013 Elsevier Interactive Patient Education  2017 [employer].  Cystoscopy Cystoscopy is a procedure that is used to help diagnose and sometimes treat conditions that affect that lower urinary tract. The lower urinary tract includes the bladder and the tube that drains urine from the bladder out of the body (urethra). Cystoscopy is performed with a thin, tube-shaped instrument with a light and camera at the end (cystoscope). The cystoscope may be hard (rigid) or flexible, depending on the goal of the procedure.The cystoscope is inserted through the urethra, into the bladder. Cystoscopy may be recommended if you have:  Urinary tractinfections  that keep coming back (recurring).  Blood in the urine (hematuria).  Loss of bladder control (urinary incontinence) or an overactive bladder.  Unusual cells found in a urine sample.  A blockage in the urethra.  Painful urination.  An abnormality in the bladder found during an intravenous pyelogram (IVP) or CT scan.  Cystoscopy may also be done to remove a sample of tissue to be examined under a microscope (biopsy). Tell a health care provider about:  Any allergies you have.  All medicines you are taking, including vitamins, herbs, eye drops, creams, and over-the-counter medicines.  Any problems you or family members have had with anesthetic medicines.  Any blood disorders you have.  Any surgeries you have had.  Any medical conditions you have.  Whether you are pregnant or may be pregnant. What are the risks? Generally, this is a safe procedure. However, problems may occur, including:  Infection.  Bleeding.  Allergic reactions to medicines.  Damage to other structures or organs.  What happens before the procedure?  Ask your health care provider about: ? Changing or stopping your regular medicines. This is especially important if you are taking  diabetes medicines or blood thinners. ? Taking medicines such as aspirin and ibuprofen. These medicines can thin your blood. Do not take these medicines before your procedure if your health care provider instructs you not to.  Follow instructions from your health care provider about eating or drinking restrictions.  You may be given antibiotic medicine to help prevent infection.  You may have an exam or testing, such as X-rays of the bladder, urethra, or kidneys.  You may have urine tests to check for signs of infection.  Plan to have someone take you home after the procedure. What happens during the procedure?  To reduce your risk of infection,your health care team will wash or sanitize their hands.  You will be  given one or more of the following: ? A medicine to help you relax (sedative). ? A medicine to numb the area (local anesthetic).  The area around the opening of your urethra will be cleaned.  The cystoscope will be passed through your urethra into your bladder.  Germ-free (sterile)fluid will flow through the cystoscope to fill your bladder. The fluid will stretch your bladder so that your surgeon can clearly examine your bladder walls.  The cystoscope will be removed and your bladder will be emptied. The procedure may vary among health care providers and hospitals. What happens after the procedure?  You may have some soreness or pain in your abdomen and urethra. Medicines will be available to help you.  You may have some blood in your urine.  Do not drive for 24 hours if you received a sedative. This information is not intended to replace advice given to you by your health care provider. Make sure you discuss any questions you have with your health care provider. Document Released: 05/02/2000 Document Revised: 09/13/2015 Document Reviewed: 03/22/2015 Elsevier Interactive Patient Education  2017 [employer].

## 2016-11-16 LAB — URINE CULTURE

## 2016-11-17 ENCOUNTER — Telehealth: Payer: Self-pay

## 2016-11-17 MED ORDER — NITROFURANTOIN MONOHYD MACRO 100 MG PO CAPS: 100.0000 mg | ORAL_CAPSULE | Freq: Two times a day (BID) | ORAL | 0 refills | Status: DC

## 2016-11-17 NOTE — Telephone Encounter (Signed)
-----   Message from Nori Riis, PA-C sent at 11/16/2016  9:20 PM EDT ----- Please let Deanna Gay know that her urine culture was positive.  We need to treat with an antibiotic as she will be undergoing instrumentation in the future.  We need to call in Macrobid 100 mg, one capsule twice daily for seven days.

## 2016-11-17 NOTE — Telephone Encounter (Signed)
Spoke to patient. Gave results and instructions. Patient verbalized understanding.  Confirmed pharmacy. Sent Rx.

## 2016-11-28 ENCOUNTER — Ambulatory Visit
Admission: RE | Admit: 2016-11-28 | Discharge: 2016-11-28 | Disposition: A | Discharge status: Home / Self Care | Payer: Medicare Other | Source: Ambulatory Visit | Attending: Urology | Admitting: Urology

## 2016-11-28 ENCOUNTER — Ambulatory Visit: Admission: RE | Admit: 2016-11-28 | Payer: Medicare Other | Source: Ambulatory Visit

## 2016-11-28 DIAGNOSIS — R3129 Other microscopic hematuria: Secondary | ICD-10-CM | POA: Diagnosis present

## 2016-11-28 DIAGNOSIS — Z96643 Presence of artificial hip joint, bilateral: Secondary | ICD-10-CM | POA: Insufficient documentation

## 2016-11-28 DIAGNOSIS — R102 Pelvic and perineal pain: Secondary | ICD-10-CM | POA: Insufficient documentation

## 2016-11-28 DIAGNOSIS — Z9049 Acquired absence of other specified parts of digestive tract: Secondary | ICD-10-CM | POA: Insufficient documentation

## 2016-11-28 MED ORDER — IOPAMIDOL (ISOVUE-370) INJECTION 76%
125.0000 mL | Freq: Once | INTRAVENOUS | Status: AC | PRN
  Administered 2016-11-28: 125 mL via INTRAVENOUS

## 2016-12-04 ENCOUNTER — Ambulatory Visit (INDEPENDENT_AMBULATORY_CARE_PROVIDER_SITE_OTHER): Payer: Medicare Other | Admitting: Urology

## 2016-12-04 ENCOUNTER — Encounter: Payer: Self-pay | Admitting: Urology

## 2016-12-04 VITALS — BP 122/63 | HR 84 | Ht 65.0 in | Wt 204.0 lb

## 2016-12-04 DIAGNOSIS — N950 Postmenopausal bleeding: Secondary | ICD-10-CM | POA: Insufficient documentation

## 2016-12-04 DIAGNOSIS — R3129 Other microscopic hematuria: Secondary | ICD-10-CM

## 2016-12-04 LAB — URINALYSIS, COMPLETE
Bilirubin, UA: NEGATIVE
GLUCOSE, UA: NEGATIVE
KETONES UA: NEGATIVE
Leukocytes, UA: NEGATIVE
NITRITE UA: NEGATIVE
Protein, UA: NEGATIVE
Specific Gravity, UA: 1.01 (ref 1.005–1.030)
UUROB: 0.2 mg/dL (ref 0.2–1.0)
pH, UA: 6 (ref 5.0–7.5)

## 2016-12-04 LAB — MICROSCOPIC EXAMINATION
Bacteria, UA: NONE SEEN
WBC UA: NONE SEEN /HPF (ref 0–?)

## 2016-12-04 MED ORDER — CIPROFLOXACIN HCL 500 MG PO TABS
500.0000 mg | ORAL_TABLET | Freq: Once | ORAL | Status: AC
  Administered 2016-12-04: 500 mg via ORAL

## 2016-12-04 MED ORDER — LIDOCAINE HCL 2 % EX GEL
1.0000 "application " | Freq: Once | CUTANEOUS | Status: AC
  Administered 2016-12-04: 1 via URETHRAL

## 2016-12-04 NOTE — Progress Notes (Signed)
   12/04/16  CC:  Chief Complaint  Patient presents with  . Cysto    HPI: The patient is 61 year old female who presents today for completion of her microscopic hematuria workup. Her CT was negative for source.  Blood pressure 122/63, pulse 84, height 5' 5"  (1.651 m), weight 204 lb (92.5 kg). NED. A&Ox3.   No respiratory distress   Abd soft, NT, ND Normal external genitalia with patent urethral meatus  Cystoscopy Procedure Note  Patient identification was confirmed, informed consent was obtained, and patient was prepped using Betadine solution.  Lidocaine jelly was administered per urethral meatus.    Preoperative abx where received prior to procedure.    Procedure: - Flexible cystoscope introduced, without any difficulty.   - Thorough search of the bladder revealed:    normal urethral meatus    normal urothelium    no stones    no ulcers     no tumors    no urethral polyps    no trabeculation  - Ureteral orifices were normal in position and appearance.  Post-Procedure: - Patient tolerated the procedure well  Assessment/ Plan:  1. Microscopic hematuria -Negative work up. Follow up in one year for repeat urinalysis.  Nickie Retort, MD

## 2016-12-09 ENCOUNTER — Inpatient Hospital Stay: Payer: Medicare Other | Attending: Oncology

## 2016-12-09 DIAGNOSIS — Z79899 Other long term (current) drug therapy: Secondary | ICD-10-CM | POA: Insufficient documentation

## 2016-12-09 DIAGNOSIS — G4730 Sleep apnea, unspecified: Secondary | ICD-10-CM | POA: Insufficient documentation

## 2016-12-09 DIAGNOSIS — F329 Major depressive disorder, single episode, unspecified: Secondary | ICD-10-CM | POA: Diagnosis not present

## 2016-12-09 DIAGNOSIS — D509 Iron deficiency anemia, unspecified: Secondary | ICD-10-CM | POA: Diagnosis present

## 2016-12-09 DIAGNOSIS — R319 Hematuria, unspecified: Secondary | ICD-10-CM | POA: Diagnosis not present

## 2016-12-09 DIAGNOSIS — K219 Gastro-esophageal reflux disease without esophagitis: Secondary | ICD-10-CM | POA: Diagnosis not present

## 2016-12-09 DIAGNOSIS — F209 Schizophrenia, unspecified: Secondary | ICD-10-CM | POA: Insufficient documentation

## 2016-12-09 DIAGNOSIS — E1165 Type 2 diabetes mellitus with hyperglycemia: Secondary | ICD-10-CM | POA: Insufficient documentation

## 2016-12-09 DIAGNOSIS — Z794 Long term (current) use of insulin: Secondary | ICD-10-CM | POA: Diagnosis not present

## 2016-12-09 DIAGNOSIS — Z7982 Long term (current) use of aspirin: Secondary | ICD-10-CM | POA: Diagnosis not present

## 2016-12-09 DIAGNOSIS — F17210 Nicotine dependence, cigarettes, uncomplicated: Secondary | ICD-10-CM | POA: Diagnosis not present

## 2016-12-09 DIAGNOSIS — Z96643 Presence of artificial hip joint, bilateral: Secondary | ICD-10-CM | POA: Diagnosis not present

## 2016-12-09 DIAGNOSIS — I10 Essential (primary) hypertension: Secondary | ICD-10-CM | POA: Insufficient documentation

## 2016-12-09 DIAGNOSIS — J449 Chronic obstructive pulmonary disease, unspecified: Secondary | ICD-10-CM | POA: Insufficient documentation

## 2016-12-09 LAB — IRON AND TIBC
Iron: 72 ug/dL (ref 28–170)
SATURATION RATIOS: 18 % (ref 10.4–31.8)
TIBC: 397 ug/dL (ref 250–450)
UIBC: 325 ug/dL

## 2016-12-09 LAB — CBC WITH DIFFERENTIAL/PLATELET
BASOS ABS: 0.1 10*3/uL (ref 0–0.1)
BASOS PCT: 2 %
EOS ABS: 0.1 10*3/uL (ref 0–0.7)
EOS PCT: 1 %
HCT: 40.7 % (ref 35.0–47.0)
HEMOGLOBIN: 13.3 g/dL (ref 12.0–16.0)
LYMPHS ABS: 2.1 10*3/uL (ref 1.0–3.6)
Lymphocytes Relative: 28 %
MCH: 28.4 pg (ref 26.0–34.0)
MCHC: 32.7 g/dL (ref 32.0–36.0)
MCV: 86.7 fL (ref 80.0–100.0)
Monocytes Absolute: 0.6 10*3/uL (ref 0.2–0.9)
Monocytes Relative: 8 %
NEUTROS PCT: 61 %
Neutro Abs: 4.6 10*3/uL (ref 1.4–6.5)
PLATELETS: 232 10*3/uL (ref 150–440)
RBC: 4.69 MIL/uL (ref 3.80–5.20)
RDW: 17.4 % — ABNORMAL HIGH (ref 11.5–14.5)
WBC: 7.5 10*3/uL (ref 3.6–11.0)

## 2016-12-09 LAB — RETICULOCYTES
RBC.: 4.66 MIL/uL (ref 3.80–5.20)
RETIC CT PCT: 1.4 % (ref 0.4–3.1)
Retic Count, Absolute: 65.2 10*3/uL (ref 19.0–183.0)

## 2016-12-09 LAB — BASIC METABOLIC PANEL
ANION GAP: 9 (ref 5–15)
BUN: 11 mg/dL (ref 6–20)
CHLORIDE: 98 mmol/L — AB (ref 101–111)
CO2: 28 mmol/L (ref 22–32)
Calcium: 9.1 mg/dL (ref 8.9–10.3)
Creatinine, Ser: 0.77 mg/dL (ref 0.44–1.00)
Glucose, Bld: 159 mg/dL — ABNORMAL HIGH (ref 65–99)
POTASSIUM: 4.1 mmol/L (ref 3.5–5.1)
SODIUM: 135 mmol/L (ref 135–145)

## 2016-12-09 LAB — FERRITIN: FERRITIN: 55 ng/mL (ref 11–307)

## 2016-12-11 NOTE — Progress Notes (Signed)
[place]  Telephone:(336) [phone] Fax:(336) [phone]  ID: Deanna Gay OB: [birth year]  MR#: [id number]  AJG#:[id number]  Patient Care Team: Kirk Ruths, MD as PCP - General (Internal Medicine)  CHIEF COMPLAINT: Iron deficiency anemia.  INTERVAL HISTORY: Patient returns to clinic today for further evaluation and laboratory work. She feels improved since receiving IV iron several months ago. She continues to have chronic weakness and fatigue. She has no neurologic complaints. She denies any recent fevers. She has no chest pain or shortness of breath. She denies any nausea, vomiting, constipation, or diarrhea. She has no further melena. She is no urinary complaints. Patient offers no further specific complaints today.  REVIEW OF SYSTEMS:   Review of Systems  Constitutional: Positive for malaise/fatigue. Negative for weight loss.  Respiratory: Negative.  Negative for cough and shortness of breath.   Cardiovascular: Negative.  Negative for chest pain and leg swelling.  Gastrointestinal: Negative for abdominal pain, blood in stool and melena.  Genitourinary: Negative.  Negative for frequency and hematuria.  Musculoskeletal: Negative.   Skin: Negative.  Negative for rash.  Neurological: Positive for weakness.  Psychiatric/Behavioral: The patient is not nervous/anxious.     As per HPI. Otherwise, a complete review of systems is negative.  PAST MEDICAL HISTORY: Past Medical History:  Diagnosis Date  . Arthritis   . Cancer ([place])    cervical  . COPD (chronic obstructive pulmonary disease) ([place])   . Depression   . Diabetes mellitus without complication ([place])   . GERD (gastroesophageal reflux disease)   . Hypertension   . Schizophrenia ([place])   . Sleep apnea     PAST SURGICAL HISTORY: Past Surgical History:  Procedure Laterality Date  . APPENDECTOMY    . COLONOSCOPY WITH PROPOFOL N/A 12/25/2014   Procedure: COLONOSCOPY WITH PROPOFOL;  Surgeon:  Josefine Class, MD;  [facility];  Service: Endoscopy;  Laterality: N/A;  . ESOPHAGOGASTRODUODENOSCOPY (EGD) WITH PROPOFOL N/A 12/25/2014   Procedure: ESOPHAGOGASTRODUODENOSCOPY (EGD) WITH PROPOFOL;  Surgeon: Josefine Class, MD;  Location: [facility] ENDOSCOPY;  Service: Endoscopy;  Laterality: N/A;  . JOINT REPLACEMENT     right hip  . TOTAL HIP ARTHROPLASTY Left 05/16/2016   Procedure: LEFT TOTAL HIP ARTHROPLASTY ANTERIOR APPROACH;  Surgeon: Mcarthur Rossetti, MD;  Location: [facility] ORS;  Service: Orthopedics;  Laterality: Left;    FAMILY HISTORY Family History  Problem Relation Age of Onset  . Hypertension Mother   . Diabetes Mellitus II Mother   . Breast cancer Neg Hx   . Kidney cancer Neg Hx   . Bladder Cancer Neg Hx        ADVANCED DIRECTIVES:    HEALTH MAINTENANCE: Social History  Substance Use Topics  . Smoking status: Current Every Day Smoker    Packs/day: 1.00  . Smokeless tobacco: Never Used  . Alcohol use No     Colonoscopy:  PAP:  Bone density:  Lipid panel:  Allergies  Allergen Reactions  . Iodine Swelling  . Penicillins Swelling    Has patient had a PCN reaction causing immediate rash, facial/tongue/throat swelling, SOB or lightheadedness with hypotension: Yes Has patient had a PCN reaction causing severe rash involving mucus membranes or skin necrosis: Yes Has patient had a PCN reaction that required hospitalization: No Has patient had a PCN reaction occurring within the last 10 years: No If all of the above answers are "NO", then may proceed with Cephalosporin use.     Current Outpatient Prescriptions  Medication Sig Dispense  Refill  . albuterol (PROVENTIL HFA;VENTOLIN HFA) 108 (90 BASE) MCG/ACT inhaler Inhale 2 puffs into the lungs every 4 (four) hours as needed for wheezing or shortness of breath.     Marland Kitchen aspirin 81 MG chewable tablet Chew 1 tablet (81 mg total) by mouth 2 (two) times daily. 60 tablet 0  . atorvastatin (LIPITOR) 40  MG tablet Take by mouth.    . benzonatate (TESSALON) 100 MG capsule Take 100 mg by mouth daily.   0  . budesonide-formoterol (SYMBICORT) 160-4.5 MCG/ACT inhaler Inhale 2 puffs into the lungs 2 (two) times daily.    . budesonide-formoterol (SYMBICORT) 160-4.5 MCG/ACT inhaler inhale 2 puffs by mouth twice a day    . buPROPion (WELLBUTRIN) 75 MG tablet Take 75 mg by mouth 2 (two) times daily.    . cyclobenzaprine (FLEXERIL) 10 MG tablet Take 1 tablet (10 mg total) by mouth 3 (three) times daily as needed for muscle spasms. 30 tablet 0  . dicyclomine (BENTYL) 10 MG capsule Take 10 mg by mouth 3 (three) times daily as needed for spasms.    Marland Kitchen diltiazem (CARDIZEM CD) 180 MG 24 hr capsule Take 180 mg by mouth daily.    . diphenhydrAMINE (BENADRYL) 50 MG tablet Take one tablet one hour prior to CTU 1 tablet 0  . enalapril (VASOTEC) 20 MG tablet take 1 tablet by mouth twice a day    . gabapentin (NEURONTIN) 100 MG capsule Take 1 capsule (100 mg total) by mouth 3 (three) times daily. 60 capsule 0  . hydrochlorothiazide (HYDRODIURIL) 25 MG tablet Take 25 mg by mouth daily.    . hyoscyamine (LEVBID) 0.375 MG 12 hr tablet Take by mouth.    . IFEREX 150 150 MG capsule take 1 capsule by mouth twice a day 200 capsule 0  . insulin glargine (LANTUS) 100 UNIT/ML injection Inject 15 Units into the skin at bedtime.    Marland Kitchen ipratropium-albuterol (DUONEB) 0.5-2.5 (3) MG/3ML SOLN Take 3 mLs by nebulization every 6 (six) hours as needed.    Marland Kitchen losartan (COZAAR) 50 MG tablet Take by mouth.    . metFORMIN (GLUCOPHAGE) 500 MG tablet Take 500 mg by mouth 2 (two) times daily with a meal.     . omeprazole (PRILOSEC) 20 MG capsule Take 20 mg by mouth 2 (two) times daily before a meal.   0  . oxybutynin (DITROPAN-XL) 10 MG 24 hr tablet Take 10 mg by mouth at bedtime.    Marland Kitchen oxyCODONE-acetaminophen (PERCOCET) 7.5-325 MG tablet Take 1-2 tablets by mouth every 6 (six) hours as needed for severe pain. 30 tablet 0  . predniSONE (DELTASONE)  50 MG tablet Take the one tablet 13 hours, 7 hours and 1 hours prior to the CT Urogram 3 tablet 0  . ranitidine (ZANTAC) 150 MG tablet Take one tablet 1 hour prior to CT Urogram 1 tablet 0  . triamcinolone (KENALOG) 0.1 % paste Use as directed 1 application in the mouth or throat daily.  1  . triamcinolone cream (KENALOG) 0.1 % Apply 1 application topically 2 (two) times daily.     No current facility-administered medications for this visit.     OBJECTIVE: Vitals:   12/12/16 0940  BP: 112/72  Pulse: 78  Resp: 18  Temp: (!) 97 F (36.1 C)     Body mass index is 35.57 kg/m.    ECOG FS:0 - Asymptomatic  General: Well-developed, well-nourished, no acute distress. Eyes: Pink conjunctiva, anicteric sclera. Lungs: Clear to auscultation bilaterally. Heart: Regular  rate and rhythm. No rubs, murmurs, or gallops. Abdomen: Soft, nontender, nondistended. No organomegaly noted, normoactive bowel sounds. Musculoskeletal: No edema, cyanosis, or clubbing. Neuro: Alert, answering all questions appropriately. Cranial nerves grossly intact. Skin: No rashes or petechiae noted. Psych: Normal affect.  LAB RESULTS:  Lab Results  Component Value Date   NA 135 12/09/2016   K 4.1 12/09/2016   CL 98 (L) 12/09/2016   CO2 28 12/09/2016   GLUCOSE 159 (H) 12/09/2016   BUN 11 12/09/2016   CREATININE 0.77 12/09/2016   CALCIUM 9.1 12/09/2016   PROT 6.3 (L) 02/26/2015   ALBUMIN 3.0 (L) 02/26/2015   AST 13 (L) 02/26/2015   ALT 10 (L) 02/26/2015   ALKPHOS 70 02/26/2015   BILITOT 0.3 02/26/2015   GFRNONAA >60 12/09/2016   GFRAA >60 12/09/2016    Lab Results  Component Value Date   WBC 7.5 12/09/2016   NEUTROABS 4.6 12/09/2016   HGB 13.3 12/09/2016   HCT 40.7 12/09/2016   MCV 86.7 12/09/2016   PLT 232 12/09/2016   Lab Results  Component Value Date   FERRITIN 55 12/09/2016   Lab Results  Component Value Date   IRON 72 12/09/2016   TIBC 397 12/09/2016   IRONPCTSAT 18 12/09/2016       STUDIES: Ct Hematuria Workup  Result Date: 11/28/2016 CLINICAL DATA:  Hematuria, anterior pelvic pain, status post appendectomy, bilateral hip replacement EXAM: CT ABDOMEN AND PELVIS WITHOUT AND WITH CONTRAST TECHNIQUE: Multidetector CT imaging of the abdomen and pelvis was performed following the standard protocol before and following the bolus administration of intravenous contrast. CONTRAST:  125 mL Isovue 300 IV COMPARISON:  01/01/2015 FINDINGS: Lower chest: Lung bases are clear. Hepatobiliary: Liver is within normal limits, noting a mildly macronodular contour. Gallbladder is unremarkable. No intrahepatic or extrahepatic duct dilatation. Pancreas: Within normal limits. Spleen: Within normal limits. Adrenals/Urinary Tract: Adrenal glands are within normal limits. Mild cortical lobulation the kidneys bilaterally. No enhancing renal lesions. No renal or ureteral calculi.  No hydronephrosis. On delayed imaging, there are no filling defects in the bilateral opacified proximal collecting systems, ureters, or bladder. Bladder is partially obscured by streak artifact but is unremarkable. Stomach/Bowel: Stomach is within normal limits. No evidence of bowel obstruction. Prior appendectomy. Sigmoid diverticulosis without evidence of diverticulitis. Vascular/Lymphatic: No evidence of abdominal aortic aneurysm. Atherosclerotic calcifications of the abdominal aorta and branch vessels. No suspicious abdominopelvic lymphadenopathy. Reproductive: Uterus is within normal limits. No adnexal masses. Other: No abdominopelvic ascites. Musculoskeletal: Mild degenerative changes of the visualized thoracolumbar spine. Bilateral hip arthroplasty, without evidence of complication. IMPRESSION: No CT findings to account for the patient's hematuria. No CT findings to account for the patient's anterior pelvic pain. Status post appendectomy and bilateral hip arthroplasty. Additional ancillary findings as above. Electronically  Signed   By: Julian Hy M.D.   On: 11/28/2016 11:19    ASSESSMENT: Iron deficiency anemia.  PLAN:    1. Iron deficiency anemia: Patient's hemoglobin and iron stores are now within normal limits. Previously, the remainder of her blood work was either negative or within normal limits. She does not require 510 mg IV Feraheme today. Return to clinic in 6 months with repeat laboratory work and further evaluation.  2. Hyperglycemia: Continue insulin and diabetic medications as prescribed. 3. Hypertension: Patient's blood pressure is within normal limits today, continue current medications as prescribed. 4. Black tarry stools: Resolved. Patient reports GI workup was negative. 5. Hematuria: Patient was seen by urology with no obvious pathology. CT scan  results as above.  Patient expressed understanding and was in agreement with this plan. She also understands that She can call clinic at any time with any questions, concerns, or complaints.    Lloyd Huger, MD   12/13/2016 8:01 AM

## 2016-12-12 ENCOUNTER — Other Ambulatory Visit: Payer: Medicare Other

## 2016-12-12 ENCOUNTER — Inpatient Hospital Stay: Payer: Medicare Other

## 2016-12-12 ENCOUNTER — Inpatient Hospital Stay (HOSPITAL_BASED_OUTPATIENT_CLINIC_OR_DEPARTMENT_OTHER): Payer: Medicare Other | Admitting: Oncology

## 2016-12-12 VITALS — BP 112/72 | HR 78 | Temp 97.0°F | Resp 18 | Wt 213.7 lb

## 2016-12-12 DIAGNOSIS — Z79899 Other long term (current) drug therapy: Secondary | ICD-10-CM

## 2016-12-12 DIAGNOSIS — D509 Iron deficiency anemia, unspecified: Secondary | ICD-10-CM | POA: Diagnosis not present

## 2016-12-12 DIAGNOSIS — F17210 Nicotine dependence, cigarettes, uncomplicated: Secondary | ICD-10-CM

## 2016-12-12 DIAGNOSIS — R319 Hematuria, unspecified: Secondary | ICD-10-CM

## 2016-12-12 NOTE — Progress Notes (Signed)
Patient is here for follow up, she mentions some swelling. She is not eating

## 2017-06-14 NOTE — Progress Notes (Signed)
[place]  Telephone:(336) [phone] Fax:(336) [phone]  ID: Deanna Gay OB: 1955-09-18  MR#: [id number]  FSE#:[id number]  Patient Care Team: Kirk Ruths, MD as PCP - General (Internal Medicine)  CHIEF COMPLAINT: Iron deficiency anemia.  INTERVAL HISTORY: Patient returns to clinic today for further evaluation and laboratory work. She continues to complain of extreme chronic weakness and fatigue, but otherwise feels well. She has no neurologic complaints. She denies any recent fevers or illnesses. She has no chest pain or shortness of breath. She denies any nausea, vomiting, constipation, or diarrhea. She has no melena or hematochezia.  She has no urinary complaints. Patient offers no further specific complaints today.  REVIEW OF SYSTEMS:   Review of Systems  Constitutional: Positive for malaise/fatigue. Negative for weight loss.  Respiratory: Negative.  Negative for cough and shortness of breath.   Cardiovascular: Negative.  Negative for chest pain and leg swelling.  Gastrointestinal: Negative for abdominal pain, blood in stool and melena.  Genitourinary: Negative.  Negative for frequency and hematuria.  Musculoskeletal: Negative.   Skin: Negative.  Negative for rash.  Neurological: Positive for weakness.  Psychiatric/Behavioral: The patient is not nervous/anxious.     As per HPI. Otherwise, a complete review of systems is negative.  PAST MEDICAL HISTORY: Past Medical History:  Diagnosis Date  . Arthritis   . Cancer ([place])    cervical  . COPD (chronic obstructive pulmonary disease) ([place])   . Depression   . Diabetes mellitus without complication ([place])   . GERD (gastroesophageal reflux disease)   . Hypertension   . Schizophrenia ([place])   . Sleep apnea     PAST SURGICAL HISTORY: Past Surgical History:  Procedure Laterality Date  . APPENDECTOMY    . COLONOSCOPY WITH PROPOFOL N/A 12/25/2014   Procedure: COLONOSCOPY WITH PROPOFOL;  Surgeon:  Josefine Class, MD;  Location[facility] ENDOSCOPY;  Service: Endoscopy;  Laterality: N/A;  . ESOPHAGOGASTRODUODENOSCOPY (EGD) WITH PROPOFOL N/A 12/25/2014   Procedure: ESOPHAGOGASTRODUODENOSCOPY (EGD) WITH PROPOFOL;  Surgeon: Josefine Class, MD;  Location: [facility] ENDOSCOPY;  Service: Endoscopy;  Laterality: N/A;  . JOINT REPLACEMENT     right hip  . TOTAL HIP ARTHROPLASTY Left 05/16/2016   Procedure: LEFT TOTAL HIP ARTHROPLASTY ANTERIOR APPROACH;  Surgeon: Mcarthur Rossetti, MD;  Location: [facility] ORS;  Service: Orthopedics;  Laterality: Left;    FAMILY HISTORY Family History  Problem Relation Age of Onset  . Hypertension Mother   . Diabetes Mellitus II Mother   . Breast cancer Neg Hx   . Kidney cancer Neg Hx   . Bladder Cancer Neg Hx        ADVANCED DIRECTIVES:    HEALTH MAINTENANCE: Social History   Tobacco Use  . Smoking status: Current Every Day Smoker    Packs/day: 1.00  . Smokeless tobacco: Never Used  Substance Use Topics  . Alcohol use: No  . Drug use: No     Colonoscopy:  PAP:  Bone density:  Lipid panel:  Allergies  Allergen Reactions  . Iodine Swelling  . Penicillins Swelling    Has patient had a PCN reaction causing immediate rash, facial/tongue/throat swelling, SOB or lightheadedness with hypotension: Yes Has patient had a PCN reaction causing severe rash involving mucus membranes or skin necrosis: Yes Has patient had a PCN reaction that required hospitalization: No Has patient had a PCN reaction occurring within the last 10 years: No If all of the above answers are "NO", then may proceed with Cephalosporin use.  Current Outpatient Medications  Medication Sig Dispense Refill  . albuterol (PROVENTIL HFA;VENTOLIN HFA) 108 (90 BASE) MCG/ACT inhaler Inhale 2 puffs into the lungs every 4 (four) hours as needed for wheezing or shortness of breath.     Marland Kitchen aspirin 81 MG chewable tablet Chew 1 tablet (81 mg total) by mouth 2 (two) times daily. 60 tablet  0  . atorvastatin (LIPITOR) 40 MG tablet Take by mouth.    . benzonatate (TESSALON) 100 MG capsule Take 100 mg by mouth daily.   0  . budesonide-formoterol (SYMBICORT) 160-4.5 MCG/ACT inhaler Inhale 2 puffs into the lungs 2 (two) times daily.    . budesonide-formoterol (SYMBICORT) 160-4.5 MCG/ACT inhaler inhale 2 puffs by mouth twice a day    . buPROPion (WELLBUTRIN) 75 MG tablet Take 75 mg by mouth 2 (two) times daily.    . cyclobenzaprine (FLEXERIL) 10 MG tablet Take 1 tablet (10 mg total) by mouth 3 (three) times daily as needed for muscle spasms. 30 tablet 0  . dicyclomine (BENTYL) 10 MG capsule Take 10 mg by mouth 3 (three) times daily as needed for spasms.    Marland Kitchen diltiazem (CARDIZEM CD) 180 MG 24 hr capsule Take 180 mg by mouth daily.    . diphenhydrAMINE (BENADRYL) 50 MG tablet Take one tablet one hour prior to CTU 1 tablet 0  . enalapril (VASOTEC) 20 MG tablet take 1 tablet by mouth twice a day    . gabapentin (NEURONTIN) 100 MG capsule Take 1 capsule (100 mg total) by mouth 3 (three) times daily. 60 capsule 0  . hydrochlorothiazide (HYDRODIURIL) 25 MG tablet Take 25 mg by mouth daily.    . IFEREX 150 150 MG capsule take 1 capsule by mouth twice a day 200 capsule 0  . insulin glargine (LANTUS) 100 UNIT/ML injection Inject 15 Units into the skin at bedtime.    Marland Kitchen ipratropium-albuterol (DUONEB) 0.5-2.5 (3) MG/3ML SOLN Take 3 mLs by nebulization every 6 (six) hours as needed.    Marland Kitchen losartan (COZAAR) 50 MG tablet Take by mouth.    . metFORMIN (GLUCOPHAGE) 500 MG tablet Take 500 mg by mouth 2 (two) times daily with a meal.     . omeprazole (PRILOSEC) 20 MG capsule Take 20 mg by mouth 2 (two) times daily before a meal.   0  . oxybutynin (DITROPAN-XL) 10 MG 24 hr tablet Take 10 mg by mouth at bedtime.    Marland Kitchen oxyCODONE-acetaminophen (PERCOCET) 7.5-325 MG tablet Take 1-2 tablets by mouth every 6 (six) hours as needed for severe pain. 30 tablet 0  . predniSONE (DELTASONE) 50 MG tablet Take the one  tablet 13 hours, 7 hours and 1 hours prior to the CT Urogram 3 tablet 0  . ranitidine (ZANTAC) 150 MG tablet Take one tablet 1 hour prior to CT Urogram 1 tablet 0  . triamcinolone (KENALOG) 0.1 % paste Use as directed 1 application in the mouth or throat daily.  1  . triamcinolone cream (KENALOG) 0.1 % Apply 1 application topically 2 (two) times daily.    . hyoscyamine (LEVBID) 0.375 MG 12 hr tablet Take by mouth.     No current facility-administered medications for this visit.     OBJECTIVE: Vitals:   06/19/17 1000  BP: 136/76  Pulse: 99  Resp: 20  Temp: 97.8 F (36.6 C)     Body mass index is 38.19 kg/m.    ECOG FS:0 - Asymptomatic  General: Well-developed, well-nourished, no acute distress. Eyes: Pink conjunctiva, anicteric sclera. Lungs:  Clear to auscultation bilaterally. Heart: Regular rate and rhythm. No rubs, murmurs, or gallops. Abdomen: Soft, nontender, nondistended. No organomegaly noted, normoactive bowel sounds. Musculoskeletal: No edema, cyanosis, or clubbing. Neuro: Alert, answering all questions appropriately. Cranial nerves grossly intact. Skin: No rashes or petechiae noted. Psych: Normal affect.  LAB RESULTS:  Lab Results  Component Value Date   NA 135 12/09/2016   K 4.1 12/09/2016   CL 98 (L) 12/09/2016   CO2 28 12/09/2016   GLUCOSE 159 (H) 12/09/2016   BUN 11 12/09/2016   CREATININE 0.77 12/09/2016   CALCIUM 9.1 12/09/2016   PROT 6.3 (L) 02/26/2015   ALBUMIN 3.0 (L) 02/26/2015   AST 13 (L) 02/26/2015   ALT 10 (L) 02/26/2015   ALKPHOS 70 02/26/2015   BILITOT 0.3 02/26/2015   GFRNONAA >60 12/09/2016   GFRAA >60 12/09/2016    Lab Results  Component Value Date   WBC 6.5 06/16/2017   NEUTROABS 4.4 06/16/2017   HGB 12.9 06/16/2017   HCT 39.4 06/16/2017   MCV 84.6 06/16/2017   PLT 233 06/16/2017   Lab Results  Component Value Date   FERRITIN 32 06/16/2017   Lab Results  Component Value Date   IRON 47 06/16/2017   TIBC 401 06/16/2017    IRONPCTSAT 12 06/16/2017      STUDIES: No results found.  ASSESSMENT: Iron deficiency anemia.  PLAN:    1. Iron deficiency anemia: Patient's hemoglobin and iron stores continue to be within normal limits. Previously, the remainder of her blood work was either negative or within normal limits.  Patient last received IV Feraheme on Sep 16, 2016.  She does not require 510 mg IV Feraheme today. Return to clinic in 6 months with repeat laboratory work and further evaluation.  2. Hyperglycemia: Continue insulin and diabetic medications as prescribed. 3. Hypertension: Patient's blood pressure is within normal limits today, continue current medications as prescribed. 4. Black tarry stools: Resolved. Patient reports GI workup was negative. 5. Hematuria: Patient was seen by urology with no obvious pathology.  6.  Weakness and fatigue: Unrelated to iron deficiency anemia.  Patient reports she has an appointment with her primary care physician next week.  Will draw thyroid panel today for completeness which can be followed up by her primary care physician.  Patient expressed understanding and was in agreement with this plan. She also understands that She can call clinic at any time with any questions, concerns, or complaints.    Lloyd Huger, MD   06/19/2017 10:57 AM

## 2017-06-16 ENCOUNTER — Inpatient Hospital Stay: Payer: Medicare Other | Attending: Oncology

## 2017-06-16 DIAGNOSIS — F329 Major depressive disorder, single episode, unspecified: Secondary | ICD-10-CM | POA: Insufficient documentation

## 2017-06-16 DIAGNOSIS — J449 Chronic obstructive pulmonary disease, unspecified: Secondary | ICD-10-CM | POA: Insufficient documentation

## 2017-06-16 DIAGNOSIS — Z7982 Long term (current) use of aspirin: Secondary | ICD-10-CM | POA: Diagnosis not present

## 2017-06-16 DIAGNOSIS — Z96643 Presence of artificial hip joint, bilateral: Secondary | ICD-10-CM | POA: Diagnosis not present

## 2017-06-16 DIAGNOSIS — E1165 Type 2 diabetes mellitus with hyperglycemia: Secondary | ICD-10-CM | POA: Insufficient documentation

## 2017-06-16 DIAGNOSIS — G4730 Sleep apnea, unspecified: Secondary | ICD-10-CM | POA: Diagnosis not present

## 2017-06-16 DIAGNOSIS — Z794 Long term (current) use of insulin: Secondary | ICD-10-CM | POA: Insufficient documentation

## 2017-06-16 DIAGNOSIS — D509 Iron deficiency anemia, unspecified: Secondary | ICD-10-CM | POA: Insufficient documentation

## 2017-06-16 DIAGNOSIS — K219 Gastro-esophageal reflux disease without esophagitis: Secondary | ICD-10-CM | POA: Diagnosis not present

## 2017-06-16 DIAGNOSIS — F209 Schizophrenia, unspecified: Secondary | ICD-10-CM | POA: Insufficient documentation

## 2017-06-16 DIAGNOSIS — Z79899 Other long term (current) drug therapy: Secondary | ICD-10-CM | POA: Diagnosis not present

## 2017-06-16 DIAGNOSIS — I10 Essential (primary) hypertension: Secondary | ICD-10-CM | POA: Diagnosis not present

## 2017-06-16 DIAGNOSIS — F17210 Nicotine dependence, cigarettes, uncomplicated: Secondary | ICD-10-CM | POA: Insufficient documentation

## 2017-06-16 DIAGNOSIS — R319 Hematuria, unspecified: Secondary | ICD-10-CM | POA: Diagnosis not present

## 2017-06-16 LAB — CBC WITH DIFFERENTIAL/PLATELET
BASOS ABS: 0.1 10*3/uL (ref 0–0.1)
BASOS PCT: 1 %
EOS ABS: 0.1 10*3/uL (ref 0–0.7)
Eosinophils Relative: 1 %
HCT: 39.4 % (ref 35.0–47.0)
HEMOGLOBIN: 12.9 g/dL (ref 12.0–16.0)
Lymphocytes Relative: 26 %
Lymphs Abs: 1.7 10*3/uL (ref 1.0–3.6)
MCH: 27.7 pg (ref 26.0–34.0)
MCHC: 32.8 g/dL (ref 32.0–36.0)
MCV: 84.6 fL (ref 80.0–100.0)
MONOS PCT: 4 %
Monocytes Absolute: 0.3 10*3/uL (ref 0.2–0.9)
NEUTROS ABS: 4.4 10*3/uL (ref 1.4–6.5)
NEUTROS PCT: 68 %
Platelets: 233 10*3/uL (ref 150–440)
RBC: 4.66 MIL/uL (ref 3.80–5.20)
RDW: 15.2 % — ABNORMAL HIGH (ref 11.5–14.5)
WBC: 6.5 10*3/uL (ref 3.6–11.0)

## 2017-06-16 LAB — IRON AND TIBC
Iron: 47 ug/dL (ref 28–170)
Saturation Ratios: 12 % (ref 10.4–31.8)
TIBC: 401 ug/dL (ref 250–450)
UIBC: 354 ug/dL

## 2017-06-16 LAB — FERRITIN: FERRITIN: 32 ng/mL (ref 11–307)

## 2017-06-19 ENCOUNTER — Inpatient Hospital Stay: Payer: Medicare Other | Attending: Oncology | Admitting: Oncology

## 2017-06-19 ENCOUNTER — Inpatient Hospital Stay: Payer: Medicare Other

## 2017-06-19 VITALS — BP 136/76 | HR 99 | Temp 97.8°F | Resp 20 | Wt 229.5 lb

## 2017-06-19 DIAGNOSIS — F17210 Nicotine dependence, cigarettes, uncomplicated: Secondary | ICD-10-CM | POA: Diagnosis not present

## 2017-06-19 DIAGNOSIS — R319 Hematuria, unspecified: Secondary | ICD-10-CM | POA: Diagnosis not present

## 2017-06-19 DIAGNOSIS — J449 Chronic obstructive pulmonary disease, unspecified: Secondary | ICD-10-CM | POA: Diagnosis not present

## 2017-06-19 DIAGNOSIS — G4730 Sleep apnea, unspecified: Secondary | ICD-10-CM | POA: Insufficient documentation

## 2017-06-19 DIAGNOSIS — F329 Major depressive disorder, single episode, unspecified: Secondary | ICD-10-CM | POA: Diagnosis not present

## 2017-06-19 DIAGNOSIS — F209 Schizophrenia, unspecified: Secondary | ICD-10-CM | POA: Diagnosis not present

## 2017-06-19 DIAGNOSIS — K219 Gastro-esophageal reflux disease without esophagitis: Secondary | ICD-10-CM | POA: Insufficient documentation

## 2017-06-19 DIAGNOSIS — I10 Essential (primary) hypertension: Secondary | ICD-10-CM | POA: Diagnosis not present

## 2017-06-19 DIAGNOSIS — E119 Type 2 diabetes mellitus without complications: Secondary | ICD-10-CM | POA: Insufficient documentation

## 2017-06-19 DIAGNOSIS — Z79899 Other long term (current) drug therapy: Secondary | ICD-10-CM | POA: Diagnosis not present

## 2017-06-19 DIAGNOSIS — R531 Weakness: Secondary | ICD-10-CM | POA: Insufficient documentation

## 2017-06-19 DIAGNOSIS — M1990 Unspecified osteoarthritis, unspecified site: Secondary | ICD-10-CM | POA: Diagnosis not present

## 2017-06-19 DIAGNOSIS — Z7982 Long term (current) use of aspirin: Secondary | ICD-10-CM | POA: Insufficient documentation

## 2017-06-19 DIAGNOSIS — R5383 Other fatigue: Secondary | ICD-10-CM

## 2017-06-19 DIAGNOSIS — D509 Iron deficiency anemia, unspecified: Secondary | ICD-10-CM | POA: Diagnosis present

## 2017-06-19 DIAGNOSIS — Z96642 Presence of left artificial hip joint: Secondary | ICD-10-CM | POA: Insufficient documentation

## 2017-06-19 DIAGNOSIS — Z794 Long term (current) use of insulin: Secondary | ICD-10-CM | POA: Diagnosis not present

## 2017-06-19 NOTE — Progress Notes (Signed)
Patient reports fatigue and weakness today.

## 2017-06-20 LAB — THYROID PANEL WITH TSH
FREE THYROXINE INDEX: 2.4 (ref 1.2–4.9)
T3 Uptake Ratio: 29 % (ref 24–39)
T4 TOTAL: 8.3 ug/dL (ref 4.5–12.0)
TSH: 1.39 u[IU]/mL (ref 0.450–4.500)

## 2017-06-23 ENCOUNTER — Other Ambulatory Visit: Payer: Self-pay | Admitting: *Deleted

## 2017-06-23 MED ORDER — POLYSACCHARIDE IRON COMPLEX 150 MG PO CAPS: 150.0000 mg | ORAL_CAPSULE | Freq: Two times a day (BID) | ORAL | 1 refills | Status: DC

## 2017-06-26 ENCOUNTER — Telehealth: Payer: Self-pay | Admitting: *Deleted

## 2017-06-26 DIAGNOSIS — Z87891 Personal history of nicotine dependence: Secondary | ICD-10-CM

## 2017-06-26 DIAGNOSIS — Z122 Encounter for screening for malignant neoplasm of respiratory organs: Secondary | ICD-10-CM

## 2017-06-26 NOTE — Telephone Encounter (Signed)
Received referral for initial lung cancer screening scan. Contacted patient and obtained smoking history,(current, 47pack year) as well as answering questions related to screening process. Patient denies signs of lung cancer such as weight loss or hemoptysis. Patient denies comorbidity that would prevent curative treatment if lung cancer were found. Patient is scheduled for shared decision making visit and CT scan on 07/15/17.

## 2017-07-14 ENCOUNTER — Encounter: Payer: Self-pay | Admitting: Oncology

## 2017-07-15 ENCOUNTER — Ambulatory Visit
Admission: RE | Admit: 2017-07-15 | Discharge: 2017-07-15 | Disposition: A | Discharge status: Home / Self Care | Payer: Medicare Other | Source: Ambulatory Visit | Attending: Oncology | Admitting: Oncology

## 2017-07-15 ENCOUNTER — Inpatient Hospital Stay (HOSPITAL_BASED_OUTPATIENT_CLINIC_OR_DEPARTMENT_OTHER): Payer: Medicare Other | Admitting: Oncology

## 2017-07-15 DIAGNOSIS — J439 Emphysema, unspecified: Secondary | ICD-10-CM | POA: Diagnosis not present

## 2017-07-15 DIAGNOSIS — F17210 Nicotine dependence, cigarettes, uncomplicated: Secondary | ICD-10-CM

## 2017-07-15 DIAGNOSIS — I700 Atherosclerosis of aorta: Secondary | ICD-10-CM | POA: Diagnosis not present

## 2017-07-15 DIAGNOSIS — Z122 Encounter for screening for malignant neoplasm of respiratory organs: Secondary | ICD-10-CM

## 2017-07-15 DIAGNOSIS — R160 Hepatomegaly, not elsewhere classified: Secondary | ICD-10-CM | POA: Diagnosis not present

## 2017-07-15 DIAGNOSIS — Z87891 Personal history of nicotine dependence: Secondary | ICD-10-CM

## 2017-07-15 NOTE — Progress Notes (Signed)
In accordance with CMS guidelines, patient has met eligibility criteria including age, absence of signs or symptoms of lung cancer.  Social History   Tobacco Use  . Smoking status: Current Every Day Smoker    Packs/day: 1.00    Years: 47.00    Pack years: 47.00  . Smokeless tobacco: Never Used  Substance Use Topics  . Alcohol use: No  . Drug use: No     A shared decision-making session was conducted prior to the performance of CT scan. This includes one or more decision aids, includes benefits and harms of screening, follow-up diagnostic testing, over-diagnosis, false positive rate, and total radiation exposure.  Counseling on the importance of adherence to annual lung cancer LDCT screening, impact of co-morbidities, and ability or willingness to undergo diagnosis and treatment is imperative for compliance of the program.  Counseling on the importance of continued smoking cessation for former smokers; the importance of smoking cessation for current smokers, and information about tobacco cessation interventions have been given to patient including [place] and [employer] quit [place] programs.  Written order for lung cancer screening with LDCT has been given to the patient and any and all questions have been answered to the best of my abilities.   Yearly follow up will be coordinated by Burgess Estelle, Thoracic Navigator.  Faythe Casa, NP 07/15/2017 9:16 AM

## 2017-07-17 ENCOUNTER — Encounter: Payer: Self-pay | Admitting: *Deleted

## 2017-07-19 ENCOUNTER — Emergency Department: Payer: Medicare Other

## 2017-07-19 ENCOUNTER — Encounter: Payer: Self-pay | Admitting: Emergency Medicine

## 2017-07-19 ENCOUNTER — Other Ambulatory Visit: Payer: Self-pay

## 2017-07-19 ENCOUNTER — Inpatient Hospital Stay
Admission: EM | Admit: 2017-07-19 | Discharge: 2017-07-20 | DRG: 190 | Disposition: A | Discharge status: Home / Self Care | Payer: Medicare Other | Attending: Internal Medicine | Admitting: Internal Medicine

## 2017-07-19 DIAGNOSIS — F209 Schizophrenia, unspecified: Secondary | ICD-10-CM | POA: Diagnosis not present

## 2017-07-19 DIAGNOSIS — F17210 Nicotine dependence, cigarettes, uncomplicated: Secondary | ICD-10-CM | POA: Diagnosis present

## 2017-07-19 DIAGNOSIS — Z96643 Presence of artificial hip joint, bilateral: Secondary | ICD-10-CM | POA: Diagnosis not present

## 2017-07-19 DIAGNOSIS — Z79899 Other long term (current) drug therapy: Secondary | ICD-10-CM

## 2017-07-19 DIAGNOSIS — F419 Anxiety disorder, unspecified: Secondary | ICD-10-CM | POA: Diagnosis not present

## 2017-07-19 DIAGNOSIS — J440 Chronic obstructive pulmonary disease with acute lower respiratory infection: Secondary | ICD-10-CM | POA: Diagnosis not present

## 2017-07-19 DIAGNOSIS — F329 Major depressive disorder, single episode, unspecified: Secondary | ICD-10-CM | POA: Diagnosis present

## 2017-07-19 DIAGNOSIS — J441 Chronic obstructive pulmonary disease with (acute) exacerbation: Secondary | ICD-10-CM | POA: Diagnosis not present

## 2017-07-19 DIAGNOSIS — Z794 Long term (current) use of insulin: Secondary | ICD-10-CM | POA: Diagnosis not present

## 2017-07-19 DIAGNOSIS — K219 Gastro-esophageal reflux disease without esophagitis: Secondary | ICD-10-CM | POA: Diagnosis not present

## 2017-07-19 DIAGNOSIS — G4730 Sleep apnea, unspecified: Secondary | ICD-10-CM | POA: Diagnosis present

## 2017-07-19 DIAGNOSIS — Z8541 Personal history of malignant neoplasm of cervix uteri: Secondary | ICD-10-CM

## 2017-07-19 DIAGNOSIS — G8929 Other chronic pain: Secondary | ICD-10-CM | POA: Diagnosis not present

## 2017-07-19 DIAGNOSIS — I10 Essential (primary) hypertension: Secondary | ICD-10-CM | POA: Diagnosis not present

## 2017-07-19 DIAGNOSIS — J181 Lobar pneumonia, unspecified organism: Secondary | ICD-10-CM | POA: Diagnosis present

## 2017-07-19 DIAGNOSIS — Z7982 Long term (current) use of aspirin: Secondary | ICD-10-CM

## 2017-07-19 DIAGNOSIS — J189 Pneumonia, unspecified organism: Secondary | ICD-10-CM

## 2017-07-19 DIAGNOSIS — E119 Type 2 diabetes mellitus without complications: Secondary | ICD-10-CM | POA: Diagnosis not present

## 2017-07-19 HISTORY — DX: Chronic obstructive pulmonary disease with (acute) exacerbation: J44.1

## 2017-07-19 LAB — MRSA PCR SCREENING: MRSA by PCR: NEGATIVE

## 2017-07-19 LAB — CBC
HCT: 36.8 % (ref 35.0–47.0)
HEMOGLOBIN: 11.9 g/dL — AB (ref 12.0–16.0)
MCH: 27.6 pg (ref 26.0–34.0)
MCHC: 32.2 g/dL (ref 32.0–36.0)
MCV: 85.8 fL (ref 80.0–100.0)
Platelets: 264 10*3/uL (ref 150–440)
RBC: 4.29 MIL/uL (ref 3.80–5.20)
RDW: 15.2 % — AB (ref 11.5–14.5)
WBC: 6.3 10*3/uL (ref 3.6–11.0)

## 2017-07-19 LAB — INFLUENZA PANEL BY PCR (TYPE A & B)
INFLAPCR: NEGATIVE
Influenza B By PCR: NEGATIVE

## 2017-07-19 LAB — BASIC METABOLIC PANEL
ANION GAP: 9 (ref 5–15)
BUN: 5 mg/dL — AB (ref 6–20)
CO2: 30 mmol/L (ref 22–32)
Calcium: 8.8 mg/dL — ABNORMAL LOW (ref 8.9–10.3)
Chloride: 99 mmol/L — ABNORMAL LOW (ref 101–111)
Creatinine, Ser: 0.69 mg/dL (ref 0.44–1.00)
GFR calc Af Amer: >60 mL/min (ref >60)
GLUCOSE: 127 mg/dL — AB (ref 65–99)
Potassium: 3.9 mmol/L (ref 3.5–5.1)
Sodium: 138 mmol/L (ref 135–145)

## 2017-07-19 LAB — GLUCOSE, CAPILLARY
GLUCOSE-CAPILLARY: 187 mg/dL — AB (ref 65–99)
GLUCOSE-CAPILLARY: 301 mg/dL — AB (ref 65–99)
Glucose-Capillary: 271 mg/dL — ABNORMAL HIGH (ref 65–99)

## 2017-07-19 LAB — TROPONIN I

## 2017-07-19 LAB — HEMOGLOBIN A1C
Hgb A1c MFr Bld: 8.1 % — ABNORMAL HIGH (ref 4.8–5.6)
Mean Plasma Glucose: 185.77 mg/dL

## 2017-07-19 LAB — PROCALCITONIN

## 2017-07-19 MED ORDER — HYDROCOD POLST-CPM POLST ER 10-8 MG/5ML PO SUER
5.0000 mL | Freq: Two times a day (BID) | ORAL | Status: DC | PRN
  Administered 2017-07-20: 5 mL via ORAL
  Filled 2017-07-19: qty 5

## 2017-07-19 MED ORDER — LEVOFLOXACIN IN D5W 750 MG/150ML IV SOLN
750.0000 mg | INTRAVENOUS | Status: DC
  Administered 2017-07-20: 750 mg via INTRAVENOUS
  Filled 2017-07-19 (×2): qty 150

## 2017-07-19 MED ORDER — LEVOFLOXACIN IN D5W 750 MG/150ML IV SOLN
750.0000 mg | Freq: Once | INTRAVENOUS | Status: AC
  Administered 2017-07-19: 750 mg via INTRAVENOUS
  Filled 2017-07-19: qty 150

## 2017-07-19 MED ORDER — POLYSACCHARIDE IRON COMPLEX 150 MG PO CAPS
150.0000 mg | ORAL_CAPSULE | Freq: Two times a day (BID) | ORAL | Status: DC
  Administered 2017-07-19 – 2017-07-20 (×2): 150 mg via ORAL
  Filled 2017-07-19 (×4): qty 1

## 2017-07-19 MED ORDER — OXYCODONE-ACETAMINOPHEN 7.5-325 MG PO TABS
1.0000 | ORAL_TABLET | Freq: Four times a day (QID) | ORAL | Status: DC | PRN
  Administered 2017-07-19: 1 via ORAL
  Administered 2017-07-19 – 2017-07-20 (×2): 2 via ORAL
  Filled 2017-07-19 (×5): qty 2

## 2017-07-19 MED ORDER — INSULIN GLARGINE 100 UNIT/ML ~~LOC~~ SOLN
15.0000 [IU] | Freq: Every day | SUBCUTANEOUS | Status: DC
  Administered 2017-07-19: 15 [IU] via SUBCUTANEOUS
  Filled 2017-07-19 (×3): qty 0.15

## 2017-07-19 MED ORDER — DILTIAZEM HCL ER COATED BEADS 180 MG PO CP24
180.0000 mg | ORAL_CAPSULE | Freq: Every day | ORAL | Status: DC
  Administered 2017-07-19: 180 mg via ORAL
  Filled 2017-07-19 (×2): qty 1

## 2017-07-19 MED ORDER — ACETAMINOPHEN 650 MG RE SUPP: 650.0000 mg | Freq: Four times a day (QID) | RECTAL | Status: DC | PRN

## 2017-07-19 MED ORDER — METHYLPREDNISOLONE SODIUM SUCC 125 MG IJ SOLR
125.0000 mg | Freq: Once | INTRAMUSCULAR | Status: AC
  Administered 2017-07-19: 125 mg via INTRAVENOUS
  Filled 2017-07-19: qty 2

## 2017-07-19 MED ORDER — ASPIRIN 81 MG PO CHEW
81.0000 mg | CHEWABLE_TABLET | Freq: Every day | ORAL | Status: DC
  Administered 2017-07-20: 81 mg via ORAL
  Filled 2017-07-19: qty 1

## 2017-07-19 MED ORDER — HYDROCHLOROTHIAZIDE 25 MG PO TABS
25.0000 mg | ORAL_TABLET | Freq: Every day | ORAL | Status: DC
  Administered 2017-07-20: 25 mg via ORAL
  Filled 2017-07-19: qty 1

## 2017-07-19 MED ORDER — INSULIN ASPART 100 UNIT/ML ~~LOC~~ SOLN
0.0000 [IU] | Freq: Every day | SUBCUTANEOUS | Status: DC
  Administered 2017-07-19: 3 [IU] via SUBCUTANEOUS
  Filled 2017-07-19: qty 1

## 2017-07-19 MED ORDER — VANCOMYCIN HCL IN DEXTROSE 1-5 GM/200ML-% IV SOLN: 1000.0000 mg | Freq: Once | INTRAVENOUS | Status: DC

## 2017-07-19 MED ORDER — ACETAMINOPHEN 325 MG PO TABS: 650.0000 mg | ORAL_TABLET | Freq: Four times a day (QID) | ORAL | Status: DC | PRN

## 2017-07-19 MED ORDER — ONDANSETRON HCL 4 MG/2ML IJ SOLN: 4.0000 mg | Freq: Four times a day (QID) | INTRAMUSCULAR | Status: DC | PRN

## 2017-07-19 MED ORDER — ENALAPRIL MALEATE 20 MG PO TABS
20.0000 mg | ORAL_TABLET | Freq: Two times a day (BID) | ORAL | Status: DC
  Administered 2017-07-19 – 2017-07-20 (×2): 20 mg via ORAL
  Filled 2017-07-19 (×4): qty 1

## 2017-07-19 MED ORDER — ENOXAPARIN SODIUM 40 MG/0.4ML ~~LOC~~ SOLN
40.0000 mg | SUBCUTANEOUS | Status: DC
  Filled 2017-07-19: qty 0.4

## 2017-07-19 MED ORDER — IPRATROPIUM-ALBUTEROL 0.5-2.5 (3) MG/3ML IN SOLN
3.0000 mL | Freq: Four times a day (QID) | RESPIRATORY_TRACT | Status: DC
  Administered 2017-07-19 – 2017-07-20 (×3): 3 mL via RESPIRATORY_TRACT
  Filled 2017-07-19 (×3): qty 3

## 2017-07-19 MED ORDER — INSULIN ASPART 100 UNIT/ML ~~LOC~~ SOLN
0.0000 [IU] | Freq: Three times a day (TID) | SUBCUTANEOUS | Status: DC
  Administered 2017-07-19: 7 [IU] via SUBCUTANEOUS
  Administered 2017-07-20 (×2): 5 [IU] via SUBCUTANEOUS
  Filled 2017-07-19 (×4): qty 1

## 2017-07-19 MED ORDER — PANTOPRAZOLE SODIUM 40 MG PO TBEC
40.0000 mg | DELAYED_RELEASE_TABLET | Freq: Every day | ORAL | Status: DC
  Administered 2017-07-19 – 2017-07-20 (×2): 40 mg via ORAL
  Filled 2017-07-19 (×3): qty 1

## 2017-07-19 MED ORDER — GABAPENTIN 100 MG PO CAPS
100.0000 mg | ORAL_CAPSULE | Freq: Three times a day (TID) | ORAL | Status: DC | PRN
  Filled 2017-07-19: qty 1

## 2017-07-19 MED ORDER — TRIAMCINOLONE ACETONIDE 0.1 % EX CREA: 1.0000 "application " | TOPICAL_CREAM | Freq: Two times a day (BID) | CUTANEOUS | Status: DC | PRN

## 2017-07-19 MED ORDER — DICYCLOMINE HCL 10 MG PO CAPS: 10.0000 mg | ORAL_CAPSULE | Freq: Three times a day (TID) | ORAL | Status: DC | PRN

## 2017-07-19 MED ORDER — ONDANSETRON HCL 4 MG PO TABS: 4.0000 mg | ORAL_TABLET | Freq: Four times a day (QID) | ORAL | Status: DC | PRN

## 2017-07-19 MED ORDER — BENZONATATE 100 MG PO CAPS
100.0000 mg | ORAL_CAPSULE | Freq: Three times a day (TID) | ORAL | Status: DC | PRN
  Filled 2017-07-19: qty 1

## 2017-07-19 MED ORDER — DILTIAZEM HCL ER COATED BEADS 180 MG PO CP24: 180.0000 mg | ORAL_CAPSULE | Freq: Every day | ORAL | Status: DC

## 2017-07-19 MED ORDER — METHYLPREDNISOLONE SODIUM SUCC 40 MG IJ SOLR
40.0000 mg | Freq: Every day | INTRAMUSCULAR | Status: DC
  Administered 2017-07-20: 40 mg via INTRAVENOUS
  Filled 2017-07-19: qty 1

## 2017-07-19 MED ORDER — BUDESONIDE 0.5 MG/2ML IN SUSP
0.5000 mg | Freq: Two times a day (BID) | RESPIRATORY_TRACT | Status: DC
  Administered 2017-07-19 – 2017-07-20 (×2): 0.5 mg via RESPIRATORY_TRACT
  Filled 2017-07-19 (×3): qty 2

## 2017-07-19 MED ORDER — IPRATROPIUM-ALBUTEROL 0.5-2.5 (3) MG/3ML IN SOLN
9.0000 mL | Freq: Once | RESPIRATORY_TRACT | Status: AC
  Administered 2017-07-19: 9 mL via RESPIRATORY_TRACT
  Filled 2017-07-19: qty 9

## 2017-07-19 NOTE — Progress Notes (Signed)
Pharmacy Antibiotic Note  Deanna Gay is a 62 y.o. female admitted on 07/19/2017 with pneumonia.  Pharmacy has been consulted for [facility] dosing.  Plan: Levaquin 727m iv q24h   Height: 5' 4"  (162.6 cm) Weight: 204 lb (92.5 kg) IBW/kg (Calculated) : 54.7  Temp (24hrs), Avg:98.2 F (36.8 C), Min:98.2 F (36.8 C), Max:98.2 F (36.8 C)  Recent Labs  Lab 07/19/17 1121  WBC 6.3  CREATININE 0.69    Estimated Creatinine Clearance: 81.4 mL/min (by C-G formula based on SCr of 0.69 mg/dL).    Allergies  Allergen Reactions  . Iodine Swelling  . Penicillins Swelling    Has patient had a PCN reaction causing immediate rash, facial/tongue/throat swelling, SOB or lightheadedness with hypotension: Yes Has patient had a PCN reaction causing severe rash involving mucus membranes or skin necrosis: Yes Has patient had a PCN reaction that required hospitalization: No Has patient had a PCN reaction occurring within the last 10 years: No If all of the above answers are "NO", then may proceed with Cephalosporin use.     Antimicrobials this admission: Anti-infectives (From admission, onward)   Start     Dose/Rate Route Frequency Ordered Stop   07/20/17 1300  levofloxacin (LEVAQUIN) IVPB 750 mg     750 mg 100 mL/hr over 90 Minutes Intravenous Every 24 hours 07/19/17 1331     07/19/17 1330  levofloxacin (LEVAQUIN) IVPB 750 mg     750 mg 100 mL/hr over 90 Minutes Intravenous  Once 07/19/17 1319     07/19/17 1330  vancomycin (VANCOCIN) IVPB 1000 mg/200 mL premix     1,000 mg 200 mL/hr over 60 Minutes Intravenous  Once 07/19/17 1319         Microbiology results: No results found for this or any previous visit (from the past 240 hour(s)).   Thank you for allowing pharmacy to be a part of this patient's care.  GDonna ChristenCoffee 07/19/2017 1:32 PM

## 2017-07-19 NOTE — ED Notes (Signed)
Pt up to the bathroom. Urine sample in room if needed. Pt in NAD at this time and is able to ambulate independently at this time no dizziness or lightheadedness reported.

## 2017-07-19 NOTE — ED Provider Notes (Signed)
[facility] Emergency Department Provider Note  ____________________________________________   First MD Initiated Contact with Patient 07/19/17 1209     (approximate)  I have reviewed the triage vital signs and the nursing notes.   HISTORY  Chief Complaint COPD  HPI Deanna Gay is a 62 y.o. female with a history of COPD as well as schizophrenia and hypertension who is presenting to the emergency department with 1 month of worsening shortness of breath.  Patient says that she smokes about 5 cigarettes/day.  Denies any chest pain.  Denies any fever, sore throat.  Says that the shortness of breath became too severe and this is why she presented to the emergency department today.  Does not report any fever, night sweats or hemoptysis.  Past Medical History:  Diagnosis Date  . Arthritis   . Cancer ([place])    cervical  . COPD (chronic obstructive pulmonary disease) ([place])   . Depression   . Diabetes mellitus without complication ([place])   . GERD (gastroesophageal reflux disease)   . Hypertension   . Schizophrenia ([place])   . Sleep apnea     Patient Active Problem List   Diagnosis Date Noted  . PMB (postmenopausal bleeding) 12/04/2016  . Bilateral carpal tunnel syndrome 10/14/2016  . Healthcare maintenance 08/18/2016  . Unilateral primary osteoarthritis, left hip 05/16/2016  . Status post left hip replacement 05/16/2016  . Iron deficiency anemia 02/27/2015  . Aortic atherosclerosis ([place]) 03/04/2014  . Sleep apnea 10/31/2013  . Hyperlipidemia, unspecified 09/23/2013  . 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 Hypertension, essential 08/27/2013  . COPD (chronic obstructive pulmonary disease) ([place]) 08/27/2013  . Chronic undifferentiated schizophrenia ([place]) 08/27/2013    Past Surgical History:  Procedure Laterality Date  . APPENDECTOMY    .  COLONOSCOPY WITH PROPOFOL N/A 12/25/2014   Procedure: COLONOSCOPY WITH PROPOFOL;  Surgeon: Josefine Class, MD;  Location: [facility] ENDOSCOPY;  Service: Endoscopy;  Laterality: N/A;  . ESOPHAGOGASTRODUODENOSCOPY (EGD) WITH PROPOFOL N/A 12/25/2014   Procedure: ESOPHAGOGASTRODUODENOSCOPY (EGD) WITH PROPOFOL;  Surgeon: Josefine Class, MD;  Location[facility] END[facility]COPY;  Service: Endoscopy;  Laterality: N/A;  . JOINT REPLACEMENT     right hip  . TOTAL HIP ARTHROPLASTY Left 05/16/2016   Procedure: LEFT TOTAL HIP ARTHROPLASTY ANTERIOR APPROACH;  Surgeon: Mcarthur Rossetti, MD;  Location: [facility] ORS;  Service: Orthopedics;  Laterality: Left;    Prior to Admission medications   Medication Sig Start Date End Date Taking? Authorizing Provider  diltiazem (CARDIZEM CD) 180 MG 24 hr capsule Take 180 mg by mouth daily.   Yes [provider]  enalapril (VASOTEC) 20 MG tablet take 1 tablet by mouth twice a day 03/06/15  Yes [provider]  hydrochlorothiazide (HYDRODIURIL) 25 MG tablet Take 25 mg by mouth daily.   Yes [provider]  insulin glargine (LANTUS) 100 UNIT/ML injection Inject 15 Units into the skin at bedtime.   Yes [provider]  metFORMIN (GLUCOPHAGE) 500 MG tablet Take 500 mg by mouth 2 (two) times daily with a meal.  08/18/16 08/18/17 Yes [provider]  omeprazole (PRILOSEC) 20 MG capsule Take 20 mg by mouth 2 (two) times daily before a meal.  03/29/16  Yes [provider]  oxyCODONE-acetaminophen (PERCOCET) 7.5-325 MG tablet Take 1-2 tablets by mouth every 6 (six) hours as needed for severe pain. 05/18/16  Yes Mcarthur Rossetti, MD  albuterol (  PROVENTIL HFA;VENTOLIN HFA) 108 (90 BASE) MCG/ACT inhaler Inhale 2 puffs into the lungs every 4 (four) hours as needed for wheezing or shortness of breath.     [provider]  aspirin 81 MG chewable tablet Chew 1 tablet (81 mg total) by mouth 2 (two) times daily. 05/18/16   Mcarthur Rossetti, MD  budesonide-formoterol [facility]) 160-4.5 MCG/ACT inhaler Inhale 2 puffs into the lungs 2 (two) times daily.    [provider]  buPROPion (WELLBUTRIN) 75 MG tablet Take 75 mg by mouth 2 (two) times daily.    [provider]  cyclobenzaprine (FLEXERIL) 10 MG tablet Take 1 tablet (10 mg total) by mouth 3 (three) times daily as needed for muscle spasms. 05/18/16   Mcarthur Rossetti, MD  dicyclomine (BENTYL) 10 MG capsule Take 10 mg by mouth 3 (three) times daily as needed for spasms.    [provider]  diphenhydrAMINE (BENADRYL) 50 MG tablet Take one tablet one hour prior to CTU 11/14/16   McGowan, Larene Beach A, PA-C  gabapentin (NEURONTIN) 100 MG capsule Take 1 capsule (100 mg total) by mouth 3 (three) times daily. 05/20/16   Mcarthur Rossetti, MD  hyoscyamine (LEVBID) 0.375 MG 12 hr tablet Take by mouth. 10/17/16 01/15/17  [provider]  ipratropium-albuterol (DUONEB) 0.5-2.5 (3) MG/3ML SOLN Take 3 mLs by nebulization every 6 (six) hours as needed.    [provider]  iron polysaccharides (IFEREX 150) 150 MG capsule Take 1 capsule (150 mg total) by mouth 2 (two) times daily. 06/23/17   Lloyd Huger, MD  losartan (COZAAR) 50 MG tablet Take by mouth. 11/11/16 11/11/17  [provider]  oxybutynin (DITROPAN-XL) 10 MG 24 hr tablet Take 10 mg by mouth at bedtime.    [provider]  predniSONE (DELTASONE) 50 MG tablet Take the one tablet 13 hours, 7 hours and 1 hours prior to the CT Urogram 11/14/16   McGowan, Larene Beach A, PA-C  ranitidine (ZANTAC) 150 MG tablet Take one tablet 1 hour prior to CT Urogram 11/14/16   McGowan, Larene Beach A, PA-C  triamcinolone (KENALOG) 0.1 % paste Use as directed 1 application in the mouth or throat daily. 02/23/15   [provider]  triamcinolone cream (KENALOG) 0.1 % Apply 1 application topically 2 (two) times daily.    [provider]    Allergies Iodine and  Penicillins  Family History  Problem Relation Age of Onset  . Hypertension Mother   . Diabetes Mellitus II Mother   . Breast cancer Neg Hx   . Kidney cancer Neg Hx   . Bladder Cancer Neg Hx     Social History Social History   Tobacco Use  . Smoking status: Current Every Day Smoker    Packs/day: 1.00    Years: 47.00    Pack years: 47.00  . Smokeless tobacco: Never Used  Substance Use Topics  . Alcohol use: No  . Drug use: No    Review of Systems  Constitutional: No fever/chills Eyes: No visual changes. ENT: No sore throat. Cardiovascular: Denies chest pain. Respiratory: As above Gastrointestinal: No abdominal pain.  No nausea, no vomiting.  No diarrhea.  No constipation. Genitourinary: Negative for dysuria. Musculoskeletal: Negative for back pain. Skin: Negative for rash. Neurological: Negative for headaches, focal weakness or numbness.   ____________________________________________   PHYSICAL EXAM:  VITAL SIGNS: ED Triage Vitals  Enc Vitals Group     BP 07/19/17 1119 124/68     Pulse Rate 07/19/17 1119 (!)  105     Resp 07/19/17 1119 20     Temp 07/19/17 1119 98.2 F (36.8 C)     Temp Source 07/19/17 1119 Oral     SpO2 07/19/17 1119 93 %     Weight 07/19/17 1120 204 lb (92.5 kg)     Height 07/19/17 1120 5' 4"  (1.626 m)     Head Circumference --      Peak Flow --      Pain Score 07/19/17 1124 7     Pain Loc --      Pain Edu? --      Excl. in [place]? --     Constitutional: Alert and oriented. Well appearing and in no acute distress. Eyes: Conjunctivae are normal.  Head: Atraumatic. Nose: No congestion/rhinnorhea. Mouth/Throat: Mucous membranes are moist.  Neck: No stridor.   Cardiovascular: Normal rate, regular rhythm. Grossly normal heart sounds.  Respiratory: Tachypneic with prolonged respiratory phase and diffuse wheezes with expiratory cough.  Speaking in full sentences.   Gastrointestinal: Soft and nontender. No distention. No CVA  tenderness. Musculoskeletal: No lower extremity tenderness nor edema.  No joint effusions. Neurologic:  Normal speech and language. No gross focal neurologic deficits are appreciated. Skin:  Skin is warm, dry and intact. No rash noted. Psychiatric: Mood and affect are normal. Speech and behavior are normal.  ____________________________________________   LABS (all labs ordered are listed, but only abnormal results are displayed)  Labs Reviewed  BASIC METABOLIC PANEL - Abnormal; Notable for the following components:      Result Value   Chloride 99 (*)    Glucose, Bld 127 (*)    BUN 5 (*)    Calcium 8.8 (*)    All other components within normal limits  CBC - Abnormal; Notable for the following components:   Hemoglobin 11.9 (*)    RDW 15.2 (*)    All other components within normal limits  TROPONIN I   ____________________________________________  EKG  ED ECG REPORT I, Doran Stabler, the attending physician, personally viewed and interpreted this ECG.   Date: 07/19/2017  EKG Time: 1123  Rate: 106  Rhythm: sinus tachycardia  Axis: Normal  Intervals:none  ST&T Change: No ST segment elevation or depression.  No abnormal T wave inversion.  ____________________________________________  RADIOLOGY  Diffuse peribronchial cuffing and patchy ill-defined opacities which likely reflects indolent atypical infections versus Mycobacterium avium. ____________________________________________   PROCEDURES  Procedure(s) performed:   Procedures  Critical Care performed:   ____________________________________________   INITIAL IMPRESSION / ASSESSMENT AND PLAN / ED COURSE  Pertinent labs & imaging results that were available during my care of the patient were reviewed by me and considered in my medical decision making (see chart for details).  Differential includes, but is not limited to, viral syndrome, bronchitis including COPD exacerbation, pneumonia, reactive airway  disease including asthma, CHF including exacerbation with or without pulmonary/interstitial edema, pneumothorax, ACS, thoracic trauma, and pulmonary embolism. As part of my medical decision making, I reviewed the following data within the [facility] chart reviewed  ----------------------------------------- 1:04 PM on 07/19/2017 -----------------------------------------  On review of the pulmonary note from June 26, 2017 it was reported that the patient received a Medrol Dosepak as well as a Z-Pak.    ----------------------------------------- 1:30 PM on 07/19/2017 -----------------------------------------  Patient despite nebs and steroids is still wheezing.  Prolonged expiratory phase and tachypneic.  Also found to have possible MAC on chest x-ray.  CT from several days ago showed left  lower lobe pneumonia.  Discussed this with Dr. Callie Fielding of the ICU who says to treat the patient for community-acquired pneumonia that he will see her as a consultation and that she may require bronchoscopy.  Patient aware of need for admission to the hospital.  Signed out to Dr. Earleen Newport.  ____________________________________________   FINAL CLINICAL IMPRESSION(S) / ED DIAGNOSES  COPD.  Community acquired pneumonia.    NEW MEDICATIONS STARTED DURING THIS VISIT:  New Prescriptions   No medications on file     Note:  This document was prepared using Dragon voice recognition software and may include unintentional dictation errors.     Orbie Pyo, MD 07/19/17 [phone]

## 2017-07-19 NOTE — ED Notes (Signed)
Attempted to call report to floor. RN unable to take report at this time but floor reports RN will call the ED back as soon as possible.

## 2017-07-19 NOTE — ED Triage Notes (Signed)
Pt comes into the ED via POV c/o shortness of breath.  Patient states this has been ongoing for over a month.  Patient has h/o COPD and emphysema.  Patient unlabored at this time and in NAD.  Patient still smoking currently and states she always has a cough and that is unchanged.  Patient alert and oriented x4.

## 2017-07-19 NOTE — H&P (Signed)
[place] [facility] [place] NAME: Deanna Gay    MR#:  [id number]  DATE OF BIRTH:  1956-04-23  DATE OF ADMISSION:  07/19/2017  PRIMARY CARE PHYSICIAN: Kirk Ruths, MD   REQUESTING/REFERRING PHYSICIAN: Dr Larae Grooms  CHIEF COMPLAINT:   Chief Complaint  Patient presents with  . COPD    HISTORY OF PRESENT ILLNESS:  Deanna Gay  is a 62 y.o. female with a known history of COPD presents with shortness of breath going on for 3 weeks.  She had a Z-Pak as outpatient with Dr. Raul Del after a CT scan.  She states that she has been unable to breathe for the last 3 weeks she has been coughing mostly dry.  With the coughing she feels her whole body goes limp and has to sit down or she will fall.  She has been having shaking episodes after the coughing.  She has been wheezing.  In the ER, chest x-ray shows diffuse peribronchial cuffing and patchy ill-defined opacities which correspond to the findings on CT scan of the chest.  This could be a chronic indolent atypical infection such as MAI.  ER physician spoke with Dr. Mortimer Fries covering for Dr. Raul Del.  PAST MEDICAL HISTORY:   Past Medical History:  Diagnosis Date  . Arthritis   . Cancer ([place])    cervical  . COPD (chronic obstructive pulmonary disease) ([place])   . Depression   . Diabetes mellitus without complication ([place])   . GERD (gastroesophageal reflux disease)   . Hypertension   . Schizophrenia ([place])   . Sleep apnea     PAST SURGICAL HISTORY:   Past Surgical History:  Procedure Laterality Date  . APPENDECTOMY    . COLONOSCOPY WITH PROPOFOL N/A 12/25/2014   Procedure: COLONOSCOPY WITH PROPOFOL;  Surgeon: Josefine Class, MD;  Location: [facility] ENDOSCOPY;  Service: Endoscopy;  Laterality: N/A;  . ESOPHAGOGASTRODUODENOSCOPY (EGD) WITH PROPOFOL N/A 12/25/2014   Procedure: ESOPHAGOGASTRODUODENOSCOPY (EGD) WITH PROPOFOL;  Surgeon: Josefine Class, MD;  Location: [facility]  ENDOSCOPY;  Service: Endoscopy;  Laterality: N/A;  . JOINT REPLACEMENT     right hip  . TOTAL HIP ARTHROPLASTY Left 05/16/2016   Procedure: LEFT TOTAL HIP ARTHROPLASTY ANTERIOR APPROACH;  Surgeon: Mcarthur Rossetti, MD;  Location: [facility] ORS;  Service: Orthopedics;  Laterality: Left;    SOCIAL HISTORY:   Social History   Tobacco Use  . Smoking status: Current Every Day Smoker    Packs/day: 1.00    Years: 47.00    Pack years: 47.00  . Smokeless tobacco: Never Used  Substance Use Topics  . Alcohol use: No    FAMILY HISTORY:   Family History  Problem Relation Age of Onset  . Hypertension Mother   . Diabetes Mellitus II Mother   . Breast cancer Neg Hx   . Kidney cancer Neg Hx   . Bladder Cancer Neg Hx     DRUG ALLERGIES:   Allergies  Allergen Reactions  . Iodine Swelling  . Penicillins Swelling    Has patient had a PCN reaction causing immediate rash, facial/tongue/throat swelling, SOB or lightheadedness with hypotension: Yes Has patient had a PCN reaction causing severe rash involving mucus membranes or skin necrosis: Yes Has patient had a PCN reaction that required hospitalization: No Has patient had a PCN reaction occurring within the last 10 years: No If all of the above answers are "NO", then may proceed with Cephalosporin use.     REVIEW OF SYSTEMS:  CONSTITUTIONAL: No fever, chills or sweats.  Positive for fatigue.  EYES: Poor vision whether she wears glasses or not EARS, NOSE, AND THROAT: No tinnitus or ear pain.  Cannot hear well out of the left ear.  Positive runny nose in the morning.  No sore throat RESPIRATORY: Positive for cough, shortness of breath, and wheezing no.  hemoptysis.  CARDIOVASCULAR: No chest pain, orthopnea, edema.  GASTROINTESTINAL:  positive for nausea and some diarrhea.  No vomiting or abdominal pain. No blood in bowel movements GENITOURINARY: No dysuria.  Positive for hematuria.  ENDOCRINE: No polyuria, nocturia,  HEMATOLOGY: History  of anemia.  No easy bruising or bleeding SKIN: No rash or lesion. MUSCULOSKELETAL: Chronic back pain NEUROLOGIC: No tingling, numbness, weakness.  PSYCHIATRY: No anxiety or depression.   MEDICATIONS AT HOME:   Prior to Admission medications   Medication Sig Start Date End Date Taking? Authorizing Provider  albuterol (PROVENTIL HFA;VENTOLIN HFA) 108 (90 BASE) MCG/ACT inhaler Inhale 2 puffs into the lungs every 4 (four) hours as needed for wheezing or shortness of breath.    Yes [provider]  aspirin 81 MG chewable tablet Chew 1 tablet (81 mg total) by mouth 2 (two) times daily. 05/18/16  Yes Mcarthur Rossetti, MD  budesonide-formoterol [facility]) 160-4.5 MCG/ACT inhaler Inhale 2 puffs into the lungs 2 (two) times daily.   Yes [provider]  dicyclomine (BENTYL) 10 MG capsule Take 10 mg by mouth 3 (three) times daily as needed for spasms.   Yes [provider]  diltiazem (CARDIZEM CD) 180 MG 24 hr capsule Take 180 mg by mouth daily.   Yes [provider]  enalapril (VASOTEC) 20 MG tablet take 1 tablet by mouth twice a day 03/06/15  Yes [provider]  hydrochlorothiazide (HYDRODIURIL) 25 MG tablet Take 25 mg by mouth daily.   Yes [provider]  insulin glargine (LANTUS) 100 UNIT/ML injection Inject 15 Units into the skin at bedtime.   Yes [provider]  iron polysaccharides (IFEREX 150) 150 MG capsule Take 1 capsule (150 mg total) by mouth 2 (two) times daily. 06/23/17  Yes Lloyd Huger, MD  metFORMIN (GLUCOPHAGE) 500 MG tablet Take 500 mg by mouth 2 (two) times daily with a meal.  08/18/16 08/18/17 Yes [provider]  omeprazole (PRILOSEC) 20 MG capsule Take 20 mg by mouth 2 (two) times daily before a meal.  03/29/16  Yes [provider]  oxyCODONE-acetaminophen (PERCOCET) 7.5-325 MG tablet Take 1-2 tablets by mouth every 6 (six) hours as needed for severe pain. 05/18/16  Yes Mcarthur Rossetti, MD  triamcinolone (KENALOG) 0.1 % paste Use as directed 1 application in the mouth or throat daily. 02/23/15  Yes [provider]  triamcinolone cream (KENALOG) 0.1 % Apply 1 application topically 2 (two) times daily.   Yes [provider]  gabapentin (NEURONTIN) 100 MG capsule Take 1 capsule (100 mg total) by mouth 3 (three) times daily. 05/20/16   Mcarthur Rossetti, MD  ipratropium-albuterol (DUONEB) 0.5-2.5 (3) MG/3ML SOLN Take 3 mLs by nebulization every 6 (six) hours as needed.    [provider]      VITAL SIGNS:  Blood pressure 124/68, pulse (!) 105, temperature 98.2 F (36.8 C), temperature source Oral, resp. rate 20, height 5' 4"  (1.626 m), weight 92.5 kg (204 lb), SpO2 93 %.  PHYSICAL EXAMINATION:  GENERAL:  62 y.o.-year-old patient lying in the bed with no acute distress.  EYES: Pupils equal, round, reactive to  light and accommodation. No scleral icterus. Extraocular muscles intact.  HEENT: Head atraumatic, normocephalic. Oropharynx and nasopharynx clear.  NECK:  Supple, no jugular venous distention. No thyroid enlargement, no tenderness.  LUNGS: Decreased breath sounds bilaterally,  positive wheezing throughout entire lung field.  No rales,rhonchi or crepitation. No use of accessory muscles of respiration.  CARDIOVASCULAR: S1, S2 normal. No murmurs, rubs, or gallops.  ABDOMEN: Soft, nontender, nondistended. Bowel sounds present. No organomegaly or mass.  EXTREMITIES: No pedal edema, cyanosis, or clubbing.  NEUROLOGIC: Cranial nerves II through XII are intact. Muscle strength 5/5 in all extremities. Sensation intact. Gait not checked.  PSYCHIATRIC: The patient is alert and oriented x 3.  SKIN: No rash, lesion, or ulcer.   LABORATORY PANEL:   CBC Recent Labs  Lab 07/19/17 1121  WBC 6.3  HGB 11.9*  HCT 36.8  PLT 264    ------------------------------------------------------------------------------------------------------------------  Chemistries  Recent Labs  Lab 07/19/17 1121  NA 138  K 3.9  CL 99*  CO2 30  GLUCOSE 127*  BUN 5*  CREATININE 0.69  CALCIUM 8.8*   ------------------------------------------------------------------------------------------------------------------  Cardiac Enzymes Recent Labs  Lab 07/19/17 1121  TROPONINI <0.03   ------------------------------------------------------------------------------------------------------------------  RADIOLOGY:  Dg Chest 2 View  Result Date: 07/19/2017 CLINICAL DATA:  62 year old female with history of shortness of breath. EXAM: CHEST  2 VIEW COMPARISON:  Chest CT 07/15/2017. FINDINGS: Diffuse peribronchial cuffing. Patchy ill-defined opacities in the lungs bilaterally, corresponding to areas of peribronchovascular nodularity noted on recent chest CT. Linear area of subsegmental atelectasis in the left mid lung. No confluent consolidative airspace disease. No pleural effusions. No evidence of pulmonary edema. Heart size is normal. Upper mediastinal contours are within normal limits. Aortic atherosclerosis. IMPRESSION: 1. 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 2. Aortic atherosclerosis. Electronically Signed   By: Vinnie Langton M.D.   On: 07/19/2017 12:25    EKG:   Sinus tachycardia 106 bpm.  No acute ST-T wave changes.  IMPRESSION AND PLAN:   1.  COPD exacerbation.  IV Solu-Medrol, budesonide and DuoNeb nebulizer solution.  Patient must move better air prior to disposition.  Listen to lungs on a daily basis for evaluation. 2.  Possibility of pneumonia versus chronic infection.  ER physician spoke with Dr. Mortimer Fries  covering for Dr. Raul Del.  Continue  Levaquin for now.  Send off pro-calcitonin. 3.  Essential hypertension.  Blood pressure when I saw her was 142/82.  Continue usual medications. 4.  Type 2 diabetes mellitus, controlled.  Hold Glucophage for right now.  Continue insulin and sliding scale.  Check a hemoglobin A1c.  Sugars will likely be higher with steroids. 5.  Sleep apnea on CPAP at night with oxygen 6.  Chronic pain continue usual pain medications as needed 7.  Tobacco abuse.  Smoking cessation counseling done 4 minutes by me.  Nicotine patch ordered.  Patient states that she cut back from 2 packs/day a couple weeks ago to 5 cigarettes a day.  I have personally reviewed the EKG, chest x-ray and laboratory data.  Management plans discussed with the patient, family and they are in agreement.  CODE STATUS: Full code  TOTAL TIME TAKING CARE OF THIS PATIENT: 50 minutes.    Loletha Grayer M.D on 07/19/2017 at 1:54 PM  Between 7am to 6pm - Pager - [phone]  After 6pm call admission pager [phone]  [facility]  Office  [phone]  CC: Primary care physician; Kirk Ruths, MD

## 2017-07-20 DIAGNOSIS — J441 Chronic obstructive pulmonary disease with (acute) exacerbation: Secondary | ICD-10-CM | POA: Diagnosis not present

## 2017-07-20 LAB — GLUCOSE, CAPILLARY
GLUCOSE-CAPILLARY: 255 mg/dL — AB (ref 65–99)
Glucose-Capillary: 295 mg/dL — ABNORMAL HIGH (ref 65–99)

## 2017-07-20 LAB — RESPIRATORY PANEL BY PCR
Adenovirus: NOT DETECTED
BORDETELLA PERTUSSIS-RVPCR: NOT DETECTED
CORONAVIRUS OC43-RVPPCR: NOT DETECTED
Chlamydophila pneumoniae: NOT DETECTED
Coronavirus 229E: NOT DETECTED
Coronavirus HKU1: NOT DETECTED
Coronavirus NL63: NOT DETECTED
INFLUENZA B-RVPPCR: NOT DETECTED
Influenza A: NOT DETECTED
METAPNEUMOVIRUS-RVPPCR: NOT DETECTED
Mycoplasma pneumoniae: NOT DETECTED
PARAINFLUENZA VIRUS 1-RVPPCR: NOT DETECTED
PARAINFLUENZA VIRUS 2-RVPPCR: NOT DETECTED
PARAINFLUENZA VIRUS 3-RVPPCR: NOT DETECTED
Parainfluenza Virus 4: NOT DETECTED
RESPIRATORY SYNCYTIAL VIRUS-RVPPCR: NOT DETECTED
Rhinovirus / Enterovirus: NOT DETECTED

## 2017-07-20 LAB — BASIC METABOLIC PANEL
Anion gap: 10 (ref 5–15)
BUN: 12 mg/dL (ref 6–20)
CO2: 29 mmol/L (ref 22–32)
Calcium: 8.2 mg/dL — ABNORMAL LOW (ref 8.9–10.3)
Chloride: 94 mmol/L — ABNORMAL LOW (ref 101–111)
Creatinine, Ser: 0.78 mg/dL (ref 0.44–1.00)
GFR calc Af Amer: >60 mL/min (ref >60)
GFR calc non Af Amer: >60 mL/min (ref >60)
GLUCOSE: 279 mg/dL — AB (ref 65–99)
POTASSIUM: 4.8 mmol/L (ref 3.5–5.1)
Sodium: 133 mmol/L — ABNORMAL LOW (ref 135–145)

## 2017-07-20 LAB — CBC
HCT: 35 % (ref 35.0–47.0)
Hemoglobin: 11.4 g/dL — ABNORMAL LOW (ref 12.0–16.0)
MCH: 27.8 pg (ref 26.0–34.0)
MCHC: 32.6 g/dL (ref 32.0–36.0)
MCV: 85.2 fL (ref 80.0–100.0)
Platelets: 254 10*3/uL (ref 150–440)
RBC: 4.1 MIL/uL (ref 3.80–5.20)
RDW: 14.8 % — ABNORMAL HIGH (ref 11.5–14.5)
WBC: 7.1 10*3/uL (ref 3.6–11.0)

## 2017-07-20 LAB — PROCALCITONIN

## 2017-07-20 MED ORDER — DOXYCYCLINE HYCLATE 100 MG PO CAPS: 100.0000 mg | ORAL_CAPSULE | Freq: Two times a day (BID) | ORAL | 0 refills | Status: AC

## 2017-07-20 MED ORDER — NICOTINE 21 MG/24HR TD PT24
21.0000 mg | MEDICATED_PATCH | Freq: Every day | TRANSDERMAL | Status: DC
Start: 2017-07-20 — End: 2017-07-20
  Administered 2017-07-20: 21 mg via TRANSDERMAL
  Filled 2017-07-20 (×2): qty 1

## 2017-07-20 MED ORDER — PREDNISONE 10 MG PO TABS: 10.0000 mg | ORAL_TABLET | Freq: Every day | ORAL | 0 refills | Status: DC

## 2017-07-20 MED ORDER — OXYCODONE-ACETAMINOPHEN 7.5-325 MG PO TABS
2.0000 | ORAL_TABLET | Freq: Once | ORAL | Status: AC
Start: 2017-07-20 — End: 2017-07-20
  Administered 2017-07-20: 2 via ORAL

## 2017-07-20 MED ORDER — METHYLPREDNISOLONE SODIUM SUCC 125 MG IJ SOLR
60.0000 mg | Freq: Once | INTRAMUSCULAR | Status: AC
Start: 2017-07-20 — End: 2017-07-20
  Administered 2017-07-20: 60 mg via INTRAVENOUS
  Filled 2017-07-20: qty 2

## 2017-07-20 MED ORDER — INSULIN LISPRO 100 UNIT/ML ~~LOC~~ SOLN: SUBCUTANEOUS | 11 refills | Status: AC

## 2017-07-20 MED ORDER — KETOROLAC TROMETHAMINE 30 MG/ML IJ SOLN: 30.0000 mg | Freq: Four times a day (QID) | INTRAMUSCULAR | Status: DC | PRN

## 2017-07-20 MED ORDER — GUAIFENESIN-DM 100-10 MG/5ML PO SYRP: 5.0000 mL | ORAL_SOLUTION | ORAL | 0 refills | Status: DC | PRN

## 2017-07-20 MED ORDER — BENZONATATE 200 MG PO CAPS: 200.0000 mg | ORAL_CAPSULE | Freq: Three times a day (TID) | ORAL | 0 refills | Status: DC

## 2017-07-20 NOTE — Progress Notes (Signed)
Inpatient Diabetes Program Recommendations  AACE/ADA: New Consensus Statement on Inpatient Glycemic Control (2015)  Target Ranges:  Prepandial:   less than 140 mg/dL      Peak postprandial:   less than 180 mg/dL (1-2 hours)      Critically ill patients:  140 - 180 mg/dL   Results for Deanna, Gay (MRN [id number]) as of 07/20/2017 08:24  Ref. Range 07/19/2017 14:41 07/19/2017 16:51 07/19/2017 21:14 07/20/2017 07:47  Glucose-Capillary Latest Ref Range: 65 - 99 mg/dL 187 (H) 301 (H) 271 (H) 255 (H)  Results for Deanna, Gay (MRN [id number]) as of 07/20/2017 08:24  Ref. Range 07/19/2017 11:21  Hemoglobin A1C Latest Ref Range: 4.8 - 5.6 % 8.1 (H)   Review of Glycemic Control  Diabetes history: DM2 Outpatient Diabetes medications: Lantus 15 units QHS, Metformin 500 mg BID Current orders for Inpatient glycemic control: Lantus 15 units QHS, Novolog 0-9 units TID with meals, Novolog 0-5 units QHS; Solumedrol 40 mg daily  Inpatient Diabetes Program Recommendations: Correction (SSI): Please consider increasing Novolog correction to moderate scale (0-15 units). Insulin - Meal Coverage: If steroids will be continued, please consider ordering Novolog 3 units TID with meals if patient eats at least 50% of meals. HgbA1C: A1C 8.1% on 07/19/17 indicating an average glucose of 186 mg/dl over the past 2-3 months.  Thanks, Barnie Alderman, RN, MSN, CDE Diabetes Coordinator Inpatient Diabetes Program [phone] (Team Pager from 8am to 5pm)

## 2017-07-20 NOTE — Progress Notes (Signed)
Deanna Gay to be D/C'd home  per MD order.  Discussed prescriptions and follow up appointments with the patient. Prescriptions given to patient, medication list explained in detail. Pt verbalized understanding.  Allergies as of 07/20/2017      Reactions   Iodine Swelling   Penicillins Swelling   Has patient had a PCN reaction causing immediate rash, facial/tongue/throat swelling, SOB or lightheadedness with hypotension: Yes Has patient had a PCN reaction causing severe rash involving mucus membranes or skin necrosis: Yes Has patient had a PCN reaction that required hospitalization: No Has patient had a PCN reaction occurring within the last 10 years: No If all of the above answers are "NO", then may proceed with Cephalosporin use.      Medication List    TAKE these medications   albuterol 108 (90 Base) MCG/ACT inhaler Commonly known as:  PROVENTIL HFA;VENTOLIN HFA Inhale 2 puffs into the lungs every 4 (four) hours as needed for wheezing or shortness of breath.   aspirin 81 MG chewable tablet Chew 1 tablet (81 mg total) by mouth 2 (two) times daily.   benzonatate 200 MG capsule Commonly known as:  TESSALON Take 1 capsule (200 mg total) by mouth 3 (three) times daily.   budesonide-formoterol 160-4.5 MCG/ACT inhaler Commonly known as:  SYMBICORT Inhale 2 puffs into the lungs 2 (two) times daily.   dicyclomine 10 MG capsule Commonly known as:  BENTYL Take 10 mg by mouth 3 (three) times daily as needed for spasms.   diltiazem 180 MG 24 hr capsule Commonly known as:  CARDIZEM CD Take 180 mg by mouth daily.   doxycycline 100 MG capsule Commonly known as:  VIBRAMYCIN Take 1 capsule (100 mg total) by mouth 2 (two) times daily for 10 days.   enalapril 20 MG tablet Commonly known as:  VASOTEC take 1 tablet by mouth twice a day   gabapentin 100 MG capsule Commonly known as:  NEURONTIN Take 1 capsule (100 mg total) by mouth 3 (three) times daily.    guaiFENesin-dextromethorphan 100-10 MG/5ML syrup Commonly known as:  ROBITUSSIN DM Take 5 mLs by mouth every 4 (four) hours as needed for cough.   hydrochlorothiazide 25 MG tablet Commonly known as:  HYDRODIURIL Take 25 mg by mouth daily.   insulin glargine 100 UNIT/ML injection Commonly known as:  LANTUS Inject 15 Units into the skin at bedtime.   insulin lispro 100 UNIT/ML injection Commonly known as:  HUMALOG Please check your sugars before each meal and use lispro per the below sliding scale:  For sugars 151-200: take 2 units For 201-250: take 4units For 251-300: take 6 units For 301-350: take 8 units For 351-400: take 10 units For > 401: call MD   ipratropium-albuterol 0.5-2.5 (3) MG/3ML Soln Commonly known as:  DUONEB Take 3 mLs by nebulization every 6 (six) hours as needed.   iron polysaccharides 150 MG capsule Commonly known as:  IFEREX 150 Take 1 capsule (150 mg total) by mouth 2 (two) times daily.   metFORMIN 500 MG tablet Commonly known as:  GLUCOPHAGE Take 500 mg by mouth 2 (two) times daily with a meal.   omeprazole 20 MG capsule Commonly known as:  PRILOSEC Take 20 mg by mouth 2 (two) times daily before a meal.   oxyCODONE-acetaminophen 7.5-325 MG tablet Commonly known as:  PERCOCET Take 1-2 tablets by mouth every 6 (six) hours as needed for severe pain.   predniSONE 10 MG tablet Commonly known as:  DELTASONE Take 1 tablet (10  mg total) by mouth daily. 6 tabs PO x 2 days 5 tabs PO x 2 days 4 tabs PO x 2 days 3 tabs PO x 2 days 2 tabs PO x 2 days 1 tab PO x 2 days and stop   triamcinolone 0.1 % paste Commonly known as:  KENALOG Use as directed 1 application in the mouth or throat daily.   triamcinolone cream 0.1 % Commonly known as:  KENALOG Apply 1 application topically 2 (two) times daily.       Vitals:   07/20/17 0556 07/20/17 0758  BP: 139/74   Pulse: 95   Resp: 20   Temp: 98.4 F (36.9 C)   SpO2: 92% 96%    Skin clean, dry  and intact without evidence of skin break down, no evidence of skin tears noted. IV catheter discontinued intact. Site without signs and symptoms of complications. Dressing and pressure applied. Pt denies pain at this time. No complaints noted.  An After Visit Summary was printed and given to the patient. Patient escorted via [place], and D/C home via private auto.  Deanna Gay A

## 2017-07-20 NOTE — Progress Notes (Signed)
Pt called out saying she coughed upher pills. RN entered room and there was visible pieces of pill in toilet. This RN and Darrol Jump, RN visualized sediment of pill in the commode. We flushed the pills and wasted in the pyxsis. MD called and ordered one time dose to replace what was coughed up.

## 2017-07-20 NOTE — Care Management Important Message (Signed)
Important Message  Patient Details  Name: Deanna Gay MRN: [id number] Date of Birth: [birth year]   Medicare Important Message Given:  N/A - LOS <3 / Initial given by admissions    Beverly Sessions, RN 07/20/2017, 2:45 PM

## 2017-07-20 NOTE — Discharge Instructions (Signed)
1. AVOID any pets, dust, mold 2. NO smoking 3. AVOID sick contacts 4. USE your inhalers and nebulizers for wheezing 5. FINISH the prednisone taper and antibiotics as prescribed.

## 2017-07-21 LAB — HIV ANTIBODY (ROUTINE TESTING W REFLEX): HIV SCREEN 4TH GENERATION: NONREACTIVE

## 2017-07-21 NOTE — Discharge Summary (Signed)
[facility] [place] NAME: Deanna Gay    MR#:  [id number]  DATE OF BIRTH:  [birth year]  DATE OF ADMISSION:  07/19/2017   ADMITTING PHYSICIAN: Loletha Grayer, MD  DATE OF DISCHARGE: 07/20/2017  4:53 PM  PRIMARY CARE PHYSICIAN: Kirk Ruths, MD   ADMISSION DIAGNOSIS:   COPD exacerbation ([place]) [J44.1] Community acquired pneumonia of left lower lobe of lung ([place]) [J18.1]  DISCHARGE DIAGNOSIS:   Active Problems:   COPD exacerbation ([place])   SECONDARY DIAGNOSIS:   Past Medical History:  Diagnosis Date  . Arthritis   . Cancer ([place])    cervical  . COPD (chronic obstructive pulmonary disease) ([place])   . Depression   . Diabetes mellitus without complication ([place])   . GERD (gastroesophageal reflux disease)   . Hypertension   . Schizophrenia ([place])   . Sleep apnea     HOSPITAL COURSE:   62 year old female with past medical history significant for COPD not on home oxygen, ongoing smoking, hypertension, schizoaffective, depression and anxiety comes to the hospital secondary to worsening shortness of breath and coughing and failed outpatient Z-Pak  1. Acute on chronic COPD exacerbation-not requiring supplemental oxygen. -Received IV steroids, budesonide and DuoNeb's in the hospital. -Still has some scattered wheezing on exam but much improved -Able to ambulate on room air. -Started on Levaquin for bronchitis symptoms. -Patient has been very anxious to be discharged. Feels much better. Will be discharged home with oral prednisone taper and Levaquin. Advised to use her inhalers and stop smoking -Cough medications have been added  2. Hypertension-    continue hydrochlorothiazide, Cardizem  3. Diabetes mellitus-continue Lantus. Sliding scale has been prescribed at discharge while on steroids. Also on metformin at home.  4. GERD-Prilosec  Will be discharged home today  DISCHARGE CONDITIONS:   Guarded  CONSULTS OBTAINED:    None  DRUG ALLERGIES:   Allergies  Allergen Reactions  . Iodine Swelling  . Penicillins Swelling    Has patient had a PCN reaction causing immediate rash, facial/tongue/throat swelling, SOB or lightheadedness with hypotension: Yes Has patient had a PCN reaction causing severe rash involving mucus membranes or skin necrosis: Yes Has patient had a PCN reaction that required hospitalization: No Has patient had a PCN reaction occurring within the last 10 years: No If all of the above answers are "NO", then may proceed with Cephalosporin use.    DISCHARGE MEDICATIONS:   Allergies as of 07/20/2017      Reactions   Iodine Swelling   Penicillins Swelling   Has patient had a PCN reaction causing immediate rash, facial/tongue/throat swelling, SOB or lightheadedness with hypotension: Yes Has patient had a PCN reaction causing severe rash involving mucus membranes or skin necrosis: Yes Has patient had a PCN reaction that required hospitalization: No Has patient had a PCN reaction occurring within the last 10 years: No If all of the above answers are "NO", then may proceed with Cephalosporin use.      Medication List    TAKE these medications   albuterol 108 (90 Base) MCG/ACT inhaler Commonly known as:  PROVENTIL HFA;VENTOLIN HFA Inhale 2 puffs into the lungs every 4 (four) hours as needed for wheezing or shortness of breath.   aspirin 81 MG chewable tablet Chew 1 tablet (81 mg total) by mouth 2 (two) times daily.   benzonatate 200 MG capsule Commonly known as:  TESSALON Take 1 capsule (200 mg total) by mouth 3 (three) times daily.  budesonide-formoterol 160-4.5 MCG/ACT inhaler Commonly known as:  SYMBICORT Inhale 2 puffs into the lungs 2 (two) times daily.   dicyclomine 10 MG capsule Commonly known as:  BENTYL Take 10 mg by mouth 3 (three) times daily as needed for spasms.   diltiazem 180 MG 24 hr capsule Commonly known as:  CARDIZEM CD Take 180 mg by mouth daily.     doxycycline 100 MG capsule Commonly known as:  VIBRAMYCIN Take 1 capsule (100 mg total) by mouth 2 (two) times daily for 10 days.   enalapril 20 MG tablet Commonly known as:  VASOTEC take 1 tablet by mouth twice a day   gabapentin 100 MG capsule Commonly known as:  NEURONTIN Take 1 capsule (100 mg total) by mouth 3 (three) times daily.   guaiFENesin-dextromethorphan 100-10 MG/5ML syrup Commonly known as:  ROBITUSSIN DM Take 5 mLs by mouth every 4 (four) hours as needed for cough.   hydrochlorothiazide 25 MG tablet Commonly known as:  HYDRODIURIL Take 25 mg by mouth daily.   insulin glargine 100 UNIT/ML injection Commonly known as:  LANTUS Inject 15 Units into the skin at bedtime.   insulin lispro 100 UNIT/ML injection Commonly known as:  HUMALOG Please check your sugars before each meal and use lispro per the below sliding scale:  For sugars 151-200: take 2 units For 201-250: take 4units For 251-300: take 6 units For 301-350: take 8 units For 351-400: take 10 units For > 401: call MD   ipratropium-albuterol 0.5-2.5 (3) MG/3ML Soln Commonly known as:  DUONEB Take 3 mLs by nebulization every 6 (six) hours as needed.   iron polysaccharides 150 MG capsule Commonly known as:  IFEREX 150 Take 1 capsule (150 mg total) by mouth 2 (two) times daily.   metFORMIN 500 MG tablet Commonly known as:  GLUCOPHAGE Take 500 mg by mouth 2 (two) times daily with a meal.   omeprazole 20 MG capsule Commonly known as:  PRILOSEC Take 20 mg by mouth 2 (two) times daily before a meal.   oxyCODONE-acetaminophen 7.5-325 MG tablet Commonly known as:  PERCOCET Take 1-2 tablets by mouth every 6 (six) hours as needed for severe pain.   predniSONE 10 MG tablet Commonly known as:  DELTASONE Take 1 tablet (10 mg total) by mouth daily. 6 tabs PO x 2 days 5 tabs PO x 2 days 4 tabs PO x 2 days 3 tabs PO x 2 days 2 tabs PO x 2 days 1 tab PO x 2 days and stop   triamcinolone 0.1 %  paste Commonly known as:  KENALOG Use as directed 1 application in the mouth or throat daily.   triamcinolone cream 0.1 % Commonly known as:  KENALOG Apply 1 application topically 2 (two) times daily.        DISCHARGE INSTRUCTIONS:   1. PCP follow-up in 1-2 weeks  DIET:   Cardiac diet  ACTIVITY:   Activity as tolerated  OXYGEN:   Home Oxygen: No.  Oxygen Delivery: room air  DISCHARGE LOCATION:   home   If you experience worsening of your admission symptoms, develop shortness of breath, life threatening emergency, suicidal or homicidal thoughts you must seek medical attention immediately by calling 911 or calling your MD immediately  if symptoms less severe.  You Must read complete instructions/literature along with all the possible adverse reactions/side effects for all the Medicines you take and that have been prescribed to you. Take any new Medicines after you have completely understood and accpet all the  possible adverse reactions/side effects.   Please note  You were cared for by a hospitalist during your hospital stay. If you have any questions about your discharge medications or the care you received while you were in the hospital after you are discharged, you can call the unit and asked to speak with the hospitalist on call if the hospitalist that took care of you is not available. Once you are discharged, your primary care physician will handle any further medical issues. Please note that NO REFILLS for any discharge medications will be authorized once you are discharged, as it is imperative that you return to your primary care physician (or establish a relationship with a primary care physician if you do not have one) for your aftercare needs so that they can reassess your need for medications and monitor your lab values.    On the day of Discharge:  VITAL SIGNS:   Blood pressure 139/74, pulse 95, temperature 98.4 F (36.9 C), resp. rate 20, height 5' 4"  (1.626  m), weight 92.5 kg (204 lb), SpO2 96 %.  PHYSICAL EXAMINATION:    GENERAL:  62 y.o.-year-old obese patient lying in the bed with no acute distress.  EYES: Pupils equal, round, reactive to light and accommodation. No scleral icterus. Extraocular muscles intact.  HEENT: Head atraumatic, normocephalic. Oropharynx and nasopharynx clear.  NECK:  Supple, no jugular venous distention. No thyroid enlargement, no tenderness.  LUNGS: Normal breath sounds bilaterally, minimal scattered wheezing diffusely, no rales,rhonchi or crepitation. No use of accessory muscles of respiration.  CARDIOVASCULAR: S1, S2 normal. No murmurs, rubs, or gallops.  ABDOMEN: Soft, non-tender, non-distended. Bowel sounds present. No organomegaly or mass.  EXTREMITIES: No pedal edema, cyanosis, or clubbing.  NEUROLOGIC: Cranial nerves II through XII are intact. Muscle strength 5/5 in all extremities. Sensation intact. Gait not checked.  PSYCHIATRIC: The patient is alert and oriented x 3.  SKIN: No obvious rash, lesion, or ulcer.   DATA REVIEW:   CBC Recent Labs  Lab 07/20/17 0429  WBC 7.1  HGB 11.4*  HCT 35.0  PLT 254    Chemistries  Recent Labs  Lab 07/20/17 0429  NA 133*  K 4.8  CL 94*  CO2 29  GLUCOSE 279*  BUN 12  CREATININE 0.78  CALCIUM 8.2*     Microbiology Results  Results for orders placed or performed during the hospital encounter of 07/19/17  MRSA PCR Screening     Status: None   Collection Time: 07/19/17  3:57 PM  Result Value Ref Range Status   MRSA by PCR NEGATIVE NEGATIVE Final    Comment:        The GeneXpert MRSA Assay (FDA approved for NASAL specimens only), is one component of a comprehensive MRSA colonization surveillance program. It is not intended to diagnose MRSA infection nor to guide or monitor treatment for MRSA infections. Performed at [facility], [address] [place], [place] [address]   Respiratory Panel by PCR     Status: None   Collection Time:  07/19/17  3:57 PM  Result Value Ref Range Status   Adenovirus NOT DETECTED NOT DETECTED Final   Coronavirus 229E NOT DETECTED NOT DETECTED Final   Coronavirus HKU1 NOT DETECTED NOT DETECTED Final   Coronavirus NL63 NOT DETECTED NOT DETECTED Final   Coronavirus OC43 NOT DETECTED NOT DETECTED Final   Metapneumovirus NOT DETECTED NOT DETECTED Final   Rhinovirus / Enterovirus NOT DETECTED NOT DETECTED Final   Influenza A NOT DETECTED NOT DETECTED Final  Influenza B NOT DETECTED NOT DETECTED Final   Parainfluenza Virus 1 NOT DETECTED NOT DETECTED Final   Parainfluenza Virus 2 NOT DETECTED NOT DETECTED Final   Parainfluenza Virus 3 NOT DETECTED NOT DETECTED Final   Parainfluenza Virus 4 NOT DETECTED NOT DETECTED Final   Respiratory Syncytial Virus NOT DETECTED NOT DETECTED Final   Bordetella pertussis NOT DETECTED NOT DETECTED Final   Chlamydophila pneumoniae NOT DETECTED NOT DETECTED Final   Mycoplasma pneumoniae NOT DETECTED NOT DETECTED Final    Comment: Performed at [facility], [place] [address] [place], [place] [address]    RADIOLOGY:  No results found.   Management plans discussed with the patient, family and they are in agreement.  CODE STATUS:  Code Status History    Date Active Date Inactive Code Status Order ID Comments User Context   07/19/2017 13:50 07/20/2017 19:58 Full Code [id number]  Loletha Grayer, MD ED   05/16/2016 16:26 05/20/2016 14:56 Full Code [id number]  Mcarthur Rossetti, MD Inpatient   02/27/2015 01:22 02/28/2015 16:35 Full Code [id number]  Harrie Foreman, MD Inpatient      TOTAL TIME TAKING CARE OF THIS PATIENT: 38 minutes.    Gladstone Lighter M.D on 07/21/2017 at 3:15 PM  Between 7am to 6pm - Pager - [phone]  After 6pm go to [email or web address] - [employer] [facility]  Office  [phone]  CC: Primary care physician; Kirk Ruths, MD   Note: This dictation was prepared with Dragon dictation  along with smaller phrase technology. Any transcriptional errors that result from this process are unintentional.

## 2017-07-23 ENCOUNTER — Telehealth: Payer: Self-pay

## 2017-07-23 NOTE — Telephone Encounter (Signed)
EMMI Follow-up: Deanna Gay said she had just received a call from my phone number and she is listed on the future call list.  I explained we had a new process that we were following up with patients after discharge and to expect another automated call with different questions.  She has follow-up appt. with Dr. Ouida Sills and no concerns at this time.

## 2017-11-11 ENCOUNTER — Inpatient Hospital Stay
Admission: EM | Admit: 2017-11-11 | Discharge: 2017-11-19 | DRG: 871 | Disposition: A | Discharge status: Home Health | Payer: Medicare Other | Attending: Internal Medicine | Admitting: Internal Medicine

## 2017-11-11 ENCOUNTER — Inpatient Hospital Stay: Payer: Medicare Other

## 2017-11-11 ENCOUNTER — Other Ambulatory Visit: Payer: Self-pay

## 2017-11-11 ENCOUNTER — Emergency Department: Payer: Medicare Other

## 2017-11-11 ENCOUNTER — Encounter: Payer: Self-pay | Admitting: Emergency Medicine

## 2017-11-11 DIAGNOSIS — M1990 Unspecified osteoarthritis, unspecified site: Secondary | ICD-10-CM | POA: Diagnosis present

## 2017-11-11 DIAGNOSIS — Z8249 Family history of ischemic heart disease and other diseases of the circulatory system: Secondary | ICD-10-CM

## 2017-11-11 DIAGNOSIS — I248 Other forms of acute ischemic heart disease: Secondary | ICD-10-CM | POA: Diagnosis present

## 2017-11-11 DIAGNOSIS — E1129 Type 2 diabetes mellitus with other diabetic kidney complication: Secondary | ICD-10-CM | POA: Diagnosis present

## 2017-11-11 DIAGNOSIS — A419 Sepsis, unspecified organism: Secondary | ICD-10-CM | POA: Diagnosis not present

## 2017-11-11 DIAGNOSIS — R7881 Bacteremia: Secondary | ICD-10-CM | POA: Diagnosis not present

## 2017-11-11 DIAGNOSIS — A4151 Sepsis due to Escherichia coli [E. coli]: Principal | ICD-10-CM | POA: Diagnosis present

## 2017-11-11 DIAGNOSIS — R52 Pain, unspecified: Secondary | ICD-10-CM

## 2017-11-11 DIAGNOSIS — J9601 Acute respiratory failure with hypoxia: Secondary | ICD-10-CM | POA: Diagnosis not present

## 2017-11-11 DIAGNOSIS — I10 Essential (primary) hypertension: Secondary | ICD-10-CM | POA: Diagnosis present

## 2017-11-11 DIAGNOSIS — Z8541 Personal history of malignant neoplasm of cervix uteri: Secondary | ICD-10-CM

## 2017-11-11 DIAGNOSIS — E871 Hypo-osmolality and hyponatremia: Secondary | ICD-10-CM | POA: Diagnosis present

## 2017-11-11 DIAGNOSIS — N390 Urinary tract infection, site not specified: Secondary | ICD-10-CM | POA: Diagnosis present

## 2017-11-11 DIAGNOSIS — Z794 Long term (current) use of insulin: Secondary | ICD-10-CM

## 2017-11-11 DIAGNOSIS — F209 Schizophrenia, unspecified: Secondary | ICD-10-CM | POA: Diagnosis present

## 2017-11-11 DIAGNOSIS — Z992 Dependence on renal dialysis: Secondary | ICD-10-CM

## 2017-11-11 DIAGNOSIS — G4733 Obstructive sleep apnea (adult) (pediatric): Secondary | ICD-10-CM | POA: Diagnosis present

## 2017-11-11 DIAGNOSIS — Z780 Asymptomatic menopausal state: Secondary | ICD-10-CM | POA: Diagnosis not present

## 2017-11-11 DIAGNOSIS — R6521 Severe sepsis with septic shock: Secondary | ICD-10-CM | POA: Diagnosis present

## 2017-11-11 DIAGNOSIS — R531 Weakness: Secondary | ICD-10-CM | POA: Diagnosis present

## 2017-11-11 DIAGNOSIS — K219 Gastro-esophageal reflux disease without esophagitis: Secondary | ICD-10-CM | POA: Diagnosis present

## 2017-11-11 DIAGNOSIS — J9622 Acute and chronic respiratory failure with hypercapnia: Secondary | ICD-10-CM | POA: Diagnosis present

## 2017-11-11 DIAGNOSIS — J9621 Acute and chronic respiratory failure with hypoxia: Secondary | ICD-10-CM | POA: Diagnosis present

## 2017-11-11 DIAGNOSIS — J441 Chronic obstructive pulmonary disease with (acute) exacerbation: Secondary | ICD-10-CM

## 2017-11-11 DIAGNOSIS — G9341 Metabolic encephalopathy: Secondary | ICD-10-CM | POA: Diagnosis present

## 2017-11-11 DIAGNOSIS — R6520 Severe sepsis without septic shock: Secondary | ICD-10-CM | POA: Diagnosis not present

## 2017-11-11 DIAGNOSIS — E860 Dehydration: Secondary | ICD-10-CM

## 2017-11-11 DIAGNOSIS — N170 Acute kidney failure with tubular necrosis: Secondary | ICD-10-CM | POA: Diagnosis present

## 2017-11-11 DIAGNOSIS — Z6841 Body Mass Index (BMI) 40.0 and over, adult: Secondary | ICD-10-CM

## 2017-11-11 DIAGNOSIS — Z888 Allergy status to other drugs, medicaments and biological substances status: Secondary | ICD-10-CM

## 2017-11-11 DIAGNOSIS — E872 Acidosis: Secondary | ICD-10-CM | POA: Diagnosis present

## 2017-11-11 DIAGNOSIS — Z7982 Long term (current) use of aspirin: Secondary | ICD-10-CM

## 2017-11-11 DIAGNOSIS — F17210 Nicotine dependence, cigarettes, uncomplicated: Secondary | ICD-10-CM | POA: Diagnosis present

## 2017-11-11 DIAGNOSIS — E875 Hyperkalemia: Secondary | ICD-10-CM | POA: Diagnosis present

## 2017-11-11 DIAGNOSIS — N179 Acute kidney failure, unspecified: Secondary | ICD-10-CM

## 2017-11-11 DIAGNOSIS — Z880 Allergy status to penicillin: Secondary | ICD-10-CM

## 2017-11-11 DIAGNOSIS — Z96642 Presence of left artificial hip joint: Secondary | ICD-10-CM | POA: Diagnosis present

## 2017-11-11 DIAGNOSIS — Z79899 Other long term (current) drug therapy: Secondary | ICD-10-CM

## 2017-11-11 DIAGNOSIS — R0902 Hypoxemia: Secondary | ICD-10-CM

## 2017-11-11 DIAGNOSIS — Z9049 Acquired absence of other specified parts of digestive tract: Secondary | ICD-10-CM

## 2017-11-11 LAB — COMPREHENSIVE METABOLIC PANEL
ALK PHOS: 110 U/L (ref 38–126)
ALT: 21 U/L (ref 0–44)
ANION GAP: 21 — AB (ref 5–15)
AST: 39 U/L (ref 15–41)
Albumin: 2.9 g/dL — ABNORMAL LOW (ref 3.5–5.0)
BILIRUBIN TOTAL: 1.1 mg/dL (ref 0.3–1.2)
BUN: 55 mg/dL — ABNORMAL HIGH (ref 8–23)
CALCIUM: 7.2 mg/dL — AB (ref 8.9–10.3)
CO2: 21 mmol/L — AB (ref 22–32)
CREATININE: 4.51 mg/dL — AB (ref 0.44–1.00)
Chloride: 87 mmol/L — ABNORMAL LOW (ref 98–111)
GFR calc non Af Amer: 10 mL/min — ABNORMAL LOW (ref >60)
GFR, EST AFRICAN AMERICAN: 11 mL/min — AB (ref >60)
GLUCOSE: 214 mg/dL — AB (ref 70–99)
Potassium: 4.5 mmol/L (ref 3.5–5.1)
SODIUM: 129 mmol/L — AB (ref 135–145)
TOTAL PROTEIN: 7.5 g/dL (ref 6.5–8.1)

## 2017-11-11 LAB — URINALYSIS, ROUTINE W REFLEX MICROSCOPIC
Bilirubin Urine: NEGATIVE
Glucose, UA: NEGATIVE mg/dL
Ketones, ur: NEGATIVE mg/dL
Nitrite: NEGATIVE
Protein, ur: 100 mg/dL — AB
Specific Gravity, Urine: 1.017 (ref 1.005–1.030)
WBC, UA: >50 WBC/hpf — ABNORMAL HIGH (ref 0–5)
pH: 5 (ref 5.0–8.0)

## 2017-11-11 LAB — CBC WITH DIFFERENTIAL/PLATELET
Basophils Absolute: 0 10*3/uL (ref 0–0.1)
Basophils Relative: 0 %
EOS ABS: 0.1 10*3/uL (ref 0–0.7)
EOS PCT: 0 %
HCT: 37.3 % (ref 35.0–47.0)
Hemoglobin: 11.9 g/dL — ABNORMAL LOW (ref 12.0–16.0)
Lymphocytes Relative: 2 %
Lymphs Abs: 0.3 10*3/uL — ABNORMAL LOW (ref 1.0–3.6)
MCH: 26.6 pg (ref 26.0–34.0)
MCHC: 31.8 g/dL — AB (ref 32.0–36.0)
MCV: 83.8 fL (ref 80.0–100.0)
MONO ABS: 0.4 10*3/uL (ref 0.2–0.9)
Monocytes Relative: 2 %
Neutro Abs: 16.4 10*3/uL — ABNORMAL HIGH (ref 1.4–6.5)
Neutrophils Relative %: 96 %
PLATELETS: 242 10*3/uL (ref 150–440)
RBC: 4.45 MIL/uL (ref 3.80–5.20)
RDW: 15.6 % — AB (ref 11.5–14.5)
WBC: 17.1 10*3/uL — AB (ref 3.6–11.0)

## 2017-11-11 LAB — PROCALCITONIN: PROCALCITONIN: 139.27 ng/mL

## 2017-11-11 LAB — LIPASE, BLOOD: Lipase: 16 U/L (ref 11–51)

## 2017-11-11 LAB — GLUCOSE, CAPILLARY: Glucose-Capillary: 267 mg/dL — ABNORMAL HIGH (ref 70–99)

## 2017-11-11 LAB — TROPONIN I
Troponin I: 0.03 ng/mL (ref <0.03)
Troponin I: <0.03 ng/mL (ref <0.03)

## 2017-11-11 LAB — LACTIC ACID, PLASMA
LACTIC ACID, VENOUS: 3.3 mmol/L — AB (ref 0.5–1.9)
Lactic Acid, Venous: 4.8 mmol/L (ref 0.5–1.9)
Lactic Acid, Venous: 4.9 mmol/L (ref 0.5–1.9)

## 2017-11-11 LAB — CK: CK TOTAL: 36 U/L — AB (ref 38–234)

## 2017-11-11 LAB — HEMOGLOBIN A1C
Hgb A1c MFr Bld: 7.6 % — ABNORMAL HIGH (ref 4.8–5.6)
MEAN PLASMA GLUCOSE: 171.42 mg/dL

## 2017-11-11 LAB — MRSA PCR SCREENING: MRSA BY PCR: NEGATIVE

## 2017-11-11 MED ORDER — SODIUM CHLORIDE 0.9 % IV BOLUS: 1000.0000 mL | Freq: Once | INTRAVENOUS | Status: DC

## 2017-11-11 MED ORDER — IPRATROPIUM-ALBUTEROL 0.5-2.5 (3) MG/3ML IN SOLN
3.0000 mL | Freq: Four times a day (QID) | RESPIRATORY_TRACT | Status: DC
  Administered 2017-11-11 – 2017-11-18 (×26): 3 mL via RESPIRATORY_TRACT
  Filled 2017-11-11 (×26): qty 3

## 2017-11-11 MED ORDER — LEVOFLOXACIN IN D5W 750 MG/150ML IV SOLN
750.0000 mg | Freq: Once | INTRAVENOUS | Status: DC
  Administered 2017-11-11: 750 mg via INTRAVENOUS
  Filled 2017-11-11: qty 150

## 2017-11-11 MED ORDER — IPRATROPIUM-ALBUTEROL 0.5-2.5 (3) MG/3ML IN SOLN
3.0000 mL | Freq: Once | RESPIRATORY_TRACT | Status: AC
  Administered 2017-11-11: 3 mL via RESPIRATORY_TRACT
  Filled 2017-11-11: qty 3

## 2017-11-11 MED ORDER — INSULIN ASPART 100 UNIT/ML ~~LOC~~ SOLN
0.0000 [IU] | Freq: Every day | SUBCUTANEOUS | Status: DC
  Administered 2017-11-11: 3 [IU] via SUBCUTANEOUS
  Administered 2017-11-12 – 2017-11-15 (×2): 2 [IU] via SUBCUTANEOUS
  Administered 2017-11-16: 3 [IU] via SUBCUTANEOUS
  Filled 2017-11-11 (×4): qty 1

## 2017-11-11 MED ORDER — ONDANSETRON HCL 4 MG PO TABS: 4.0000 mg | ORAL_TABLET | Freq: Four times a day (QID) | ORAL | Status: DC | PRN

## 2017-11-11 MED ORDER — SODIUM CHLORIDE 0.9 % IV BOLUS
500.0000 mL | Freq: Once | INTRAVENOUS | Status: AC
  Administered 2017-11-11: 500 mL via INTRAVENOUS

## 2017-11-11 MED ORDER — HEPARIN SODIUM (PORCINE) 5000 UNIT/ML IJ SOLN
5000.0000 [IU] | Freq: Three times a day (TID) | INTRAMUSCULAR | Status: DC
  Administered 2017-11-11 – 2017-11-19 (×18): 5000 [IU] via SUBCUTANEOUS
  Filled 2017-11-11 (×18): qty 1

## 2017-11-11 MED ORDER — INSULIN ASPART 100 UNIT/ML ~~LOC~~ SOLN
0.0000 [IU] | Freq: Three times a day (TID) | SUBCUTANEOUS | Status: DC
Start: 2017-11-11 — End: 2017-11-19
  Administered 2017-11-12 (×2): 5 [IU] via SUBCUTANEOUS
  Administered 2017-11-12: 8 [IU] via SUBCUTANEOUS
  Administered 2017-11-13: 3 [IU] via SUBCUTANEOUS
  Administered 2017-11-13: 5 [IU] via SUBCUTANEOUS
  Administered 2017-11-13: 3 [IU] via SUBCUTANEOUS
  Administered 2017-11-14: 8 [IU] via SUBCUTANEOUS
  Administered 2017-11-14: 3 [IU] via SUBCUTANEOUS
  Administered 2017-11-16 (×2): 8 [IU] via SUBCUTANEOUS
  Administered 2017-11-17: 3 [IU] via SUBCUTANEOUS
  Administered 2017-11-18: 15 [IU] via SUBCUTANEOUS
  Administered 2017-11-18: 8 [IU] via SUBCUTANEOUS
  Administered 2017-11-18 – 2017-11-19 (×2): 2 [IU] via SUBCUTANEOUS
  Administered 2017-11-19: 3 [IU] via SUBCUTANEOUS
  Filled 2017-11-11 (×16): qty 1

## 2017-11-11 MED ORDER — IPRATROPIUM-ALBUTEROL 0.5-2.5 (3) MG/3ML IN SOLN
3.0000 mL | Freq: Once | RESPIRATORY_TRACT | Status: AC
  Administered 2017-11-11: 3 mL via RESPIRATORY_TRACT

## 2017-11-11 MED ORDER — ONDANSETRON HCL 4 MG/2ML IJ SOLN
4.0000 mg | Freq: Four times a day (QID) | INTRAMUSCULAR | Status: DC | PRN
  Administered 2017-11-11 – 2017-11-12 (×3): 4 mg via INTRAVENOUS
  Filled 2017-11-11 (×3): qty 2

## 2017-11-11 MED ORDER — SODIUM CHLORIDE 0.9 % IV BOLUS
1000.0000 mL | Freq: Once | INTRAVENOUS | Status: AC
  Administered 2017-11-11: 1000 mL via INTRAVENOUS

## 2017-11-11 MED ORDER — TRAMADOL HCL 50 MG PO TABS: 50.0000 mg | ORAL_TABLET | Freq: Four times a day (QID) | ORAL | Status: DC | PRN

## 2017-11-11 MED ORDER — METHYLPREDNISOLONE SODIUM SUCC 125 MG IJ SOLR
60.0000 mg | Freq: Two times a day (BID) | INTRAMUSCULAR | Status: DC
  Administered 2017-11-11 – 2017-11-13 (×4): 60 mg via INTRAVENOUS
  Filled 2017-11-11 (×4): qty 2

## 2017-11-11 MED ORDER — METHYLPREDNISOLONE SODIUM SUCC 125 MG IJ SOLR
125.0000 mg | INTRAMUSCULAR | Status: AC
  Administered 2017-11-11: 125 mg via INTRAVENOUS
  Filled 2017-11-11: qty 2

## 2017-11-11 MED ORDER — ALBUTEROL SULFATE (2.5 MG/3ML) 0.083% IN NEBU: 2.5000 mg | INHALATION_SOLUTION | RESPIRATORY_TRACT | Status: DC | PRN

## 2017-11-11 MED ORDER — ENOXAPARIN SODIUM 40 MG/0.4ML ~~LOC~~ SOLN
40.0000 mg | SUBCUTANEOUS | Status: DC
Start: 2017-11-11 — End: 2017-11-11

## 2017-11-11 MED ORDER — SODIUM CHLORIDE 0.9 % IV SOLN
2.0000 g | Freq: Once | INTRAVENOUS | Status: AC
  Administered 2017-11-11: 2 g via INTRAVENOUS
  Filled 2017-11-11: qty 2

## 2017-11-11 MED ORDER — POLYETHYLENE GLYCOL 3350 17 G PO PACK: 17.0000 g | PACK | Freq: Every day | ORAL | Status: DC | PRN

## 2017-11-11 MED ORDER — LEVOFLOXACIN IN D5W 500 MG/100ML IV SOLN: 500.0000 mg | INTRAVENOUS | Status: DC

## 2017-11-11 MED ORDER — ACETAMINOPHEN 325 MG PO TABS
650.0000 mg | ORAL_TABLET | Freq: Four times a day (QID) | ORAL | Status: DC | PRN
  Administered 2017-11-12 – 2017-11-18 (×5): 650 mg via ORAL
  Filled 2017-11-11 (×5): qty 2

## 2017-11-11 MED ORDER — ACETAMINOPHEN 650 MG RE SUPP: 650.0000 mg | Freq: Four times a day (QID) | RECTAL | Status: DC | PRN

## 2017-11-11 MED ORDER — IPRATROPIUM-ALBUTEROL 0.5-2.5 (3) MG/3ML IN SOLN
3.0000 mL | Freq: Once | RESPIRATORY_TRACT | Status: AC
  Administered 2017-11-11: 3 mL via RESPIRATORY_TRACT
  Filled 2017-11-11: qty 6

## 2017-11-11 MED ORDER — SODIUM CHLORIDE 0.9 % IV BOLUS (SEPSIS)
1000.0000 mL | Freq: Once | INTRAVENOUS | Status: AC
  Administered 2017-11-11: 1000 mL via INTRAVENOUS

## 2017-11-11 MED ORDER — BUDESONIDE 0.25 MG/2ML IN SUSP
0.2500 mg | Freq: Two times a day (BID) | RESPIRATORY_TRACT | Status: DC
  Administered 2017-11-11 – 2017-11-19 (×16): 0.25 mg via RESPIRATORY_TRACT
  Filled 2017-11-11 (×17): qty 2

## 2017-11-11 MED ORDER — NICOTINE 14 MG/24HR TD PT24
14.0000 mg | MEDICATED_PATCH | Freq: Every day | TRANSDERMAL | Status: DC
  Administered 2017-11-11 – 2017-11-19 (×9): 14 mg via TRANSDERMAL
  Filled 2017-11-11 (×9): qty 1

## 2017-11-11 NOTE — ED Notes (Signed)
Patient transported to Ultrasound 

## 2017-11-11 NOTE — ED Provider Notes (Signed)
[facility] Emergency Department Provider Note   ____________________________________________   First MD Initiated Contact with Patient 11/11/17 1216     (approximate)  I have reviewed the triage vital signs and the nursing notes.   HISTORY  Chief Complaint Hypotension    HPI Deanna Gay is a 62 y.o. female presents today reports that she is been feeling quite ill for about the last 4 to 5 days.  Reports that she started feeling weak, tired, lost her appetite, occasional nausea, slight coughing wheezing and some slight shortness of breath since Monday.  She also reports on Monday she noticed her urine smelled very bad and is had a abnormal urine odor as well since that time.  Reports she saw her pulmonologist and was on steroids about a week ago, but has not been on any antibiotics for some time.  She has no she had a slight increase in wheezing, reports she was always has some wheezing.  She is felt just slightly short of breath, denies abdominal pain.  No chest pain.  No headaches.  No weakness in the arms or legs, except when she tries to walk she gets very lightheaded and feels that she can pass out for about the last day and a half.  She went to try see her primary doctor's office today, reports they referred her here immediately.    Past Medical History:  Diagnosis Date  . Arthritis   . Cancer ([place])    cervical  . COPD (chronic obstructive pulmonary disease) ([place])   . Depression   . Diabetes mellitus without complication ([place])   . GERD (gastroesophageal reflux disease)   . Hypertension   . Schizophrenia ([place])   . Sleep apnea     Patient Active Problem List   Diagnosis Date Noted  . Severe sepsis ([place]) 11/11/2017  . COPD exacerbation ([place]) 07/19/2017  . PMB (postmenopausal bleeding) 12/04/2016  . Bilateral carpal tunnel syndrome 10/14/2016  . Healthcare maintenance 08/18/2016  . Unilateral primary osteoarthritis, left hip 05/16/2016    . Status post left hip replacement 05/16/2016  . Iron deficiency anemia 02/27/2015  . Aortic atherosclerosis ([place]) 03/04/2014  . Sleep apnea 10/31/2013  . Hyperlipidemia, unspecified 09/23/2013  . 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 Hypertension, essential 08/27/2013  . COPD (chronic obstructive pulmonary disease) ([place]) 08/27/2013  . Chronic undifferentiated schizophrenia ([place]) 08/27/2013    Past Surgical History:  Procedure Laterality Date  . APPENDECTOMY    . COLONOSCOPY WITH PROPOFOL N/A 12/25/2014   Procedure: COLONOSCOPY WITH PROPOFOL;  Surgeon: Josefine Class, MD;  Location: [facility] ENDOSCOPY;  Service: Endoscopy;  Laterality: N/A;  . ESOPHAGOGASTRODUODENOSCOPY (EGD) WITH PROPOFOL N/A 12/25/2014   Procedure: ESOPHAGOGASTRODUODENOSCOPY (EGD) WITH PROPOFOL;  Surgeon: Josefine Class, MD;  Location: [facility] ENDOSCOPY;  Service: Endoscopy;  Laterality: N/A;  . JOINT REPLACEMENT     right hip  . TOTAL HIP ARTHROPLASTY Left 05/16/2016   Procedure: LEFT TOTAL HIP ARTHROPLASTY ANTERIOR APPROACH;  Surgeon: Mcarthur Rossetti, MD;  Location: [facility] ORS;  Service: Orthopedics;  Laterality: Left;    Prior to Admission medications   Medication Sig Start Date End Date Taking? Authorizing Provider  aspirin 81 MG chewable tablet Chew 1 tablet (81 mg total) by mouth 2 (two) times daily. Patient taking differently: Chew 81 mg by mouth daily.  05/18/16  Yes Mcarthur Rossetti, MD  diltiazem [facility] CD) 180  MG 24 hr capsule Take 180 mg by mouth at bedtime.    Yes [provider]  enalapril (VASOTEC) 20 MG tablet TAKE 1 TABLET BY MOUTH TWICE A DAY 03/06/15  Yes [provider]  hydrochlorothiazide (HYDRODIURIL) 25 MG tablet Take 25 mg by mouth daily.   Yes [provider]  insulin glargine (LANTUS) 100 UNIT/ML injection Inject 20 Units into the skin  daily.    Yes [provider]  iron polysaccharides (IFEREX 150) 150 MG capsule Take 1 capsule (150 mg total) by mouth 2 (two) times daily. 06/23/17  Yes Lloyd Huger, MD  metFORMIN (GLUCOPHAGE) 1000 MG tablet Take 1,000 mg by mouth 2 (two) times daily with a meal. 10/08/17  Yes [provider]  methylPREDNISolone (MEDROL DOSEPAK) 4 MG TBPK tablet Take 1 tablet by mouth taper from 4 doses each day to 1 dose and stop.  11/06/17  Yes [provider]  nicotine (NICODERM CQ - DOSED IN MG/24 HOURS) 21 mg/24hr patch Place 1 patch onto the skin daily. 08/07/17  Yes [provider]  omeprazole (PRILOSEC) 20 MG capsule Take 20 mg by mouth 2 (two) times daily before a meal.  03/29/16  Yes [provider]  oxyCODONE-acetaminophen (PERCOCET) 7.5-325 MG tablet Take 1-2 tablets by mouth every 6 (six) hours as needed for severe pain. 05/18/16  Yes Mcarthur Rossetti, MD  risperiDONE (RISPERDAL) 2 MG tablet Take 1 tablet by mouth daily. 08/28/17  Yes [provider]  albuterol (PROVENTIL HFA;VENTOLIN HFA) 108 (90 BASE) MCG/ACT inhaler Inhale 2 puffs into the lungs every 4 (four) hours as needed for wheezing or shortness of breath.     [provider]  benzonatate (TESSALON) 200 MG capsule Take 1 capsule (200 mg total) by mouth 3 (three) times daily. Patient not taking: Reported on 11/11/2017 07/20/17   Gladstone Lighter, MD  budesonide-formoterol [facility]) 160-4.5 MCG/ACT inhaler Inhale 2 puffs into the lungs 2 (two) times daily.    [provider]  gabapentin (NEURONTIN) 100 MG capsule Take 1 capsule (100 mg total) by mouth 3 (three) times daily. Patient not taking: Reported on 11/11/2017 05/20/16   Mcarthur Rossetti, MD  guaiFENesin-dextromethorphan [facility] DM) 100-10 MG/5ML syrup Take 5 mLs by mouth every 4 (four) hours as needed for cough. Patient not taking: Reported on 11/11/2017 07/20/17   Gladstone Lighter, MD  insulin lispro  (HUMALOG) 100 UNIT/ML injection Please check your sugars before each meal and use lispro per the below sliding scale:  For sugars 151-200: take 2 units For 201-250: take 4units For 251-300: take 6 units For 301-350: take 8 units For 351-400: take 10 units For > 401: call MD Patient not taking: Reported on 11/11/2017 07/20/17   Gladstone Lighter, MD  ipratropium-albuterol (DUONEB) 0.5-2.5 (3) MG/3ML SOLN Take 3 mLs by nebulization every 6 (six) hours as needed.    [provider]  nitroGLYCERIN (NITROSTAT) 0.4 MG SL tablet Place 1 tablet under the tongue every 5 (five) minutes as needed. 08/17/17   [provider]  predniSONE (DELTASONE) 10 MG tablet Take 1 tablet (10 mg total) by mouth daily. 6 tabs PO x 2 days 5 tabs PO x 2 days 4 tabs PO x 2 days 3 tabs PO x 2 days 2 tabs PO x 2 days 1 tab PO x 2 days and stop Patient not taking: Reported on 11/11/2017 07/20/17   Gladstone Lighter, MD  triamcinolone (KENALOG) 0.1 % paste Use as directed 1 application in the mouth or  throat daily. 02/23/15   [provider]  triamcinolone cream (KENALOG) 0.1 % Apply 1 application topically 2 (two) times daily.    [provider]    Allergies Iodine and Penicillins  Family History  Problem Relation Age of Onset  . Hypertension Mother   . Diabetes Mellitus II Mother   . Breast cancer Neg Hx   . Kidney cancer Neg Hx   . Bladder Cancer Neg Hx     Social History Social History   Tobacco Use  . Smoking status: Current Every Day Smoker    Packs/day: 1.00    Years: 47.00    Pack years: 47.00  . Smokeless tobacco: Never Used  Substance Use Topics  . Alcohol use: No  . Drug use: No    Review of Systems Constitutional: No fever/chills feeling very fatigued and generally weak Eyes: No visual changes. ENT: No sore throat. Cardiovascular: Denies chest pain. Respiratory: See HPI, wheezing.  Denies productive cough Gastrointestinal: No abdominal pain.  Occasional  nausea.  She reports she has not really had anything hardly to eat or drink the last 2 to 3 days since she has not felt well no diarrhea.  No constipation. Genitourinary: See HPI musculoskeletal: Negative for back pain. Skin: Negative for rash. Neurological: Negative for headaches, focal weakness or numbness.    ____________________________________________   PHYSICAL EXAM:  VITAL SIGNS: ED Triage Vitals  Enc Vitals Group     BP 11/11/17 1204 (!) 85/53     Pulse Rate 11/11/17 1157 (!) 124     Resp 11/11/17 1157 16     Temp 11/11/17 1157 98.7 F (37.1 C)     Temp Source 11/11/17 1157 Oral     SpO2 11/11/17 1157 93 %     Weight 11/11/17 1204 204 lb (92.5 kg)     Height 11/11/17 1204 5' 5"  (1.651 m)     Head Circumference --      Peak Flow --      Pain Score 11/11/17 1204 0     Pain Loc --      Pain Edu? --      Excl. in [place]? --     Constitutional: Alert and oriented.  Generally ill-appearing, no acute distress, but appears at least moderately ill.  Tachycardic, somewhat hypotensive, reporting she feels very fatigued and weak. Eyes: Conjunctivae are normal. Head: Atraumatic. Nose: No congestion/rhinnorhea. Mouth/Throat: Mucous membranes are dry. Neck: No stridor.   Cardiovascular: Tachycardic rate, regular rhythm. Grossly normal heart sounds.  Good peripheral circulation. Respiratory: Mild use of accessory muscles, saturation mid 90s on 2 L nasal cannula.  She has moderate and expiratory wheezing throughout all fields, some very mild tachypnea.  No acute extremitas, but does appear slightly dyspneic.  No focal rales are denoted. Gastrointestinal: Soft and nontender. No distention. Musculoskeletal: No lower extremity tenderness nor edema. Neurologic:  Normal speech and language. No gross focal neurologic deficits are appreciated.  Skin:  Skin is warm, dry and intact. No rash noted. Psychiatric: Mood and affect are normal. Speech and behavior are  normal.  ____________________________________________   LABS (all labs ordered are listed, but only abnormal results are displayed)  Labs Reviewed  LACTIC ACID, PLASMA - Abnormal; Notable for the following components:      Result Value   Lactic Acid, Venous 4.8 (*)    All other components within normal limits  COMPREHENSIVE METABOLIC PANEL - Abnormal; Notable for the following components:   Sodium 129 (*)    Chloride  87 (*)    CO2 21 (*)    Glucose, Bld 214 (*)    BUN 55 (*)    Creatinine, Ser 4.51 (*)    Calcium 7.2 (*)    Albumin 2.9 (*)    GFR calc non Af Amer 10 (*)    GFR calc Af Amer 11 (*)    Anion gap 21 (*)    All other components within normal limits  TROPONIN I - Abnormal; Notable for the following components:   Troponin I 0.03 (*)    All other components within normal limits  CBC WITH DIFFERENTIAL/PLATELET - Abnormal; Notable for the following components:   WBC 17.1 (*)    Hemoglobin 11.9 (*)    MCHC 31.8 (*)    RDW 15.6 (*)    Neutro Abs 16.4 (*)    Lymphs Abs 0.3 (*)    All other components within normal limits  URINALYSIS, ROUTINE W REFLEX MICROSCOPIC - Abnormal; Notable for the following components:   Color, Urine AMBER (*)    APPearance TURBID (*)    Hgb urine dipstick MODERATE (*)    Protein, ur 100 (*)    Leukocytes, UA MODERATE (*)    WBC, UA >50 (*)    Bacteria, UA MANY (*)    All other components within normal limits  CULTURE, BLOOD (ROUTINE X 2)  CULTURE, BLOOD (ROUTINE X 2)  LIPASE, BLOOD  LACTIC ACID, PLASMA  HEMOGLOBIN A1C  CK   ____________________________________________  EKG  Reviewed enterotomy at 1242 Heart rate 120 QRS 80 QTc 430 Sinus tachycardia, no evidence of acute ischemia. ____________________________________________  RADIOLOGY  Chest x-ray reviewed, negative for acute ____________________________________________   PROCEDURES  Procedure(s) performed: None  Procedures  Critical Care performed: Yes, see  critical care note(s)  CRITICAL CARE Performed by: Delman Kitten   Total critical care time: 50 minutes  Critical care time was exclusive of separately billable procedures and treating other patients.  Critical care was necessary to treat or prevent imminent or life-threatening deterioration.  Critical care was time spent personally by me on the following activities: development of treatment plan with patient and/or surrogate as well as nursing, discussions with consultants, evaluation of patient's response to treatment, examination of patient, obtaining history from patient or surrogate, ordering and performing treatments and interventions, ordering and review of laboratory studies, ordering and review of radiographic studies, pulse oximetry and re-evaluation of patient's condition.  Patient required multiple fluid boluses, notably elevated lactate, associated hypotension and a high clinical concern for infectious etiology.  Patient meets criteria for septic shock. ____________________________________________   INITIAL IMPRESSION / ASSESSMENT AND PLAN / ED COURSE  Pertinent labs & imaging results that were available during my care of the patient were reviewed by me and considered in my medical decision making (see chart for details).  No associated neurologic cardiac or pulmonary symptoms except for some slight dyspnea which she reports is near baseline.  She is however notably tachycardic hypotensive, notable leukocytosis, and complaining of urinary symptoms with weakness and decreased urination/thirst.  Denies abdominal pain, no flank pain, no signs or symptoms suggest urinary retention, kidney stone, appendicitis, or other acute intra-abdominal infection denoted at this time.  However, will start initiate broad-spectrum antibiotics for concern of possible urinary tract infection with sepsis as we await further testing.  Clinical Course as of Nov 11 1413  Wed Nov 11, 2017  1326 Critical  lactate noted. Additional IV fluid boluses ordered. Discussed with RN Elmyra Ricks to prioritize fluid resuscitation  and antiobiotics for severe sepsis.   [MQ]  1412 BP 93/51, HR 115. Had 2 liters NS and then became dyspneic. ALert, sat up and reports feels much better. STopped IV fluids as concern ? Flash pulm edema. CXR ordered. Resting well with improvement now that sitting up. Stopped IV fluids. No rash. Sat 100% on RA at present. UPdated Dr. Darvin Neighbours (patient awaiting bed) at this time as well and cxr is pending.    [MQ]    Clinical Course User Index [MQ] Delman Kitten, MD   ----------------------------------------- 12:39 PM on 11/11/2017 -----------------------------------------  Code sepsis initiated.  High risk for potential sepsis or other acute etiology for presentation with Sirs criteria.  Patient is notably hypotensive, tachycardic, awaiting labs including lactic acid.  Blood culture sent.  At this point based on her symptoms of dysuria with associated weakness and some wheezing I have empirically ordered Levaquin as initial antibiotic to be followed by aztreonam, we will further evaluate antibiotic choice once additional testing including urinalysis will be back.    ED Sepsis - Repeat Assessment   Performed at:    ----------------------------------------- 1:30 PM on 11/11/2017 -----------------------------------------    Last Vitals:    Blood pressure 92/64, pulse (!) 124, temperature 98.7 F (37.1 C), temperature source Oral, resp. rate (!) 30, height 5' 5"  (1.651 m), weight 92.5 kg (204 lb), SpO2 93 %.  Heart:      Clear tones, tachycardic but rate slightly improved now to 120  Lungs:     Mild end expiratory wheezing, resting more comfortably now with even seemingly unlabored respirations, speaks in full and complete sentences  Capillary Refill:   Normal, less than 1 second  Peripheral Pulse (include location): Right radial   Skin (include color):   Warm, well-perfused  The  patient reports she is starting to feel improved.  Her blood pressure remains low however, she is still slightly tachycardic and her lactate is now resulted at 4.8.  I am highly concerned for severe sepsis and septic shock, urinalysis has been sent and is pending.  Plan to advance antibiotics further if no evidence of UTI, currently treating based on sysmptoms of probable community acquired UTI.  Patient does report she is feeling improved her breathing has improved.  I have paged hospitalist to request admission at this time.  Have additionally ordered additional fluid, nurse starting second liter normal saline on pressure bag to evaluate further for fluid responsiveness.  If the patient does not show evidence of fluid responsiveness after adequate IV hydration, would consider potential need for vasopressor therapy, though at this point having only received about three quarters of a liter of normal saline we will continue with IV fluid resuscitation as she is tolerating low blood pressures well, alert, oriented mentating reporting she feels somewhat improved.    ____________________________________________   FINAL CLINICAL IMPRESSION(S) / ED DIAGNOSES  Final diagnoses:  Severe sepsis ([place])  Septic shock ([place])  COPD with acute exacerbation ([place])  Acute kidney injury ([place])  Severe dehydration      NEW MEDICATIONS STARTED DURING THIS VISIT:  New Prescriptions   No medications on file     Note:  This document was prepared using Dragon voice recognition software and may include unintentional dictation errors.     Delman Kitten, MD 11/11/17 1415

## 2017-11-11 NOTE — H&P (Signed)
[facility] [place] NAME: Deanna Gay    MR#:  [id number]  DATE OF BIRTH:  1956-05-01  DATE OF ADMISSION:  11/11/2017  PRIMARY CARE PHYSICIAN: Kirk Ruths, MD   REQUESTING/REFERRING PHYSICIAN: Dr. Jacqualine Code  CHIEF COMPLAINT:   Chief Complaint  Patient presents with  . Hypotension    HISTORY OF PRESENT ILLNESS:  Deanna Gay  is a 62 y.o. female with a known history of diabetes mellitus, hypertension, arthritis, schizophrenia, COPD presents to the emergency room complaining of weakness, shortness of breath, nausea and foul-smelling urine.  Here patient has been found to be hypotensive with systolic blood pressure as low as 65.  Has received 5 mL of normal saline till now.  Lactic acid elevated at 4.9.  Patient's urinalysis is pending but being admitted for possible UTI with severe sepsis.  PAST MEDICAL HISTORY:   Past Medical History:  Diagnosis Date  . Arthritis   . Cancer ([place])    cervical  . COPD (chronic obstructive pulmonary disease) ([place])   . Depression   . Diabetes mellitus without complication ([place])   . GERD (gastroesophageal reflux disease)   . Hypertension   . Schizophrenia ([place])   . Sleep apnea     PAST SURGICAL HISTORY:   Past Surgical History:  Procedure Laterality Date  . APPENDECTOMY    . COLONOSCOPY WITH PROPOFOL N/A 12/25/2014   Procedure: COLONOSCOPY WITH PROPOFOL;  Surgeon: Josefine Class, MD;  Location: [facility] ENDOSCOPY;  Service: Endoscopy;  Laterality: N/A;  . ESOPHAGOGASTRODUODENOSCOPY (EGD) WITH PROPOFOL N/A 12/25/2014   Procedure: ESOPHAGOGASTRODUODENOSCOPY (EGD) WITH PROPOFOL;  Surgeon: Josefine Class, MD;  Location: [facility] ENDOSCOPY;  Service: Endoscopy;  Laterality: N/A;  . JOINT REPLACEMENT     right hip  . TOTAL HIP ARTHROPLASTY Left 05/16/2016   Procedure: LEFT TOTAL HIP ARTHROPLASTY ANTERIOR APPROACH;  Surgeon: Mcarthur Rossetti, MD;  Location: [facility] ORS;  Service: Orthopedics;   Laterality: Left;    SOCIAL HISTORY:   Social History   Tobacco Use  . Smoking status: Current Every Day Smoker    Packs/day: 1.00    Years: 47.00    Pack years: 47.00  . Smokeless tobacco: Never Used  Substance Use Topics  . Alcohol use: No    FAMILY HISTORY:   Family History  Problem Relation Age of Onset  . Hypertension Mother   . Diabetes Mellitus II Mother   . Breast cancer Neg Hx   . Kidney cancer Neg Hx   . Bladder Cancer Neg Hx     DRUG ALLERGIES:   Allergies  Allergen Reactions  . Iodine Swelling  . Penicillins Swelling    Has patient had a PCN reaction causing immediate rash, facial/tongue/throat swelling, SOB or lightheadedness with hypotension: Yes Has patient had a PCN reaction causing severe rash involving mucus membranes or skin necrosis: Yes Has patient had a PCN reaction that required hospitalization: No Has patient had a PCN reaction occurring within the last 10 years: No If all of the above answers are "NO", then may proceed with Cephalosporin use.     REVIEW OF SYSTEMS:   Review of Systems  Constitutional: Positive for chills and malaise/fatigue. Negative for fever and weight loss.  HENT: Negative for hearing loss and nosebleeds.   Eyes: Negative for blurred vision, double vision and pain.  Respiratory: Positive for cough, shortness of breath and wheezing. Negative for hemoptysis and sputum production.   Cardiovascular: Negative for chest pain, palpitations, orthopnea and  leg swelling.  Gastrointestinal: Positive for abdominal pain. Negative for constipation, diarrhea, nausea and vomiting.  Genitourinary: Negative for dysuria and hematuria.  Musculoskeletal: Negative for back pain, falls and myalgias.  Skin: Negative for rash.  Neurological: Negative for dizziness, tremors, sensory change, speech change, focal weakness, seizures and headaches.  Endo/Heme/Allergies: Does not bruise/bleed easily.  Psychiatric/Behavioral: Negative for  depression and memory loss. The patient is not nervous/anxious.     MEDICATIONS AT HOME:   Prior to Admission medications   Medication Sig Start Date End Date Taking? Authorizing Provider  albuterol (PROVENTIL HFA;VENTOLIN HFA) 108 (90 BASE) MCG/ACT inhaler Inhale 2 puffs into the lungs every 4 (four) hours as needed for wheezing or shortness of breath.     [provider]  aspirin 81 MG chewable tablet Chew 1 tablet (81 mg total) by mouth 2 (two) times daily. 05/18/16   Mcarthur Rossetti, MD  benzonatate (TESSALON) 200 MG capsule Take 1 capsule (200 mg total) by mouth 3 (three) times daily. 07/20/17   Gladstone Lighter, MD  budesonide-formoterol [facility]) 160-4.5 MCG/ACT inhaler Inhale 2 puffs into the lungs 2 (two) times daily.    [provider]  dicyclomine (BENTYL) 10 MG capsule Take 10 mg by mouth 3 (three) times daily as needed for spasms.    [provider]  diltiazem (CARDIZEM CD) 180 MG 24 hr capsule Take 180 mg by mouth daily.    [provider]  enalapril (VASOTEC) 20 MG tablet take 1 tablet by mouth twice a day 03/06/15   [provider]  gabapentin (NEURONTIN) 100 MG capsule Take 1 capsule (100 mg total) by mouth 3 (three) times daily. 05/20/16   Mcarthur Rossetti, MD  guaiFENesin-dextromethorphan (ROBITUSSIN DM) 100-10 MG/5ML syrup Take 5 mLs by mouth every 4 (four) hours as needed for cough. 07/20/17   Gladstone Lighter, MD  hydrochlorothiazide (HYDRODIURIL) 25 MG tablet Take 25 mg by mouth daily.    [provider]  insulin glargine (LANTUS) 100 UNIT/ML injection Inject 15 Units into the skin at bedtime.    [provider]  insulin lispro (HUMALOG) 100 UNIT/ML injection Please check your sugars before each meal and use lispro per the below sliding scale:  For sugars 151-200: take 2 units For 201-250: take 4units For 251-300: take 6 units For 301-350: take 8 units For 351-400: take 10 units For > 401:  call MD 07/20/17   Gladstone Lighter, MD  ipratropium-albuterol (DUONEB) 0.5-2.5 (3) MG/3ML SOLN Take 3 mLs by nebulization every 6 (six) hours as needed.    [provider]  iron polysaccharides (IFEREX 150) 150 MG capsule Take 1 capsule (150 mg total) by mouth 2 (two) times daily. 06/23/17   Lloyd Huger, MD  omeprazole (PRILOSEC) 20 MG capsule Take 20 mg by mouth 2 (two) times daily before a meal.  03/29/16   [provider]  oxyCODONE-acetaminophen (PERCOCET) 7.5-325 MG tablet Take 1-2 tablets by mouth every 6 (six) hours as needed for severe pain. 05/18/16   Mcarthur Rossetti, MD  predniSONE (DELTASONE) 10 MG tablet Take 1 tablet (10 mg total) by mouth daily. 6 tabs PO x 2 days 5 tabs PO x 2 days 4 tabs PO x 2 days 3 tabs PO x 2 days 2 tabs PO x 2 days 1 tab PO x 2 days and stop 07/20/17   Gladstone Lighter, MD  triamcinolone (KENALOG) 0.1 % paste Use as directed 1 application in the mouth or throat daily. 02/23/15   [provider]  triamcinolone cream (KENALOG) 0.1 % Apply 1 application topically 2 (two) times daily.    [provider]     VITAL SIGNS:  Blood pressure (!) 80/47, pulse (!) 124, temperature 98.7 F (37.1 C), temperature source Oral, resp. rate (!) 21, height 5' 5"  (1.651 m), weight 92.5 kg (204 lb), SpO2 98 %.  PHYSICAL EXAMINATION:  Physical Exam  GENERAL:  62 y.o.-year-old patient lying in the bed, looks critically ill EYES: Pupils equal, round, reactive to light and accommodation. No scleral icterus. Extraocular muscles intact.  HEENT: Head atraumatic, normocephalic. Oropharynx and nasopharynx clear. No oropharyngeal erythema, moist oral mucosa  NECK:  Supple, no jugular venous distention. No thyroid enlargement, no tenderness.  LUNGS: Bilateral wheezing and decreased air entry CARDIOVASCULAR: S1, S2 normal. No murmurs, rubs, or gallops.  ABDOMEN: Soft, nontender, nondistended. Bowel sounds present. No organomegaly or  mass.  EXTREMITIES: No pedal edema, cyanosis, or clubbing. + 2 pedal & radial pulses b/l.   NEUROLOGIC: Cranial nerves II through XII are intact. No focal Motor or sensory deficits appreciated b/l PSYCHIATRIC: The patient is alert and oriented x 3. Good affect.  SKIN: No obvious rash, lesion, or ulcer.   LABORATORY PANEL:   CBC Recent Labs  Lab 11/11/17 1226  WBC 17.1*  HGB 11.9*  HCT 37.3  PLT 242   ------------------------------------------------------------------------------------------------------------------  Chemistries  Recent Labs  Lab 11/11/17 1226  NA 129*  K 4.5  CL 87*  CO2 21*  GLUCOSE 214*  BUN 55*  CREATININE 4.51*  CALCIUM 7.2*  AST 39  ALT 21  ALKPHOS 110  BILITOT 1.1   ------------------------------------------------------------------------------------------------------------------  Cardiac Enzymes Recent Labs  Lab 11/11/17 1226  TROPONINI 0.03*   ------------------------------------------------------------------------------------------------------------------  RADIOLOGY:  Dg Chest Port 1 View  Result Date: 11/11/2017 CLINICAL DATA:  Dyspnea. EXAM: PORTABLE CHEST 1 VIEW COMPARISON:  Radiographs of July 19, 2017 FINDINGS: Stable cardiomediastinal silhouette. Both lungs are clear. No pneumothorax or pleural effusion is noted. The visualized skeletal structures are unremarkable. IMPRESSION: No acute cardiopulmonary abnormality seen. Electronically Signed   By: Marijo Conception, M.D.   On: 11/11/2017 13:14   IMPRESSION AND PLAN:   *UTI with severe sepsis.  Fluid resuscitation as per sepsis protocol.  Repeat lactic acid in 3 hours. Started on IV aztreonam in the emergency room.  Continue.  Wait for urine and blood culture results. Discussed with intensivist Dr. Mortimer Fries.  Admit to stepdown unit.  *Acute kidney injury.  Likely due to severe sepsis and hypotension.  Will check a bladder scan to rule out obstruction.  Check renal ultrasound.  Consult  nephrology if no improvement.  *Diabetes mellitus.  Sliding scale insulin.  *COPD exacerbation -IV steroids, Antibiotics - Scheduled Nebulizers - Inhalers -Wean O2 as tolerated - Consult pulmonary if no improvement   *DVT prophylaxis with heparin  All the records are reviewed and case discussed with ED provider. Management plans discussed with the patient, family and they are in agreement.  CODE STATUS: Full code  TOTAL CC TIME TAKING CARE OF THIS PATIENT: 80 minutes.   Leia Alf Toy Eisemann M.D on 11/11/2017 at 1:46 PM  Between 7am to 6pm - Pager - [phone]  After 6pm go to [email or web address] - password [email or web address] [facility]  Office  [phone]  CC: Primary care physician; Kirk Ruths, MD  Note: This dictation was prepared with Dragon dictation along with smaller phrase technology. Any transcriptional errors that result from this process are unintentional.

## 2017-11-11 NOTE — ED Notes (Signed)
Pt reports not feeling well for the last week. Pt reports DOE and nonproductive cough for 1 week. Pt denies fever, but feels "cold all the time." Pt reports foul smelling urine. +hypotensive and tachycardic. MD Quale at bedside.

## 2017-11-11 NOTE — ED Notes (Signed)
Pt more SOB at this time. MD Quale at bedside to reeval pt. IVF paused at this time. Will continue to monitor.

## 2017-11-11 NOTE — ED Triage Notes (Signed)
Pt to ED from home c/o SOB and hypotension.  Patient states has had non productive cough x1 week, states dyspnea with exertion.  Patient found to be hypotensive in triage, states feels like "i'm going in and out".

## 2017-11-11 NOTE — ED Notes (Signed)
Report called to [employer] and pt updated with plan of care.

## 2017-11-11 NOTE — Progress Notes (Signed)
Pharmacy Antibiotic Note  CURLIE SITTNER is a 62 y.o. female admitted on 11/11/2017 with Urosepsis/?COPD exacerbation.  Pharmacy has been consulted for levofloxacin dosing. Patient received levofloxacin and aztreonam in the ED on 6/26.   Plan: Levofloxacin 547m IV Q48hr with next dose scheduled for 1800 on 6/28.   Height: 5' 5"  (165.1 cm) Weight: 204 lb (92.5 kg) IBW/kg (Calculated) : 57  Temp (24hrs), Avg:98.7 F (37.1 C), Min:98.7 F (37.1 C), Max:98.7 F (37.1 C)  Recent Labs  Lab 11/11/17 1226  WBC 17.1*  CREATININE 4.51*  LATICACIDVEN 4.8*    Estimated Creatinine Clearance: 14.5 mL/min (A) (by C-G formula based on SCr of 4.51 mg/dL (H)).    Allergies  Allergen Reactions  . Iodine Swelling  . Penicillins Swelling    Has patient had a PCN reaction causing immediate rash, facial/tongue/throat swelling, SOB or lightheadedness with hypotension: Yes Has patient had a PCN reaction causing severe rash involving mucus membranes or skin necrosis: Yes Has patient had a PCN reaction that required hospitalization: No Has patient had a PCN reaction occurring within the last 10 years: No If all of the above answers are "NO", then may proceed with Cephalosporin use.     Antimicrobials this admission: 6/26 Aztreonam x 1 6/26 Levofloxacin >>  Dose adjustments this admission: N/A  Microbiology results: 6/26 BCx: pending  6/26 MRSA PCR: pending   Thank you for allowing pharmacy to be a part of this patient's care.  Simpson,Michael L 11/11/2017 3:23 PM

## 2017-11-11 NOTE — ED Notes (Signed)
Attempted to call report but pt has not been assigned a bed yet.

## 2017-11-11 NOTE — ED Notes (Signed)
Attempted to call report and spoke to charge RN for ICU and there are no bed available at this time. ED charge RN aware. Pt updated on plan of care. Will continue to monitor.

## 2017-11-11 NOTE — Consult Note (Signed)
Name: Deanna Gay MRN: [id number] DOB: 12-20-1955    ADMISSION DATE:  11/11/2017 CONSULTATION DATE: 11/11/2017  REFERRING MD : Dr. Darvin Neighbours   CHIEF COMPLAINT: Hypotension   BRIEF PATIENT DESCRIPTION:  62 yo female admitted with acute on chronic respiratory failure secondary to AECOPD, urosepsis, and acute renal failure   SIGNIFICANT EVENTS/STUDIES:  06/26 Pt admitted to stepdown unit  06/26 US Renal showed no explanation for patient's acute renal insufficiency. Specifically, no evidence of urinary obstruction.  HISTORY OF PRESENT ILLNESS:   This is a 62 yo female with a PMH of OSA, Schizophrenia, HTN, GERD, Diabetes Mellitus, Depression, COPD, Cervical Cancer, and Arthritis.  She presented to [facility] ER on 06/26 with shortness of breath, nonproductive cough, malaise, and foul smelling urine onset of symptoms 1 week prior to presentation.  In the ER CXR negative, renal US negative for urinary obstruction, and UA positive for UTI.  Lab results revealed lactic acid 4.8, sodium 129, creatinine 4.51, wbc 17.1, and pt hypotensive.  Therefore, sepsis protocol initiated she received iv abx and fluids.  She was subsequently admitted to the stepdown unit by hospitalist team for further workup and treatment.    PAST MEDICAL HISTORY :   has a past medical history of Arthritis, Cancer ([place]), COPD (chronic obstructive pulmonary disease) ([place]), Depression, Diabetes mellitus without complication ([place]), GERD (gastroesophageal reflux disease), Hypertension, Schizophrenia ([place]), and Sleep apnea.  has a past surgical history that includes Appendectomy; Colonoscopy with propofol (N/A, 12/25/2014); Esophagogastroduodenoscopy (egd) with propofol (N/A, 12/25/2014); Joint replacement; and Total hip arthroplasty (Left, 05/16/2016). Prior to Admission medications   Medication Sig Start Date End Date Taking? Authorizing Provider  aspirin 81 MG chewable tablet Chew 1 tablet (81 mg total) by mouth 2 (two) times  daily. Patient taking differently: Chew 81 mg by mouth daily.  05/18/16  Yes Mcarthur Rossetti, MD  diltiazem (CARDIZEM CD) 180 MG 24 hr capsule Take 180 mg by mouth at bedtime.    Yes [provider]  enalapril (VASOTEC) 20 MG tablet TAKE 1 TABLET BY MOUTH TWICE A DAY 03/06/15  Yes [provider]  hydrochlorothiazide (HYDRODIURIL) 25 MG tablet Take 25 mg by mouth daily.   Yes [provider]  insulin glargine (LANTUS) 100 UNIT/ML injection Inject 20 Units into the skin daily.    Yes [provider]  iron polysaccharides (IFEREX 150) 150 MG capsule Take 1 capsule (150 mg total) by mouth 2 (two) times daily. 06/23/17  Yes Lloyd Huger, MD  metFORMIN (GLUCOPHAGE) 1000 MG tablet Take 1,000 mg by mouth 2 (two) times daily with a meal. 10/08/17  Yes [provider]  methylPREDNISolone (MEDROL DOSEPAK) 4 MG TBPK tablet Take 1 tablet by mouth taper from 4 doses each day to 1 dose and stop.  11/06/17  Yes [provider]  nicotine (NICODERM CQ - DOSED IN MG/24 HOURS) 21 mg/24hr patch Place 1 patch onto the skin daily. 08/07/17  Yes [provider]  omeprazole (PRILOSEC) 20 MG capsule Take 20 mg by mouth 2 (two) times daily before a meal.  03/29/16  Yes [provider]  oxyCODONE-acetaminophen (PERCOCET) 7.5-325 MG tablet Take 1-2 tablets by mouth every 6 (six) hours as needed for severe pain. 05/18/16  Yes Mcarthur Rossetti, MD  risperiDONE (RISPERDAL) 2 MG tablet Take 1 tablet by mouth daily. 08/28/17  Yes [provider]  albuterol (PROVENTIL HFA;VENTOLIN HFA) 108 (90 BASE) MCG/ACT inhaler Inhale 2 puffs into the lungs every 4 (four) hours as needed  for wheezing or shortness of breath.     [provider]  benzonatate (TESSALON) 200 MG capsule Take 1 capsule (200 mg total) by mouth 3 (three) times daily. Patient not taking: Reported on 11/11/2017 07/20/17   Gladstone Lighter, MD  budesonide-formoterol  [facility]) 160-4.5 MCG/ACT inhaler Inhale 2 puffs into the lungs 2 (two) times daily.    [provider]  gabapentin (NEURONTIN) 100 MG capsule Take 1 capsule (100 mg total) by mouth 3 (three) times daily. Patient not taking: Reported on 11/11/2017 05/20/16   Mcarthur Rossetti, MD  guaiFENesin-dextromethorphan [facility] DM) 100-10 MG/5ML syrup Take 5 mLs by mouth every 4 (four) hours as needed for cough. Patient not taking: Reported on 11/11/2017 07/20/17   Gladstone Lighter, MD  insulin lispro (HUMALOG) 100 UNIT/ML injection Please check your sugars before each meal and use lispro per the below sliding scale:  For sugars 151-200: take 2 units For 201-250: take 4units For 251-300: take 6 units For 301-350: take 8 units For 351-400: take 10 units For > 401: call MD Patient not taking: Reported on 11/11/2017 07/20/17   Gladstone Lighter, MD  ipratropium-albuterol (DUONEB) 0.5-2.5 (3) MG/3ML SOLN Take 3 mLs by nebulization every 6 (six) hours as needed.    [provider]  nitroGLYCERIN (NITROSTAT) 0.4 MG SL tablet Place 1 tablet under the tongue every 5 (five) minutes as needed. 08/17/17   [provider]  predniSONE (DELTASONE) 10 MG tablet Take 1 tablet (10 mg total) by mouth daily. 6 tabs PO x 2 days 5 tabs PO x 2 days 4 tabs PO x 2 days 3 tabs PO x 2 days 2 tabs PO x 2 days 1 tab PO x 2 days and stop Patient not taking: Reported on 11/11/2017 07/20/17   Gladstone Lighter, MD  triamcinolone (KENALOG) 0.1 % paste Use as directed 1 application in the mouth or throat daily. 02/23/15   [provider]  triamcinolone cream (KENALOG) 0.1 % Apply 1 application topically 2 (two) times daily.    [provider]   Allergies  Allergen Reactions  . Iodine Swelling  . Penicillins Swelling    Has patient had a PCN reaction causing immediate rash, facial/tongue/throat swelling, SOB or lightheadedness with hypotension: Yes Has patient had a PCN reaction  causing severe rash involving mucus membranes or skin necrosis: Yes Has patient had a PCN reaction that required hospitalization: No Has patient had a PCN reaction occurring within the last 10 years: No If all of the above answers are "NO", then may proceed with Cephalosporin use.     FAMILY HISTORY:  family history includes Diabetes Mellitus II in her mother; Hypertension in her mother. SOCIAL HISTORY:  reports that she has been smoking.  She has a 47.00 pack-year smoking history. She has never used smokeless tobacco. She reports that she does not drink alcohol or use drugs.  REVIEW OF SYSTEMS: Positives in BOLD  Constitutional: fever, chills, weight loss, malaise/fatigue and diaphoresis.  HENT: Negative for hearing loss, ear pain, nosebleeds, congestion, sore throat, neck pain, tinnitus and ear discharge.   Eyes: Negative for blurred vision, double vision, photophobia, pain, discharge and redness.  Respiratory: cough, hemoptysis, sputum production, shortness of breath with exertion, wheezing and stridor.   Cardiovascular: Negative for chest pain, palpitations, orthopnea, claudication, leg swelling and PND.  Gastrointestinal: Negative for heartburn, nausea, vomiting, abdominal pain, diarrhea, constipation, blood in stool and melena.  Genitourinary: malodorous urine, dysuria, urgency, frequency, hematuria and flank pain.  Musculoskeletal: Negative for  myalgias, back pain, joint pain and falls.  Skin: Negative for itching and rash.  Neurological: Negative for dizziness, tingling, tremors, sensory change, speech change, focal weakness, seizures, loss of consciousness, weakness and headaches.  Endo/Heme/Allergies: Negative for environmental allergies and polydipsia. Does not bruise/bleed easily.  SUBJECTIVE:  No complaints at this time   VITAL SIGNS: Temp:  [98 F (36.7 C)-98.7 F (37.1 C)] 98 F (36.7 C) (06/26 2014) Pulse Rate:  [69-124] 106 (06/26 1830) Resp:  [16-31] 20 (06/26  1830) BP: (78-124)/(40-95) 93/62 (06/26 1830) SpO2:  [90 %-99 %] 94 % (06/26 2014) Weight:  [92 kg (202 lb 13.2 oz)-92.5 kg (204 lb)] 92 kg (202 lb 13.2 oz) (06/26 2014)  PHYSICAL EXAMINATION: General: well developed, well nourished female resting in bed, NAD  Neuro: alert and oriented, follows commands  HEENT: supple, no JVD  Cardiovascular: sinus tach, no M/R/G Lungs: clear throughout, even, non labored  Abdomen: +BS x4, soft, obese, non tender, non distended Musculoskeletal: normal bulk and tone, no edema  Skin: intact no rashes or lesions   Recent Labs  Lab 11/11/17 1226  NA 129*  K 4.5  CL 87*  CO2 21*  BUN 55*  CREATININE 4.51*  GLUCOSE 214*   Recent Labs  Lab 11/11/17 1226  HGB 11.9*  HCT 37.3  WBC 17.1*  PLT 242   US Renal  Result Date: 11/11/2017 CLINICAL DATA:  Acute kidney injury. EXAM: RENAL / URINARY TRACT ULTRASOUND COMPLETE COMPARISON:  CT abdomen pelvis-11/28/2016 FINDINGS: Right Kidney: Normal cortical thickness, echogenicity and size, measuring 13.8 cm in length. Note is made of a mild fetal lobulation of the right renal cortex. No focal renal lesions. No echogenic renal stones. No urinary obstruction. Left Kidney: Evaluation of the left kidney is minimally degraded secondary to overlying bowel gas and poor sonographic window. Normal cortical thickness, echogenicity and size, measuring 12.6 cm in length. No focal renal lesions. No echogenic renal stones. No urinary obstruction. Bladder: Decompressed. IMPRESSION: No explanation for patient's acute renal insufficiency. Specifically, no evidence of urinary obstruction. Electronically Signed   By: Sandi Mariscal M.D.   On: 11/11/2017 17:47   Dg Chest Portable 1 View  Result Date: 11/11/2017 CLINICAL DATA:  Shortness of breath. EXAM: PORTABLE CHEST 1 VIEW COMPARISON:  Radiograph of same day. FINDINGS: The heart size and mediastinal contours are within normal limits. Both lungs are clear. No pneumothorax or pleural  effusion is noted. The visualized skeletal structures are unremarkable. IMPRESSION: No acute cardiopulmonary abnormality seen. Electronically Signed   By: Marijo Conception, M.D.   On: 11/11/2017 14:58   Dg Chest Port 1 View  Result Date: 11/11/2017 CLINICAL DATA:  Dyspnea. EXAM: PORTABLE CHEST 1 VIEW COMPARISON:  Radiographs of July 19, 2017 FINDINGS: Stable cardiomediastinal silhouette. Both lungs are clear. No pneumothorax or pleural effusion is noted. The visualized skeletal structures are unremarkable. IMPRESSION: No acute cardiopulmonary abnormality seen. Electronically Signed   By: Marijo Conception, M.D.   On: 11/11/2017 13:14    ASSESSMENT / PLAN:  COPD exacerbation Current everyday smoker  OSA    -Supplemental O2 for dyspnea and/or hypoxia    -Continue iv steroids will start nebulized steroids    -prn bronchodilator therapy     -Smoking cessation counseling provided, nicotine patch ordered   Urosepsis     -Trend WBC and monitor fever curve     -Trend PCT and lactic acid    -Follow cultures     -Continue current abx    -Maintain  map >65 with aggressive fluid resuscitation     -Hold outpatient antihypertensives   Elevated troponin likely secondary to demand ischemia in setting of sepsis      -Trend troponin's     -Continuous telemetry monitoring   Acute renal failure secondary to sepsis       -Trend BMP      -Replace electrolytes as indicated       -Monitor UOP       -NS @75  ml/hr   Diabetes Mellitus        -CBG's ac/hs       -SSI   Marda Stalker, [place] Pager [phone] (please enter 7 digits) PCCM Consult Pager [phone] (please enter 7 digits)

## 2017-11-11 NOTE — Progress Notes (Signed)
Anticoagulation monitoring(Lovenox):  62yo  female ordered Lovenox 40 mg Q24h for DVT prevention  Filed Weights   11/11/17 1204  Weight: 204 lb (92.5 kg)    Lab Results  Component Value Date   CREATININE 4.51 (H) 11/11/2017   CREATININE 0.78 07/20/2017   CREATININE 0.69 07/19/2017   Estimated Creatinine Clearance: 14.5 mL/min (A) (by C-G formula based on SCr of 4.51 mg/dL (H)). Hemoglobin & Hematocrit     Component Value Date/Time   HGB 11.9 (L) 11/11/2017 1226   HGB 12.0 07/21/2013 1338   HCT 37.3 11/11/2017 1226   HCT 36.5 07/21/2013 1338     Per Protocol for Patient with estCrcl severely reduced, will transition to Heparin 5000 units SQ q8h.     Paulina Fusi, PharmD, BCPS 11/11/2017 2:03 PM

## 2017-11-11 NOTE — Progress Notes (Signed)
CODE SEPSIS - PHARMACY COMMUNICATION  **Broad Spectrum Antibiotics should be administered within 1 hour of Sepsis diagnosis**  Time Code Sepsis Called/Page Received: 1231  Antibiotics Ordered: levofloxacin/Aztreonam   Time of 1st antibiotic administration: 1243 (levofloxacin)  Additional action taken by pharmacy: none   If necessary, Name of Provider/Nurse Contacted: N/A    Isaac Lacson L ,PharmD Clinical Pharmacist  11/11/2017  1:29 PM

## 2017-11-12 DIAGNOSIS — J441 Chronic obstructive pulmonary disease with (acute) exacerbation: Secondary | ICD-10-CM

## 2017-11-12 LAB — CBC
HEMATOCRIT: 31.6 % — AB (ref 35.0–47.0)
HEMOGLOBIN: 10.1 g/dL — AB (ref 12.0–16.0)
MCH: 27 pg (ref 26.0–34.0)
MCHC: 32.1 g/dL (ref 32.0–36.0)
MCV: 84 fL (ref 80.0–100.0)
Platelets: 194 10*3/uL (ref 150–440)
RBC: 3.76 MIL/uL — ABNORMAL LOW (ref 3.80–5.20)
RDW: 16 % — ABNORMAL HIGH (ref 11.5–14.5)
WBC: 15.9 10*3/uL — AB (ref 3.6–11.0)

## 2017-11-12 LAB — BASIC METABOLIC PANEL
ANION GAP: 17 — AB (ref 5–15)
BUN: 70 mg/dL — ABNORMAL HIGH (ref 8–23)
CHLORIDE: 94 mmol/L — AB (ref 98–111)
CO2: 18 mmol/L — ABNORMAL LOW (ref 22–32)
Calcium: 5.9 mg/dL — CL (ref 8.9–10.3)
Creatinine, Ser: 4.87 mg/dL — ABNORMAL HIGH (ref 0.44–1.00)
GFR calc Af Amer: 10 mL/min — ABNORMAL LOW (ref >60)
GFR, EST NON AFRICAN AMERICAN: 9 mL/min — AB (ref >60)
Glucose, Bld: 236 mg/dL — ABNORMAL HIGH (ref 70–99)
POTASSIUM: 5 mmol/L (ref 3.5–5.1)
SODIUM: 129 mmol/L — AB (ref 135–145)

## 2017-11-12 LAB — BLOOD CULTURE ID PANEL (REFLEXED)
ACINETOBACTER BAUMANNII: NOT DETECTED
CANDIDA ALBICANS: NOT DETECTED
CANDIDA GLABRATA: NOT DETECTED
CANDIDA KRUSEI: NOT DETECTED
CANDIDA PARAPSILOSIS: NOT DETECTED
CANDIDA TROPICALIS: NOT DETECTED
Carbapenem resistance: NOT DETECTED
ENTEROBACTER CLOACAE COMPLEX: NOT DETECTED
ENTEROBACTERIACEAE SPECIES: DETECTED — AB
Enterococcus species: NOT DETECTED
Escherichia coli: DETECTED — AB
HAEMOPHILUS INFLUENZAE: NOT DETECTED
KLEBSIELLA OXYTOCA: NOT DETECTED
KLEBSIELLA PNEUMONIAE: NOT DETECTED
Listeria monocytogenes: NOT DETECTED
Neisseria meningitidis: NOT DETECTED
PROTEUS SPECIES: NOT DETECTED
PSEUDOMONAS AERUGINOSA: NOT DETECTED
STREPTOCOCCUS PYOGENES: NOT DETECTED
STREPTOCOCCUS SPECIES: NOT DETECTED
Serratia marcescens: NOT DETECTED
Staphylococcus aureus (BCID): NOT DETECTED
Staphylococcus species: NOT DETECTED
Streptococcus agalactiae: NOT DETECTED
Streptococcus pneumoniae: NOT DETECTED

## 2017-11-12 LAB — LACTIC ACID, PLASMA: Lactic Acid, Venous: 1.4 mmol/L (ref 0.5–1.9)

## 2017-11-12 LAB — TROPONIN I: Troponin I: 0.03 ng/mL (ref <0.03)

## 2017-11-12 LAB — ALBUMIN: ALBUMIN: 2.1 g/dL — AB (ref 3.5–5.0)

## 2017-11-12 LAB — GLUCOSE, CAPILLARY
GLUCOSE-CAPILLARY: 248 mg/dL — AB (ref 70–99)
GLUCOSE-CAPILLARY: 207 mg/dL — AB (ref 70–99)
GLUCOSE-CAPILLARY: 260 mg/dL — AB (ref 70–99)
GLUCOSE-CAPILLARY: 237 mg/dL — AB (ref 70–99)
Glucose-Capillary: 211 mg/dL — ABNORMAL HIGH (ref 70–99)

## 2017-11-12 LAB — MAGNESIUM: Magnesium: 1.3 mg/dL — ABNORMAL LOW (ref 1.7–2.4)

## 2017-11-12 LAB — PROCALCITONIN: Procalcitonin: >150 ng/mL

## 2017-11-12 LAB — PHOSPHORUS: PHOSPHORUS: 8.9 mg/dL — AB (ref 2.5–4.6)

## 2017-11-12 MED ORDER — SODIUM CHLORIDE 0.9 % IV SOLN
INTRAVENOUS | Status: DC
  Administered 2017-11-12 – 2017-11-13 (×3): via INTRAVENOUS

## 2017-11-12 MED ORDER — INSULIN GLARGINE 100 UNIT/ML ~~LOC~~ SOLN
10.0000 [IU] | Freq: Every day | SUBCUTANEOUS | Status: DC
  Filled 2017-11-12: qty 0.1

## 2017-11-12 MED ORDER — INSULIN ASPART 100 UNIT/ML ~~LOC~~ SOLN
3.0000 [IU] | Freq: Three times a day (TID) | SUBCUTANEOUS | Status: DC
  Administered 2017-11-13 (×2): 3 [IU] via SUBCUTANEOUS
  Filled 2017-11-12 (×3): qty 1

## 2017-11-12 MED ORDER — MAGNESIUM SULFATE 4 GM/100ML IV SOLN
4.0000 g | Freq: Once | INTRAVENOUS | Status: AC
  Administered 2017-11-12: 4 g via INTRAVENOUS
  Filled 2017-11-12: qty 100

## 2017-11-12 MED ORDER — INSULIN GLARGINE 100 UNIT/ML ~~LOC~~ SOLN
10.0000 [IU] | Freq: Every day | SUBCUTANEOUS | Status: DC
  Administered 2017-11-12 – 2017-11-18 (×7): 10 [IU] via SUBCUTANEOUS
  Filled 2017-11-12 (×9): qty 0.1

## 2017-11-12 MED ORDER — RISPERIDONE 1 MG PO TABS
2.0000 mg | ORAL_TABLET | Freq: Every day | ORAL | Status: DC
  Filled 2017-11-12 (×8): qty 2

## 2017-11-12 MED ORDER — GABAPENTIN 100 MG PO CAPS
100.0000 mg | ORAL_CAPSULE | Freq: Three times a day (TID) | ORAL | Status: DC
Start: 2017-11-12 — End: 2017-11-19
  Administered 2017-11-13 – 2017-11-17 (×2): 100 mg via ORAL
  Filled 2017-11-12 (×11): qty 1

## 2017-11-12 MED ORDER — SODIUM CHLORIDE 0.9 % IV SOLN
1.0000 g | Freq: Once | INTRAVENOUS | Status: AC
  Administered 2017-11-12: 1 g via INTRAVENOUS
  Filled 2017-11-12: qty 10

## 2017-11-12 MED ORDER — SODIUM CHLORIDE 0.9 % IV SOLN
500.0000 mg | Freq: Two times a day (BID) | INTRAVENOUS | Status: DC
  Administered 2017-11-12 – 2017-11-16 (×9): 500 mg via INTRAVENOUS
  Filled 2017-11-12 (×3): qty 500
  Filled 2017-11-12: qty 0.5
  Filled 2017-11-12: qty 500
  Filled 2017-11-12 (×2): qty 0.5
  Filled 2017-11-12 (×5): qty 500

## 2017-11-12 MED ORDER — OXYCODONE-ACETAMINOPHEN 7.5-325 MG PO TABS
1.0000 | ORAL_TABLET | Freq: Four times a day (QID) | ORAL | Status: DC | PRN
  Administered 2017-11-12 – 2017-11-14 (×6): 1 via ORAL
  Administered 2017-11-15 – 2017-11-17 (×2): 2 via ORAL
  Filled 2017-11-12 (×2): qty 1
  Filled 2017-11-12 (×3): qty 2
  Filled 2017-11-12: qty 1
  Filled 2017-11-12: qty 2

## 2017-11-12 NOTE — Progress Notes (Signed)
Report called to Janett Billow, RN on [place].  Patient will be moved to room 207.  Patient has been A&Ox4.  No further complaint of chest pain.  No respiratory distress.

## 2017-11-12 NOTE — Progress Notes (Signed)
ANTIBIOTIC CONSULT NOTE - INITIAL  Pharmacy Consult for Meropenem  Indication: E Coli   Allergies  Allergen Reactions  . Iodine Swelling  . Penicillins Swelling    Has patient had a PCN reaction causing immediate rash, facial/tongue/throat swelling, SOB or lightheadedness with hypotension: Yes Has patient had a PCN reaction causing severe rash involving mucus membranes or skin necrosis: Yes Has patient had a PCN reaction that required hospitalization: No Has patient had a PCN reaction occurring within the last 10 years: No If all of the above answers are "NO", then may proceed with Cephalosporin use.     Patient Measurements: Height: 5' 4"  (162.6 cm) Weight: 202 lb 13.2 oz (92 kg) IBW/kg (Calculated) : 54.7 Adjusted Body Weight:   Vital Signs: Temp: 98 F (36.7 C) (06/26 2014) Temp Source: Oral (06/26 2014) BP: 93/62 (06/26 1830) Pulse Rate: 106 (06/26 1830) Intake/Output from previous day: No intake/output data recorded. Intake/Output from this shift: No intake/output data recorded.  Labs: Recent Labs    11/11/17 1226  WBC 17.1*  HGB 11.9*  PLT 242  CREATININE 4.51*   Estimated Creatinine Clearance: 14.2 mL/min (A) (by C-G formula based on SCr of 4.51 mg/dL (H)). No results for input(s): VANCOTROUGH, VANCOPEAK, VANCORANDOM, GENTTROUGH, GENTPEAK, GENTRANDOM, TOBRATROUGH, TOBRAPEAK, TOBRARND, AMIKACINPEAK, AMIKACINTROU, AMIKACIN in the last 72 hours.   Microbiology: Recent Results (from the past 720 hour(s))  Blood Culture (routine x 2)     Status: None (Preliminary result)   Collection Time: 11/11/17 12:26 PM  Result Value Ref Range Status   Specimen Description BLOOD LEFT ANTECUBITAL  Final   Special Requests   Final    BOTTLES DRAWN AEROBIC AND ANAEROBIC Blood Culture adequate volume   Culture  Setup Time   Final    Organism ID to follow IN BOTH AEROBIC AND ANAEROBIC BOTTLES GRAM NEGATIVE RODS CRITICAL RESULT CALLED TO, READ BACK BY AND VERIFIED WITH: C/  Claudette Wermuth ROBINS @0015  11/12/17 [facility] Performed at [facility], [address] [place], [place] [address]    Culture GRAM NEGATIVE RODS  Final   Report Status PENDING  Incomplete  Blood Culture ID Panel (Reflexed)     Status: Abnormal   Collection Time: 11/11/17 12:26 PM  Result Value Ref Range Status   Enterococcus species NOT DETECTED NOT DETECTED Final   Listeria monocytogenes NOT DETECTED NOT DETECTED Final   Staphylococcus species NOT DETECTED NOT DETECTED Final   Staphylococcus aureus NOT DETECTED NOT DETECTED Final   Streptococcus species NOT DETECTED NOT DETECTED Final   Streptococcus agalactiae NOT DETECTED NOT DETECTED Final   Streptococcus pneumoniae NOT DETECTED NOT DETECTED Final   Streptococcus pyogenes NOT DETECTED NOT DETECTED Final   Acinetobacter baumannii NOT DETECTED NOT DETECTED Final   Enterobacteriaceae species DETECTED (A) NOT DETECTED Final    Comment: Enterobacteriaceae represent a large family of gram-negative bacteria, not a single organism. C/ Sevrin Sally ROBINS @0015  11/12/17 F[facility]    Enterobacter cloacae complex NOT DETECTED NOT DETECTED Final   Escherichia coli DETECTED (A) NOT DETECTED Final    Comment: C/ Malikiah Debarr ROBINS @0015  11/12/17 [facility]   Klebsiella oxytoca NOT DETECTED NOT DETECTED Final   Klebsiella pneumoniae NOT DETECTED NOT DETECTED Final   Proteus species NOT DETECTED NOT DETECTED Final   Serratia marcescens NOT DETECTED NOT DETECTED Final   Carbapenem resistance NOT DETECTED NOT DETECTED Final   Haemophilus influenzae NOT DETECTED NOT DETECTED Final   Neisseria meningitidis NOT DETECTED NOT DETECTED Final   Pseudomonas aeruginosa NOT DETECTED NOT DETECTED  Final   Candida albicans NOT DETECTED NOT DETECTED Final   Candida glabrata NOT DETECTED NOT DETECTED Final   Candida krusei NOT DETECTED NOT DETECTED Final   Candida parapsilosis NOT DETECTED NOT DETECTED Final   Candida tropicalis NOT DETECTED NOT DETECTED Final    Comment: Performed at  [facility], [address] [place], [place] [address]  Blood Culture (routine x 2)     Status: None (Preliminary result)   Collection Time: 11/11/17 12:31 PM  Result Value Ref Range Status   Specimen Description BLOOD RIGHT ANTECUBITAL  Final   Special Requests   Final    BOTTLES DRAWN AEROBIC AND ANAEROBIC Blood Culture results may not be optimal due to an excessive volume of blood received in culture bottles   Culture  Setup Time   Final    IN BOTH AEROBIC AND ANAEROBIC BOTTLES GRAM NEGATIVE RODS CRITICAL VALUE NOTED.  VALUE IS CONSISTENT WITH PREVIOUSLY REPORTED AND CALLED VALUE. Performed at [facility], [address] [place], [place] [address]    Culture GRAM NEGATIVE RODS  Final   Report Status PENDING  Incomplete  MRSA PCR Screening     Status: None   Collection Time: 11/11/17  8:28 PM  Result Value Ref Range Status   MRSA by PCR NEGATIVE NEGATIVE Final    Comment:        The GeneXpert MRSA Assay (FDA approved for NASAL specimens only), is one component of a comprehensive MRSA colonization surveillance program. It is not intended to diagnose MRSA infection nor to guide or monitor treatment for MRSA infections. Performed at [facility], [address] [place], [place] [address]     Medical History: Past Medical History:  Diagnosis Date  . Arthritis   . Cancer ([place])    cervical  . COPD (chronic obstructive pulmonary disease) ([place])   . Depression   . Diabetes mellitus without complication ([place])   . GERD (gastroesophageal reflux disease)   . Hypertension   . Schizophrenia ([place])   . Sleep apnea     Medications:  Medications Prior to Admission  Medication Sig Dispense Refill Last Dose  . aspirin 81 MG chewable tablet Chew 1 tablet (81 mg total) by mouth 2 (two) times daily. (Patient taking differently: Chew 81 mg by mouth daily. ) 60 tablet 0 Past Week at Unknown time  . diltiazem (CARDIZEM CD) 180 MG 24 hr capsule Take 180 mg  by mouth at bedtime.    Past Week at Unknown time  . enalapril (VASOTEC) 20 MG tablet TAKE 1 TABLET BY MOUTH TWICE A DAY   Past Week at Unknown time  . hydrochlorothiazide (HYDRODIURIL) 25 MG tablet Take 25 mg by mouth daily.   Past Week at Unknown time  . insulin glargine (LANTUS) 100 UNIT/ML injection Inject 20 Units into the skin daily.    Past Week at Unknown time  . iron polysaccharides (IFEREX 150) 150 MG capsule Take 1 capsule (150 mg total) by mouth 2 (two) times daily. 200 capsule 1 Past Week at Unknown time  . metFORMIN (GLUCOPHAGE) 1000 MG tablet Take 1,000 mg by mouth 2 (two) times daily with a meal.  0 Past Week at Unknown time  . methylPREDNISolone (MEDROL DOSEPAK) 4 MG TBPK tablet Take 1 tablet by mouth taper from 4 doses each day to 1 dose and stop.   0 Past Week at Unknown time  . nicotine (NICODERM CQ - DOSED IN MG/24 HOURS) 21 mg/24hr patch Place 1 patch  onto the skin daily.  0 Past Week at Unknown time  . omeprazole (PRILOSEC) 20 MG capsule Take 20 mg by mouth 2 (two) times daily before a meal.   0 Past Week at Unknown time  . oxyCODONE-acetaminophen (PERCOCET) 7.5-325 MG tablet Take 1-2 tablets by mouth every 6 (six) hours as needed for severe pain. 30 tablet 0 Past Week at Unknown time  . risperiDONE (RISPERDAL) 2 MG tablet Take 1 tablet by mouth daily.  0 Past Week at Unknown time  . albuterol (PROVENTIL HFA;VENTOLIN HFA) 108 (90 BASE) MCG/ACT inhaler Inhale 2 puffs into the lungs every 4 (four) hours as needed for wheezing or shortness of breath.    prn at prn  . benzonatate (TESSALON) 200 MG capsule Take 1 capsule (200 mg total) by mouth 3 (three) times daily. (Patient not taking: Reported on 11/11/2017) 20 capsule 0 Not Taking at Unknown time  . budesonide-formoterol (SYMBICORT) 160-4.5 MCG/ACT inhaler Inhale 2 puffs into the lungs 2 (two) times daily.   prn at prn  . gabapentin (NEURONTIN) 100 MG capsule Take 1 capsule (100 mg total) by mouth 3 (three) times daily. (Patient  not taking: Reported on 11/11/2017) 60 capsule 0 Not Taking at Unknown time  . guaiFENesin-dextromethorphan (ROBITUSSIN DM) 100-10 MG/5ML syrup Take 5 mLs by mouth every 4 (four) hours as needed for cough. (Patient not taking: Reported on 11/11/2017) 118 mL 0 Not Taking at Unknown time  . insulin lispro (HUMALOG) 100 UNIT/ML injection Please check your sugars before each meal and use lispro per the below sliding scale:  For sugars 151-200: take 2 units For 201-250: take 4units For 251-300: take 6 units For 301-350: take 8 units For 351-400: take 10 units For > 401: call MD (Patient not taking: Reported on 11/11/2017) 10 mL 11 Not Taking at Unknown time  . ipratropium-albuterol (DUONEB) 0.5-2.5 (3) MG/3ML SOLN Take 3 mLs by nebulization every 6 (six) hours as needed.   PRN at PRN  . nitroGLYCERIN (NITROSTAT) 0.4 MG SL tablet Place 1 tablet under the tongue every 5 (five) minutes as needed.  0 PRN at PRN  . predniSONE (DELTASONE) 10 MG tablet Take 1 tablet (10 mg total) by mouth daily. 6 tabs PO x 2 days 5 tabs PO x 2 days 4 tabs PO x 2 days 3 tabs PO x 2 days 2 tabs PO x 2 days 1 tab PO x 2 days and stop (Patient not taking: Reported on 11/11/2017) 42 tablet 0 Not Taking at Unknown time  . triamcinolone (KENALOG) 0.1 % paste Use as directed 1 application in the mouth or throat daily.  1 PRN at PRN  . triamcinolone cream (KENALOG) 0.1 % Apply 1 application topically 2 (two) times daily.   PRN at PRN   Assessment: Pt growing E Coli in 4 of 4 bottles , KPC not detected. CrCl = 14.2 ml/min   Goal of Therapy:  resolution of infection  Plan:  Expected duration 7 days with resolution of temperature and/or normalization of WBC   Will start Meropenem 500 mg IV Q12H on 6/27 @ 0100.   Tayron Hunnell D 11/12/2017,1:05 AM

## 2017-11-12 NOTE — Progress Notes (Signed)
Inpatient Diabetes Program Recommendations  AACE/ADA: New Consensus Statement on Inpatient Glycemic Control (2019)  Target Ranges:  Prepandial:   less than 140 mg/dL      Peak postprandial:   less than 180 mg/dL (1-2 hours)      Critically ill patients:  140 - 180 mg/dL   Results for ADI, SEALES (MRN [id number]) as of 11/12/2017 08:22  Ref. Range 11/11/2017 21:35 11/12/2017 07:38  Glucose-Capillary Latest Ref Range: 70 - 99 mg/dL 267 (H) 207 (H)  Results for LENNI, RECKNER (MRN [id number]) as of 11/12/2017 08:22  Ref. Range 11/11/2017 13:45  Hemoglobin A1C Latest Ref Range: 4.8 - 5.6 % 7.6 (H)   Review of Glycemic Control  Diabetes history: DM2 Outpatient Diabetes medications: Lantus 20 units daily, Metformin 1000 mg BID, Humalog 0-10 units TID with meals Current orders for Inpatient glycemic control: Novolog 0-15 units TID with meals, Novolog 0-5 units QHS; Solumedrol 60 mg Q12H  Inpatient Diabetes Program Recommendations: Insulin - Basal: Please consider ordering Lantus 10 units Q24H. Insulin - Meal Coverage: If steroids are continued, please consider ordering Novolog 3 units TID with meals for meal coverage if patient eats at least 50% of meals.  Thanks, Barnie Alderman, RN, MSN, CDE Diabetes Coordinator Inpatient Diabetes Program [phone] (Team Pager from 8am to 5pm)

## 2017-11-12 NOTE — Consult Note (Signed)
[facility]  CONSULT NOTE    Date: 11/12/2017                  Patient Name:  Deanna Gay  MRN: [id number]  DOB: 03-21-1956  Age / Sex: 62 y.o., female         PCP: Kirk Ruths, MD                 Service Requesting Consult: Acute renal failure                 Reason for Consult: Dr. Mortimer Fries            History of Present Illness: Deanna Gay is a 62 y.o. black female with COPD, tobacco abuse, depression, GERD, diabetes mellitus type II, schizophrenia, hypertension, obstructive sleep apnea, anemia who was admitted to [facility] on 11/11/2017 for Severe dehydration [E86.0] AKI (acute kidney injury) ([place]) [N17.9] COPD with acute exacerbation ([place]) [J44.1] Acute kidney injury ([place]) [N17.9] Septic shock ([place]) [A41.9, R65.21] Severe sepsis ([place]) [A41.9, R65.20]     Medications: Outpatient medications: Medications Prior to Admission  Medication Sig Dispense Refill Last Dose  . aspirin 81 MG chewable tablet Chew 1 tablet (81 mg total) by mouth 2 (two) times daily. (Patient taking differently: Chew 81 mg by mouth daily. ) 60 tablet 0 Past Week at Unknown time  . diltiazem (CARDIZEM CD) 180 MG 24 hr capsule Take 180 mg by mouth at bedtime.    Past Week at Unknown time  . enalapril (VASOTEC) 20 MG tablet TAKE 1 TABLET BY MOUTH TWICE A DAY   Past Week at Unknown time  . hydrochlorothiazide (HYDRODIURIL) 25 MG tablet Take 25 mg by mouth daily.   Past Week at Unknown time  . insulin glargine (LANTUS) 100 UNIT/ML injection Inject 20 Units into the skin daily.    Past Week at Unknown time  . iron polysaccharides (IFEREX 150) 150 MG capsule Take 1 capsule (150 mg total) by mouth 2 (two) times daily. 200 capsule 1 Past Week at Unknown time  . metFORMIN (GLUCOPHAGE) 1000 MG tablet Take 1,000 mg by mouth 2 (two) times daily with a meal.  0 Past Week at Unknown time  . methylPREDNISolone (MEDROL DOSEPAK) 4 MG TBPK tablet Take 1 tablet by mouth taper from 4  doses each day to 1 dose and stop.   0 Past Week at Unknown time  . nicotine (NICODERM CQ - DOSED IN MG/24 HOURS) 21 mg/24hr patch Place 1 patch onto the skin daily.  0 Past Week at Unknown time  . omeprazole (PRILOSEC) 20 MG capsule Take 20 mg by mouth 2 (two) times daily before a meal.   0 Past Week at Unknown time  . oxyCODONE-acetaminophen (PERCOCET) 7.5-325 MG tablet Take 1-2 tablets by mouth every 6 (six) hours as needed for severe pain. 30 tablet 0 Past Week at Unknown time  . albuterol (PROVENTIL HFA;VENTOLIN HFA) 108 (90 BASE) MCG/ACT inhaler Inhale 2 puffs into the lungs every 4 (four) hours as needed for wheezing or shortness of breath.    prn at prn  . benzonatate (TESSALON) 200 MG capsule Take 1 capsule (200 mg total) by mouth 3 (three) times daily. (Patient not taking: Reported on 11/11/2017) 20 capsule 0 Not Taking at Unknown time  . budesonide-formoterol (SYMBICORT) 160-4.5 MCG/ACT inhaler Inhale 2 puffs into the lungs 2 (two) times daily.   prn at prn  . guaiFENesin-dextromethorphan (ROBITUSSIN DM) 100-10 MG/5ML syrup Take 5  mLs by mouth every 4 (four) hours as needed for cough. (Patient not taking: Reported on 11/11/2017) 118 mL 0 Not Taking at Unknown time  . insulin lispro (HUMALOG) 100 UNIT/ML injection Please check your sugars before each meal and use lispro per the below sliding scale:  For sugars 151-200: take 2 units For 201-250: take 4units For 251-300: take 6 units For 301-350: take 8 units For 351-400: take 10 units For > 401: call MD (Patient not taking: Reported on 11/11/2017) 10 mL 11 Not Taking at Unknown time  . ipratropium-albuterol (DUONEB) 0.5-2.5 (3) MG/3ML SOLN Take 3 mLs by nebulization every 6 (six) hours as needed.   PRN at PRN  . nitroGLYCERIN (NITROSTAT) 0.4 MG SL tablet Place 1 tablet under the tongue every 5 (five) minutes as needed.  0 PRN at PRN  . predniSONE (DELTASONE) 10 MG tablet Take 1 tablet (10 mg total) by mouth daily. 6 tabs PO x 2 days 5 tabs  PO x 2 days 4 tabs PO x 2 days 3 tabs PO x 2 days 2 tabs PO x 2 days 1 tab PO x 2 days and stop (Patient not taking: Reported on 11/11/2017) 42 tablet 0 Not Taking at Unknown time  . triamcinolone (KENALOG) 0.1 % paste Use as directed 1 application in the mouth or throat daily.  1 PRN at PRN  . triamcinolone cream (KENALOG) 0.1 % Apply 1 application topically 2 (two) times daily.   PRN at PRN    Current medications: Current Facility-Administered Medications  Medication Dose Route Frequency Provider Last Rate Last Dose  . 0.9 %  sodium chloride infusion   Intravenous Continuous Awilda Bill, NP 100 mL/hr at 11/12/17 0630    . acetaminophen (TYLENOL) tablet 650 mg  650 mg Oral Q6H PRN Hillary Bow, MD   650 mg at 11/12/17 6734   Or  . acetaminophen (TYLENOL) suppository 650 mg  650 mg Rectal Q6H PRN Sudini, Alveta Heimlich, MD      . albuterol (PROVENTIL) (2.5 MG/3ML) 0.083% nebulizer solution 2.5 mg  2.5 mg Nebulization Q2H PRN Sudini, Srikar, MD      . budesonide (PULMICORT) nebulizer solution 0.25 mg  0.25 mg Nebulization BID Awilda Bill, NP   0.25 mg at 11/12/17 0834  . gabapentin (NEURONTIN) capsule 100 mg  100 mg Oral TID Flora Lipps, MD      . heparin injection 5,000 Units  5,000 Units Subcutaneous Q8H Hillary Bow, MD   5,000 Units at 11/12/17 1414  . insulin aspart (novoLOG) injection 0-15 Units  0-15 Units Subcutaneous TID WC Hillary Bow, MD   5 Units at 11/12/17 1128  . insulin aspart (novoLOG) injection 0-5 Units  0-5 Units Subcutaneous QHS Hillary Bow, MD   3 Units at 11/11/17 2141  . insulin glargine (LANTUS) injection 10 Units  10 Units Subcutaneous QHS Demetrios Loll, MD      . ipratropium-albuterol (DUONEB) 0.5-2.5 (3) MG/3ML nebulizer solution 3 mL  3 mL Nebulization Q6H Awilda Bill, NP   3 mL at 11/12/17 0834  . meropenem (MERREM) 500 mg in sodium chloride 0.9 % 100 mL IVPB  500 mg Intravenous Q12H Awilda Bill, NP 200 mL/hr at 11/12/17 1414 500 mg at 11/12/17  1414  . methylPREDNISolone sodium succinate (SOLU-MEDROL) 125 mg/2 mL injection 60 mg  60 mg Intravenous Q12H Hillary Bow, MD   60 mg at 11/12/17 0533  . nicotine (NICODERM CQ - dosed in mg/24 hours) patch 14 mg  14 mg  Transdermal Daily Awilda Bill, NP   14 mg at 11/12/17 1016  . ondansetron (ZOFRAN) tablet 4 mg  4 mg Oral Q6H PRN Hillary Bow, MD       Or  . ondansetron (ZOFRAN) injection 4 mg  4 mg Intravenous Q6H PRN Hillary Bow, MD   4 mg at 11/12/17 0311  . oxyCODONE-acetaminophen (PERCOCET) 7.5-325 MG per tablet 1-2 tablet  1-2 tablet Oral Q6H PRN Flora Lipps, MD   1 tablet at 11/12/17 1128  . polyethylene glycol (MIRALAX / GLYCOLAX) packet 17 g  17 g Oral Daily PRN Sudini, Alveta Heimlich, MD      . risperiDONE (RISPERDAL) tablet 2 mg  2 mg Oral Daily Kasa, Kurian, MD      . traMADol (ULTRAM) tablet 50 mg  50 mg Oral Q6H PRN Hillary Bow, MD          Allergies: Allergies  Allergen Reactions  . Iodine Swelling  . Penicillins Swelling    Has patient had a PCN reaction causing immediate rash, facial/tongue/throat swelling, SOB or lightheadedness with hypotension: Yes Has patient had a PCN reaction causing severe rash involving mucus membranes or skin necrosis: Yes Has patient had a PCN reaction that required hospitalization: No Has patient had a PCN reaction occurring within the last 10 years: No If all of the above answers are "NO", then may proceed with Cephalosporin use.       Past Medical History: Past Medical History:  Diagnosis Date  . Arthritis   . Cancer ([place])    cervical  . COPD (chronic obstructive pulmonary disease) ([place])   . Depression   . Diabetes mellitus without complication ([place])   . GERD (gastroesophageal reflux disease)   . Hypertension   . Schizophrenia ([place])   . Sleep apnea      Past Surgical History: Past Surgical History:  Procedure Laterality Date  . APPENDECTOMY    . COLONOSCOPY WITH PROPOFOL N/A 12/25/2014   Procedure: COLONOSCOPY WITH  PROPOFOL;  Surgeon: Josefine Class, MD;  Location: [facility] ENDOSCOPY;  Service: Endoscopy;  Laterality: N/A;  . ESOPHAGOGASTRODUODENOSCOPY (EGD) WITH PROPOFOL N/A 12/25/2014   Procedure: ESOPHAGOGASTRODUODENOSCOPY (EGD) WITH PROPOFOL;  Surgeon: Josefine Class, MD;  Location: [facility] ENDOSCOPY;  Service: Endoscopy;  Laterality: N/A;  . JOINT REPLACEMENT     right hip  . TOTAL HIP ARTHROPLASTY Left 05/16/2016   Procedure: LEFT TOTAL HIP ARTHROPLASTY ANTERIOR APPROACH;  Surgeon: Mcarthur Rossetti, MD;  Location: [facility] ORS;  Service: Orthopedics;  Laterality: Left;     Family History: Family History  Problem Relation Age of Onset  . Hypertension Mother   . Diabetes Mellitus II Mother   . Breast cancer Neg Hx   . Kidney cancer Neg Hx   . Bladder Cancer Neg Hx      Social History: Social History   Socioeconomic History  . Marital status: Divorced    Spouse name: Not on file  . Number of children: Not on file  . Years of education: Not on file  . Highest education level: Not on file  Occupational History  . Not on file  Social Needs  . Financial resource strain: Not on file  . Food insecurity:    Worry: Not on file    Inability: Not on file  . Transportation needs:    Medical: Not on file    Non-medical: Not on file  Tobacco Use  . Smoking status: Current Every Day Smoker    Packs/day: 1.00    Years:  47.00    Pack years: 47.00  . Smokeless tobacco: Never Used  Substance and Sexual Activity  . Alcohol use: No  . Drug use: No  . Sexual activity: Not on file  Lifestyle  . Physical activity:    Days per week: Not on file    Minutes per session: Not on file  . Stress: Not on file  Relationships  . Social connections:    Talks on phone: Not on file    Gets together: Not on file    Attends religious service: Not on file    Active member of club or organization: Not on file    Attends meetings of clubs or organizations: Not on file    Relationship status: Not on  file  . Intimate partner violence:    Fear of current or ex partner: Not on file    Emotionally abused: Not on file    Physically abused: Not on file    Forced sexual activity: Not on file  Other Topics Concern  . Not on file  Social History Narrative  . Not on file     Review of Systems: Review of Systems  Constitutional: Negative.  Negative for chills, diaphoresis, fever, malaise/fatigue and weight loss.  HENT: Negative.  Negative for congestion, ear discharge, ear pain, hearing loss, nosebleeds, sinus pain, sore throat and tinnitus.   Eyes: Negative.  Negative for blurred vision, double vision, photophobia, pain, discharge and redness.  Respiratory: Negative.  Negative for cough, hemoptysis, sputum production, shortness of breath, wheezing and stridor.   Cardiovascular: Negative.  Negative for chest pain, palpitations, orthopnea, claudication, leg swelling and PND.  Gastrointestinal: Negative.  Negative for abdominal pain, blood in stool, constipation, diarrhea, heartburn, melena, nausea and vomiting.  Genitourinary: Negative.  Negative for dysuria, flank pain, frequency, hematuria and urgency.  Musculoskeletal: Negative.  Negative for back pain, falls, joint pain, myalgias and neck pain.  Skin: Negative.  Negative for itching and rash.  Neurological: Negative.  Negative for dizziness, tingling, tremors, sensory change, speech change, focal weakness, seizures, loss of consciousness, weakness and headaches.  Endo/Heme/Allergies: Negative.  Negative for environmental allergies and polydipsia. Does not bruise/bleed easily.  Psychiatric/Behavioral: Negative.  Negative for depression, hallucinations, memory loss, substance abuse and suicidal ideas. The patient is not nervous/anxious and does not have insomnia.     Vital Signs: Blood pressure 123/74, pulse 92, temperature (!) 97 F (36.1 C), temperature source Axillary, resp. rate 17, height 5' 4"  (1.626 m), weight 92 kg (202 lb 13.2 oz),  SpO2 95 %.  Weight trends: Filed Weights   11/11/17 1204 11/11/17 2014  Weight: 92.5 kg (204 lb) 92 kg (202 lb 13.2 oz)    Physical Exam: General: NAD,   Head: Normocephalic, atraumatic. Moist oral mucosal membranes  Eyes: Anicteric, PERRL  Neck: Supple, trachea midline  Lungs:  Clear to auscultation  Heart: Regular rate and rhythm  Abdomen:  Soft, nontender,   Extremities: no peripheral edema.  Neurologic: Nonfocal, moving all four extremities  Skin: No lesions         Lab results: Basic Metabolic Panel: Recent Labs  Lab 11/11/17 1226 11/12/17 0355  NA 129* 129*  K 4.5 5.0  CL 87* 94*  CO2 21* 18*  GLUCOSE 214* 236*  BUN 55* 70*  CREATININE 4.51* 4.87*  CALCIUM 7.2* 5.9*  MG  --  1.3*  PHOS  --  8.9*    Liver Function Tests: Recent Labs  Lab 11/11/17 1226 11/12/17 0355  AST 39  --  ALT 21  --   ALKPHOS 110  --   BILITOT 1.1  --   PROT 7.5  --   ALBUMIN 2.9* 2.1*   Recent Labs  Lab 11/11/17 1226  LIPASE 16   No results for input(s): AMMONIA in the last 168 hours.  CBC: Recent Labs  Lab 11/11/17 1226 11/12/17 0355  WBC 17.1* 15.9*  NEUTROABS 16.4*  --   HGB 11.9* 10.1*  HCT 37.3 31.6*  MCV 83.8 84.0  PLT 242 194    Cardiac Enzymes: Recent Labs  Lab 11/11/17 1226 11/11/17 1402 11/11/17 2048 11/12/17 0355 11/12/17 1044  CKTOTAL  --  36*  --   --   --   TROPONINI 0.03*  --  <0.03 <0.03 0.03*    BNP: Invalid input(s): POCBNP  CBG: Recent Labs  Lab 11/11/17 2018 11/11/17 2135 11/12/17 0738 11/12/17 1124  GLUCAP 248* 267* 207* 98*    Microbiology: Results for orders placed or performed during the hospital encounter of 11/11/17  Blood Culture (routine x 2)     Status: None (Preliminary result)   Collection Time: 11/11/17 12:26 PM  Result Value Ref Range Status   Specimen Description BLOOD LEFT ANTECUBITAL  Final   Special Requests   Final    BOTTLES DRAWN AEROBIC AND ANAEROBIC Blood Culture adequate volume   Culture   Setup Time   Final    Organism ID to follow IN BOTH AEROBIC AND ANAEROBIC BOTTLES GRAM NEGATIVE RODS CRITICAL RESULT CALLED TO, READ BACK BY AND VERIFIED WITH: C/ JASON ROBINS @0015  11/12/17 FLC Performed at [facility], [address] [place], [place] [address]    Culture GRAM NEGATIVE RODS  Final   Report Status PENDING  Incomplete  Blood Culture ID Panel (Reflexed)     Status: Abnormal   Collection Time: 11/11/17 12:26 PM  Result Value Ref Range Status   Enterococcus species NOT DETECTED NOT DETECTED Final   Listeria monocytogenes NOT DETECTED NOT DETECTED Final   Staphylococcus species NOT DETECTED NOT DETECTED Final   Staphylococcus aureus NOT DETECTED NOT DETECTED Final   Streptococcus species NOT DETECTED NOT DETECTED Final   Streptococcus agalactiae NOT DETECTED NOT DETECTED Final   Streptococcus pneumoniae NOT DETECTED NOT DETECTED Final   Streptococcus pyogenes NOT DETECTED NOT DETECTED Final   Acinetobacter baumannii NOT DETECTED NOT DETECTED Final   Enterobacteriaceae species DETECTED (A) NOT DETECTED Final    Comment: Enterobacteriaceae represent a large family of gram-negative bacteria, not a single organism. C/ JASON ROBINS @0015  11/12/17 FLC    Enterobacter cloacae complex NOT DETECTED NOT DETECTED Final   Escherichia coli DETECTED (A) NOT DETECTED Final    Comment: C/ JASON ROBINS @0015  11/12/17 [facility]LC   Klebsiella oxytoca NOT DETECTED NOT DETECTED Final   Klebsiella pneumoniae NOT DETECTED NOT DETECTED Final   Proteus species NOT DETECTED NOT DETECTED Final   Serratia marcescens NOT DETECTED NOT DETECTED Final   Carbapenem resistance NOT DETECTED NOT DETECTED Final   Haemophilus influenzae NOT DETECTED NOT DETECTED Final   Neisseria meningitidis NOT DETECTED NOT DETECTED Final   Pseudomonas aeruginosa NOT DETECTED NOT DETECTED Final   Candida albicans NOT DETECTED NOT DETECTED Final   Candida glabrata NOT DETECTED NOT DETECTED Final   Candida krusei NOT  DETECTED NOT DETECTED Final   Candida parapsilosis NOT DETECTED NOT DETECTED Final   Candida tropicalis NOT DETECTED NOT DETECTED Final    Comment: Performed at [facility], [address], [place], [place] [address]  Blood  Culture (routine x 2)     Status: None (Preliminary result)   Collection Time: 11/11/17 12:31 PM  Result Value Ref Range Status   Specimen Description BLOOD RIGHT ANTECUBITAL  Final   Special Requests   Final    BOTTLES DRAWN AEROBIC AND ANAEROBIC Blood Culture results may not be optimal due to an excessive volume of blood received in culture bottles   Culture  Setup Time   Final    IN BOTH AEROBIC AND ANAEROBIC BOTTLES GRAM NEGATIVE RODS CRITICAL VALUE NOTED.  VALUE IS CONSISTENT WITH PREVIOUSLY REPORTED AND CALLED VALUE. Performed at [facility], [address] [place], [place] [address]    Culture GRAM NEGATIVE RODS  Final   Report Status PENDING  Incomplete  MRSA PCR Screening     Status: None   Collection Time: 11/11/17  8:28 PM  Result Value Ref Range Status   MRSA by PCR NEGATIVE NEGATIVE Final    Comment:        The GeneXpert MRSA Assay (FDA approved for NASAL specimens only), is one component of a comprehensive MRSA colonization surveillance program. It is not intended to diagnose MRSA infection nor to guide or monitor treatment for MRSA infections. Performed at [facility], [address] [place], [place] [address]     Coagulation Studies: No results for input(s): LABPROT, INR in the last 72 hours.  Urinalysis: Recent Labs    11/11/17 1226  COLORURINE AMBER*  LABSPEC 1.017  PHURINE 5.0  GLUCOSEU NEGATIVE  HGBUR MODERATE*  BILIRUBINUR NEGATIVE  KETONESUR NEGATIVE  PROTEINUR 100*  NITRITE NEGATIVE  LEUKOCYTESUR MODERATE*      Imaging: US Renal  Result Date: 11/11/2017 CLINICAL DATA:  Acute kidney injury. EXAM: RENAL / URINARY TRACT ULTRASOUND COMPLETE COMPARISON:  CT abdomen pelvis-11/28/2016  FINDINGS: Right Kidney: Normal cortical thickness, echogenicity and size, measuring 13.8 cm in length. Note is made of a mild fetal lobulation of the right renal cortex. No focal renal lesions. No echogenic renal stones. No urinary obstruction. Left Kidney: Evaluation of the left kidney is minimally degraded secondary to overlying bowel gas and poor sonographic window. Normal cortical thickness, echogenicity and size, measuring 12.6 cm in length. No focal renal lesions. No echogenic renal stones. No urinary obstruction. Bladder: Decompressed. IMPRESSION: No explanation for patient's acute renal insufficiency. Specifically, no evidence of urinary obstruction. Electronically Signed   By: Sandi Mariscal M.D.   On: 11/11/2017 17:47   Dg Chest Portable 1 View  Result Date: 11/11/2017 CLINICAL DATA:  Shortness of breath. EXAM: PORTABLE CHEST 1 VIEW COMPARISON:  Radiograph of same day. FINDINGS: The heart size and mediastinal contours are within normal limits. Both lungs are clear. No pneumothorax or pleural effusion is noted. The visualized skeletal structures are unremarkable. IMPRESSION: No acute cardiopulmonary abnormality seen. Electronically Signed   By: Marijo Conception, M.D.   On: 11/11/2017 14:58   Dg Chest Port 1 View  Result Date: 11/11/2017 CLINICAL DATA:  Dyspnea. EXAM: PORTABLE CHEST 1 VIEW COMPARISON:  Radiographs of July 19, 2017 FINDINGS: Stable cardiomediastinal silhouette. Both lungs are clear. No pneumothorax or pleural effusion is noted. The visualized skeletal structures are unremarkable. IMPRESSION: No acute cardiopulmonary abnormality seen. Electronically Signed   By: Marijo Conception, M.D.   On: 11/11/2017 13:14      Assessment & Plan: Ms. SRAH AKE is a 62 y.o. black female with COPD, tobacco abuse, depression, GERD, diabetes mellitus type II, schizophrenia, hypertension, obstructive sleep apnea, anemia who was admitted  to [facility] on 11/11/2017 for Severe dehydration [E86.0] AKI  (acute kidney injury) ([place]) [N17.9] COPD with acute exacerbation ([place]) [J44.1] Acute kidney injury ([place]) [N17.9] Septic shock ([place]) [A41.9, R65.21] Severe sepsis ([place]) [A41.9, R65.20]  1. Acute renal failure with metabolic acidosis, hyponatremia, hypocalcemia, : baseline creatinine of 0.8 on 10/08/17.  Ultrasound negative.  Urinalysis with hematuria and proteinuria.   2. Urinary tract infection with sepsis and bacteremia: E. Coli on blood and urine cultures 6/26.  - empiric meropenem.   3. 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 Not well controlled.  hemoglobin A1c of 8.2% on 10/08/17.   4. Anemia with renal failure: hemoglobin 10.1      LOS: 1 Erastus Bartolomei 6/27/20192:44 PM

## 2017-11-12 NOTE — Progress Notes (Signed)
During rounds RN made Dr. Mortimer Fries aware that patient's most recent troponin is 0.04, MD acknowledged.  RN discussed poor urine output with MD.  Dr. Mortimer Fries gave order to transfer patient to any med surg without tele and to consult nephrology.

## 2017-11-12 NOTE — Progress Notes (Signed)
Pharmacy Antibiotic Note  Deanna Gay is a 62 y.o. female admitted on 11/11/2017 with Urosepsis/Bacteremia.  Pharmacy has been consulted for meropenem dosing. Patient received levofloxacin and aztreonam in the ED on 6/26.   Plan: Meropenem 570m IV Q12hr. Will follow renal function in the setting of acute renal function. Will continue to follow cultures daily.   Height: 5' 4"  (162.6 cm) Weight: 202 lb 13.2 oz (92 kg) IBW/kg (Calculated) : 54.7  Temp (24hrs), Avg:97.5 F (36.4 C), Min:97 F (36.1 C), Max:98 F (36.7 C)  Recent Labs  Lab 11/11/17 1226 11/11/17 1554 11/11/17 2048 11/12/17 0355 11/12/17 0629  WBC 17.1*  --   --  15.9*  --   CREATININE 4.51*  --   --  4.87*  --   LATICACIDVEN 4.8* 4.9* 3.3*  --  1.4    Estimated Creatinine Clearance: 13.2 mL/min (A) (by C-G formula based on SCr of 4.87 mg/dL (H)).    Allergies  Allergen Reactions  . Iodine Swelling  . Penicillins Swelling    Has patient had a PCN reaction causing immediate rash, facial/tongue/throat swelling, SOB or lightheadedness with hypotension: Yes Has patient had a PCN reaction causing severe rash involving mucus membranes or skin necrosis: Yes Has patient had a PCN reaction that required hospitalization: No Has patient had a PCN reaction occurring within the last 10 years: No If all of the above answers are "NO", then may proceed with Cephalosporin use.     Antimicrobials this admission: 6/26 Aztreonam x 1 6/26 Levofloxacin x 1 6/27 Meropenem >>  Dose adjustments this admission: N/A  Microbiology results: 6/26 BCx: Ecoli 6/26 MRSA PCR: negative   Thank you for allowing pharmacy to be a part of this patient's care.  Orest Dygert L 11/12/2017 4:29 PM

## 2017-11-12 NOTE — Progress Notes (Addendum)
[place] at [place] NAME: Deanna Gay    MR#:  [id number]  DATE OF BIRTH:  Dec 11, 1955  SUBJECTIVE:  CHIEF COMPLAINT:   Chief Complaint  Patient presents with  . Hypotension   The patient feels better, has urine frequency. REVIEW OF SYSTEMS:  Review of Systems  Constitutional: Positive for malaise/fatigue. Negative for chills and fever.  HENT: Negative for sore throat.   Eyes: Negative for blurred vision and double vision.  Respiratory: Negative for cough, hemoptysis, shortness of breath, wheezing and stridor.   Cardiovascular: Negative for chest pain, palpitations, orthopnea and leg swelling.  Gastrointestinal: Negative for abdominal pain, blood in stool, diarrhea, melena, nausea and vomiting.  Genitourinary: Positive for frequency and urgency. Negative for dysuria, flank pain and hematuria.  Musculoskeletal: Negative for back pain and joint pain.  Skin: Negative for rash.  Neurological: Negative for dizziness, sensory change, focal weakness, seizures, loss of consciousness, weakness and headaches.  Endo/Heme/Allergies: Negative for polydipsia.  Psychiatric/Behavioral: Negative for depression. The patient is not nervous/anxious.     DRUG ALLERGIES:   Allergies  Allergen Reactions  . Iodine Swelling  . Penicillins Swelling    Has patient had a PCN reaction causing immediate rash, facial/tongue/throat swelling, SOB or lightheadedness with hypotension: Yes Has patient had a PCN reaction causing severe rash involving mucus membranes or skin necrosis: Yes Has patient had a PCN reaction that required hospitalization: No Has patient had a PCN reaction occurring within the last 10 years: No If all of the above answers are "NO", then may proceed with Cephalosporin use.    VITALS:  Blood pressure 108/72, pulse 91, temperature (!) 97 F (36.1 C), temperature source Axillary, resp. rate 20, height 5' 4"  (1.626 m), weight 202 lb 13.2  oz (92 kg), SpO2 96 %. PHYSICAL EXAMINATION:  Physical Exam  Constitutional: She is oriented to person, place, and time. She appears well-developed.  Obesity.  HENT:  Head: Normocephalic.  Mouth/Throat: Oropharynx is clear and moist.  Eyes: Pupils are equal, round, and reactive to light. Conjunctivae and EOM are normal. No scleral icterus.  Neck: Normal range of motion. Neck supple. No JVD present. No tracheal deviation present.  Cardiovascular: Normal rate, regular rhythm and normal heart sounds. Exam reveals no gallop.  No murmur heard. Pulmonary/Chest: Effort normal and breath sounds normal. No respiratory distress. She has no wheezes. She has no rales.  Abdominal: Soft. Bowel sounds are normal. She exhibits no distension. There is no tenderness. There is no rebound.  Musculoskeletal: Normal range of motion. She exhibits no edema or tenderness.  Neurological: She is alert and oriented to person, place, and time. No cranial nerve deficit.  Skin: No rash noted. No erythema.  Psychiatric: She has a normal mood and affect.   LABORATORY PANEL:  Female CBC Recent Labs  Lab 11/12/17 0355  WBC 15.9*  HGB 10.1*  HCT 31.6*  PLT 194   ------------------------------------------------------------------------------------------------------------------ Chemistries  Recent Labs  Lab 11/11/17 1226 11/12/17 0355  NA 129* 129*  K 4.5 5.0  CL 87* 94*  CO2 21* 18*  GLUCOSE 214* 236*  BUN 55* 70*  CREATININE 4.51* 4.87*  CALCIUM 7.2* 5.9*  MG  --  1.3*  AST 39  --   ALT 21  --   ALKPHOS 110  --   BILITOT 1.1  --    RADIOLOGY:  US Renal  Result Date: 11/11/2017 CLINICAL DATA:  Acute kidney injury. EXAM: RENAL / URINARY TRACT ULTRASOUND COMPLETE  COMPARISON:  CT abdomen pelvis-11/28/2016 FINDINGS: Right Kidney: Normal cortical thickness, echogenicity and size, measuring 13.8 cm in length. Note is made of a mild fetal lobulation of the right renal cortex. No focal renal lesions. No  echogenic renal stones. No urinary obstruction. Left Kidney: Evaluation of the left kidney is minimally degraded secondary to overlying bowel gas and poor sonographic window. Normal cortical thickness, echogenicity and size, measuring 12.6 cm in length. No focal renal lesions. No echogenic renal stones. No urinary obstruction. Bladder: Decompressed. IMPRESSION: No explanation for patient's acute renal insufficiency. Specifically, no evidence of urinary obstruction. Electronically Signed   By: Sandi Mariscal M.D.   On: 11/11/2017 17:47   ASSESSMENT AND PLAN:   *Severe sepsis with septic shock due to UTI and E. coli bacteremia.   The patient got fluid resuscitation as per sepsis protocol.  Hypotension improved. Started on IV aztreonam in the emergency room.   Change to meropenem.  Urine culture and blood culture show E. coli.  Follow-up sensitivity and CBC.  *Acute kidney injury.  Likely due to severe sepsis and hypotension.    Worsening renal function. Renal ultrasound did not show any obstruction.  Continue IV fluid support, follow-up BMP and nephrology consult.  Hold Vasotec and HCTZ.  Hyponatremia.  Continue normal saline IV and follow-up BMP. Hypomagnesemia.  Magnesium supplement.  Follow-up level.  *Diabetes mellitus.  Sliding scale insulin.  Hold metformin.  *Chronic respiratory failure with COPD exacerbation -IV steroids, Antibiotics - Scheduled Nebulizers.  Tobacco abuse.  Smoking cessation was counseled for 3 minutes.  All the records are reviewed and case discussed with Care Management/Social Worker. Management plans discussed with the patient, sisters and they are in agreement.  CODE STATUS: Full Code  TOTAL TIME TAKING CARE OF THIS PATIENT: 37 minutes.   More than 50% of the time was spent in counseling/coordination of care: YES  POSSIBLE D/C IN 3 DAYS, DEPENDING ON CLINICAL CONDITION.   Demetrios Loll M.D on 11/12/2017 at 3:32 PM  Between 7am to 6pm - Pager -  [phone]  After 6pm go to [email or web address] - [facility]

## 2017-11-12 NOTE — Progress Notes (Signed)
PHARMACY - PHYSICIAN COMMUNICATION CRITICAL VALUE ALERT - BLOOD CULTURE IDENTIFICATION (BCID)  Results for orders placed or performed during the hospital encounter of 11/11/17  Blood Culture ID Panel (Reflexed) (Collected: 11/11/2017 12:26 PM)  Result Value Ref Range   Enterococcus species NOT DETECTED NOT DETECTED   Listeria monocytogenes NOT DETECTED NOT DETECTED   Staphylococcus species NOT DETECTED NOT DETECTED   Staphylococcus aureus NOT DETECTED NOT DETECTED   Streptococcus species NOT DETECTED NOT DETECTED   Streptococcus agalactiae NOT DETECTED NOT DETECTED   Streptococcus pneumoniae NOT DETECTED NOT DETECTED   Streptococcus pyogenes NOT DETECTED NOT DETECTED   Acinetobacter baumannii NOT DETECTED NOT DETECTED   Enterobacteriaceae species DETECTED (A) NOT DETECTED   Enterobacter cloacae complex NOT DETECTED NOT DETECTED   Escherichia coli DETECTED (A) NOT DETECTED   Klebsiella oxytoca NOT DETECTED NOT DETECTED   Klebsiella pneumoniae NOT DETECTED NOT DETECTED   Proteus species NOT DETECTED NOT DETECTED   Serratia marcescens NOT DETECTED NOT DETECTED   Carbapenem resistance NOT DETECTED NOT DETECTED   Haemophilus influenzae NOT DETECTED NOT DETECTED   Neisseria meningitidis NOT DETECTED NOT DETECTED   Pseudomonas aeruginosa NOT DETECTED NOT DETECTED   Candida albicans NOT DETECTED NOT DETECTED   Candida glabrata NOT DETECTED NOT DETECTED   Candida krusei NOT DETECTED NOT DETECTED   Candida parapsilosis NOT DETECTED NOT DETECTED   Candida tropicalis NOT DETECTED NOT DETECTED    Name of physician (or Provider) Contacted: Marda Stalker   Changes to prescribed antibiotics required: Yes, will d/c levaquin and start Meropenem 500 mg IV Q12H.   Esvin Hnat D 11/12/2017  1:04 AM

## 2017-11-12 NOTE — Progress Notes (Signed)
Pharmacy Electrolyte Monitoring Consult:  Pharmacy consulted to assist in monitoring and replacing electrolytes in this 62 y.o. female admitted on 11/11/2017 with Bacteremia/UTI and acute renal failure.   Patient received calcium gluconate 1g IV x 1 this am.   Labs:  Sodium (mmol/L)  Date Value  11/12/2017 129 (L)  07/21/2013 138   Potassium (mmol/L)  Date Value  11/12/2017 5.0  07/21/2013 4.0   Magnesium (mg/dL)  Date Value  11/12/2017 1.3 (L)  10/22/2011 1.8   Phosphorus (mg/dL)  Date Value  11/12/2017 8.9 (H)   Calcium (mg/dL)  Date Value  11/12/2017 5.9 (LL)   Calcium, Total (mg/dL)  Date Value  07/21/2013 8.4 (L)   Albumin (g/dL)  Date Value  11/12/2017 2.1 (L)    Assessment/Plan: Electrolytes: magnesium 4g IV x 1. Will recheck electrolytes with am labs.   Glucose: will follow diabetes team recommendations of Lantus 10units Qhs and Novolog 3 units TID with meals.   Pharmacy will continue to monitor and adjust per consult.   Mick Tanguma L 11/12/2017 8:41 PM

## 2017-11-12 NOTE — Progress Notes (Signed)
Patient moved to room 207 by wheelchair. Bladder scan performed prior to moving patient and resulted 141 cc.  Family at bedside in new room and patient up to bedside commode.  Janett Billow, RN in room with patient.

## 2017-11-12 NOTE — Progress Notes (Signed)
Pharmacy Electrolyte Monitoring Consult:  Pharmacy consulted to assist in monitoring and replacing electrolytes in this 62 y.o. female admitted on 11/11/2017 with Bacteremia/UTI and acute renal failure.   Labs:  Sodium (mmol/L)  Date Value  11/12/2017 129 (L)  07/21/2013 138   Potassium (mmol/L)  Date Value  11/12/2017 5.0  07/21/2013 4.0   Magnesium (mg/dL)  Date Value  11/12/2017 1.3 (L)  10/22/2011 1.8   Phosphorus (mg/dL)  Date Value  11/12/2017 8.9 (H)   Calcium (mg/dL)  Date Value  11/12/2017 5.9 (LL)   Calcium, Total (mg/dL)  Date Value  07/21/2013 8.4 (L)   Albumin (g/dL)  Date Value  11/12/2017 2.1 (L)    Assessment/Plan: Electrolytes: magnesium 4g IV x 1. Will recheck electrolytes with am labs.   Glucose: will follow diabetes team recommendations of Lantus 10units Qhs and Novolog 3 units TID with meals.   Pharmacy will continue to monitor and adjust per consult.   Navreet Bolda L 11/12/2017 4:22 PM

## 2017-11-13 LAB — BASIC METABOLIC PANEL
Anion gap: 15 (ref 5–15)
BUN: 85 mg/dL — AB (ref 8–23)
CALCIUM: 6 mg/dL — AB (ref 8.9–10.3)
CO2: 17 mmol/L — AB (ref 22–32)
Chloride: 93 mmol/L — ABNORMAL LOW (ref 98–111)
Creatinine, Ser: 5.38 mg/dL — ABNORMAL HIGH (ref 0.44–1.00)
GFR calc Af Amer: 9 mL/min — ABNORMAL LOW (ref >60)
GFR, EST NON AFRICAN AMERICAN: 8 mL/min — AB (ref >60)
GLUCOSE: 181 mg/dL — AB (ref 70–99)
POTASSIUM: 5.1 mmol/L (ref 3.5–5.1)
Sodium: 125 mmol/L — ABNORMAL LOW (ref 135–145)

## 2017-11-13 LAB — CBC
HCT: 32.1 % — ABNORMAL LOW (ref 35.0–47.0)
Hemoglobin: 10.6 g/dL — ABNORMAL LOW (ref 12.0–16.0)
MCH: 27.5 pg (ref 26.0–34.0)
MCHC: 33.1 g/dL (ref 32.0–36.0)
MCV: 83.1 fL (ref 80.0–100.0)
PLATELETS: 182 10*3/uL (ref 150–440)
RBC: 3.87 MIL/uL (ref 3.80–5.20)
RDW: 15.9 % — AB (ref 11.5–14.5)
WBC: 15.1 10*3/uL — ABNORMAL HIGH (ref 3.6–11.0)

## 2017-11-13 LAB — PHOSPHORUS: Phosphorus: 8.3 mg/dL — ABNORMAL HIGH (ref 2.5–4.6)

## 2017-11-13 LAB — GLUCOSE, CAPILLARY
Glucose-Capillary: 187 mg/dL — ABNORMAL HIGH (ref 70–99)
Glucose-Capillary: 193 mg/dL — ABNORMAL HIGH (ref 70–99)
Glucose-Capillary: 223 mg/dL — ABNORMAL HIGH (ref 70–99)
Glucose-Capillary: 197 mg/dL — ABNORMAL HIGH (ref 70–99)

## 2017-11-13 LAB — MAGNESIUM: MAGNESIUM: 2.8 mg/dL — AB (ref 1.7–2.4)

## 2017-11-13 LAB — PROCALCITONIN: PROCALCITONIN: 94.75 ng/mL

## 2017-11-13 MED ORDER — PREDNISONE 20 MG PO TABS: 40.0000 mg | ORAL_TABLET | Freq: Every day | ORAL | Status: DC

## 2017-11-13 MED ORDER — DIPHENHYDRAMINE HCL 25 MG PO CAPS
25.0000 mg | ORAL_CAPSULE | Freq: Four times a day (QID) | ORAL | Status: DC | PRN
  Administered 2017-11-13 – 2017-11-17 (×4): 25 mg via ORAL
  Filled 2017-11-13 (×5): qty 1

## 2017-11-13 MED ORDER — SODIUM BICARBONATE 8.4 % IV SOLN
INTRAVENOUS | Status: DC
  Administered 2017-11-13 (×2): via INTRAVENOUS
  Filled 2017-11-13 (×5): qty 150

## 2017-11-13 NOTE — Progress Notes (Signed)
[facility] at [place] NAME: Deanna Gay    MR#:  [id number]  DATE OF BIRTH:  [birth year]  SUBJECTIVE:  CHIEF COMPLAINT:   Chief Complaint  Patient presents with  . Hypotension   The patient has body itching REVIEW OF SYSTEMS:  Review of Systems  Constitutional: Positive for malaise/fatigue. Negative for chills and fever.  HENT: Negative for sore throat.   Eyes: Negative for blurred vision and double vision.  Respiratory: Negative for cough, hemoptysis, shortness of breath, wheezing and stridor.   Cardiovascular: Negative for chest pain, palpitations, orthopnea and leg swelling.  Gastrointestinal: Negative for abdominal pain, blood in stool, diarrhea, melena, nausea and vomiting.  Genitourinary: Negative for dysuria, flank pain, frequency, hematuria and urgency.  Musculoskeletal: Negative for back pain and joint pain.  Skin: Positive for itching. Negative for rash.  Neurological: Negative for dizziness, sensory change, focal weakness, seizures, loss of consciousness, weakness and headaches.  Endo/Heme/Allergies: Negative for polydipsia.  Psychiatric/Behavioral: Negative for depression. The patient is not nervous/anxious.     DRUG ALLERGIES:   Allergies  Allergen Reactions  . Iodine Swelling  . Penicillins Swelling    Has patient had a PCN reaction causing immediate rash, facial/tongue/throat swelling, SOB or lightheadedness with hypotension: Yes Has patient had a PCN reaction causing severe rash involving mucus membranes or skin necrosis: Yes Has patient had a PCN reaction that required hospitalization: No Has patient had a PCN reaction occurring within the last 10 years: No If all of the above answers are "NO", then may proceed with Cephalosporin use.    VITALS:  Blood pressure (!) 120/53, pulse 79, temperature (P) 98.1 F (36.7 C), temperature source (P) Oral, resp. rate (!) 22, height 5' 4"  (1.626 m), weight 239 lb 1.6 oz  (108.5 kg), SpO2 90 %. PHYSICAL EXAMINATION:  Physical Exam  Constitutional: She is oriented to person, place, and time. She appears well-developed.  Obesity.  HENT:  Head: Normocephalic.  Mouth/Throat: Oropharynx is clear and moist.  Eyes: Pupils are equal, round, and reactive to light. Conjunctivae and EOM are normal. No scleral icterus.  Neck: Normal range of motion. Neck supple. No JVD present. No tracheal deviation present.  Cardiovascular: Normal rate, regular rhythm and normal heart sounds. Exam reveals no gallop.  No murmur heard. Pulmonary/Chest: Effort normal and breath sounds normal. No respiratory distress. She has no wheezes. She has no rales.  Abdominal: Soft. Bowel sounds are normal. She exhibits no distension. There is no tenderness. There is no rebound.  Musculoskeletal: Normal range of motion. She exhibits no edema or tenderness.  Neurological: She is alert and oriented to person, place, and time. No cranial nerve deficit.  Skin: No rash noted. No erythema.  Psychiatric: She has a normal mood and affect.   LABORATORY PANEL:  Female CBC Recent Labs  Lab 11/13/17 0703  WBC 15.1*  HGB 10.6*  HCT 32.1*  PLT 182   ------------------------------------------------------------------------------------------------------------------ Chemistries  Recent Labs  Lab 11/11/17 1226  11/13/17 0703  NA 129*   < > 125*  K 4.5   < > 5.1  CL 87*   < > 93*  CO2 21*   < > 17*  GLUCOSE 214*   < > 181*  BUN 55*   < > 85*  CREATININE 4.51*   < > 5.38*  CALCIUM 7.2*   < > 6.0*  MG  --    < > 2.8*  AST 39  --   --  ALT 21  --   --   ALKPHOS 110  --   --   BILITOT 1.1  --   --    < > = values in this interval not displayed.   RADIOLOGY:  No results found. ASSESSMENT AND PLAN:   *Severe sepsis with septic shock due to UTI and E. coli bacteremia.   The patient got fluid resuscitation as per sepsis protocol.  Hypotension improved. Started on IV aztreonam in the emergency  room.   Continue meropenem.  Urine culture and blood culture show E. coli.  Follow-up sensitivity and CBC.  *Acute kidney injury.  Likely due to severe sepsis and hypotension.    Worsening renal function. Renal ultrasound did not show any obstruction.  Continue IV fluid support, follow-up BMP and nephrology consult.  Hold Vasotec and HCTZ.  Hyponatremia.  Worsening, continue normal saline IV and follow-up BMP. Hypomagnesemia.  Improved with magnesium supplement.  *Diabetes mellitus.  Sliding scale insulin.  Hold metformin.  Lantus 10 units at bedtime with NovoLog 3 units 3 times daily.  *Chronic respiratory failure with COPD exacerbation.  Improving. Change to prednisone p.o. and continue nebulizers.  Tobacco abuse.  Smoking cessation was counseled for 3 minutes.  All the records are reviewed and case discussed with Care Management/Social Worker. Management plans discussed with the patient, sisters and they are in agreement.  CODE STATUS: Full Code  TOTAL TIME TAKING CARE OF THIS PATIENT: 37 minutes.   More than 50% of the time was spent in counseling/coordination of care: YES  POSSIBLE D/C IN 3 DAYS, DEPENDING ON CLINICAL CONDITION.   Demetrios Loll M.D on 11/13/2017 at 1:46 PM  Between 7am to 6pm - Pager - [phone]  After 6pm go to [email or web address] - [facility]

## 2017-11-13 NOTE — Progress Notes (Signed)
Pharmacy Electrolyte Monitoring Consult:  Pharmacy consulted to assist in monitoring and replacing electrolytes in this 62 y.o. female admitted on 11/11/2017 with Bacteremia/UTI and acute renal failure.   Labs:  Sodium (mmol/L)  Date Value  11/13/2017 125 (L)  07/21/2013 138   Potassium (mmol/L)  Date Value  11/13/2017 5.1  07/21/2013 4.0   Magnesium (mg/dL)  Date Value  11/13/2017 2.8 (H)  10/22/2011 1.8   Phosphorus (mg/dL)  Date Value  11/13/2017 8.3 (H)   Calcium (mg/dL)  Date Value  11/13/2017 6.0 (LL)   Calcium, Total (mg/dL)  Date Value  07/21/2013 8.4 (L)   Albumin (g/dL)  Date Value  11/12/2017 2.1 (L)    Assessment/Plan: Electrolytes WNL. Neprhology is following. Will sign off.   Pharmacy will continue to monitor and adjust per consult.   Deanna Gay 11/13/2017 8:58 AM

## 2017-11-13 NOTE — Progress Notes (Signed)
Pharmacy Antibiotic Note  Deanna Gay is a 62 y.o. female admitted on 11/11/2017 with Urosepsis/Bacteremia.  Pharmacy has been consulted for meropenem dosing.   Plan: Will continue meropenem 500 mg iv q 12 hours. In the setting of acute renal failure, could consider decreasing dose to q 24 hours. Will continue with q 12 h for now due to bacteremia.   Height: 5' 4"  (162.6 cm) Weight: 239 lb 1.6 oz (108.5 kg) IBW/kg (Calculated) : 54.7  Temp (24hrs), Avg:97.4 F (36.3 C), Min:97 F (36.1 C), Max:97.8 F (36.6 C)  Recent Labs  Lab 11/11/17 1226 11/11/17 1554 11/11/17 2048 11/12/17 0355 11/12/17 0629 11/13/17 0703  WBC 17.1*  --   --  15.9*  --  15.1*  CREATININE 4.51*  --   --  4.87*  --  5.38*  LATICACIDVEN 4.8* 4.9* 3.3*  --  1.4  --     Estimated Creatinine Clearance: 13 mL/min (A) (by C-G formula based on SCr of 5.38 mg/dL (H)).    Allergies  Allergen Reactions  . Iodine Swelling  . Penicillins Swelling    Has patient had a PCN reaction causing immediate rash, facial/tongue/throat swelling, SOB or lightheadedness with hypotension: Yes Has patient had a PCN reaction causing severe rash involving mucus membranes or skin necrosis: Yes Has patient had a PCN reaction that required hospitalization: No Has patient had a PCN reaction occurring within the last 10 years: No If all of the above answers are "NO", then may proceed with Cephalosporin use.     Antimicrobials this admission: 6/26 Aztreonam x 1 6/26 Levofloxacin x 1 6/27 Meropenem >>  Dose adjustments this admission: N/A  Microbiology results: UCx: sent 6/26 BCx: Ecoli 6/26 MRSA PCR: negative   Thank you for allowing pharmacy to be a part of this patient's care.  Ulice Dash D 11/13/2017 9:00 AM

## 2017-11-13 NOTE — Progress Notes (Signed)
[facility]  ROUNDING NOTE   Subjective:   Patient feeling better.   Creatinine 5.38 (4.87)  Na 125 CO2 17  Objective:  Vital signs in last 24 hours:  Temp:  [97.8 F (36.6 C)-98.1 F (36.7 C)] 98.1 F (36.7 C) (06/28 1142) Pulse Rate:  [79-91] 79 (06/28 1142) Resp:  [20-24] 22 (06/28 0457) BP: (114-122)/(53-78) 120/53 (06/28 1142) SpO2:  [90 %-99 %] 94 % (06/28 1423) Weight:  [108.5 kg (239 lb 1.6 oz)] 108.5 kg (239 lb 1.6 oz) (06/28 0500)  Weight change: 15.9 kg (35 lb 1.6 oz) Filed Weights   11/11/17 1204 11/11/17 2014 11/13/17 0500  Weight: 92.5 kg (204 lb) 92 kg (202 lb 13.2 oz) 108.5 kg (239 lb 1.6 oz)    Intake/Output: I/O last 3 completed shifts: In: 2465.3 [P.O.:120; I.V.:1735.3; IV Piggyback:610] Out: 650 [Urine:650]   Intake/Output this shift:  Total I/O In: 870 [P.O.:240; I.V.:530; IV Piggyback:100] Out: 800 [Urine:800]  Physical Exam: General: NAD,   Head: Normocephalic, atraumatic. Moist oral mucosal membranes  Eyes: Anicteric, PERRL  Neck: Supple, trachea midline  Lungs:  Clear to auscultation  Heart: Regular rate and rhythm  Abdomen:  Soft, nontender,   Extremities:  no peripheral edema.  Neurologic: Nonfocal, moving all four extremities  Skin: No lesions        Basic Metabolic Panel: Recent Labs  Lab 11/11/17 1226 11/12/17 0355 11/13/17 0703  NA 129* 129* 125*  K 4.5 5.0 5.1  CL 87* 94* 93*  CO2 21* 18* 17*  GLUCOSE 214* 236* 181*  BUN 55* 70* 85*  CREATININE 4.51* 4.87* 5.38*  CALCIUM 7.2* 5.9* 6.0*  MG  --  1.3* 2.8*  PHOS  --  8.9* 8.3*    Liver Function Tests: Recent Labs  Lab 11/11/17 1226 11/12/17 0355  AST 39  --   ALT 21  --   ALKPHOS 110  --   BILITOT 1.1  --   PROT 7.5  --   ALBUMIN 2.9* 2.1*   Recent Labs  Lab 11/11/17 1226  LIPASE 16   No results for input(s): AMMONIA in the last 168 hours.  CBC: Recent Labs  Lab 11/11/17 1226 11/12/17 0355 11/13/17 0703  WBC 17.1* 15.9* 15.1*   NEUTROABS 16.4*  --   --   HGB 11.9* 10.1* 10.6*  HCT 37.3 31.6* 32.1*  MCV 83.8 84.0 83.1  PLT 242 194 182    Cardiac Enzymes: Recent Labs  Lab 11/11/17 1226 11/11/17 1402 11/11/17 2048 11/12/17 0355 11/12/17 1044  CKTOTAL  --  36*  --   --   --   TROPONINI 0.03*  --  <0.03 <0.03 0.03*    BNP: Invalid input(s): POCBNP  CBG: Recent Labs  Lab 11/12/17 1654 11/12/17 2123 11/13/17 0725 11/13/17 1142 11/13/17 1622  GLUCAP 260* 237* 187* 193* 223*    Microbiology: Results for orders placed or performed during the hospital encounter of 11/11/17  Blood Culture (routine x 2)     Status: Abnormal (Preliminary result)   Collection Time: 11/11/17 12:26 PM  Result Value Ref Range Status   Specimen Description   Final    BLOOD LEFT ANTECUBITAL Performed at [facility], [address], [place], [place] [address]    Special Requests   Final    BOTTLES DRAWN AEROBIC AND ANAEROBIC Blood Culture adequate volume Performed at [facility], [address] [place], [place] [address]    Culture  Setup Time   Final  IN BOTH AEROBIC AND ANAEROBIC BOTTLES GRAM NEGATIVE RODS CRITICAL RESULT CALLED TO, READ BACK BY AND VERIFIED WITH: C/ JASON ROBINS @0015  11/12/17 [facility]    Culture (A)  Final    ESCHERICHIA COLI SUSCEPTIBILITIES TO FOLLOW Performed at [facility], [address], [place], [place] [address]    Report Status PENDING  Incomplete  Blood Culture ID Panel (Reflexed)     Status: Abnormal   Collection Time: 11/11/17 12:26 PM  Result Value Ref Range Status   Enterococcus species NOT DETECTED NOT DETECTED Final   Listeria monocytogenes NOT DETECTED NOT DETECTED Final   Staphylococcus species NOT DETECTED NOT DETECTED Final   Staphylococcus aureus NOT DETECTED NOT DETECTED Final   Streptococcus species NOT DETECTED NOT DETECTED Final   Streptococcus agalactiae NOT DETECTED NOT DETECTED Final   Streptococcus pneumoniae NOT DETECTED NOT DETECTED  Final   Streptococcus pyogenes NOT DETECTED NOT DETECTED Final   Acinetobacter baumannii NOT DETECTED NOT DETECTED Final   Enterobacteriaceae species DETECTED (A) NOT DETECTED Final    Comment: Enterobacteriaceae represent a large family of gram-negative bacteria, not a single organism. C/ JASON ROBINS @0015  11/12/17 [facility]    Enterobacter cloacae complex NOT DETECTED NOT DETECTED Final   Escherichia coli DETECTED (A) NOT DETECTED Final    Comment: C/ JASON ROBINS @0015  11/12/17 [facility]   Klebsiella oxytoca NOT DETECTED NOT DETECTED Final   Klebsiella pneumoniae NOT DETECTED NOT DETECTED Final   Proteus species NOT DETECTED NOT DETECTED Final   Serratia marcescens NOT DETECTED NOT DETECTED Final   Carbapenem resistance NOT DETECTED NOT DETECTED Final   Haemophilus influenzae NOT DETECTED NOT DETECTED Final   Neisseria meningitidis NOT DETECTED NOT DETECTED Final   Pseudomonas aeruginosa NOT DETECTED NOT DETECTED Final   Candida albicans NOT DETECTED NOT DETECTED Final   Candida glabrata NOT DETECTED NOT DETECTED Final   Candida krusei NOT DETECTED NOT DETECTED Final   Candida parapsilosis NOT DETECTED NOT DETECTED Final   Candida tropicalis NOT DETECTED NOT DETECTED Final    Comment: Performed at [facility], [address] [place], [place] [address]  Blood Culture (routine x 2)     Status: None (Preliminary result)   Collection Time: 11/11/17 12:31 PM  Result Value Ref Range Status   Specimen Description BLOOD RIGHT ANTECUBITAL  Final   Special Requests   Final    BOTTLES DRAWN AEROBIC AND ANAEROBIC Blood Culture results may not be optimal due to an excessive volume of blood received in culture bottles   Culture  Setup Time   Final    IN BOTH AEROBIC AND ANAEROBIC BOTTLES GRAM NEGATIVE RODS CRITICAL VALUE NOTED.  VALUE IS CONSISTENT WITH PREVIOUSLY REPORTED AND CALLED VALUE. Performed at [facility], [address] [place], [place] [address]    Culture GRAM  NEGATIVE RODS  Final   Report Status PENDING  Incomplete  MRSA PCR Screening     Status: None   Collection Time: 11/11/17  8:28 PM  Result Value Ref Range Status   MRSA by PCR NEGATIVE NEGATIVE Final    Comment:        The GeneXpert MRSA Assay (FDA approved for NASAL specimens only), is one component of a comprehensive MRSA colonization surveillance program. It is not intended to diagnose MRSA infection nor to guide or monitor treatment for MRSA infections. Performed at [facility], [address] [place], [place] [address]     Coagulation Studies: No results for input(s): LABPROT, INR in the  last 72 hours.  Urinalysis: Recent Labs    11/11/17 1226  COLORURINE AMBER*  LABSPEC 1.017  PHURINE 5.0  GLUCOSEU NEGATIVE  HGBUR MODERATE*  BILIRUBINUR NEGATIVE  KETONESUR NEGATIVE  PROTEINUR 100*  NITRITE NEGATIVE  LEUKOCYTESUR MODERATE*      Imaging: US Renal  Result Date: 11/11/2017 CLINICAL DATA:  Acute kidney injury. EXAM: RENAL / URINARY TRACT ULTRASOUND COMPLETE COMPARISON:  CT abdomen pelvis-11/28/2016 FINDINGS: Right Kidney: Normal cortical thickness, echogenicity and size, measuring 13.8 cm in length. Note is made of a mild fetal lobulation of the right renal cortex. No focal renal lesions. No echogenic renal stones. No urinary obstruction. Left Kidney: Evaluation of the left kidney is minimally degraded secondary to overlying bowel gas and poor sonographic window. Normal cortical thickness, echogenicity and size, measuring 12.6 cm in length. No focal renal lesions. No echogenic renal stones. No urinary obstruction. Bladder: Decompressed. IMPRESSION: No explanation for patient's acute renal insufficiency. Specifically, no evidence of urinary obstruction. Electronically Signed   By: Sandi Mariscal M.D.   On: 11/11/2017 17:47     Medications:   . meropenem (MERREM) IV Stopped (11/13/17 1608)  .  sodium bicarbonate  infusion 1000 mL 100 mL/hr at 11/13/17 1030    . budesonide (PULMICORT) nebulizer solution  0.25 mg Nebulization BID  . gabapentin  100 mg Oral TID  . heparin injection (subcutaneous)  5,000 Units Subcutaneous Q8H  . insulin aspart  0-15 Units Subcutaneous TID WC  . insulin aspart  0-5 Units Subcutaneous QHS  . insulin aspart  3 Units Subcutaneous TID WC  . insulin glargine  10 Units Subcutaneous QHS  . ipratropium-albuterol  3 mL Nebulization Q6H  . nicotine  14 mg Transdermal Daily  . [START ON 11/14/2017] predniSONE  40 mg Oral Q breakfast  . risperiDONE  2 mg Oral Daily   acetaminophen **OR** acetaminophen, albuterol, diphenhydrAMINE, ondansetron **OR** ondansetron (ZOFRAN) IV, oxyCODONE-acetaminophen, polyethylene glycol, traMADol  Assessment/ Plan:    Ms. Deanna Gay is a 62 y.o. black female with COPD, tobacco abuse, depression, GERD, diabetes mellitus type II, schizophrenia, hypertension, obstructive sleep apnea, anemia who was admitted to [facility] on 11/11/2017 for acute renal failure  1. Acute renal failure with metabolic acidosis, hyponatremia, hypocalcemia: baseline creatinine of 0.8 on 10/08/17.  Ultrasound negative.  Urinalysis with hematuria and proteinuria.  - Continue IV fluids - if sodium is worse, will consider hypertonic saline.   2. Urinary tract infection with sepsis and bacteremia: E. Coli on blood and urine cultures 6/26.  - empiric meropenem.   3. 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 Not well controlled.  hemoglobin A1c of 8.2% on 10/08/17.   4. Anemia with renal failure: hemoglobin 10.6   LOS: 2 Deanna Gay 6/28/20195:10 PM

## 2017-11-14 LAB — RENAL FUNCTION PANEL
ALBUMIN: 2 g/dL — AB (ref 3.5–5.0)
Anion gap: 15 (ref 5–15)
BUN: 93 mg/dL — ABNORMAL HIGH (ref 8–23)
CALCIUM: 5.8 mg/dL — AB (ref 8.9–10.3)
CO2: 19 mmol/L — ABNORMAL LOW (ref 22–32)
CREATININE: 5.65 mg/dL — AB (ref 0.44–1.00)
Chloride: 88 mmol/L — ABNORMAL LOW (ref 98–111)
GFR, EST AFRICAN AMERICAN: 8 mL/min — AB (ref >60)
GFR, EST NON AFRICAN AMERICAN: 7 mL/min — AB (ref >60)
Glucose, Bld: 296 mg/dL — ABNORMAL HIGH (ref 70–99)
PHOSPHORUS: 7.7 mg/dL — AB (ref 2.5–4.6)
Potassium: 4.7 mmol/L (ref 3.5–5.1)
Sodium: 122 mmol/L — ABNORMAL LOW (ref 135–145)

## 2017-11-14 LAB — GLUCOSE, CAPILLARY
GLUCOSE-CAPILLARY: 289 mg/dL — AB (ref 70–99)
Glucose-Capillary: 291 mg/dL — ABNORMAL HIGH (ref 70–99)
Glucose-Capillary: 165 mg/dL — ABNORMAL HIGH (ref 70–99)
Glucose-Capillary: 135 mg/dL — ABNORMAL HIGH (ref 70–99)

## 2017-11-14 LAB — CULTURE, BLOOD (ROUTINE X 2): Special Requests: ADEQUATE

## 2017-11-14 LAB — PHOSPHORUS: Phosphorus: 7.5 mg/dL — ABNORMAL HIGH (ref 2.5–4.6)

## 2017-11-14 LAB — SODIUM
Sodium: 126 mmol/L — ABNORMAL LOW (ref 135–145)
Sodium: 128 mmol/L — ABNORMAL LOW (ref 135–145)
Sodium: 129 mmol/L — ABNORMAL LOW (ref 135–145)

## 2017-11-14 MED ORDER — SODIUM CHLORIDE 3 % IV SOLN
INTRAVENOUS | Status: DC
  Administered 2017-11-14 – 2017-11-15 (×2): 25 mL/h via INTRAVENOUS
  Filled 2017-11-14 (×3): qty 500

## 2017-11-14 MED ORDER — INSULIN ASPART 100 UNIT/ML ~~LOC~~ SOLN
5.0000 [IU] | Freq: Three times a day (TID) | SUBCUTANEOUS | Status: DC
  Administered 2017-11-14 (×2): 5 [IU] via SUBCUTANEOUS
  Filled 2017-11-14 (×2): qty 1

## 2017-11-14 MED ORDER — PREDNISONE 20 MG PO TABS
30.0000 mg | ORAL_TABLET | Freq: Every day | ORAL | Status: DC
  Administered 2017-11-16 – 2017-11-18 (×2): 30 mg via ORAL
  Filled 2017-11-14 (×3): qty 2

## 2017-11-14 MED ORDER — CALCIUM CARBONATE ANTACID 500 MG PO CHEW
800.0000 mg | CHEWABLE_TABLET | Freq: Two times a day (BID) | ORAL | Status: DC
  Administered 2017-11-14 – 2017-11-19 (×6): 800 mg via ORAL
  Filled 2017-11-14 (×9): qty 4

## 2017-11-14 MED ORDER — CALCIUM ACETATE (PHOS BINDER) 667 MG PO CAPS
667.0000 mg | ORAL_CAPSULE | Freq: Three times a day (TID) | ORAL | Status: DC
Start: 2017-11-14 — End: 2017-11-14

## 2017-11-14 NOTE — Progress Notes (Addendum)
MEDICATION RELATED CONSULT NOTE - INITIAL   Pharmacy Consult for hypertonic saline monitoring Indication: hypertonic saline order  Patient Measurements: Height: 5' 4"  (162.6 cm) Weight: 239 lb 1.6 oz (108.5 kg) IBW/kg (Calculated) : 54.7 Adjusted Body Weight:   Vital Signs: Temp: 98.1 F (36.7 C) (06/29 2030) Temp Source: Oral (06/29 2030) BP: 104/86 (06/29 2030) Pulse Rate: 96 (06/29 2300) Intake/Output from previous day: 06/28 0701 - 06/29 0700 In: 1288 [P.O.:360; I.V.:828; IV Piggyback:100] Out: 1500 [Urine:1500] Intake/Output from this shift: Total I/O In: 0  Out: 400 [Urine:400]  Labs: Recent Labs    11/12/17 0355 11/13/17 0703 11/14/17 0646  WBC 15.9* 15.1*  --   HGB 10.1* 10.6*  --   HCT 31.6* 32.1*  --   PLT 194 182  --   CREATININE 4.87* 5.38* 5.65*  MG 1.3* 2.8*  --   PHOS 8.9* 8.3* 7.5*  7.7*  ALBUMIN 2.1*  --  2.0*   Estimated Creatinine Clearance: 12.4 mL/min (A) (by C-G formula based on SCr of 5.65 mg/dL (H)).   Medical History: Past Medical History:  Diagnosis Date  . Arthritis   . Cancer ([place])    cervical  . COPD (chronic obstructive pulmonary disease) ([place])   . Depression   . Diabetes mellitus without complication ([place])   . GERD (gastroesophageal reflux disease)   . Hypertension   . Schizophrenia ([place])   . Sleep apnea     Medications:  Infusions:  . meropenem (MERREM) IV Stopped (11/14/17 1320)  . sodium chloride (hypertonic) 25 mL/hr (11/14/17 1103)    Assessment: 73 yof admitted for acute renal failure with PMH COPD, tobacco abuse, depression, GERD, T2DM, schizophrenia, HTN, OSA, anemia. Low sodium noted. Nephrology consulted and starts hypertonic saline for hyponatremia. Renal U/S negative. UA with proteinuria, many bacteria, leukocyte esterase moderate, hematuria. Notably PCT >150 on admission. Patient is on empiric antibiotics for urosepsis. Pharmacy consulted to monitor sodium levels while on hypertonic saline.  Goal of  Therapy:  Na 135 to 145 Increase of no more than 12 mEq/L per 24 hours to prevent demyelination  Plan:  Sodium chloride 3% to start at 25 mL/hr. This rate is appropriate for a peripheral line. Sodium levels ordered Q6H so pharmacy will follow levels as ordered at this time.   6/29 1700 Na: 128. Continue infusion at 47m/min. Next sodium level @ 2300.   6/29 :  Na level @ 22:26 = 129.  Will continue infusion at current rate and recheck Na on 6/30 @ 0500.   Nussen Pullin D, Pharm.D., BCPS Clinical Pharmacist 11/14/2017,11:16 PM

## 2017-11-14 NOTE — Progress Notes (Signed)
[facility]  ROUNDING NOTE   Subjective:   Family at bedside.   UOP 1500  Creatinine 5.65 (5.38)  Ca 5.8 Na 122 (125)  Meropenem  Sodium bicarbonate gtt  Objective:  Vital signs in last 24 hours:  Temp:  [97.6 F (36.4 C)-98.1 F (36.7 C)] 97.6 F (36.4 C) (06/29 0522) Pulse Rate:  [79-84] 81 (06/29 0522) Resp:  [18-19] 19 (06/29 0522) BP: (105-127)/(53-72) 105/66 (06/29 0522) SpO2:  [90 %-97 %] 91 % (06/29 0522)  Weight change:  Filed Weights   11/11/17 1204 11/11/17 2014 11/13/17 0500  Weight: 92.5 kg (204 lb) 92 kg (202 lb 13.2 oz) 108.5 kg (239 lb 1.6 oz)    Intake/Output: I/O last 3 completed shifts: In: 2620 [P.O.:480; I.V.:1840; IV Piggyback:300] Out: 1950 [Urine:1950]   Intake/Output this shift:  No intake/output data recorded.  Physical Exam: General: NAD,   Head: Normocephalic, atraumatic. Moist oral mucosal membranes, Hard of hearing  Eyes: Anicteric, PERRL  Neck: Supple, trachea midline  Lungs:  Clear to auscultation  Heart: Regular rate and rhythm  Abdomen:  Soft, nontender, obese  Extremities: no peripheral edema.  Neurologic: Nonfocal, moving all four extremities  Skin: No lesions        Basic Metabolic Panel: Recent Labs  Lab 11/11/17 1226 11/12/17 0355 11/13/17 0703 11/14/17 0646  NA 129* 129* 125* 122*  K 4.5 5.0 5.1 4.7  CL 87* 94* 93* 88*  CO2 21* 18* 17* 19*  GLUCOSE 214* 236* 181* 296*  BUN 55* 70* 85* 93*  CREATININE 4.51* 4.87* 5.38* 5.65*  CALCIUM 7.2* 5.9* 6.0* 5.8*  MG  --  1.3* 2.8*  --   PHOS  --  8.9* 8.3* 7.5*  7.7*    Liver Function Tests: Recent Labs  Lab 11/11/17 1226 11/12/17 0355 11/14/17 0646  AST 39  --   --   ALT 21  --   --   ALKPHOS 110  --   --   BILITOT 1.1  --   --   PROT 7.5  --   --   ALBUMIN 2.9* 2.1* 2.0*   Recent Labs  Lab 11/11/17 1226  LIPASE 16   No results for input(s): AMMONIA in the last 168 hours.  CBC: Recent Labs  Lab 11/11/17 1226 11/12/17 0355  11/13/17 0703  WBC 17.1* 15.9* 15.1*  NEUTROABS 16.4*  --   --   HGB 11.9* 10.1* 10.6*  HCT 37.3 31.6* 32.1*  MCV 83.8 84.0 83.1  PLT 242 194 182    Cardiac Enzymes: Recent Labs  Lab 11/11/17 1226 11/11/17 1402 11/11/17 2048 11/12/17 0355 11/12/17 1044  CKTOTAL  --  36*  --   --   --   TROPONINI 0.03*  --  <0.03 <0.03 0.03*    BNP: Invalid input(s): POCBNP  CBG: Recent Labs  Lab 11/13/17 0725 11/13/17 1142 11/13/17 1622 11/13/17 2200 11/14/17 0747  GLUCAP 187* 193* 223* 197* 44*    Microbiology: Results for orders placed or performed during the hospital encounter of 11/11/17  Blood Culture (routine x 2)     Status: Abnormal   Collection Time: 11/11/17 12:26 PM  Result Value Ref Range Status   Specimen Description   Final    BLOOD LEFT ANTECUBITAL Performed at [facility], [address] [place], [place] [address]    Special Requests   Final    BOTTLES DRAWN AEROBIC AND ANAEROBIC Blood Culture adequate volume Performed at [facility], [address],  [place], [place] [address]    Culture  Setup Time   Final    IN BOTH AEROBIC AND ANAEROBIC BOTTLES GRAM NEGATIVE RODS CRITICAL RESULT CALLED TO, READ BACK BY AND VERIFIED WITH: C/ JASON ROBINS @0015  11/12/17 FLC Performed at [facility], [place] [address] [place], [place] [address]    Culture ESCHERICHIA COLI (A)  Final   Report Status 11/14/2017 FINAL  Final   Organism ID, Bacteria ESCHERICHIA COLI  Final      Susceptibility   Escherichia coli - MIC*    AMPICILLIN >=32 RESISTANT Resistant     CEFAZOLIN <=4 SENSITIVE Sensitive     CEFEPIME <=1 SENSITIVE Sensitive     CEFTAZIDIME <=1 SENSITIVE Sensitive     CEFTRIAXONE <=1 SENSITIVE Sensitive     CIPROFLOXACIN 1 SENSITIVE Sensitive     GENTAMICIN <=1 SENSITIVE Sensitive     IMIPENEM <=0.25 SENSITIVE Sensitive     TRIMETH/SULFA <=20 SENSITIVE Sensitive     AMPICILLIN/SULBACTAM >=32 RESISTANT Resistant     PIP/TAZO <=4  SENSITIVE Sensitive     Extended ESBL NEGATIVE Sensitive     * ESCHERICHIA COLI  Blood Culture ID Panel (Reflexed)     Status: Abnormal   Collection Time: 11/11/17 12:26 PM  Result Value Ref Range Status   Enterococcus species NOT DETECTED NOT DETECTED Final   Listeria monocytogenes NOT DETECTED NOT DETECTED Final   Staphylococcus species NOT DETECTED NOT DETECTED Final   Staphylococcus aureus NOT DETECTED NOT DETECTED Final   Streptococcus species NOT DETECTED NOT DETECTED Final   Streptococcus agalactiae NOT DETECTED NOT DETECTED Final   Streptococcus pneumoniae NOT DETECTED NOT DETECTED Final   Streptococcus pyogenes NOT DETECTED NOT DETECTED Final   Acinetobacter baumannii NOT DETECTED NOT DETECTED Final   Enterobacteriaceae species DETECTED (A) NOT DETECTED Final    Comment: Enterobacteriaceae represent a large family of gram-negative bacteria, not a single organism. C/ JASON ROBINS @0015  11/12/17 FLC    Enterobacter cloacae complex NOT DETECTED NOT DETECTED Final   Escherichia coli DETECTED (A) NOT DETECTED Final    Comment: C/ JASON ROBINS @0015  11/12/17 FLC   Klebsiella oxytoca NOT DETECTED NOT DETECTED Final   Klebsiella pneumoniae NOT DETECTED NOT DETECTED Final   Proteus species NOT DETECTED NOT DETECTED Final   Serratia marcescens NOT DETECTED NOT DETECTED Final   Carbapenem resistance NOT DETECTED NOT DETECTED Final   Haemophilus influenzae NOT DETECTED NOT DETECTED Final   Neisseria meningitidis NOT DETECTED NOT DETECTED Final   Pseudomonas aeruginosa NOT DETECTED NOT DETECTED Final   Candida albicans NOT DETECTED NOT DETECTED Final   Candida glabrata NOT DETECTED NOT DETECTED Final   Candida krusei NOT DETECTED NOT DETECTED Final   Candida parapsilosis NOT DETECTED NOT DETECTED Final   Candida tropicalis NOT DETECTED NOT DETECTED Final    Comment: Performed at [facility], [address], [place], [place] [address]  Blood Culture (routine x 2)     Status:  Abnormal   Collection Time: 11/11/17 12:31 PM  Result Value Ref Range Status   Specimen Description   Final    BLOOD RIGHT ANTECUBITAL Performed at [facility], [address] [place], [place] [address]    Special Requests   Final    BOTTLES DRAWN AEROBIC AND ANAEROBIC Blood Culture results may not be optimal due to an excessive volume of blood received in culture bottles Performed at [facility], [address], [place], [place] [address]    Culture  Setup Time  Final    IN BOTH AEROBIC AND ANAEROBIC BOTTLES GRAM NEGATIVE RODS CRITICAL VALUE NOTED.  VALUE IS CONSISTENT WITH PREVIOUSLY REPORTED AND CALLED VALUE. Performed at [facility], [address] [place], [place] [address]    Culture (A)  Final    ESCHERICHIA COLI SUSCEPTIBILITIES PERFORMED ON PREVIOUS CULTURE WITHIN THE LAST 5 DAYS. Performed at [facility], [place] [address] [place], [place] [address]    Report Status 11/14/2017 FINAL  Final  MRSA PCR Screening     Status: None   Collection Time: 11/11/17  8:28 PM  Result Value Ref Range Status   MRSA by PCR NEGATIVE NEGATIVE Final    Comment:        The GeneXpert MRSA Assay (FDA approved for NASAL specimens only), is one component of a comprehensive MRSA colonization surveillance program. It is not intended to diagnose MRSA infection nor to guide or monitor treatment for MRSA infections. Performed at [facility], [address] [place], [place] [address]     Coagulation Studies: No results for input(s): LABPROT, INR in the last 72 hours.  Urinalysis: Recent Labs    11/11/17 1226  COLORURINE AMBER*  LABSPEC 1.017  PHURINE 5.0  GLUCOSEU NEGATIVE  HGBUR MODERATE*  BILIRUBINUR NEGATIVE  KETONESUR NEGATIVE  PROTEINUR 100*  NITRITE NEGATIVE  LEUKOCYTESUR MODERATE*      Imaging: No results found.   Medications:   . meropenem (MERREM) IV Stopped (11/14/17 0232)  . sodium chloride (hypertonic) 25  mL/hr (11/14/17 1103)   . budesonide (PULMICORT) nebulizer solution  0.25 mg Nebulization BID  . calcium carbonate  800 mg of elemental calcium Oral BID  . gabapentin  100 mg Oral TID  . heparin injection (subcutaneous)  5,000 Units Subcutaneous Q8H  . insulin aspart  0-15 Units Subcutaneous TID WC  . insulin aspart  0-5 Units Subcutaneous QHS  . insulin aspart  5 Units Subcutaneous TID WC  . insulin glargine  10 Units Subcutaneous QHS  . ipratropium-albuterol  3 mL Nebulization Q6H  . nicotine  14 mg Transdermal Daily  . [START ON 11/15/2017] predniSONE  30 mg Oral Q breakfast  . risperiDONE  2 mg Oral Daily   acetaminophen **OR** acetaminophen, albuterol, diphenhydrAMINE, ondansetron **OR** ondansetron (ZOFRAN) IV, oxyCODONE-acetaminophen, polyethylene glycol, traMADol  Assessment/ Plan:  Ms. Deanna Gay is a 62 y.o. black female with COPD, tobacco abuse, depression, GERD, diabetes mellitus type II, schizophrenia, hypertension, obstructive sleep apnea, anemia who was admitted to [facility] on 11/11/2017 for acute renal failure  1. Acute renal failure with metabolic acidosis, hyponatremia, hypocalcemia: baseline creatinine of 0.8 on 10/08/17.  Ultrasound negative.  Urinalysis with hematuria and proteinuria.  - Change IV fluids to hypertonic saline (3% saline) 69m/hr. Serial sodium checks.   2. Urinary tract infection with sepsis and bacteremia: E. Coli on blood and urine cultures 6/26.  - empiric meropenem.   3. 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 Not well controlled.  hemoglobin A1c of 8.2% on 10/08/17.   4. Anemia with renal failure: hemoglobin 10.6   LOS: 3 Deanna Gay 6/29/201911:09 AM

## 2017-11-14 NOTE — Progress Notes (Signed)
[facility] [place] NAME: Deanna Gay    MR#:  [id number]  DATE OF BIRTH:  Apr 03, 1956  SUBJECTIVE:  CHIEF COMPLAINT:   Chief Complaint  Patient presents with  . Hypotension   The patient has no complaints.  Urine output 300 ml morning. REVIEW OF SYSTEMS:  Review of Systems  Constitutional: Positive for malaise/fatigue. Negative for chills and fever.  HENT: Negative for sore throat.   Eyes: Negative for blurred vision and double vision.  Respiratory: Negative for cough, hemoptysis, shortness of breath, wheezing and stridor.   Cardiovascular: Negative for chest pain, palpitations, orthopnea and leg swelling.  Gastrointestinal: Negative for abdominal pain, blood in stool, diarrhea, melena, nausea and vomiting.  Genitourinary: Negative for dysuria, flank pain, frequency, hematuria and urgency.  Musculoskeletal: Negative for back pain and joint pain.  Skin: Negative for itching and rash.  Neurological: Negative for dizziness, sensory change, focal weakness, seizures, loss of consciousness, weakness and headaches.  Endo/Heme/Allergies: Negative for polydipsia.  Psychiatric/Behavioral: Negative for depression. The patient is not nervous/anxious.     DRUG ALLERGIES:   Allergies  Allergen Reactions  . Iodine Swelling  . Penicillins Swelling    Has patient had a PCN reaction causing immediate rash, facial/tongue/throat swelling, SOB or lightheadedness with hypotension: Yes Has patient had a PCN reaction causing severe rash involving mucus membranes or skin necrosis: Yes Has patient had a PCN reaction that required hospitalization: No Has patient had a PCN reaction occurring within the last 10 years: No If all of the above answers are "NO", then may proceed with Cephalosporin use.    VITALS:  Blood pressure 118/66, pulse 82, temperature (!) 97.5 F (36.4 C), temperature source Oral, resp. rate 18, height 5' 4"  (1.626 m), weight 239  lb 1.6 oz (108.5 kg), SpO2 90 %. PHYSICAL EXAMINATION:  Physical Exam  Constitutional: She is oriented to person, place, and time. She appears well-developed.  Morbid Obesity.  HENT:  Head: Normocephalic.  Mouth/Throat: Oropharynx is clear and moist.  Eyes: Pupils are equal, round, and reactive to light. Conjunctivae and EOM are normal. No scleral icterus.  Neck: Normal range of motion. Neck supple. No JVD present. No tracheal deviation present.  Cardiovascular: Normal rate, regular rhythm and normal heart sounds. Exam reveals no gallop.  No murmur heard. Pulmonary/Chest: Effort normal and breath sounds normal. No respiratory distress. She has no wheezes. She has no rales.  Abdominal: Soft. Bowel sounds are normal. She exhibits no distension. There is no tenderness. There is no rebound.  Musculoskeletal: Normal range of motion. She exhibits no edema or tenderness.  Neurological: She is alert and oriented to person, place, and time. No cranial nerve deficit.  Skin: No rash noted. No erythema.  Psychiatric: She has a normal mood and affect.   LABORATORY PANEL:  Female CBC Recent Labs  Lab 11/13/17 0703  WBC 15.1*  HGB 10.6*  HCT 32.1*  PLT 182   ------------------------------------------------------------------------------------------------------------------ Chemistries  Recent Labs  Lab 11/11/17 1226  11/13/17 0703 11/14/17 0646 11/14/17 1056  NA 129*   < > 125* 122* 126*  K 4.5   < > 5.1 4.7  --   CL 87*   < > 93* 88*  --   CO2 21*   < > 17* 19*  --   GLUCOSE 214*   < > 181* 296*  --   BUN 55*   < > 85* 93*  --   CREATININE 4.51*   < >  5.38* 5.65*  --   CALCIUM 7.2*   < > 6.0* 5.8*  --   MG  --    < > 2.8*  --   --   AST 39  --   --   --   --   ALT 21  --   --   --   --   ALKPHOS 110  --   --   --   --   BILITOT 1.1  --   --   --   --    < > = values in this interval not displayed.   RADIOLOGY:  No results found. ASSESSMENT AND PLAN:   *Severe sepsis with septic  shock due to UTI and E. coli bacteremia.   The patient got fluid resuscitation as per sepsis protocol.  Hypotension improved. Started on IV aztreonam in the emergency room.   Continue meropenem.  Urine culture and blood culture show E. coli.  Follow-up CBC. sensitivity: Sensitive to many antibiotics.  *Acute kidney injury.  Likely due to severe sepsis and hypotension.    Worsening renal function. Renal ultrasound did not show any obstruction.  Continue IV fluid support, follow-up BMP.  Hold Vasotec and HCTZ.  Hyponatremia.  Worsening, per Dr. Juleen China, change IV fluids to hypertonic saline (3% saline) 66m/hr. Serial sodium checks.  Hypomagnesemia.  Improved with magnesium supplement.  *Diabetes mellitus.  Sliding scale insulin.  Hold metformin.  Lantus 10 units at bedtime with NovoLog 5 units 3 times daily.  *Chronic respiratory failure with COPD exacerbation.  Improved. Changed to prednisone p.o. and continue nebulizers.  Tobacco abuse.  Smoking cessation was counseled for 3 minutes.  Discussed with Dr. KJuleen China All the records are reviewed and case discussed with Care Management/Social Worker. Management plans discussed with the patient, sister and they are in agreement.  CODE STATUS: Full Code  TOTAL TIME TAKING CARE OF THIS PATIENT: 37 minutes.   More than 50% of the time was spent in counseling/coordination of care: YES  POSSIBLE D/C IN 3 DAYS, DEPENDING ON CLINICAL CONDITION.   QDemetrios LollM.D on 11/14/2017 at 12:51 PM  Between 7am to 6pm - Pager - [phone]  After 6pm go to [email or web address] - pPatent [email or web address]

## 2017-11-14 NOTE — Progress Notes (Signed)
MEDICATION RELATED CONSULT NOTE - INITIAL   Pharmacy Consult for hypertonic saline monitoring Indication: hypertonic saline order  Patient Measurements: Height: 5' 4"  (162.6 cm) Weight: 239 lb 1.6 oz (108.5 kg) IBW/kg (Calculated) : 54.7 Adjusted Body Weight:   Vital Signs: Temp: 97.5 F (36.4 C) (06/29 1210) Temp Source: Oral (06/29 1210) BP: 118/66 (06/29 1210) Pulse Rate: 82 (06/29 1210) Intake/Output from previous day: 06/28 0701 - 06/29 0700 In: 6060 [P.O.:360; I.V.:828; IV Piggyback:100] Out: 1500 [Urine:1500] Intake/Output from this shift: Total I/O In: 270 [I.V.:170; IV Piggyback:100] Out: 500 [Urine:500]  Labs: Recent Labs    11/12/17 0355 11/13/17 0703 11/14/17 0646  WBC 15.9* 15.1*  --   HGB 10.1* 10.6*  --   HCT 31.6* 32.1*  --   PLT 194 182  --   CREATININE 4.87* 5.38* 5.65*  MG 1.3* 2.8*  --   PHOS 8.9* 8.3* 7.5*  7.7*  ALBUMIN 2.1*  --  2.0*   Estimated Creatinine Clearance: 12.4 mL/min (A) (by C-G formula based on SCr of 5.65 mg/dL (H)).   Medical History: Past Medical History:  Diagnosis Date  . Arthritis   . Cancer ([place])    cervical  . COPD (chronic obstructive pulmonary disease) ([place])   . Depression   . Diabetes mellitus without complication ([place])   . GERD (gastroesophageal reflux disease)   . Hypertension   . Schizophrenia ([place])   . Sleep apnea     Medications:  Infusions:  . meropenem (MERREM) IV Stopped (11/14/17 1320)  . sodium chloride (hypertonic) 25 mL/hr (11/14/17 1103)    Assessment: 78 yof admitted for acute renal failure with PMH COPD, tobacco abuse, depression, GERD, T2DM, schizophrenia, HTN, OSA, anemia. Low sodium noted. Nephrology consulted and starts hypertonic saline for hyponatremia. Renal U/S negative. UA with proteinuria, many bacteria, leukocyte esterase moderate, hematuria. Notably PCT >150 on admission. Patient is on empiric antibiotics for urosepsis. Pharmacy consulted to monitor sodium levels while on  hypertonic saline.  Goal of Therapy:  Na 135 to 145 Increase of no more than 12 mEq/L per 24 hours to prevent demyelination  Plan:  Sodium chloride 3% to start at 25 mL/hr. This rate is appropriate for a peripheral line. Sodium levels ordered Q6H so pharmacy will follow levels as ordered at this time.   6/29 1700 Na: 128. Continue infusion at 97m/min. Next sodium level @ 2300.   Autumne Kallio M Dorethia Jeanmarie, Pharm.D., BCPS Clinical Pharmacist 11/14/2017,6:11 PM

## 2017-11-14 NOTE — Progress Notes (Signed)
MEDICATION RELATED CONSULT NOTE - INITIAL   Pharmacy Consult for hypertonic saline monitoring Indication: hypertonic saline order  Allergies  Allergen Reactions  . Iodine Swelling  . Penicillins Swelling    Has patient had a PCN reaction causing immediate rash, facial/tongue/throat swelling, SOB or lightheadedness with hypotension: Yes Has patient had a PCN reaction causing severe rash involving mucus membranes or skin necrosis: Yes Has patient had a PCN reaction that required hospitalization: No Has patient had a PCN reaction occurring within the last 10 years: No If all of the above answers are "NO", then may proceed with Cephalosporin use.     Patient Measurements: Height: 5' 4"  (162.6 cm) Weight: 239 lb 1.6 oz (108.5 kg) IBW/kg (Calculated) : 54.7 Adjusted Body Weight:   Vital Signs: Temp: 97.5 F (36.4 C) (06/29 1210) Temp Source: Oral (06/29 1210) BP: 118/66 (06/29 1210) Pulse Rate: 82 (06/29 1210) Intake/Output from previous day: 06/28 0701 - 06/29 0700 In: 3419 [P.O.:360; I.V.:828; IV Piggyback:100] Out: 1500 [Urine:1500] Intake/Output from this shift: No intake/output data recorded.  Labs: Recent Labs    11/11/17 1226 11/12/17 0355 11/13/17 0703 11/14/17 0646  WBC 17.1* 15.9* 15.1*  --   HGB 11.9* 10.1* 10.6*  --   HCT 37.3 31.6* 32.1*  --   PLT 242 194 182  --   CREATININE 4.51* 4.87* 5.38* 5.65*  MG  --  1.3* 2.8*  --   PHOS  --  8.9* 8.3* 7.5*  7.7*  ALBUMIN 2.9* 2.1*  --  2.0*  PROT 7.5  --   --   --   AST 39  --   --   --   ALT 21  --   --   --   ALKPHOS 110  --   --   --   BILITOT 1.1  --   --   --    Estimated Creatinine Clearance: 12.4 mL/min (A) (by C-G formula based on SCr of 5.65 mg/dL (H)).   Microbiology: Recent Results (from the past 720 hour(s))  Blood Culture (routine x 2)     Status: Abnormal   Collection Time: 11/11/17 12:26 PM  Result Value Ref Range Status   Specimen Description   Final    BLOOD LEFT  ANTECUBITAL Performed at [facility], [address] [place], [place] [address]    Special Requests   Final    BOTTLES DRAWN AEROBIC AND ANAEROBIC Blood Culture adequate volume Performed at [facility], [address] [place], [place] [address]    Culture  Setup Time   Final    IN BOTH AEROBIC AND ANAEROBIC BOTTLES GRAM NEGATIVE RODS CRITICAL RESULT CALLED TO, READ BACK BY AND VERIFIED WITH: C/ JASON ROBINS @0015  11/12/17 FLC Performed at [facility], [place] [address] [place], [place] [address]    Culture ESCHERICHIA COLI (A)  Final   Report Status 11/14/2017 FINAL  Final   Organism ID, Bacteria ESCHERICHIA COLI  Final      Susceptibility   Escherichia coli - MIC*    AMPICILLIN >=32 RESISTANT Resistant     CEFAZOLIN <=4 SENSITIVE Sensitive     CEFEPIME <=1 SENSITIVE Sensitive     CEFTAZIDIME <=1 SENSITIVE Sensitive     CEFTRIAXONE <=1 SENSITIVE Sensitive     CIPROFLOXACIN 1 SENSITIVE Sensitive     GENTAMICIN <=1 SENSITIVE Sensitive     IMIPENEM <=0.25 SENSITIVE Sensitive     TRIMETH/SULFA <=20 SENSITIVE Sensitive     AMPICILLIN/SULBACTAM >=32 RESISTANT Resistant  PIP/TAZO <=4 SENSITIVE Sensitive     Extended ESBL NEGATIVE Sensitive     * ESCHERICHIA COLI  Blood Culture ID Panel (Reflexed)     Status: Abnormal   Collection Time: 11/11/17 12:26 PM  Result Value Ref Range Status   Enterococcus species NOT DETECTED NOT DETECTED Final   Listeria monocytogenes NOT DETECTED NOT DETECTED Final   Staphylococcus species NOT DETECTED NOT DETECTED Final   Staphylococcus aureus NOT DETECTED NOT DETECTED Final   Streptococcus species NOT DETECTED NOT DETECTED Final   Streptococcus agalactiae NOT DETECTED NOT DETECTED Final   Streptococcus pneumoniae NOT DETECTED NOT DETECTED Final   Streptococcus pyogenes NOT DETECTED NOT DETECTED Final   Acinetobacter baumannii NOT DETECTED NOT DETECTED Final   Enterobacteriaceae species DETECTED (A) NOT DETECTED Final     Comment: Enterobacteriaceae represent a large family of gram-negative bacteria, not a single organism. C/ JASON ROBINS @0015  11/12/17 FLC    Enterobacter cloacae complex NOT DETECTED NOT DETECTED Final   Escherichia coli DETECTED (A) NOT DETECTED Final    Comment: C/ JASON ROBINS @0015  11/12/17 FLC   Klebsiella oxytoca NOT DETECTED NOT DETECTED Final   Klebsiella pneumoniae NOT DETECTED NOT DETECTED Final   Proteus species NOT DETECTED NOT DETECTED Final   Serratia marcescens NOT DETECTED NOT DETECTED Final   Carbapenem resistance NOT DETECTED NOT DETECTED Final   Haemophilus influenzae NOT DETECTED NOT DETECTED Final   Neisseria meningitidis NOT DETECTED NOT DETECTED Final   Pseudomonas aeruginosa NOT DETECTED NOT DETECTED Final   Candida albicans NOT DETECTED NOT DETECTED Final   Candida glabrata NOT DETECTED NOT DETECTED Final   Candida krusei NOT DETECTED NOT DETECTED Final   Candida parapsilosis NOT DETECTED NOT DETECTED Final   Candida tropicalis NOT DETECTED NOT DETECTED Final    Comment: Performed at [facility], [address], [place], [place] [address]  Blood Culture (routine x 2)     Status: Abnormal   Collection Time: 11/11/17 12:31 PM  Result Value Ref Range Status   Specimen Description   Final    BLOOD RIGHT ANTECUBITAL Performed at [facility], [address] [place], [place] [address]    Special Requests   Final    BOTTLES DRAWN AEROBIC AND ANAEROBIC Blood Culture results may not be optimal due to an excessive volume of blood received in culture bottles Performed at [facility], [address] [place], [place] [address]    Culture  Setup Time   Final    IN BOTH AEROBIC AND ANAEROBIC BOTTLES GRAM NEGATIVE RODS CRITICAL VALUE NOTED.  VALUE IS CONSISTENT WITH PREVIOUSLY REPORTED AND CALLED VALUE. Performed at [facility], [address] [place], [place] [address]    Culture (A)  Final    ESCHERICHIA  COLI SUSCEPTIBILITIES PERFORMED ON PREVIOUS CULTURE WITHIN THE LAST 5 DAYS. Performed at [facility], [place] [address], [place], [place] [address]    Report Status 11/14/2017 FINAL  Final  MRSA PCR Screening     Status: None   Collection Time: 11/11/17  8:28 PM  Result Value Ref Range Status   MRSA by PCR NEGATIVE NEGATIVE Final    Comment:        The GeneXpert MRSA Assay (FDA approved for NASAL specimens only), is one component of a comprehensive MRSA colonization surveillance program. It is not intended to diagnose MRSA infection nor to guide or monitor treatment for MRSA infections. Performed at [facility], [address] [place], [place] [address]  Medical History: Past Medical History:  Diagnosis Date  . Arthritis   . Cancer ([place])    cervical  . COPD (chronic obstructive pulmonary disease) ([place])   . Depression   . Diabetes mellitus without complication ([place])   . GERD (gastroesophageal reflux disease)   . Hypertension   . Schizophrenia ([place])   . Sleep apnea     Medications:  Infusions:  . meropenem (MERREM) IV Stopped (11/14/17 0232)  . sodium chloride (hypertonic) 25 mL/hr (11/14/17 1103)    Assessment: 4 yof admitted for acute renal failure with PMH COPD, tobacco abuse, depression, GERD, T2DM, schizophrenia, HTN, OSA, anemia. Low sodium noted. Nephrology consulted and starts hypertonic saline for hyponatremia. Renal U/S negative. UA with proteinuria, many bacteria, leukocyte esterase moderate, hematuria. Notably PCT >150 on admission. Patient is on empiric antibiotics for urosepsis. Pharmacy consulted to monitor sodium levels while on hypertonic saline.  Goal of Therapy:  Na 135 to 145 Increase of no more than 12 mEq/L per 24 hours to prevent demyelination  Plan:  Sodium chloride 3% to start at 25 mL/hr. This rate is appropriate for a peripheral line. Sodium levels ordered Q6H so pharmacy will follow levels as ordered at this time.    Laural Benes, Pharm.D., BCPS Clinical Pharmacist 11/14/2017,12:14 PM

## 2017-11-15 ENCOUNTER — Inpatient Hospital Stay: Payer: Medicare Other

## 2017-11-15 LAB — BLOOD GAS, ARTERIAL
Acid-base deficit: 4 mmol/L — ABNORMAL HIGH (ref 0.0–2.0)
Bicarbonate: 23.3 mmol/L (ref 20.0–28.0)
FIO2: 0.28
Mode: POSITIVE
O2 SAT: 87.4 %
PCO2 ART: 52 mmHg — AB (ref 32.0–48.0)
PH ART: 7.26 — AB (ref 7.350–7.450)
PO2 ART: 62 mmHg — AB (ref 83.0–108.0)
Patient temperature: 37

## 2017-11-15 LAB — CBC
HCT: 31.4 % — ABNORMAL LOW (ref 35.0–47.0)
Hemoglobin: 10.5 g/dL — ABNORMAL LOW (ref 12.0–16.0)
MCH: 27.4 pg (ref 26.0–34.0)
MCHC: 33.5 g/dL (ref 32.0–36.0)
MCV: 81.9 fL (ref 80.0–100.0)
Platelets: 178 10*3/uL (ref 150–440)
RBC: 3.84 MIL/uL (ref 3.80–5.20)
RDW: 15.9 % — AB (ref 11.5–14.5)
WBC: 12.1 10*3/uL — AB (ref 3.6–11.0)

## 2017-11-15 LAB — COMPREHENSIVE METABOLIC PANEL
ALT: 13 U/L (ref 0–44)
AST: 12 U/L — AB (ref 15–41)
Albumin: 1.9 g/dL — ABNORMAL LOW (ref 3.5–5.0)
Alkaline Phosphatase: 71 U/L (ref 38–126)
Anion gap: 12 (ref 5–15)
BUN: 98 mg/dL — AB (ref 8–23)
CHLORIDE: 94 mmol/L — AB (ref 98–111)
CO2: 24 mmol/L (ref 22–32)
Calcium: 6.5 mg/dL — ABNORMAL LOW (ref 8.9–10.3)
Creatinine, Ser: 5.58 mg/dL — ABNORMAL HIGH (ref 0.44–1.00)
GFR calc Af Amer: 9 mL/min — ABNORMAL LOW (ref >60)
GFR calc non Af Amer: 7 mL/min — ABNORMAL LOW (ref >60)
GLUCOSE: 103 mg/dL — AB (ref 70–99)
Potassium: 5 mmol/L (ref 3.5–5.1)
SODIUM: 130 mmol/L — AB (ref 135–145)
Total Bilirubin: 0.3 mg/dL (ref 0.3–1.2)
Total Protein: 5.9 g/dL — ABNORMAL LOW (ref 6.5–8.1)

## 2017-11-15 LAB — BASIC METABOLIC PANEL
ANION GAP: 13 (ref 5–15)
BUN: 102 mg/dL — ABNORMAL HIGH (ref 8–23)
CALCIUM: 6.7 mg/dL — AB (ref 8.9–10.3)
CO2: 25 mmol/L (ref 22–32)
Chloride: 94 mmol/L — ABNORMAL LOW (ref 98–111)
Creatinine, Ser: 5.72 mg/dL — ABNORMAL HIGH (ref 0.44–1.00)
GFR calc Af Amer: 8 mL/min — ABNORMAL LOW (ref >60)
GFR, EST NON AFRICAN AMERICAN: 7 mL/min — AB (ref >60)
GLUCOSE: 124 mg/dL — AB (ref 70–99)
Potassium: 5.6 mmol/L — ABNORMAL HIGH (ref 3.5–5.1)
SODIUM: 132 mmol/L — AB (ref 135–145)

## 2017-11-15 LAB — GLUCOSE, CAPILLARY
GLUCOSE-CAPILLARY: 93 mg/dL (ref 70–99)
Glucose-Capillary: 100 mg/dL — ABNORMAL HIGH (ref 70–99)
Glucose-Capillary: 118 mg/dL — ABNORMAL HIGH (ref 70–99)
Glucose-Capillary: 221 mg/dL — ABNORMAL HIGH (ref 70–99)

## 2017-11-15 LAB — POTASSIUM: Potassium: 6 mmol/L — ABNORMAL HIGH (ref 3.5–5.1)

## 2017-11-15 MED ORDER — DEXTROSE 50 % IV SOLN
12.5000 g | Freq: Once | INTRAVENOUS | Status: AC
  Administered 2017-11-16: 12.5 g via INTRAVENOUS
  Filled 2017-11-15: qty 50

## 2017-11-15 MED ORDER — SODIUM POLYSTYRENE SULFONATE 15 GM/60ML PO SUSP
30.0000 g | Freq: Once | ORAL | Status: AC
  Administered 2017-11-16: 30 g via ORAL
  Filled 2017-11-15 (×2): qty 120

## 2017-11-15 MED ORDER — PHENOL 1.4 % MT LIQD
1.0000 | OROMUCOSAL | Status: DC | PRN
  Administered 2017-11-15: 1 via OROMUCOSAL
  Filled 2017-11-15: qty 177

## 2017-11-15 MED ORDER — DEXTROSE 50 % IV SOLN: 25.0000 g | Freq: Once | INTRAVENOUS | Status: DC

## 2017-11-15 MED ORDER — SODIUM CHLORIDE 0.9 % IV SOLN
1.0000 g | Freq: Once | INTRAVENOUS | Status: AC
  Administered 2017-11-16: 1 g via INTRAVENOUS
  Filled 2017-11-15: qty 10

## 2017-11-15 MED ORDER — SODIUM CHLORIDE 0.9 % IV SOLN
INTRAVENOUS | Status: DC
  Administered 2017-11-15 (×2): via INTRAVENOUS

## 2017-11-15 MED ORDER — INSULIN REGULAR HUMAN 100 UNIT/ML IJ SOLN
10.0000 [IU] | Freq: Once | INTRAMUSCULAR | Status: AC
  Administered 2017-11-16: 10 [IU] via INTRAVENOUS
  Filled 2017-11-15: qty 0.1

## 2017-11-15 NOTE — Progress Notes (Signed)
Patient with ams. Sister at bedside. States patient has been lethargic and not herself throughout most of the day.  RN states o2 has been at 2 liters and then at 4 liters but have not found out who/why o2 has been increased.  Patient has been been compliant with wearing the cpap but continues to remain lethargic. Patient awakes when aroused but tends to drift back off. Words are sluddered at times. SVN given. o2 saturation noted 96 on 2liters. Placed back on cpap. RN to call MD for abg order

## 2017-11-15 NOTE — Progress Notes (Signed)
Decreased to 2l bleed into cpap

## 2017-11-15 NOTE — Progress Notes (Signed)
Spoke to Dr Jannifer [place] regarding ABG and mental status for this patient.  Received verbal order to place patient on bipap.  Patient on bipap tolerating well. Sister at bedside.

## 2017-11-15 NOTE — Progress Notes (Signed)
MEDICATION RELATED CONSULT NOTE - INITIAL   Pharmacy Consult for hypertonic saline monitoring Indication: hypertonic saline order  Patient Measurements: Height: 5' 4"  (162.6 cm) Weight: 239 lb 1.6 oz (108.5 kg) IBW/kg (Calculated) : 54.7 Adjusted Body Weight:   Vital Signs: Temp: 97.6 F (36.4 C) (06/30 0523) Temp Source: Oral (06/30 0523) BP: 131/71 (06/30 0523) Pulse Rate: 83 (06/30 0523) Intake/Output from previous day: 06/29 0701 - 06/30 0700 In: 340.7 [I.V.:240.7; IV Piggyback:100] Out: 900 [Urine:900] Intake/Output from this shift: Total I/O In: 70.7 [I.V.:70.7] Out: 400 [Urine:400]  Labs: Recent Labs    11/13/17 0703 11/14/17 0646 11/15/17 0417  WBC 15.1*  --  12.1*  HGB 10.6*  --  10.5*  HCT 32.1*  --  31.4*  PLT 182  --  178  CREATININE 5.38* 5.65* 5.58*  MG 2.8*  --   --   PHOS 8.3* 7.5*  7.7*  --   ALBUMIN  --  2.0* 1.9*  PROT  --   --  5.9*  AST  --   --  12*  ALT  --   --  13  ALKPHOS  --   --  71  BILITOT  --   --  0.3   Estimated Creatinine Clearance: 12.6 mL/min (A) (by C-G formula based on SCr of 5.58 mg/dL (H)).   Medical History: Past Medical History:  Diagnosis Date  . Arthritis   . Cancer ([place])    cervical  . COPD (chronic obstructive pulmonary disease) ([place])   . Depression   . Diabetes mellitus without complication ([place])   . GERD (gastroesophageal reflux disease)   . Hypertension   . Schizophrenia ([place])   . Sleep apnea     Medications:  Infusions:  . meropenem (MERREM) IV Stopped (11/15/17 0131)  . sodium chloride (hypertonic) 25 mL/hr (11/14/17 2320)    Assessment: 56 yof admitted for acute renal failure with PMH COPD, tobacco abuse, depression, GERD, T2DM, schizophrenia, HTN, OSA, anemia. Low sodium noted. Nephrology consulted and starts hypertonic saline for hyponatremia. Renal U/S negative. UA with proteinuria, many bacteria, leukocyte esterase moderate, hematuria. Notably PCT >150 on admission. Patient is on empiric  antibiotics for urosepsis. Pharmacy consulted to monitor sodium levels while on hypertonic saline.  Goal of Therapy:  Na 135 to 145 Increase of no more than 12 mEq/L per 24 hours to prevent demyelination  Plan:  Sodium chloride 3% to start at 25 mL/hr. This rate is appropriate for a peripheral line. Sodium levels ordered Q6H so pharmacy will follow levels as ordered at this time.   6/29 1700 Na: 128. Continue infusion at 41m/min. Next sodium level @ 2300.   6/29 :  Na level @ 22:26 = 129.  Will continue infusion at current rate and recheck Na on 6/30 @ 0400.  6/30:  Na level @ 0417 = 130.  Will continue infusion at current rate and recheck Na on 6/30 @ 10:00.   Cayde Held D, Pharm.D Clinical Pharmacist 11/15/2017,6:03 AM

## 2017-11-15 NOTE — Progress Notes (Signed)
[facility]  ROUNDING NOTE   Subjective:   Na 130 (129) Hypertonic saline infusion.    Objective:  Vital signs in last 24 hours:  Temp:  [97.5 F (36.4 C)-98.1 F (36.7 C)] 97.6 F (36.4 C) (06/30 0523) Pulse Rate:  [82-98] 83 (06/30 0523) Resp:  [18-20] 20 (06/29 2300) BP: (104-131)/(66-86) 131/71 (06/30 0523) SpO2:  [90 %-100 %] 100 % (06/30 0733)  Weight change:  Filed Weights   11/11/17 1204 11/11/17 2014 11/13/17 0500  Weight: 92.5 kg (204 lb) 92 kg (202 lb 13.2 oz) 108.5 kg (239 lb 1.6 oz)    Intake/Output: I/O last 3 completed shifts: In: 638.7 [I.V.:538.7; IV Piggyback:100] Out: 1600 [HCWCB:7628]   Intake/Output this shift:  No intake/output data recorded.  Physical Exam: General: NAD,   Head: Normocephalic, atraumatic. Moist oral mucosal membranes, Hard of hearing  Eyes: Anicteric, PERRL  Neck: Supple, trachea midline  Lungs:  Clear to auscultation  Heart: Regular rate and rhythm  Abdomen:  Soft, nontender, obese  Extremities: no peripheral edema.  Neurologic: Nonfocal, moving all four extremities  Skin: No lesions        Basic Metabolic Panel: Recent Labs  Lab 11/11/17 1226 11/12/17 0355 11/13/17 0703 11/14/17 0646 11/14/17 1056 11/14/17 1621 11/14/17 2226 11/15/17 0417  NA 129* 129* 125* 122* 126* 128* 129* 130*  K 4.5 5.0 5.1 4.7  --   --   --  5.0  CL 87* 94* 93* 88*  --   --   --  94*  CO2 21* 18* 17* 19*  --   --   --  24  GLUCOSE 214* 236* 181* 296*  --   --   --  103*  BUN 55* 70* 85* 93*  --   --   --  98*  CREATININE 4.51* 4.87* 5.38* 5.65*  --   --   --  5.58*  CALCIUM 7.2* 5.9* 6.0* 5.8*  --   --   --  6.5*  MG  --  1.3* 2.8*  --   --   --   --   --   PHOS  --  8.9* 8.3* 7.5*  7.7*  --   --   --   --     Liver Function Tests: Recent Labs  Lab 11/11/17 1226 11/12/17 0355 11/14/17 0646 11/15/17 0417  AST 39  --   --  12*  ALT 21  --   --  13  ALKPHOS 110  --   --  71  BILITOT 1.1  --   --  0.3  PROT  7.5  --   --  5.9*  ALBUMIN 2.9* 2.1* 2.0* 1.9*   Recent Labs  Lab 11/11/17 1226  LIPASE 16   No results for input(s): AMMONIA in the last 168 hours.  CBC: Recent Labs  Lab 11/11/17 1226 11/12/17 0355 11/13/17 0703 11/15/17 0417  WBC 17.1* 15.9* 15.1* 12.1*  NEUTROABS 16.4*  --   --   --   HGB 11.9* 10.1* 10.6* 10.5*  HCT 37.3 31.6* 32.1* 31.4*  MCV 83.8 84.0 83.1 81.9  PLT 242 194 182 178    Cardiac Enzymes: Recent Labs  Lab 11/11/17 1226 11/11/17 1402 11/11/17 2048 11/12/17 0355 11/12/17 1044  CKTOTAL  --  36*  --   --   --   TROPONINI 0.03*  --  <0.03 <0.03 0.03*    BNP: Invalid input(s): POCBNP  CBG: Recent Labs  Lab 11/14/17 0747 11/14/17 1210  11/14/17 1653 11/14/17 2127 11/15/17 0754  GLUCAP 289* 291* 165* 135* 93    Microbiology: Results for orders placed or performed during the hospital encounter of 11/11/17  Blood Culture (routine x 2)     Status: Abnormal   Collection Time: 11/11/17 12:26 PM  Result Value Ref Range Status   Specimen Description   Final    BLOOD LEFT ANTECUBITAL Performed at [facility], [address], [place], [place] [address]    Special Requests   Final    BOTTLES DRAWN AEROBIC AND ANAEROBIC Blood Culture adequate volume Performed at [facility], [address] [place], [place] [address]    Culture  Setup Time   Final    IN BOTH AEROBIC AND ANAEROBIC BOTTLES GRAM NEGATIVE RODS CRITICAL RESULT CALLED TO, READ BACK BY AND VERIFIED WITH: C/ JASON ROBINS @0015  11/12/17 FLC Performed at [facility], [place] [address] [place], [place] [address]    Culture ESCHERICHIA COLI (A)  Final   Report Status 11/14/2017 FINAL  Final   Organism ID, Bacteria ESCHERICHIA COLI  Final      Susceptibility   Escherichia coli - MIC*    AMPICILLIN >=32 RESISTANT Resistant     CEFAZOLIN <=4 SENSITIVE Sensitive     CEFEPIME <=1 SENSITIVE Sensitive     CEFTAZIDIME <=1 SENSITIVE Sensitive     CEFTRIAXONE <=1  SENSITIVE Sensitive     CIPROFLOXACIN 1 SENSITIVE Sensitive     GENTAMICIN <=1 SENSITIVE Sensitive     IMIPENEM <=0.25 SENSITIVE Sensitive     TRIMETH/SULFA <=20 SENSITIVE Sensitive     AMPICILLIN/SULBACTAM >=32 RESISTANT Resistant     PIP/TAZO <=4 SENSITIVE Sensitive     Extended ESBL NEGATIVE Sensitive     * ESCHERICHIA COLI  Blood Culture ID Panel (Reflexed)     Status: Abnormal   Collection Time: 11/11/17 12:26 PM  Result Value Ref Range Status   Enterococcus species NOT DETECTED NOT DETECTED Final   Listeria monocytogenes NOT DETECTED NOT DETECTED Final   Staphylococcus species NOT DETECTED NOT DETECTED Final   Staphylococcus aureus NOT DETECTED NOT DETECTED Final   Streptococcus species NOT DETECTED NOT DETECTED Final   Streptococcus agalactiae NOT DETECTED NOT DETECTED Final   Streptococcus pneumoniae NOT DETECTED NOT DETECTED Final   Streptococcus pyogenes NOT DETECTED NOT DETECTED Final   Acinetobacter baumannii NOT DETECTED NOT DETECTED Final   Enterobacteriaceae species DETECTED (A) NOT DETECTED Final    Comment: Enterobacteriaceae represent a large family of gram-negative bacteria, not a single organism. C/ JASON ROBINS @0015  11/12/17 FLC    Enterobacter cloacae complex NOT DETECTED NOT DETECTED Final   Escherichia coli DETECTED (A) NOT DETECTED Final    Comment: C/ JASON ROBINS @0015  11/12/17 FLC   Klebsiella oxytoca NOT DETECTED NOT DETECTED Final   Klebsiella pneumoniae NOT DETECTED NOT DETECTED Final   Proteus species NOT DETECTED NOT DETECTED Final   Serratia marcescens NOT DETECTED NOT DETECTED Final   Carbapenem resistance NOT DETECTED NOT DETECTED Final   Haemophilus influenzae NOT DETECTED NOT DETECTED Final   Neisseria meningitidis NOT DETECTED NOT DETECTED Final   Pseudomonas aeruginosa NOT DETECTED NOT DETECTED Final   Candida albicans NOT DETECTED NOT DETECTED Final   Candida glabrata NOT DETECTED NOT DETECTED Final   Candida krusei NOT DETECTED NOT  DETECTED Final   Candida parapsilosis NOT DETECTED NOT DETECTED Final   Candida tropicalis NOT DETECTED NOT DETECTED Final    Comment: Performed at [facility], [address] [place], [place] [address]  Blood Culture (routine x 2)     Status: Abnormal   Collection Time: 11/11/17 12:31 PM  Result Value Ref Range Status   Specimen Description   Final    BLOOD RIGHT ANTECUBITAL Performed at [facility], [address] [place], [place] [address]    Special Requests   Final    BOTTLES DRAWN AEROBIC AND ANAEROBIC Blood Culture results may not be optimal due to an excessive volume of blood received in culture bottles Performed at [facility], [address] [place], [place] [address]    Culture  Setup Time   Final    IN BOTH AEROBIC AND ANAEROBIC BOTTLES GRAM NEGATIVE RODS CRITICAL VALUE NOTED.  VALUE IS CONSISTENT WITH PREVIOUSLY REPORTED AND CALLED VALUE. Performed at [facility], [address] [place], [place] [address]    Culture (A)  Final    ESCHERICHIA COLI SUSCEPTIBILITIES PERFORMED ON PREVIOUS CULTURE WITHIN THE LAST 5 DAYS. Performed at [facility], [place] [address], [place], [place] [address]    Report Status 11/14/2017 FINAL  Final  MRSA PCR Screening     Status: None   Collection Time: 11/11/17  8:28 PM  Result Value Ref Range Status   MRSA by PCR NEGATIVE NEGATIVE Final    Comment:        The GeneXpert MRSA Assay (FDA approved for NASAL specimens only), is one component of a comprehensive MRSA colonization surveillance program. It is not intended to diagnose MRSA infection nor to guide or monitor treatment for MRSA infections. Performed at [facility], [address] [place],  [address]     Coagulation Studies: No results for input(s): LABPROT, INR in the last 72 hours.  Urinalysis: No results for input(s): COLORURINE, LABSPEC, PHURINE, GLUCOSEU, HGBUR, BILIRUBINUR, KETONESUR, PROTEINUR,  UROBILINOGEN, NITRITE, LEUKOCYTESUR in the last 72 hours.  Invalid input(s): APPERANCEUR    Imaging: No results found.   Medications:   . sodium chloride 75 mL/hr at 11/15/17 0939  . meropenem (MERREM) IV Stopped (11/15/17 0131)   . budesonide (PULMICORT) nebulizer solution  0.25 mg Nebulization BID  . calcium carbonate  800 mg of elemental calcium Oral BID  . gabapentin  100 mg Oral TID  . heparin injection (subcutaneous)  5,000 Units Subcutaneous Q8H  . insulin aspart  0-15 Units Subcutaneous TID WC  . insulin aspart  0-5 Units Subcutaneous QHS  . insulin aspart  5 Units Subcutaneous TID WC  . insulin glargine  10 Units Subcutaneous QHS  . ipratropium-albuterol  3 mL Nebulization Q6H  . nicotine  14 mg Transdermal Daily  . predniSONE  30 mg Oral Q breakfast  . risperiDONE  2 mg Oral Daily   acetaminophen **OR** acetaminophen, albuterol, diphenhydrAMINE, ondansetron **OR** ondansetron (ZOFRAN) IV, oxyCODONE-acetaminophen, polyethylene glycol, traMADol  Assessment/ Plan:  Ms. Deanna Gay is a 62 y.o. black female with COPD, tobacco abuse, depression, GERD, diabetes mellitus type II, schizophrenia, hypertension, obstructive sleep apnea, anemia who was admitted to [facility] on 11/11/2017 for acute renal failure  1. Acute renal failure with metabolic acidosis, hyponatremia, hypocalcemia: baseline creatinine of 0.8 on 10/08/17.  Ultrasound negative.  Urinalysis with hematuria and proteinuria.  - Change IV fluids from hypertonic saline (3% saline) to NS    2. Urinary tract infection with sepsis and bacteremia: E. Coli on blood and urine cultures 6/26.  -  meropenem.   3. 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 Not well controlled.  hemoglobin A1c of 8.2% on 10/08/17.   4. Anemia with renal failure: hemoglobin 10.5   LOS: 4 Rian Koon 6/30/201911:41 AM

## 2017-11-15 NOTE — Progress Notes (Signed)
[place] [facility] [place] NAME: Deanna Gay    MR#:  [id number]  DATE OF BIRTH:  06-17-1955  SUBJECTIVE:  CHIEF COMPLAINT:   Chief Complaint  Patient presents with  . Hypotension   The patient has generalized weakness. REVIEW OF SYSTEMS:  Review of Systems  Constitutional: Positive for malaise/fatigue. Negative for chills and fever.  HENT: Negative for sore throat.   Eyes: Negative for blurred vision and double vision.  Respiratory: Negative for cough, hemoptysis, shortness of breath, wheezing and stridor.   Cardiovascular: Negative for chest pain, palpitations, orthopnea and leg swelling.  Gastrointestinal: Negative for abdominal pain, blood in stool, diarrhea, melena, nausea and vomiting.  Genitourinary: Negative for dysuria, flank pain, frequency, hematuria and urgency.  Musculoskeletal: Negative for back pain and joint pain.  Skin: Negative for itching and rash.  Neurological: Negative for dizziness, sensory change, focal weakness, seizures, loss of consciousness, weakness and headaches.  Endo/Heme/Allergies: Negative for polydipsia.  Psychiatric/Behavioral: Negative for depression. The patient is not nervous/anxious.     DRUG ALLERGIES:   Allergies  Allergen Reactions  . Iodine Swelling  . Penicillins Swelling    Has patient had a PCN reaction causing immediate rash, facial/tongue/throat swelling, SOB or lightheadedness with hypotension: Yes Has patient had a PCN reaction causing severe rash involving mucus membranes or skin necrosis: Yes Has patient had a PCN reaction that required hospitalization: No Has patient had a PCN reaction occurring within the last 10 years: No If all of the above answers are "NO", then may proceed with Cephalosporin use.    VITALS:  Blood pressure 134/69, pulse 99, temperature 98.4 F (36.9 C), temperature source Oral, resp. rate 17, height 5' 4"  (1.626 m), weight 239 lb 1.6 oz (108.5 kg), SpO2  (!) 88 %. PHYSICAL EXAMINATION:  Physical Exam  Constitutional: She is oriented to person, place, and time. She appears well-developed.  Morbid Obesity.  HENT:  Head: Normocephalic.  Mouth/Throat: Oropharynx is clear and moist.  Eyes: Pupils are equal, round, and reactive to light. Conjunctivae and EOM are normal. No scleral icterus.  Neck: Normal range of motion. Neck supple. No JVD present. No tracheal deviation present.  Cardiovascular: Normal rate, regular rhythm and normal heart sounds. Exam reveals no gallop.  No murmur heard. Pulmonary/Chest: Effort normal and breath sounds normal. No respiratory distress. She has no wheezes. She has no rales.  Abdominal: Soft. Bowel sounds are normal. She exhibits no distension. There is no tenderness. There is no rebound.  Musculoskeletal: Normal range of motion. She exhibits no edema or tenderness.  Neurological: She is alert and oriented to person, place, and time. No cranial nerve deficit.  Skin: No rash noted. No erythema.  Psychiatric: She has a normal mood and affect.   LABORATORY PANEL:  Female CBC Recent Labs  Lab 11/15/17 0417  WBC 12.1*  HGB 10.5*  HCT 31.4*  PLT 178   ------------------------------------------------------------------------------------------------------------------ Chemistries  Recent Labs  Lab 11/13/17 0703  11/15/17 0417  NA 125*   < > 130*  K 5.1   < > 5.0  CL 93*   < > 94*  CO2 17*   < > 24  GLUCOSE 181*   < > 103*  BUN 85*   < > 98*  CREATININE 5.38*   < > 5.58*  CALCIUM 6.0*   < > 6.5*  MG 2.8*  --   --   AST  --   --  12*  ALT  --   --  13  ALKPHOS  --   --  71  BILITOT  --   --  0.3   < > = values in this interval not displayed.   RADIOLOGY:  No results found. ASSESSMENT AND PLAN:   *Severe sepsis with septic shock due to UTI and E. coli bacteremia.   The patient got fluid resuscitation as per sepsis protocol.  Hypotension improved. Started on IV aztreonam in the emergency room.     Continue meropenem.  Urine culture and blood culture show E. coli.  Follow-up CBC. sensitivity: Sensitive to many antibiotics.  *Acute kidney injury.  Likely due to severe sepsis and hypotension.    Worsening renal function. Renal ultrasound did not show any obstruction.  Change from hypertonic saline to normal saline per Dr. Juleen China, follow-up BMP.  Hold Vasotec and HCTZ.  Hyponatremia.  Improving with hypertonic saline, change from hypertonic saline to normal saline per Dr. Juleen China.  Hypomagnesemia.  Improved with magnesium supplement.  *Diabetes mellitus.  Sliding scale insulin.  Hold metformin.  Lantus 10 units at bedtime with NovoLog 5 units 3 times daily.  *Chronic respiratory failure with COPD exacerbation.  Improved. Changed to prednisone p.o. and continue nebulizers.  Tobacco abuse.  Smoking cessation was counseled for 3 minutes.  Morbid obesity and OSA.  CPAP at night.  Discussed with Dr. Juleen China. All the records are reviewed and case discussed with Care Management/Social Worker. Management plans discussed with the patient, daughter-in-law and they are in agreement.  CODE STATUS: Full Code  TOTAL TIME TAKING CARE OF THIS PATIENT: 38 minutes.   More than 50% of the time was spent in counseling/coordination of care: YES  POSSIBLE D/C IN 3 DAYS, DEPENDING ON CLINICAL CONDITION.   Demetrios Loll M.D on 11/15/2017 at 12:57 PM  Between 7am to 6pm - Pager - [phone]  After 6pm go to [email or web address] - [facility]

## 2017-11-16 ENCOUNTER — Encounter
Admission: EM | Disposition: A | Discharge status: Home Health | Payer: Self-pay | Source: Home / Self Care | Attending: Internal Medicine

## 2017-11-16 ENCOUNTER — Inpatient Hospital Stay: Payer: Medicare Other

## 2017-11-16 DIAGNOSIS — A419 Sepsis, unspecified organism: Secondary | ICD-10-CM

## 2017-11-16 DIAGNOSIS — R7881 Bacteremia: Secondary | ICD-10-CM

## 2017-11-16 DIAGNOSIS — J9601 Acute respiratory failure with hypoxia: Secondary | ICD-10-CM

## 2017-11-16 DIAGNOSIS — R6520 Severe sepsis without septic shock: Secondary | ICD-10-CM

## 2017-11-16 LAB — GLUCOSE, CAPILLARY
GLUCOSE-CAPILLARY: 125 mg/dL — AB (ref 70–99)
GLUCOSE-CAPILLARY: 260 mg/dL — AB (ref 70–99)
GLUCOSE-CAPILLARY: 277 mg/dL — AB (ref 70–99)
Glucose-Capillary: 109 mg/dL — ABNORMAL HIGH (ref 70–99)
Glucose-Capillary: 255 mg/dL — ABNORMAL HIGH (ref 70–99)
Glucose-Capillary: 165 mg/dL — ABNORMAL HIGH (ref 70–99)

## 2017-11-16 LAB — BASIC METABOLIC PANEL
ANION GAP: 11 (ref 5–15)
Anion gap: 12 (ref 5–15)
BUN: 100 mg/dL — ABNORMAL HIGH (ref 8–23)
BUN: 106 mg/dL — ABNORMAL HIGH (ref 8–23)
CALCIUM: 7 mg/dL — AB (ref 8.9–10.3)
CHLORIDE: 98 mmol/L (ref 98–111)
CO2: 22 mmol/L (ref 22–32)
CO2: 23 mmol/L (ref 22–32)
CREATININE: 5.48 mg/dL — AB (ref 0.44–1.00)
Calcium: 7.1 mg/dL — ABNORMAL LOW (ref 8.9–10.3)
Chloride: 101 mmol/L (ref 98–111)
Creatinine, Ser: 5.68 mg/dL — ABNORMAL HIGH (ref 0.44–1.00)
GFR calc non Af Amer: 7 mL/min — ABNORMAL LOW (ref >60)
GFR, EST AFRICAN AMERICAN: 8 mL/min — AB (ref >60)
GFR, EST AFRICAN AMERICAN: 9 mL/min — AB (ref >60)
GFR, EST NON AFRICAN AMERICAN: 8 mL/min — AB (ref >60)
Glucose, Bld: 107 mg/dL — ABNORMAL HIGH (ref 70–99)
Glucose, Bld: 322 mg/dL — ABNORMAL HIGH (ref 70–99)
Potassium: 5.9 mmol/L — ABNORMAL HIGH (ref 3.5–5.1)
Potassium: 6.2 mmol/L — ABNORMAL HIGH (ref 3.5–5.1)
SODIUM: 134 mmol/L — AB (ref 135–145)
SODIUM: 133 mmol/L — AB (ref 135–145)

## 2017-11-16 LAB — BLOOD GAS, ARTERIAL
Acid-base deficit: 3.5 mmol/L — ABNORMAL HIGH (ref 0.0–2.0)
BICARBONATE: 24.4 mmol/L (ref 20.0–28.0)
FIO2: 0.28
O2 Saturation: 87.7 %
PATIENT TEMPERATURE: 37
PCO2 ART: 57 mmHg — AB (ref 32.0–48.0)
PO2 ART: 64 mmHg — AB (ref 83.0–108.0)
pH, Arterial: 7.24 — ABNORMAL LOW (ref 7.350–7.450)

## 2017-11-16 LAB — PARATHYROID HORMONE, INTACT (NO CA): PTH: 90 pg/mL — ABNORMAL HIGH (ref 15–65)

## 2017-11-16 LAB — PHOSPHORUS: Phosphorus: 7.6 mg/dL — ABNORMAL HIGH (ref 2.5–4.6)

## 2017-11-16 SURGERY — DIALYSIS/PERMA CATHETER INSERTION
Anesthesia: LOCAL

## 2017-11-16 MED ORDER — DEXTROSE 50 % IV SOLN
25.0000 mL | Freq: Once | INTRAVENOUS | Status: AC
  Administered 2017-11-16: 25 mL via INTRAVENOUS
  Filled 2017-11-16: qty 50

## 2017-11-16 MED ORDER — LIDOCAINE HCL 1 % IJ SOLN
10.0000 mL | Freq: Once | INTRAMUSCULAR | Status: DC
  Filled 2017-11-16: qty 10

## 2017-11-16 MED ORDER — SODIUM POLYSTYRENE SULFONATE 15 GM/60ML PO SUSP: 30.0000 g | Freq: Once | ORAL | Status: DC

## 2017-11-16 MED ORDER — CHLORHEXIDINE GLUCONATE 0.12 % MT SOLN
15.0000 mL | Freq: Two times a day (BID) | OROMUCOSAL | Status: DC
  Administered 2017-11-17 – 2017-11-19 (×5): 15 mL via OROMUCOSAL
  Filled 2017-11-16 (×5): qty 15

## 2017-11-16 MED ORDER — INSULIN ASPART 100 UNIT/ML IV SOLN
10.0000 [IU] | Freq: Once | INTRAVENOUS | Status: AC
  Administered 2017-11-16: 10 [IU] via INTRAVENOUS
  Filled 2017-11-16: qty 0.1

## 2017-11-16 MED ORDER — DEXTROSE 50 % IV SOLN
INTRAVENOUS | Status: AC
  Administered 2017-11-16: 01:00:00
  Filled 2017-11-16: qty 50

## 2017-11-16 MED ORDER — SODIUM CHLORIDE 0.9 % IV SOLN
1.0000 g | Freq: Once | INTRAVENOUS | Status: AC
  Administered 2017-11-16: 1 g via INTRAVENOUS
  Filled 2017-11-16: qty 10

## 2017-11-16 MED ORDER — SODIUM CHLORIDE 0.9 % IV SOLN
500.0000 mg | INTRAVENOUS | Status: DC
  Administered 2017-11-17 – 2017-11-18 (×2): 500 mg via INTRAVENOUS
  Filled 2017-11-16: qty 500
  Filled 2017-11-16 (×4): qty 0.5

## 2017-11-16 MED ORDER — ORAL CARE MOUTH RINSE: 15.0000 mL | Freq: Two times a day (BID) | OROMUCOSAL | Status: DC

## 2017-11-16 MED ORDER — CHLORHEXIDINE GLUCONATE CLOTH 2 % EX PADS
6.0000 | MEDICATED_PAD | Freq: Every day | CUTANEOUS | Status: DC
Start: 2017-11-16 — End: 2017-11-19
  Administered 2017-11-16 – 2017-11-18 (×2): 6 via TOPICAL

## 2017-11-16 NOTE — Progress Notes (Signed)
HD tx start    11/16/17 1803  Vital Signs  Pulse Rate 88  Pulse Rate Source Monitor  Resp 15  BP (!) 164/73  BP Location Left Arm  BP Method Automatic  Patient Position (if appropriate) Lying  Oxygen Therapy  SpO2 100 %  O2 Device Bi-PAP  During Hemodialysis Assessment  Blood Flow Rate (mL/min) 200 mL/min  Arterial Pressure (mmHg) -60 mmHg  Venous Pressure (mmHg) 50 mmHg  Transmembrane Pressure (mmHg) 30 mmHg  Ultrafiltration Rate (mL/min) 250 mL/min  Dialysate Flow Rate (mL/min) 300 ml/min  Conductivity: Machine  13.7  HD Safety Checks Performed Yes  Dialysis Fluid Bolus Normal Saline  Bolus Amount (mL) 250 mL  Intra-Hemodialysis Comments Tx initiated  Hemodialysis Catheter Right Internal jugular Temporary  Placement Date/Time: 11/16/17 1312   Placed prior to admission: No  Time Out: Correct patient;Correct site;Correct procedure  Maximum sterile barrier precautions: Hand hygiene;Cap;Sterile gown;Sterile gloves;Large sterile sheet  Site Prep: Chlorhexidi...  Blue Lumen Status Infusing  Red Lumen Status Infusing

## 2017-11-16 NOTE — Care Management Important Message (Signed)
Copy of signed IM left with patient and family in room. 

## 2017-11-16 NOTE — Progress Notes (Signed)
Pre HD assessment   11/16/17 1746  Vital Signs  Temp 98.2 F (36.8 C)  Temp Source Axillary  Pulse Rate 88  Pulse Rate Source Monitor  Resp 16  BP (!) 166/74  BP Location Left Arm  BP Method Automatic  Patient Position (if appropriate) Lying  Oxygen Therapy  SpO2 100 %  O2 Device Bi-PAP  Pain Assessment  Pain Scale 0-10  Pain Score 0  Pain Assessment/FLACC  Pain Rating: FLACC  - Face 0  Pain Rating: FLACC - Legs 0  Pain Rating: FLACC - Activity 0  Pain Rating: FLACC - Cry 0  Pain Rating: FLACC - Consolability 0  Score: FLACC  0  Dialysis Weight  Weight 114.6 kg (252 lb 10.4 oz)  Type of Weight Pre-Dialysis  Time-Out for Hemodialysis  What Procedure? HD  Pt Identifiers(min of two) First/Last Name;MRN/Account#  Correct Site? Yes  Correct Side? Yes  Correct Procedure? Yes  Consents Verified? Yes  Rad Studies Available? N/A  Safety Precautions Reviewed? Yes  Engineer, civil (consulting) Number  (3A)  Station Number  (bedside [id number])  UF/Alarm Test Passed  Conductivity: Meter 13.8  Conductivity: Machine  14  pH 7.4  Reverse Osmosis  (RO 5705)  Normal Saline Lot Number [id number]  Dialyzer Lot Number [id number]  Disposable Set Lot Number [id number]  Machine Temperature 98.6 F (37 C)  Musician and Audible Yes  Blood Lines Intact and Secured Yes  Pre Treatment Patient Checks  Vascular access used during treatment Catheter  Hepatitis B Surface Antigen Results  (unk , new start )  Hepatitis B Surface Antibody  (unk, new start)  Date Hepatitis B Surface Antibody Drawn 11/16/17  Hemodialysis Consent Verified Yes  Hemodialysis Standing Orders Initiated Yes  ECG (Telemetry) Monitor On Yes  Prime Ordered Normal Saline  Length of  DialysisTreatment -hour(s) 2 Hour(s)  Dialyzer Elisio 17H NR  Dialysate 2K, 2.5 Ca  Dialysis Anticoagulant None  Dialysate Flow Ordered 300  Blood Flow Rate Ordered 200 mL/min  Ultrafiltration Goal 0 Liters  Pre Treatment Labs Hepatitis B  Surface Antigen;Phosphorus (HBSAB, HB core. HC )  Dialysis Blood Pressure Support Ordered Normal Saline  Education / Care Plan  Dialysis Education Provided Yes  Documented Education in Care Plan Yes  Hemodialysis Catheter Right Internal jugular Temporary  Placement Date/Time: 11/16/17 1312   Placed prior to admission: No  Time Out: Correct patient;Correct site;Correct procedure  Maximum sterile barrier precautions: Hand hygiene;Cap;Sterile gown;Sterile gloves;Large sterile sheet  Site Prep: Chlorhexidi...  Site Condition No complications  Blue Lumen Status Heparin locked  Red Lumen Status Heparin locked  Dressing Type Biopatch  Dressing Status Clean;Dry;Intact  Drainage Description None

## 2017-11-16 NOTE — Consult Note (Signed)
CRITICAL CARE NOTE  CC  Acute resp failure  SUBJECTIVE 63 yo morbidly obese AAF admitted to ICU for severe resp failure with severe metabolic encephalopathy from severe and progressive renal failure etiology may be from sepsis/E coli bacteremia  She is placed on biPAP with increased WOB Lethargic and unresponsive  VAsc cath placed for emergent HD  She has severe metabolic acidosis, FWY>637        BP (!) 170/82   Pulse 96   Temp 98.2 F (36.8 C) (Oral)   Resp 16   Ht 5' 4"  (1.626 m)   Wt 252 lb 3.3 oz (114.4 kg)   LMP  (LMP Unknown) Comment: postmenopausal  SpO2 100%   BMI 43.29 kg/m    REVIEW OF SYSTEMS  PATIENT IS UNABLE TO PROVIDE COMPLETE REVIEW OF SYSTEM S DUE TO SEVERE CRITICAL ILLNESS AND ENCEPHALOPATHY   PHYSICAL EXAMINATION:  GENERAL:critically ill appearing, +resp distress HEAD: Normocephalic, atraumatic.  EYES: Pupils equal, round, reactive to light.  No scleral icterus.  MOUTH: Moist mucosal membrane. NECK: Supple. No thyromegaly. No nodules. No JVD.  PULMONARY: +rhonchi, +wheezing CARDIOVASCULAR: S1 and S2. Regular rate and rhythm. No murmurs, rubs, or gallops.  GASTROINTESTINAL: Soft, nontender, -distended. No masses. Positive bowel sounds. No hepatosplenomegaly.  MUSCULOSKELETAL: No swelling, clubbing, or edema.  NEUROLOGIC: obtunded SKIN:intact,warm,dry  ASSESSMENT AND PLAN Patient with severe resp failure from encephalopathy from severe metabolic acidosis from progressive renal failure with e coli bacteremia   Severe Hypoxic and Hypercapnic Respiratory Failure -biPAP as needed High risk for intubation   Renal Failure-most likely due to ATN/Sepsis -follow chem 7 -follow UO -continue Foley Catheter-assess need HD placed for emergent HD    NEUROLOGY encephalopathy from acidosis  Sepsis -use vasopressors to keep MAP>65 if needed -follow up cultures E coli bactermia  CARDIAC ICU monitoring  ID -continue IV abx as  prescibed   DVT/GI PRX ordered TRANSFUSIONS AS NEEDED MONITOR FSBS ASSESS the need for LABS as needed   Critical Care Time devoted to patient care services described in this note is 45 minutes.   Overall, patient is critically ill, prognosis is guarded.  Patient with Multiorgan failure and at high risk for cardiac arrest and death.     Corrin Parker, M.D.  [facility]  Medical Director [place] Director [facility] Cardio-Pulmonary Department

## 2017-11-16 NOTE — Progress Notes (Signed)
Pre HD assessment    11/16/17 1755  Neurological  Level of Consciousness Alert  Orientation Level Oriented to person  Respiratory  Respiratory Pattern  (pt on Bi-pap )  Chest Assessment Chest expansion symmetrical  Cardiac  ECG Monitor Yes  Vascular  R Radial Pulse +2  L Radial Pulse +2  Edema Generalized  Integumentary  Integumentary (WDL) X  Skin Color Appropriate for ethnicity  Musculoskeletal  Musculoskeletal (WDL) X  Generalized Weakness Yes  Assistive Device None  GU Assessment  Genitourinary (WDL) X  Genitourinary Symptoms  (HD)  Psychosocial  Psychosocial (WDL) WDL

## 2017-11-16 NOTE — Progress Notes (Signed)
Pharmacy Antibiotic Note  Deanna Gay is a 62 y.o. female admitted on 11/11/2017 with Urosepsis/Bacteremia.  Pharmacy has been consulted for meropenem dosing.   Plan: Will change meropenem dosing to 500 mg iv q 24 hours for acute renal failure requiring hemodialysis.   Will discuss possible conversion to cefazolin if patient can tolerate.   Height: 5' 4"  (162.6 cm) Weight: 252 lb 3.3 oz (114.4 kg) IBW/kg (Calculated) : 54.7  Temp (24hrs), Avg:97.8 F (36.6 C), Min:97.4 F (36.3 C), Max:98.2 F (36.8 C)  Recent Labs  Lab 11/11/17 1226 11/11/17 1554 11/11/17 2048 11/12/17 0355 11/12/17 0629 11/13/17 0703 11/14/17 0646 11/15/17 0417 11/15/17 1441 11/16/17 0613 11/16/17 1227  WBC 17.1*  --   --  15.9*  --  15.1*  --  12.1*  --   --   --   CREATININE 4.51*  --   --  4.87*  --  5.38* 5.65* 5.58* 5.72* 5.68* 5.48*  LATICACIDVEN 4.8* 4.9* 3.3*  --  1.4  --   --   --   --   --   --     Estimated Creatinine Clearance: 13.2 mL/min (A) (by C-G formula based on SCr of 5.48 mg/dL (H)).    Allergies  Allergen Reactions  . Iodine Swelling  . Penicillins Swelling    Has patient had a PCN reaction causing immediate rash, facial/tongue/throat swelling, SOB or lightheadedness with hypotension: Yes Has patient had a PCN reaction causing severe rash involving mucus membranes or skin necrosis: Yes Has patient had a PCN reaction that required hospitalization: No Has patient had a PCN reaction occurring within the last 10 years: No If all of the above answers are "NO", then may proceed with Cephalosporin use.     Antimicrobials this admission: 6/26 Aztreonam x 1 6/26 Levofloxacin x 1 6/27 Meropenem >>  Dose adjustments this admission: Meropenem 500 mg iv q 12 hours >> 500 mg iv q 24 hours  Microbiology results: UCx: sent 6/26 BCx: Ecoli 6/26 MRSA PCR: negative   Thank you for allowing pharmacy to be a part of this patient's care.  Ulice Dash D 11/16/2017 1:20 PM

## 2017-11-16 NOTE — Progress Notes (Signed)
Advanced Care Plan.  Purpose of Encounter: code status. Parties in Attendance: the patient, her sisters, Therapist, sports and me. Patient's Decisional Capacity: Yes. Medical Story: Deanna Gay  is a 62 y.o. female with a known history of diabetes mellitus, hypertension, arthritis, schizophrenia, COPD presents to the emergency room complaining of weakness, shortness of breath, nausea and foul-smelling urine.  Here patient has been found to be hypotensive with systolic blood pressure as low as 65. She is admitted for evere sepsis with septic shock due to UTI and E. coli bacteremia.; ARF, Hyponatremia, and COPD exacerbation. Her renal function is worsening, needs HD. She is drowsy and lethargic, has acute on chronic respiratory failure with hypercapnia and hyperkalemia.She has poor prognosis. I discussed with the patient about her condition, prognosis and code status. She wants FULL CODE.  Plan:  Code Status: FULL CODE. Time spent discussing advance care planning: 20 minutes.

## 2017-11-16 NOTE — Progress Notes (Signed)
Post HD assessment. PT tolerated tx well without c/o or complication. Net UF 22 ml, goal met.    11/16/17 2020  Hand-Off documentation  Report given to (Full Name) [facility]  Report received from (Full Name) Stark Bray  Vital Signs  Temp 98 F (36.7 C)  Temp Source Axillary  Pulse Rate 85  Pulse Rate Source Monitor  Resp 13  BP (!) 163/84  BP Location Left Arm  BP Method Automatic  Patient Position (if appropriate) Lying  Oxygen Therapy  SpO2 98 %  O2 Device Bi-PAP  FiO2 (%) 28 %  Dialysis Weight  Weight 113.9 kg (251 lb 1.7 oz)  Type of Weight Post-Dialysis  Post-Hemodialysis Assessment  Rinseback Volume (mL) 250 mL  KECN 24.4 V  Dialyzer Clearance Lightly streaked  Duration of HD Treatment -hour(s) 2 hour(s)  Hemodialysis Intake (mL) 500 mL  UF Total -Machine (mL) 522 mL  Net UF (mL) 22 mL  Tolerated HD Treatment Yes  Education / Care Plan  Dialysis Education Provided Yes  Documented Education in Care Plan Yes  Hemodialysis Catheter Right Internal jugular Temporary  Placement Date/Time: 11/16/17 1312   Placed prior to admission: No  Time Out: Correct patient;Correct site;Correct procedure  Maximum sterile barrier precautions: Hand hygiene;Cap;Sterile gown;Sterile gloves;Large sterile sheet  Site Prep: Chlorhexidi...  Site Condition No complications  Blue Lumen Status Heparin locked  Red Lumen Status Heparin locked  Catheter fill solution Heparin 1000 units/ml  Catheter fill volume (Arterial) 1.4 cc  Catheter fill volume (Venous) 1.4  Dressing Type Biopatch  Dressing Status Clean;Dry;Intact  Drainage Description None  Post treatment catheter status Capped and Clamped

## 2017-11-16 NOTE — Progress Notes (Signed)
[facility], [place] 11/16/17  Subjective:   Patient is lethargic and Somewhat confused this morning ABG results show acidosis with 7.24 pH and PCO2 of 57 Electrolytes show hyperkalemia potassium of 3.9, BUN is critically high at 100 Multiple family members including mother and cousin at bedside Patient reports that patient did eat a bagel this morning without nausea or vomiting Urine output recorded at 600 cc   Objective:  Vital signs in last 24 hours:  Temp:  [97.4 F (36.3 C)-98.4 F (36.9 C)] 97.4 F (36.3 C) (07/01 0329) Pulse Rate:  [98-99] 98 (07/01 0329) Resp:  [17-18] 18 (07/01 0329) BP: (134-141)/(64-69) 141/64 (07/01 0329) SpO2:  [88 %-99 %] 99 % (07/01 0329) FiO2 (%):  [28 %] 28 % (07/01 0225)  Weight change:  Filed Weights   11/11/17 1204 11/11/17 2014 11/13/17 0500  Weight: 92.5 kg (204 lb) 92 kg (202 lb 13.2 oz) 108.5 kg (239 lb 1.6 oz)    Intake/Output:    Intake/Output Summary (Last 24 hours) at 11/16/2017 1018 Last data filed at 11/16/2017 1008 Gross per 24 hour  Intake 2056.25 ml  Output 600 ml  Net 1456.25 ml     Physical Exam: General:  Laying in the bed, no acute distress  HEENT  anicteric, moist oral mucous membranes  Neck  supple, no masses  Pulm/lungs  normal breathing effort, clear  CVS/Heart  regular rhythm, no rub or gallop  Abdomen:   Soft, nontender  Extremities:  Trace to 1+ edema  Neurologic:  Lethargic but arousable, able to answer few questions, oriented to self and family  Skin:  No acute rashes  Access:  To be placed       Basic Metabolic Panel:  Recent Labs  Lab 11/12/17 0355 11/13/17 0703 11/14/17 5809  11/14/17 1621 11/14/17 2226 11/15/17 0417 11/15/17 1441 11/15/17 2254 11/16/17 0613  NA 129* 125* 122*   < > 128* 129* 130* 132*  --  134*  K 5.0 5.1 4.7  --   --   --  5.0 5.6* 6.0* 5.9*  CL 94* 93* 88*  --   --   --  94* 94*  --  101  CO2 18* 17* 19*  --   --   --  24 25  --  22   GLUCOSE 236* 181* 296*  --   --   --  103* 124*  --  107*  BUN 70* 85* 93*  --   --   --  98* 102*  --  100*  CREATININE 4.87* 5.38* 5.65*  --   --   --  5.58* 5.72*  --  5.68*  CALCIUM 5.9* 6.0* 5.8*  --   --   --  6.5* 6.7*  --  7.0*  MG 1.3* 2.8*  --   --   --   --   --   --   --   --   PHOS 8.9* 8.3* 7.5*  7.7*  --   --   --   --   --   --   --    < > = values in this interval not displayed.     CBC: Recent Labs  Lab 11/11/17 1226 11/12/17 0355 11/13/17 0703 11/15/17 0417  WBC 17.1* 15.9* 15.1* 12.1*  NEUTROABS 16.4*  --   --   --   HGB 11.9* 10.1* 10.6* 10.5*  HCT 37.3 31.6* 32.1* 31.4*  MCV 83.8 84.0 83.1 81.9  PLT 242  194 182 178     No results found for: HEPBSAG, HEPBSAB, HEPBIGM    Microbiology:  Recent Results (from the past 240 hour(s))  Blood Culture (routine x 2)     Status: Abnormal   Collection Time: 11/11/17 12:26 PM  Result Value Ref Range Status   Specimen Description   Final    BLOOD LEFT ANTECUBITAL Performed at [facility], [address] [place], [place] [address]    Special Requests   Final    BOTTLES DRAWN AEROBIC AND ANAEROBIC Blood Culture adequate volume Performed at [facility], [address] [place], [place] [address]    Culture  Setup Time   Final    IN BOTH AEROBIC AND ANAEROBIC BOTTLES GRAM NEGATIVE RODS CRITICAL RESULT CALLED TO, READ BACK BY AND VERIFIED WITH: C/ JASON ROBINS @0015  11/12/17 FLC Performed at [facility], [place] [address], [place], [place] [address]    Culture ESCHERICHIA COLI (A)  Final   Report Status 11/14/2017 FINAL  Final   Organism ID, Bacteria ESCHERICHIA COLI  Final      Susceptibility   Escherichia coli - MIC*    AMPICILLIN >=32 RESISTANT Resistant     CEFAZOLIN <=4 SENSITIVE Sensitive     CEFEPIME <=1 SENSITIVE Sensitive     CEFTAZIDIME <=1 SENSITIVE Sensitive     CEFTRIAXONE <=1 SENSITIVE Sensitive     CIPROFLOXACIN 1 SENSITIVE Sensitive     GENTAMICIN <=1 SENSITIVE  Sensitive     IMIPENEM <=0.25 SENSITIVE Sensitive     TRIMETH/SULFA <=20 SENSITIVE Sensitive     AMPICILLIN/SULBACTAM >=32 RESISTANT Resistant     PIP/TAZO <=4 SENSITIVE Sensitive     Extended ESBL NEGATIVE Sensitive     * ESCHERICHIA COLI  Blood Culture ID Panel (Reflexed)     Status: Abnormal   Collection Time: 11/11/17 12:26 PM  Result Value Ref Range Status   Enterococcus species NOT DETECTED NOT DETECTED Final   Listeria monocytogenes NOT DETECTED NOT DETECTED Final   Staphylococcus species NOT DETECTED NOT DETECTED Final   Staphylococcus aureus NOT DETECTED NOT DETECTED Final   Streptococcus species NOT DETECTED NOT DETECTED Final   Streptococcus agalactiae NOT DETECTED NOT DETECTED Final   Streptococcus pneumoniae NOT DETECTED NOT DETECTED Final   Streptococcus pyogenes NOT DETECTED NOT DETECTED Final   Acinetobacter baumannii NOT DETECTED NOT DETECTED Final   Enterobacteriaceae species DETECTED (A) NOT DETECTED Final    Comment: Enterobacteriaceae represent a large family of gram-negative bacteria, not a single organism. C/ JASON ROBINS @0015  11/12/17 FLC    Enterobacter cloacae complex NOT DETECTED NOT DETECTED Final   Escherichia coli DETECTED (A) NOT DETECTED Final    Comment: C/ JASON ROBINS @0015  11/12/17 FLC   Klebsiella oxytoca NOT DETECTED NOT DETECTED Final   Klebsiella pneumoniae NOT DETECTED NOT DETECTED Final   Proteus species NOT DETECTED NOT DETECTED Final   Serratia marcescens NOT DETECTED NOT DETECTED Final   Carbapenem resistance NOT DETECTED NOT DETECTED Final   Haemophilus influenzae NOT DETECTED NOT DETECTED Final   Neisseria meningitidis NOT DETECTED NOT DETECTED Final   Pseudomonas aeruginosa NOT DETECTED NOT DETECTED Final   Candida albicans NOT DETECTED NOT DETECTED Final   Candida glabrata NOT DETECTED NOT DETECTED Final   Candida krusei NOT DETECTED NOT DETECTED Final   Candida parapsilosis NOT DETECTED NOT DETECTED Final   Candida tropicalis NOT  DETECTED NOT DETECTED Final    Comment: Performed at [facility], [place], [place]  [address]  Blood Culture (routine x 2)     Status: Abnormal   Collection Time: 11/11/17 12:31 PM  Result Value Ref Range Status   Specimen Description   Final    BLOOD RIGHT ANTECUBITAL Performed at [facility], [address] [place], [place] [address]    Special Requests   Final    BOTTLES DRAWN AEROBIC AND ANAEROBIC Blood Culture results may not be optimal due to an excessive volume of blood received in culture bottles Performed at [facility], [address] [place], [place] [address]    Culture  Setup Time   Final    IN BOTH AEROBIC AND ANAEROBIC BOTTLES GRAM NEGATIVE RODS CRITICAL VALUE NOTED.  VALUE IS CONSISTENT WITH PREVIOUSLY REPORTED AND CALLED VALUE. Performed at [facility], [address] [place], [place] [address]    Culture (A)  Final    ESCHERICHIA COLI SUSCEPTIBILITIES PERFORMED ON PREVIOUS CULTURE WITHIN THE LAST 5 DAYS. Performed at [facility], [place] [address], [place], [place] [address]    Report Status 11/14/2017 FINAL  Final  MRSA PCR Screening     Status: None   Collection Time: 11/11/17  8:28 PM  Result Value Ref Range Status   MRSA by PCR NEGATIVE NEGATIVE Final    Comment:        The GeneXpert MRSA Assay (FDA approved for NASAL specimens only), is one component of a comprehensive MRSA colonization surveillance program. It is not intended to diagnose MRSA infection nor to guide or monitor treatment for MRSA infections. Performed at [facility], [address] [place], [place] [address]     Coagulation Studies: No results for input(s): LABPROT, INR in the last 72 hours.  Urinalysis: No results for input(s): COLORURINE, LABSPEC, PHURINE, GLUCOSEU, HGBUR, BILIRUBINUR, KETONESUR, PROTEINUR, UROBILINOGEN, NITRITE, LEUKOCYTESUR in the last 72 hours.  Invalid input(s): APPERANCEUR     Imaging: Dg Chest Port 1 View  Result Date: 11/15/2017 CLINICAL DATA:  Acute onset of hypoxia EXAM: PORTABLE CHEST 1 VIEW COMPARISON:  11/11/2017, 07/19/2017 FINDINGS: Rotated patient exaggerating the cardiomediastinal silhouette and resulting in widened appearance of mediastinum. Cardiomegaly. Small right-sided pleural effusion. Subsegmental atelectasis at the right base. No pneumothorax. IMPRESSION: 1. Patient rotation exaggerates the cardiomediastinal silhouette 2. Cardiomegaly with suspected small right pleural effusion. Subsegmental atelectasis at the right base. Electronically Signed   By: Donavan Foil M.D.   On: 11/15/2017 22:25     Medications:   . sodium chloride 75 mL/hr at 11/15/17 2152  . meropenem (MERREM) IV Stopped (11/16/17 0143)   . budesonide (PULMICORT) nebulizer solution  0.25 mg Nebulization BID  . calcium carbonate  800 mg of elemental calcium Oral BID  . gabapentin  100 mg Oral TID  . heparin injection (subcutaneous)  5,000 Units Subcutaneous Q8H  . insulin aspart  0-15 Units Subcutaneous TID WC  . insulin aspart  0-5 Units Subcutaneous QHS  . insulin glargine  10 Units Subcutaneous QHS  . ipratropium-albuterol  3 mL Nebulization Q6H  . nicotine  14 mg Transdermal Daily  . predniSONE  30 mg Oral Q breakfast  . risperiDONE  2 mg Oral Daily   acetaminophen **OR** acetaminophen, albuterol, diphenhydrAMINE, ondansetron **OR** ondansetron (ZOFRAN) IV, oxyCODONE-acetaminophen, phenol, polyethylene glycol, traMADol  Assessment/ Plan:  62 y.o.African [place] female with COPD, tobacco abuse, depression, GERD, diabetes mellitus type II,schizophrenia, hypertension, obstructive sleep apnea, anemiawho was admitted [facility]2019for acute renal failure  1.  Acute renal failure.  Baseline creatinine 0.8  in March 2019 2.  Hyperkalemia 3.  Urinary tract infection with sepsis and E. coli bacteremia  4.  diabetes type 2 with renal manifestations 5.  Respiratory  acidosis  Patient has progressive with worsening renal function and now has developed hyperkalemia.  She is also lethargic and confused.  No nausea or vomiting.  Patient was given Kayexalate yesterday and this morning along with insulin D50 and calcium.  She is currently in the process of being transferred to ICU because of respiratory acidosis and CO2 retention.  She will require continued BiPAP therapy.  Discussed case with primary team and family.  Because of the above concerns of confusion and persistent hyperkalemia, recommended hemodialysis to patient and family.  Risks and benefits of temporary hemodialysis were explained to the patient and family.  They have agreed to proceed.  We hope that she will need only a few treatments of hemodialysis.  Her urine output was 600 last 24 hours.  Hopefully it will continue to improve. Dr Lucky Cowboy has been consulted for temporary dialysis catheter placement.  Hemodialysis today after access is available.   LOS: [place] 7/1/201910:18 AM  [place], [place]  Note: This note was prepared with Dragon dictation. Any transcription errors are unintentional

## 2017-11-16 NOTE — Progress Notes (Signed)
[facility] NAME: Deanna Gay    MR#:  [id number]  DATE OF BIRTH:  11-Apr-1956  SUBJECTIVE:  CHIEF COMPLAINT:   Chief Complaint  Patient presents with  . Hypotension   The patient is drowsy and lethargic. ABG: PH: 7.24;  PCO2 57. REVIEW OF SYSTEMS:  Review of Systems  Constitutional: Positive for malaise/fatigue. Negative for chills and fever.  HENT: Negative for sore throat.   Eyes: Negative for blurred vision and double vision.  Respiratory: Negative for cough, hemoptysis, shortness of breath, wheezing and stridor.   Cardiovascular: Negative for chest pain, palpitations, orthopnea and leg swelling.  Gastrointestinal: Negative for abdominal pain, blood in stool, diarrhea, melena, nausea and vomiting.  Genitourinary: Negative for dysuria, flank pain, frequency, hematuria and urgency.  Musculoskeletal: Negative for back pain and joint pain.  Skin: Negative for itching and rash.  Neurological: Negative for dizziness, sensory change, focal weakness, seizures, loss of consciousness, weakness and headaches.  Endo/Heme/Allergies: Negative for polydipsia.  Psychiatric/Behavioral: Negative for depression. The patient is not nervous/anxious.     DRUG ALLERGIES:   Allergies  Allergen Reactions  . Iodine Swelling  . Penicillins Swelling    Has patient had a PCN reaction causing immediate rash, facial/tongue/throat swelling, SOB or lightheadedness with hypotension: Yes Has patient had a PCN reaction causing severe rash involving mucus membranes or skin necrosis: Yes Has patient had a PCN reaction that required hospitalization: No Has patient had a PCN reaction occurring within the last 10 years: No If all of the above answers are "NO", then may proceed with Cephalosporin use.    VITALS:  Blood pressure (!) 170/82, pulse 96, temperature 98.2 F (36.8 C), temperature source Oral, resp. rate 16, height 5' 4"  (1.626 m), weight  252 lb 3.3 oz (114.4 kg), SpO2 100 %. PHYSICAL EXAMINATION:  Physical Exam  Constitutional: She is oriented to person, place, and time. She appears well-developed.  Morbid Obesity.  HENT:  Head: Normocephalic.  Mouth/Throat: Oropharynx is clear and moist.  Eyes: Pupils are equal, round, and reactive to light. Conjunctivae and EOM are normal. No scleral icterus.  Neck: Normal range of motion. Neck supple. No JVD present. No tracheal deviation present.  Cardiovascular: Normal rate, regular rhythm and normal heart sounds. Exam reveals no gallop.  No murmur heard. Pulmonary/Chest: Effort normal and breath sounds normal. No respiratory distress. She has no wheezes. She has no rales.  Abdominal: Soft. Bowel sounds are normal. She exhibits no distension. There is no tenderness. There is no rebound.  Musculoskeletal: Normal range of motion. She exhibits no edema or tenderness.  Neurological: She is alert and oriented to person, place, and time. No cranial nerve deficit.  Skin: No rash noted. No erythema.  Psychiatric:  Lethargic and drowsy.   LABORATORY PANEL:  Female CBC Recent Labs  Lab 11/15/17 0417  WBC 12.1*  HGB 10.5*  HCT 31.4*  PLT 178   ------------------------------------------------------------------------------------------------------------------ Chemistries  Recent Labs  Lab 11/13/17 0703  11/15/17 0417  11/16/17 0613  NA 125*   < > 130*   < > 134*  K 5.1   < > 5.0   < > 5.9*  CL 93*   < > 94*   < > 101  CO2 17*   < > 24   < > 22  GLUCOSE 181*   < > 103*   < > 107*  BUN 85*   < > 98*   < > 100*  CREATININE 5.38*   < > 5.58*   < > 5.68*  CALCIUM 6.0*   < > 6.5*   < > 7.0*  MG 2.8*  --   --   --   --   AST  --   --  12*  --   --   ALT  --   --  13  --   --   ALKPHOS  --   --  71  --   --   BILITOT  --   --  0.3  --   --    < > = values in this interval not displayed.   RADIOLOGY:  Dg Chest Port 1 View  Result Date: 11/15/2017 CLINICAL DATA:  Acute onset of  hypoxia EXAM: PORTABLE CHEST 1 VIEW COMPARISON:  11/11/2017, 07/19/2017 FINDINGS: Rotated patient exaggerating the cardiomediastinal silhouette and resulting in widened appearance of mediastinum. Cardiomegaly. Small right-sided pleural effusion. Subsegmental atelectasis at the right base. No pneumothorax. IMPRESSION: 1. Patient rotation exaggerates the cardiomediastinal silhouette 2. Cardiomegaly with suspected small right pleural effusion. Subsegmental atelectasis at the right base. Electronically Signed   By: Donavan Foil M.D.   On: 11/15/2017 22:25   ASSESSMENT AND PLAN:   *Severe sepsis with septic shock due to UTI and E. coli bacteremia.   The patient got fluid resuscitation as per sepsis protocol.  Hypotension improved. Started on IV aztreonam in the emergency room.   Continue meropenem.  Urine culture and blood culture show E. coli.  Follow-up CBC. sensitivity: Sensitive to many antibiotics, but has allergy to penicillin.  *Acute kidney injury.  Likely due to severe sepsis and hypotension.    Worsening renal function. Renal ultrasound did not show any obstruction.  Change from hypertonic saline to normal saline per Dr. Juleen China, follow-up BMP.  Hold Vasotec and HCTZ. Need HD per Dr. Candiss Norse.  Hyponatremia.  Improving with hypertonic saline, change from hypertonic saline to normal saline per Dr. Juleen China.  Hypomagnesemia.  Improved with magnesium supplement.  *Diabetes mellitus.  Sliding scale insulin.  Hold metformin.  Lantus 10 units at bedtime with NovoLog 5 units 3 times daily.  *Chronic respiratory failure with COPD exacerbation.  Improved. Changed to prednisone p.o. and continue nebulizers.  Acute on chronic respiratory failure with hypercapnia. BIPAP and intensivist consult. Transfer to stepdown unit.  Hyperkalemia. K6.0 and 5.9 this am. Given calcium iv, D50, insulin iv x2 this am.  Follow up K this pm.  Tobacco abuse.  Smoking cessation was counseled for 3 minutes.  Morbid  obesity and OSA.  CPAP at night.  Discussed with Dr. Mortimer Fries and Dr. Candiss Norse. All the records are reviewed and case discussed with Care Management/Social Worker. Management plans discussed with the patient, daughter-in-law and they are in agreement.  CODE STATUS: Full Code  TOTAL TIME TAKING CARE OF THIS PATIENT: 38 minutes.   More than 50% of the time was spent in counseling/coordination of care: YES  POSSIBLE D/C IN ? DAYS, DEPENDING ON CLINICAL CONDITION.   Demetrios Loll M.D on 11/16/2017 at 1:05 PM  Between 7am to 6pm - Pager - [phone]  After 6pm go to [email or web address] - [facility]

## 2017-11-16 NOTE — Procedures (Signed)
Central Venous Dailysis Catheter Placement: TRIPLE  LUMEN  TRIALYSIS Indication: Hemo Dialysis/CRRT   Consent:emergent   Hand washing performed prior to starting the procedure.   Procedure: An active timeout was performed and correct patient, name, & ID confirmed. Patient was positioned correctly for central venous access. Patient was prepped using strict sterile technique including chlorohexadine preps, sterile drape, sterile gown and sterile gloves.  The area was prepped, draped and anesthetized in the usual sterile manner. Patient comfort was obtained.    A Double lumen catheter was placed in RT IJ  Vein There was good blood return, catheter caps were placed on lumens, catheter flushed easily, the line was secured and a sterile dressing and BIO-PATCH applied.   Ultrasound was used to visualize vasculature and guidance of needle.   Number of Attempts: 1 Complications:none  Estimated Blood Loss: none Operator: Lundyn Coste.   Corrin Parker, M.D.  [facility]  Medical Director [place] Director [facility] Cardio-Pulmonary Department

## 2017-11-16 NOTE — Progress Notes (Signed)
Post HD assessment    11/16/17 2019  Neurological  Level of Consciousness Alert  Orientation Level Oriented to person  Respiratory  Respiratory Pattern  (pt on Bi-pap)  Chest Assessment Chest expansion symmetrical  Cardiac  ECG Monitor Yes  Vascular  R Radial Pulse +2  L Radial Pulse +2  Edema Generalized  Integumentary  Integumentary (WDL) X  Skin Color Appropriate for ethnicity  Musculoskeletal  Musculoskeletal (WDL) X  Generalized Weakness Yes  Assistive Device None  GU Assessment  Genitourinary (WDL) X  Genitourinary Symptoms  (HD)  Psychosocial  Psychosocial (WDL) WDL

## 2017-11-16 NOTE — Progress Notes (Signed)
HD tx end    11/16/17 2006  Vital Signs  Pulse Rate 84  Pulse Rate Source Monitor  Resp 13  BP (!) 169/81  BP Location Left Arm  BP Method Automatic  Patient Position (if appropriate) Lying  Oxygen Therapy  SpO2 100 %  O2 Device Bi-PAP  FiO2 (%) 28 %  During Hemodialysis Assessment  Dialysis Fluid Bolus Normal Saline  Bolus Amount (mL) 250 mL  Intra-Hemodialysis Comments Tx completed

## 2017-11-17 LAB — BASIC METABOLIC PANEL
Anion gap: 10 (ref 5–15)
BUN: 72 mg/dL — AB (ref 8–23)
CALCIUM: 7.4 mg/dL — AB (ref 8.9–10.3)
CO2: 30 mmol/L (ref 22–32)
Chloride: 101 mmol/L (ref 98–111)
Creatinine, Ser: 3.97 mg/dL — ABNORMAL HIGH (ref 0.44–1.00)
GFR calc Af Amer: 13 mL/min — ABNORMAL LOW (ref >60)
GFR, EST NON AFRICAN AMERICAN: 11 mL/min — AB (ref >60)
Glucose, Bld: 94 mg/dL (ref 70–99)
POTASSIUM: 4.8 mmol/L (ref 3.5–5.1)
SODIUM: 141 mmol/L (ref 135–145)

## 2017-11-17 LAB — MAGNESIUM: MAGNESIUM: 2.4 mg/dL (ref 1.7–2.4)

## 2017-11-17 LAB — GLUCOSE, CAPILLARY
GLUCOSE-CAPILLARY: 88 mg/dL (ref 70–99)
GLUCOSE-CAPILLARY: 184 mg/dL — AB (ref 70–99)
Glucose-Capillary: 79 mg/dL (ref 70–99)
Glucose-Capillary: 113 mg/dL — ABNORMAL HIGH (ref 70–99)
Glucose-Capillary: 232 mg/dL — ABNORMAL HIGH (ref 70–99)

## 2017-11-17 LAB — PHOSPHORUS: Phosphorus: 5.9 mg/dL — ABNORMAL HIGH (ref 2.5–4.6)

## 2017-11-17 NOTE — Progress Notes (Signed)
Pt remains off bipap. Transition to 2L [place] O2 sat 99% no signs of distress. Pt A&O x 3. VSS. Pt denies pain.  Pt has refused several medications and mouth care (see MAR for details). This RN educated pt on risk of refusing medications. Pt stated " I don't care, I don't take these medicines and I'm not going to take these medicines!"

## 2017-11-17 NOTE — Progress Notes (Signed)
   11/17/17 1044  Clinical Encounter Type  Visited With Patient and family together  Visit Type Initial   Chaplain made introductory visit and patient asked if she 'was dying.'  Chaplain provided education on chaplain role and assured patient that chaplain just wanted to see if the patient would like a visit.  Patient reported having a headache and family member in the room stated she was on her way out so patient could rest.  Chaplain spoke of 24/7chaplain availability and encouraged patient/family to page as needed.

## 2017-11-17 NOTE — Progress Notes (Signed)
[facility] [place] NAME: Deanna Gay    MR#:  [id number]  DATE OF BIRTH:  [birth year]  SUBJECTIVE:  CHIEF COMPLAINT:   Chief Complaint  Patient presents with  . Hypotension   The patient is drowsy and lethargic. Off BiPAP, on oxygen by nasal cannula 2 L REVIEW OF SYSTEMS:  Review of Systems  Constitutional: Positive for malaise/fatigue. Negative for chills and fever.  HENT: Negative for sore throat.   Eyes: Negative for blurred vision and double vision.  Respiratory: Negative for cough, hemoptysis, shortness of breath, wheezing and stridor.   Cardiovascular: Negative for chest pain, palpitations, orthopnea and leg swelling.  Gastrointestinal: Negative for abdominal pain, blood in stool, diarrhea, melena, nausea and vomiting.  Genitourinary: Negative for dysuria, flank pain, frequency, hematuria and urgency.  Musculoskeletal: Negative for back pain and joint pain.  Skin: Negative for itching and rash.  Neurological: Negative for dizziness, sensory change, focal weakness, seizures, loss of consciousness, weakness and headaches.  Endo/Heme/Allergies: Negative for polydipsia.  Psychiatric/Behavioral: Negative for depression. The patient is not nervous/anxious.     DRUG ALLERGIES:   Allergies  Allergen Reactions  . Iodine Swelling  . Penicillins Swelling    Has patient had a PCN reaction causing immediate rash, facial/tongue/throat swelling, SOB or lightheadedness with hypotension: Yes Has patient had a PCN reaction causing severe rash involving mucus membranes or skin necrosis: Yes Has patient had a PCN reaction that required hospitalization: No Has patient had a PCN reaction occurring within the last 10 years: No If all of the above answers are "NO", then may proceed with Cephalosporin use.    VITALS:  Blood pressure (!) 167/73, pulse 98, temperature 98 F (36.7 C), temperature source Oral, resp. rate 18, height 5' 4"   (1.626 m), weight 242 lb 15.2 oz (110.2 kg), SpO2 98 %. PHYSICAL EXAMINATION:  Physical Exam  Constitutional: She is oriented to person, place, and time. She appears well-developed.  Morbid Obesity.  HENT:  Head: Normocephalic.  Mouth/Throat: Oropharynx is clear and moist.  Eyes: Pupils are equal, round, and reactive to light. Conjunctivae and EOM are normal. No scleral icterus.  Neck: Normal range of motion. Neck supple. No JVD present. No tracheal deviation present.  Cardiovascular: Normal rate, regular rhythm and normal heart sounds. Exam reveals no gallop.  No murmur heard. Pulmonary/Chest: Effort normal and breath sounds normal. No respiratory distress. She has no wheezes. She has no rales.  Abdominal: Soft. Bowel sounds are normal. She exhibits no distension. There is no tenderness. There is no rebound.  Musculoskeletal: Normal range of motion. She exhibits no edema or tenderness.  Neurological: She is alert and oriented to person, place, and time. No cranial nerve deficit.  Skin: No rash noted. No erythema.  Psychiatric:  Lethargic and drowsy.   LABORATORY PANEL:  Female CBC Recent Labs  Lab 11/15/17 0417  WBC 12.1*  HGB 10.5*  HCT 31.4*  PLT 178   ------------------------------------------------------------------------------------------------------------------ Chemistries  Recent Labs  Lab 11/15/17 0417  11/17/17 0612  NA 130*   < > 141  K 5.0   < > 4.8  CL 94*   < > 101  CO2 24   < > 30  GLUCOSE 103*   < > 94  BUN 98*   < > 72*  CREATININE 5.58*   < > 3.97*  CALCIUM 6.5*   < > 7.4*  MG  --   --  2.4  AST 12*  --   --  ALT 13  --   --   ALKPHOS 71  --   --   BILITOT 0.3  --   --    < > = values in this interval not displayed.   RADIOLOGY:  No results found. ASSESSMENT AND PLAN:   *Severe sepsis with septic shock due to UTI and E. coli bacteremia.   The patient got fluid resuscitation as per sepsis protocol.  Hypotension improved. Started on IV  aztreonam in the emergency room.   Continue meropenem.  Urine culture and blood culture show E. coli.  Follow-up CBC. sensitivity: Sensitive to many antibiotics, but has allergy to penicillin.  *Acute kidney injury.  Likely secondary to ATN due to severe sepsis and hypotension.     Renal ultrasound did not show any obstruction.  Hold Vasotec and HCTZ. Since the patient's renal function is improving and she has good urine output after hemodialysis yesterday, no need for further hemodialysis per Dr. Candiss Norse.  Hyponatremia.  Improving with hypertonic saline, change from hypertonic saline to normal saline per Dr. Juleen China.  Hypomagnesemia.  Improved with magnesium supplement.  *Diabetes mellitus.  Sliding scale insulin.  Hold metformin.  Lantus 10 units at bedtime.  NovoLog 5 units 3 times daily was discontinued.  *Chronic respiratory failure with COPD exacerbation.   Changed to prednisone p.o. and continue nebulizers.   Acute on chronic respiratory failure with hypercapnia. Improved with BIPAP, off BIPAP, on O2 [place] 2 L. NEB prn.  Hyperkalemia.  Improved with hemodialysis. Tobacco abuse.  Smoking cessation was counseled for 3 minutes.  Morbid obesity and OSA.  CPAP at night.  All the records are reviewed and case discussed with Care Management/Social Worker. Management plans discussed with the patient, daughter-in-law and they are in agreement.  CODE STATUS: Full Code  TOTAL TIME TAKING CARE OF THIS PATIENT: 36 minutes.   More than 50% of the time was spent in counseling/coordination of care: YES  POSSIBLE D/C IN 2-3 DAYS, DEPENDING ON CLINICAL CONDITION.   Demetrios Loll M.D on 11/17/2017 at 4:16 PM  Between 7am to 6pm - Pager - [phone]  After 6pm go to [email or web address] - [facility]

## 2017-11-17 NOTE — Care Management (Addendum)
LTAC screen requested from both Santiago Glad at [facility] and [place] with [facility]. Update 11/18/17 1413: patient transferred back to med-surg.

## 2017-11-17 NOTE — Progress Notes (Signed)
Follow up - Critical Care Medicine Note  Patient Details:    Deanna Gay is an 62 y.o. female.with a past medical history of diabetes mellitus, hypertension, arthritis, schizophrenia, COPD presents to the emergency room complaining of weakness, shortness of breath, nausea and foul-smelling urine, encephalopathy, respiratory failure requiring BiPAP, Found to be uroseptic with renal failure requiring hemodialysis    Anti-infectives:  Anti-infectives (From admission, onward)   Start     Dose/Rate Route Frequency Ordered Stop   11/17/17 0100  meropenem (MERREM) 500 mg in sodium chloride 0.9 % 100 mL IVPB     500 mg 200 mL/hr over 30 Minutes Intravenous Every 24 hours 11/16/17 1317     11/13/17 0000  levofloxacin (LEVAQUIN) IVPB 500 mg  Status:  Discontinued     500 mg 100 mL/hr over 60 Minutes Intravenous Every 48 hours 11/11/17 2000 11/12/17 0031   11/12/17 0100  meropenem (MERREM) 500 mg in sodium chloride 0.9 % 100 mL IVPB  Status:  Discontinued     500 mg 200 mL/hr over 30 Minutes Intravenous Every 12 hours 11/12/17 0032 11/16/17 1317   11/11/17 1230  levofloxacin (LEVAQUIN) IVPB 750 mg  Status:  Discontinued     750 mg 100 mL/hr over 90 Minutes Intravenous  Once 11/11/17 1226 11/11/17 1346   11/11/17 1230  aztreonam (AZACTAM) 2 g in sodium chloride 0.9 % 100 mL IVPB     2 g 200 mL/hr over 30 Minutes Intravenous  Once 11/11/17 1226 11/11/17 1403      Microbiology: Results for orders placed or performed during the hospital encounter of 11/11/17  Blood Culture (routine x 2)     Status: Abnormal   Collection Time: 11/11/17 12:26 PM  Result Value Ref Range Status   Specimen Description   Final    BLOOD LEFT ANTECUBITAL Performed at [facility], [address], [place], [place] [address]    Special Requests   Final    BOTTLES DRAWN AEROBIC AND ANAEROBIC Blood Culture adequate volume Performed at [facility], [address] [place], [place] [address]     Culture  Setup Time   Final    IN BOTH AEROBIC AND ANAEROBIC BOTTLES GRAM NEGATIVE RODS CRITICAL RESULT CALLED TO, READ BACK BY AND VERIFIED WITH: C/ JASON ROBINS @0015  11/12/17 FLC Performed at [facility], [place] [address], [place], [place] [address]    Culture ESCHERICHIA COLI (A)  Final   Report Status 11/14/2017 FINAL  Final   Organism ID, Bacteria ESCHERICHIA COLI  Final      Susceptibility   Escherichia coli - MIC*    AMPICILLIN >=32 RESISTANT Resistant     CEFAZOLIN <=4 SENSITIVE Sensitive     CEFEPIME <=1 SENSITIVE Sensitive     CEFTAZIDIME <=1 SENSITIVE Sensitive     CEFTRIAXONE <=1 SENSITIVE Sensitive     CIPROFLOXACIN 1 SENSITIVE Sensitive     GENTAMICIN <=1 SENSITIVE Sensitive     IMIPENEM <=0.25 SENSITIVE Sensitive     TRIMETH/SULFA <=20 SENSITIVE Sensitive     AMPICILLIN/SULBACTAM >=32 RESISTANT Resistant     PIP/TAZO <=4 SENSITIVE Sensitive     Extended ESBL NEGATIVE Sensitive     * ESCHERICHIA COLI  Blood Culture ID Panel (Reflexed)     Status: Abnormal   Collection Time: 11/11/17 12:26 PM  Result Value Ref Range Status   Enterococcus species NOT DETECTED NOT DETECTED Final   Listeria monocytogenes NOT DETECTED NOT DETECTED Final   Staphylococcus species NOT DETECTED NOT  DETECTED Final   Staphylococcus aureus NOT DETECTED NOT DETECTED Final   Streptococcus species NOT DETECTED NOT DETECTED Final   Streptococcus agalactiae NOT DETECTED NOT DETECTED Final   Streptococcus pneumoniae NOT DETECTED NOT DETECTED Final   Streptococcus pyogenes NOT DETECTED NOT DETECTED Final   Acinetobacter baumannii NOT DETECTED NOT DETECTED Final   Enterobacteriaceae species DETECTED (A) NOT DETECTED Final    Comment: Enterobacteriaceae represent a large family of gram-negative bacteria, not a single organism. C/ JASON ROBINS @0015  11/12/17 FLC    Enterobacter cloacae complex NOT DETECTED NOT DETECTED Final   Escherichia coli DETECTED (A) NOT DETECTED Final    Comment: C/  JASON ROBINS @0015  11/12/17 [facility]   Klebsiella oxytoca NOT DETECTED NOT DETECTED Final   Klebsiella pneumoniae NOT DETECTED NOT DETECTED Final   Proteus species NOT DETECTED NOT DETECTED Final   Serratia marcescens NOT DETECTED NOT DETECTED Final   Carbapenem resistance NOT DETECTED NOT DETECTED Final   Haemophilus influenzae NOT DETECTED NOT DETECTED Final   Neisseria meningitidis NOT DETECTED NOT DETECTED Final   Pseudomonas aeruginosa NOT DETECTED NOT DETECTED Final   Candida albicans NOT DETECTED NOT DETECTED Final   Candida glabrata NOT DETECTED NOT DETECTED Final   Candida krusei NOT DETECTED NOT DETECTED Final   Candida parapsilosis NOT DETECTED NOT DETECTED Final   Candida tropicalis NOT DETECTED NOT DETECTED Final    Comment: Performed at [facility], [address] [place], [place] [address]  Blood Culture (routine x 2)     Status: Abnormal   Collection Time: 11/11/17 12:31 PM  Result Value Ref Range Status   Specimen Description   Final    BLOOD RIGHT ANTECUBITAL Performed at [facility], [address] [place], [place] [address]    Special Requests   Final    BOTTLES DRAWN AEROBIC AND ANAEROBIC Blood Culture results may not be optimal due to an excessive volume of blood received in culture bottles Performed at [facility], [address] [place], [place] [address]    Culture  Setup Time   Final    IN BOTH AEROBIC AND ANAEROBIC BOTTLES GRAM NEGATIVE RODS CRITICAL VALUE NOTED.  VALUE IS CONSISTENT WITH PREVIOUSLY REPORTED AND CALLED VALUE. Performed at [facility], [address] [place], [place] [address]    Culture (A)  Final    ESCHERICHIA COLI SUSCEPTIBILITIES PERFORMED ON PREVIOUS CULTURE WITHIN THE LAST 5 DAYS. Performed at [facility], [place] [address] [place], [place] [address]    Report Status 11/14/2017 FINAL  Final  MRSA PCR Screening     Status: None   Collection Time: 11/11/17  8:28 PM  Result Value Ref  Range Status   MRSA by PCR NEGATIVE NEGATIVE Final    Comment:        The GeneXpert MRSA Assay (FDA approved for NASAL specimens only), is one component of a comprehensive MRSA colonization surveillance program. It is not intended to diagnose MRSA infection nor to guide or monitor treatment for MRSA infections. Performed at [facility], [address] [place], [place] [address]     Best Practice/Protocols:  VTE Prophylaxis: Heparin (SQ)   Studies: US Renal  Result Date: 11/11/2017 CLINICAL DATA:  Acute kidney injury. EXAM: RENAL / URINARY TRACT ULTRASOUND COMPLETE COMPARISON:  CT abdomen pelvis-11/28/2016 FINDINGS: Right Kidney: Normal cortical thickness, echogenicity and size, measuring 13.8 cm in length. Note is made of a mild fetal lobulation of the right renal cortex. No focal renal lesions.  No echogenic renal stones. No urinary obstruction. Left Kidney: Evaluation of the left kidney is minimally degraded secondary to overlying bowel gas and poor sonographic window. Normal cortical thickness, echogenicity and size, measuring 12.6 cm in length. No focal renal lesions. No echogenic renal stones. No urinary obstruction. Bladder: Decompressed. IMPRESSION: No explanation for patient's acute renal insufficiency. Specifically, no evidence of urinary obstruction. Electronically Signed   By: Sandi Mariscal M.D.   On: 11/11/2017 17:47   Dg Chest Port 1 View  Result Date: 11/16/2017 CLINICAL DATA:  Status post dialysis catheter placement. EXAM: PORTABLE CHEST 1 VIEW COMPARISON:  Portable chest x-ray of November 15, 2017 FINDINGS: The dialysis catheter tip projects just proximal to the cavoatrial junction. There is no postprocedure pneumothorax or hemothorax. The cardiac silhouette is enlarged. The pulmonary vascularity is mildly prominent centrally. There is stable subsegmental atelectasis in the right mid and lower lung. There is no pleural effusion. There is tortuosity of the ascending and  descending thoracic aorta. IMPRESSION: The hemodialysis catheter tip projects just proximal to the right cavoatrial junction. Mild mid and lower right lung subsegmental atelectasis. Stable cardiomegaly without pulmonary edema. Thoracic aortic atherosclerosis. Electronically Signed   By: David  Martinique M.D.   On: 11/16/2017 13:38   Dg Chest Port 1 View  Result Date: 11/15/2017 CLINICAL DATA:  Acute onset of hypoxia EXAM: PORTABLE CHEST 1 VIEW COMPARISON:  11/11/2017, 07/19/2017 FINDINGS: Rotated patient exaggerating the cardiomediastinal silhouette and resulting in widened appearance of mediastinum. Cardiomegaly. Small right-sided pleural effusion. Subsegmental atelectasis at the right base. No pneumothorax. IMPRESSION: 1. Patient rotation exaggerates the cardiomediastinal silhouette 2. Cardiomegaly with suspected small right pleural effusion. Subsegmental atelectasis at the right base. Electronically Signed   By: Donavan Foil M.D.   On: 11/15/2017 22:25   Dg Chest Portable 1 View  Result Date: 11/11/2017 CLINICAL DATA:  Shortness of breath. EXAM: PORTABLE CHEST 1 VIEW COMPARISON:  Radiograph of same day. FINDINGS: The heart size and mediastinal contours are within normal limits. Both lungs are clear. No pneumothorax or pleural effusion is noted. The visualized skeletal structures are unremarkable. IMPRESSION: No acute cardiopulmonary abnormality seen. Electronically Signed   By: Marijo Conception, M.D.   On: 11/11/2017 14:58   Dg Chest Port 1 View  Result Date: 11/11/2017 CLINICAL DATA:  Dyspnea. EXAM: PORTABLE CHEST 1 VIEW COMPARISON:  Radiographs of July 19, 2017 FINDINGS: Stable cardiomediastinal silhouette. Both lungs are clear. No pneumothorax or pleural effusion is noted. The visualized skeletal structures are unremarkable. IMPRESSION: No acute cardiopulmonary abnormality seen. Electronically Signed   By: Marijo Conception, M.D.   On: 11/11/2017 13:14    Consults: Treatment Team:  Lavonia Dana,  MD Algernon Huxley, MD   Subjective:    Overnight Issues: over the last 24 hours vascular catheter was placed and patient underwent hemodialysis. Presently on BiPAP states that her breathing is fine is still somewhat confused but awake and alert  Objective:  Vital signs for last 24 hours: Temp:  [97.8 F (36.6 C)-99.2 F (37.3 C)] 99.2 F (37.3 C) (07/02 0400) Pulse Rate:  [72-96] 82 (07/02 0600) Resp:  [12-19] 12 (07/02 0600) BP: (125-180)/(62-112) 140/112 (07/02 0600) SpO2:  [89 %-100 %] 89 % (07/02 0600) FiO2 (%):  [28 %] 28 % (07/01 2100) Weight:  [242 lb 15.2 oz (110.2 kg)-252 lb 10.4 oz (114.6 kg)] 242 lb 15.2 oz (110.2 kg) (07/02 0500)  Hemodynamic parameters for last 24 hours:    Intake/Output from previous day: 07/01 0701 -  07/02 0700 In: 120 [P.O.:120] Out: 1522 [Urine:1500]  Intake/Output this shift: No intake/output data recorded.  Vent settings for last 24 hours: FiO2 (%):  [28 %] 28 %  Physical Exam: GENERAL:patient is resting comfortably on BiPAP HEAD: Normocephalic, atraumatic.  EYES: Pupils equal, round, reactive to light.  No scleral icterus.  MOUTH: Moist mucosal membrane. NECK: Supple. No thyromegaly. No nodules. No JVD.  PULMONARY: improve respiration, no wheezing or rhonchi appreciated, scant crackles in the lung bases CARDIOVASCULAR: S1 and S2. Regular rate and rhythm. No murmurs, rubs, or gallops.  GASTROINTESTINAL: Soft, nontender, -distended. No masses. Positive bowel sounds. No hepatosplenomegaly.  MUSCULOSKELETAL: No swelling, clubbing, or edema.  NEUROLOGIC: obtunded SKIN:intact,warm,dry    Assessment/Plan:   Respiratory failure. Significantly improved, on BiPAP, will wean to nasal cannula. Is on prednisone, albuterol, Atrovent and antibiotics  Acute renal failure. Most likely secondary to ATN/sepsis. Status post HD catheter and emergent hemodialysis per nephrology  Urosepsis. Patient with Escherichia coli bacteremia, presently on  meropenem and sensitive   Critical Care Total Time. 35 minutes  Deanna Gay 11/17/2017  *Care during the described time interval was provided by me and/or other providers on the critical care team.  I have reviewed this patient's available data, including medical history, events of note, physical examination and test results as part of my evaluation.

## 2017-11-17 NOTE — Progress Notes (Signed)
[facility], [place] 11/17/17  Subjective:   Patient is doing better today Potassium is corrected to 4.8 after dialysis treatment.  She tolerated it well Urine output has improved to 1500 cc Patient mental status is much more clear Earlier today when seen, she was hungry and asked for food   Objective:  Vital signs in last 24 hours:  Temp:  [97.8 F (36.6 C)-99.2 F (37.3 C)] 98 F (36.7 C) (07/02 0815) Pulse Rate:  [72-96] 96 (07/02 0945) Resp:  [12-19] 16 (07/02 0945) BP: (125-180)/(62-112) 149/64 (07/02 0900) SpO2:  [89 %-100 %] 100 % (07/02 0945) FiO2 (%):  [28 %] 28 % (07/02 0815) Weight:  [110.2 kg (242 lb 15.2 oz)-114.6 kg (252 lb 10.4 oz)] 110.2 kg (242 lb 15.2 oz) (07/02 0500)  Weight change:  Filed Weights   11/16/17 1746 11/16/17 2020 11/17/17 0500  Weight: 114.6 kg (252 lb 10.4 oz) 113.9 kg (251 lb 1.7 oz) 110.2 kg (242 lb 15.2 oz)    Intake/Output:    Intake/Output Summary (Last 24 hours) at 11/17/2017 1201 Last data filed at 11/17/2017 0815 Gross per 24 hour  Intake 0 ml  Output 1322 ml  Net -1322 ml     Physical Exam: General:  Laying in the bed, no acute distress  HEENT  anicteric, moist oral mucous membranes  Neck  supple, no masses  Pulm/lungs  normal breathing effort, clear  CVS/Heart  regular rhythm, no rub or gallop  Abdomen:   Soft, nontender  Extremities:  Trace to 1+ edema  Neurologic:  Alert and oriented  Skin:  No acute rashes  Access:  Right IJ temporary dialysis catheter       Basic Metabolic Panel:  Recent Labs  Lab 11/12/17 0355 11/13/17 0703 11/14/17 9675  11/15/17 0417 11/15/17 1441 11/15/17 2254 11/16/17 0613 11/16/17 1227 11/16/17 1809 11/17/17 0612  NA 129* 125* 122*   < > 130* 132*  --  134* 133*  --  141  K 5.0 5.1 4.7  --  5.0 5.6* 6.0* 5.9* 6.2*  --  4.8  CL 94* 93* 88*  --  94* 94*  --  101 98  --  101  CO2 18* 17* 19*  --  24 25  --  22 23  --  30  GLUCOSE 236* 181* 296*  --   103* 124*  --  107* 322*  --  94  BUN 70* 85* 93*  --  98* 102*  --  100* 106*  --  72*  CREATININE 4.87* 5.38* 5.65*  --  5.58* 5.72*  --  5.68* 5.48*  --  3.97*  CALCIUM 5.9* 6.0* 5.8*  --  6.5* 6.7*  --  7.0* 7.1*  --  7.4*  MG 1.3* 2.8*  --   --   --   --   --   --   --   --  2.4  PHOS 8.9* 8.3* 7.5*  7.7*  --   --   --   --   --   --  7.6* 5.9*   < > = values in this interval not displayed.     CBC: Recent Labs  Lab 11/11/17 1226 11/12/17 0355 11/13/17 0703 11/15/17 0417  WBC 17.1* 15.9* 15.1* 12.1*  NEUTROABS 16.4*  --   --   --   HGB 11.9* 10.1* 10.6* 10.5*  HCT 37.3 31.6* 32.1* 31.4*  MCV 83.8 84.0 83.1 81.9  PLT 242 194 182 178  No results found for: HEPBSAG, HEPBSAB, HEPBIGM    Microbiology:  Recent Results (from the past 240 hour(s))  Blood Culture (routine x 2)     Status: Abnormal   Collection Time: 11/11/17 12:26 PM  Result Value Ref Range Status   Specimen Description   Final    BLOOD LEFT ANTECUBITAL Performed at [facility], [address], [place], [place] [address]    Special Requests   Final    BOTTLES DRAWN AEROBIC AND ANAEROBIC Blood Culture adequate volume Performed at [facility], [address] [place], [place] [address]    Culture  Setup Time   Final    IN BOTH AEROBIC AND ANAEROBIC BOTTLES GRAM NEGATIVE RODS CRITICAL RESULT CALLED TO, READ BACK BY AND VERIFIED WITH: C/ JASON ROBINS @0015  11/12/17 FLC Performed at [facility], [place] [address], [place], [place] [address]    Culture ESCHERICHIA COLI (A)  Final   Report Status 11/14/2017 FINAL  Final   Organism ID, Bacteria ESCHERICHIA COLI  Final      Susceptibility   Escherichia coli - MIC*    AMPICILLIN >=32 RESISTANT Resistant     CEFAZOLIN <=4 SENSITIVE Sensitive     CEFEPIME <=1 SENSITIVE Sensitive     CEFTAZIDIME <=1 SENSITIVE Sensitive     CEFTRIAXONE <=1 SENSITIVE Sensitive     CIPROFLOXACIN 1 SENSITIVE Sensitive     GENTAMICIN <=1 SENSITIVE  Sensitive     IMIPENEM <=0.25 SENSITIVE Sensitive     TRIMETH/SULFA <=20 SENSITIVE Sensitive     AMPICILLIN/SULBACTAM >=32 RESISTANT Resistant     PIP/TAZO <=4 SENSITIVE Sensitive     Extended ESBL NEGATIVE Sensitive     * ESCHERICHIA COLI  Blood Culture ID Panel (Reflexed)     Status: Abnormal   Collection Time: 11/11/17 12:26 PM  Result Value Ref Range Status   Enterococcus species NOT DETECTED NOT DETECTED Final   Listeria monocytogenes NOT DETECTED NOT DETECTED Final   Staphylococcus species NOT DETECTED NOT DETECTED Final   Staphylococcus aureus NOT DETECTED NOT DETECTED Final   Streptococcus species NOT DETECTED NOT DETECTED Final   Streptococcus agalactiae NOT DETECTED NOT DETECTED Final   Streptococcus pneumoniae NOT DETECTED NOT DETECTED Final   Streptococcus pyogenes NOT DETECTED NOT DETECTED Final   Acinetobacter baumannii NOT DETECTED NOT DETECTED Final   Enterobacteriaceae species DETECTED (A) NOT DETECTED Final    Comment: Enterobacteriaceae represent a large family of gram-negative bacteria, not a single organism. C/ JASON ROBINS @0015  11/12/17 FLC    Enterobacter cloacae complex NOT DETECTED NOT DETECTED Final   Escherichia coli DETECTED (A) NOT DETECTED Final    Comment: C/ JASON ROBINS @0015  11/12/17 FLC   Klebsiella oxytoca NOT DETECTED NOT DETECTED Final   Klebsiella pneumoniae NOT DETECTED NOT DETECTED Final   Proteus species NOT DETECTED NOT DETECTED Final   Serratia marcescens NOT DETECTED NOT DETECTED Final   Carbapenem resistance NOT DETECTED NOT DETECTED Final   Haemophilus influenzae NOT DETECTED NOT DETECTED Final   Neisseria meningitidis NOT DETECTED NOT DETECTED Final   Pseudomonas aeruginosa NOT DETECTED NOT DETECTED Final   Candida albicans NOT DETECTED NOT DETECTED Final   Candida glabrata NOT DETECTED NOT DETECTED Final   Candida krusei NOT DETECTED NOT DETECTED Final   Candida parapsilosis NOT DETECTED NOT DETECTED Final   Candida tropicalis NOT  DETECTED NOT DETECTED Final    Comment: Performed at [facility], [address] [place], [place] [address]  Blood Culture (routine x 2)  Status: Abnormal   Collection Time: 11/11/17 12:31 PM  Result Value Ref Range Status   Specimen Description   Final    BLOOD RIGHT ANTECUBITAL Performed at [facility], [address] [place], [place] [address]    Special Requests   Final    BOTTLES DRAWN AEROBIC AND ANAEROBIC Blood Culture results may not be optimal due to an excessive volume of blood received in culture bottles Performed at [facility], [address] [place], [place] [address]    Culture  Setup Time   Final    IN BOTH AEROBIC AND ANAEROBIC BOTTLES GRAM NEGATIVE RODS CRITICAL VALUE NOTED.  VALUE IS CONSISTENT WITH PREVIOUSLY REPORTED AND CALLED VALUE. Performed at [facility], [address] [place], [place] [address]    Culture (A)  Final    ESCHERICHIA COLI SUSCEPTIBILITIES PERFORMED ON PREVIOUS CULTURE WITHIN THE LAST 5 DAYS. Performed at [facility], [place] [address] [place], [place] [address]    Report Status 11/14/2017 FINAL  Final  MRSA PCR Screening     Status: None   Collection Time: 11/11/17  8:28 PM  Result Value Ref Range Status   MRSA by PCR NEGATIVE NEGATIVE Final    Comment:        The GeneXpert MRSA Assay (FDA approved for NASAL specimens only), is one component of a comprehensive MRSA colonization surveillance program. It is not intended to diagnose MRSA infection nor to guide or monitor treatment for MRSA infections. Performed at [facility], [address] [place], [place] [address]     Coagulation Studies: No results for input(s): LABPROT, INR in the last 72 hours.  Urinalysis: No results for input(s): COLORURINE, LABSPEC, PHURINE, GLUCOSEU, HGBUR, BILIRUBINUR, KETONESUR, PROTEINUR, UROBILINOGEN, NITRITE, LEUKOCYTESUR in the last 72 hours.  Invalid input(s): APPERANCEUR     Imaging: Dg Chest Port 1 View  Result Date: 11/16/2017 CLINICAL DATA:  Status post dialysis catheter placement. EXAM: PORTABLE CHEST 1 VIEW COMPARISON:  Portable chest x-ray of November 15, 2017 FINDINGS: The dialysis catheter tip projects just proximal to the cavoatrial junction. There is no postprocedure pneumothorax or hemothorax. The cardiac silhouette is enlarged. The pulmonary vascularity is mildly prominent centrally. There is stable subsegmental atelectasis in the right mid and lower lung. There is no pleural effusion. There is tortuosity of the ascending and descending thoracic aorta. IMPRESSION: The hemodialysis catheter tip projects just proximal to the right cavoatrial junction. Mild mid and lower right lung subsegmental atelectasis. Stable cardiomegaly without pulmonary edema. Thoracic aortic atherosclerosis. Electronically Signed   By: David  Martinique M.D.   On: 11/16/2017 13:38   Dg Chest Port 1 View  Result Date: 11/15/2017 CLINICAL DATA:  Acute onset of hypoxia EXAM: PORTABLE CHEST 1 VIEW COMPARISON:  11/11/2017, 07/19/2017 FINDINGS: Rotated patient exaggerating the cardiomediastinal silhouette and resulting in widened appearance of mediastinum. Cardiomegaly. Small right-sided pleural effusion. Subsegmental atelectasis at the right base. No pneumothorax. IMPRESSION: 1. Patient rotation exaggerates the cardiomediastinal silhouette 2. Cardiomegaly with suspected small right pleural effusion. Subsegmental atelectasis at the right base. Electronically Signed   By: Donavan Foil M.D.   On: 11/15/2017 22:25     Medications:   . sodium chloride 75 mL/hr at 11/15/17 2152  . meropenem (MERREM) IV Stopped (11/17/17 0233)   . budesonide (PULMICORT) nebulizer solution  0.25 mg Nebulization BID  . calcium carbonate  800 mg of elemental calcium Oral BID  . chlorhexidine  15 mL Mouth Rinse BID  . Chlorhexidine Gluconate Cloth  6 each Topical Q0600  . gabapentin  100 mg Oral TID  . heparin  injection (subcutaneous)  5,000 Units Subcutaneous Q8H  . insulin aspart  0-15 Units Subcutaneous TID WC  . insulin aspart  0-5 Units Subcutaneous QHS  . insulin glargine  10 Units Subcutaneous QHS  . ipratropium-albuterol  3 mL Nebulization Q6H  . lidocaine  10 mL Intradermal Once  . mouth rinse  15 mL Mouth Rinse q12n4p  . nicotine  14 mg Transdermal Daily  . predniSONE  30 mg Oral Q breakfast  . risperiDONE  2 mg Oral Daily   acetaminophen **OR** acetaminophen, albuterol, diphenhydrAMINE, ondansetron **OR** ondansetron (ZOFRAN) IV, oxyCODONE-acetaminophen, phenol, polyethylene glycol, traMADol  Assessment/ Plan:  62 y.o.African [place] female with COPD, tobacco abuse, depression, GERD, diabetes mellitus type II,schizophrenia, hypertension, obstructive sleep apnea, anemiawho was admitted to [facility] on6/26/2019for acute renal failure  1.  Acute renal failure.  Baseline creatinine 0.8 in March 2019 2.  Hyperkalemia 3.  Urinary tract infection with sepsis and E. coli bacteremia  4.  diabetes type 2 with renal manifestations 5.  Respiratory acidosis  Patient's clinical condition has improved since hemodialysis yesterday.  Urine output has improved to 1500 cc potassium has also improved along with mental status.  Do not anticipate further need for hemodialysis.  Continue to monitor urine output and electrolytes.  We will follow closely.    LOS: 6 Narvel Kozub 7/2/201912:01 PM  [place], [place]  Note: This note was prepared with Dragon dictation. Any transcription errors are unintentional

## 2017-11-18 LAB — CBC
HEMATOCRIT: 28.4 % — AB (ref 35.0–47.0)
HEMOGLOBIN: 9.2 g/dL — AB (ref 12.0–16.0)
MCH: 26.8 pg (ref 26.0–34.0)
MCHC: 32.6 g/dL (ref 32.0–36.0)
MCV: 82.3 fL (ref 80.0–100.0)
PLATELETS: 266 10*3/uL (ref 150–440)
RBC: 3.45 MIL/uL — AB (ref 3.80–5.20)
RDW: 16.4 % — ABNORMAL HIGH (ref 11.5–14.5)
WBC: 12.3 10*3/uL — ABNORMAL HIGH (ref 3.6–11.0)

## 2017-11-18 LAB — RENAL FUNCTION PANEL
Albumin: 1.9 g/dL — ABNORMAL LOW (ref 3.5–5.0)
Anion gap: 9 (ref 5–15)
BUN: 71 mg/dL — ABNORMAL HIGH (ref 8–23)
CALCIUM: 7.4 mg/dL — AB (ref 8.9–10.3)
CO2: 28 mmol/L (ref 22–32)
CREATININE: 3.85 mg/dL — AB (ref 0.44–1.00)
Chloride: 105 mmol/L (ref 98–111)
GFR calc non Af Amer: 12 mL/min — ABNORMAL LOW (ref >60)
GFR, EST AFRICAN AMERICAN: 13 mL/min — AB (ref >60)
GLUCOSE: 120 mg/dL — AB (ref 70–99)
Phosphorus: 4.9 mg/dL — ABNORMAL HIGH (ref 2.5–4.6)
Potassium: 4.7 mmol/L (ref 3.5–5.1)
SODIUM: 142 mmol/L (ref 135–145)

## 2017-11-18 LAB — GLUCOSE, CAPILLARY
GLUCOSE-CAPILLARY: 422 mg/dL — AB (ref 70–99)
Glucose-Capillary: 122 mg/dL — ABNORMAL HIGH (ref 70–99)
Glucose-Capillary: 127 mg/dL — ABNORMAL HIGH (ref 70–99)
Glucose-Capillary: 266 mg/dL — ABNORMAL HIGH (ref 70–99)
Glucose-Capillary: 139 mg/dL — ABNORMAL HIGH (ref 70–99)

## 2017-11-18 LAB — BASIC METABOLIC PANEL
ANION GAP: 10 (ref 5–15)
BUN: 70 mg/dL — ABNORMAL HIGH (ref 8–23)
CALCIUM: 7.4 mg/dL — AB (ref 8.9–10.3)
CHLORIDE: 104 mmol/L (ref 98–111)
CO2: 28 mmol/L (ref 22–32)
Creatinine, Ser: 3.76 mg/dL — ABNORMAL HIGH (ref 0.44–1.00)
GFR calc non Af Amer: 12 mL/min — ABNORMAL LOW (ref >60)
GFR, EST AFRICAN AMERICAN: 14 mL/min — AB (ref >60)
GLUCOSE: 122 mg/dL — AB (ref 70–99)
POTASSIUM: 4.7 mmol/L (ref 3.5–5.1)
Sodium: 142 mmol/L (ref 135–145)

## 2017-11-18 LAB — HEPATITIS B SURFACE ANTIBODY,QUALITATIVE: Hep B S Ab: NONREACTIVE

## 2017-11-18 LAB — HEPATITIS B CORE ANTIBODY, TOTAL: HEP B C TOTAL AB: NEGATIVE

## 2017-11-18 LAB — HEPATITIS C ANTIBODY

## 2017-11-18 LAB — HEPATITIS B SURFACE ANTIGEN: HEP B S AG: NEGATIVE

## 2017-11-18 MED ORDER — HYDRALAZINE HCL 20 MG/ML IJ SOLN
10.0000 mg | Freq: Four times a day (QID) | INTRAMUSCULAR | Status: DC | PRN
  Administered 2017-11-18: 10 mg via INTRAVENOUS
  Filled 2017-11-18: qty 1

## 2017-11-18 MED ORDER — DILTIAZEM HCL ER COATED BEADS 180 MG PO CP24
180.0000 mg | ORAL_CAPSULE | Freq: Every day | ORAL | Status: DC
  Administered 2017-11-18: 180 mg via ORAL
  Filled 2017-11-18 (×2): qty 1

## 2017-11-18 MED ORDER — ASPIRIN 81 MG PO CHEW
81.0000 mg | CHEWABLE_TABLET | Freq: Every day | ORAL | Status: DC
  Administered 2017-11-18 – 2017-11-19 (×2): 81 mg via ORAL
  Filled 2017-11-18 (×2): qty 1

## 2017-11-18 MED ORDER — HYDRALAZINE HCL 25 MG PO TABS
25.0000 mg | ORAL_TABLET | Freq: Three times a day (TID) | ORAL | Status: DC
  Administered 2017-11-18: 25 mg via ORAL
  Filled 2017-11-18: qty 1

## 2017-11-18 MED ORDER — INSULIN ASPART 100 UNIT/ML ~~LOC~~ SOLN
5.0000 [IU] | Freq: Three times a day (TID) | SUBCUTANEOUS | Status: DC
  Administered 2017-11-18 – 2017-11-19 (×3): 5 [IU] via SUBCUTANEOUS
  Filled 2017-11-18 (×3): qty 1

## 2017-11-18 MED ORDER — POLYSACCHARIDE IRON COMPLEX 150 MG PO CAPS
150.0000 mg | ORAL_CAPSULE | Freq: Two times a day (BID) | ORAL | Status: DC
  Administered 2017-11-18 – 2017-11-19 (×3): 150 mg via ORAL
  Filled 2017-11-18 (×4): qty 1

## 2017-11-18 MED ORDER — PANTOPRAZOLE SODIUM 40 MG PO TBEC
40.0000 mg | DELAYED_RELEASE_TABLET | Freq: Every day | ORAL | Status: DC
  Administered 2017-11-18 – 2017-11-19 (×2): 40 mg via ORAL
  Filled 2017-11-18 (×2): qty 1

## 2017-11-18 MED ORDER — CIPROFLOXACIN HCL 500 MG PO TABS
500.0000 mg | ORAL_TABLET | Freq: Every day | ORAL | Status: DC
  Administered 2017-11-18: 500 mg via ORAL
  Filled 2017-11-18: qty 1

## 2017-11-18 MED ORDER — NITROGLYCERIN 0.4 MG SL SUBL: 0.4000 mg | SUBLINGUAL_TABLET | SUBLINGUAL | Status: DC | PRN

## 2017-11-18 MED ORDER — IPRATROPIUM-ALBUTEROL 0.5-2.5 (3) MG/3ML IN SOLN
3.0000 mL | RESPIRATORY_TRACT | Status: DC | PRN
  Administered 2017-11-19: 3 mL via RESPIRATORY_TRACT
  Filled 2017-11-18: qty 3

## 2017-11-18 NOTE — Progress Notes (Signed)
Per MD okay for RN to order hydralazine PRN.

## 2017-11-18 NOTE — Care Management Important Message (Signed)
Copy of signed IM left with patient in room.  

## 2017-11-18 NOTE — Care Management (Addendum)
Patient currently requiring acute O2 2 liters.  Patient has required 1 acute HD session, currently there is no indication for acute HD.  PT is recommending no PT follow up.  At this time patient would not be appropriate for [facility]

## 2017-11-18 NOTE — Progress Notes (Signed)
Per MD okay for RN to place PT order.

## 2017-11-18 NOTE — Progress Notes (Signed)
[facility], [place] 11/18/17  Subjective:   Patient is doing better today She is able to eat without nausea or vomiting Urine output 1500 cc Serum creatinine slightly improved to 3.85   Objective:  Vital signs in last 24 hours:  Temp:  [98 F (36.7 C)-99.2 F (37.3 C)] 98.3 F (36.8 C) (07/03 0411) Pulse Rate:  [89-102] 89 (07/03 0411) Resp:  [16-23] 16 (07/03 0411) BP: (153-178)/(67-87) 158/67 (07/03 0411) SpO2:  [89 %-100 %] 89 % (07/03 0734) FiO2 (%):  [28 %] 28 % (07/03 0203) Weight:  [107.7 kg (237 lb 7 oz)-109.5 kg (241 lb 4.8 oz)] 107.7 kg (237 lb 7 oz) (07/03 0500)  Weight change: -4.947 kg (-10 lb 14.5 oz) Filed Weights   11/17/17 0500 11/17/17 2017 11/18/17 0500  Weight: 110.2 kg (242 lb 15.2 oz) 109.5 kg (241 lb 4.8 oz) 107.7 kg (237 lb 7 oz)    Intake/Output:    Intake/Output Summary (Last 24 hours) at 11/18/2017 1037 Last data filed at 11/18/2017 0708 Gross per 24 hour  Intake 820 ml  Output 1600 ml  Net -780 ml     Physical Exam: General:  Laying in the bed, no acute distress  HEENT  anicteric, moist oral mucous membranes  Neck  supple, no masses  Pulm/lungs  normal breathing effort, clear  CVS/Heart  regular rhythm, ectopic beats, no rub or gallop  Abdomen:   Soft, nontender  Extremities:  Trace to 1+ edema  Neurologic:  Alert and oriented  Skin:  No acute rashes  Access:  Right IJ temporary dialysis catheter       Basic Metabolic Panel:  Recent Labs  Lab 11/12/17 0355 11/13/17 0703 11/14/17 8250  11/15/17 1441 11/15/17 2254 11/16/17 0613 11/16/17 1227 11/16/17 1809 11/17/17 0612 11/18/17 0448  NA 129* 125* 122*   < > 132*  --  134* 133*  --  141 142  142  K 5.0 5.1 4.7   < > 5.6* 6.0* 5.9* 6.2*  --  4.8 4.7  4.7  CL 94* 93* 88*   < > 94*  --  101 98  --  101 105  104  CO2 18* 17* 19*   < > 25  --  22 23  --  30 28  28   GLUCOSE 236* 181* 296*   < > 124*  --  107* 322*  --  94 120*  122*  BUN 70*  85* 93*   < > 102*  --  100* 106*  --  72* 71*  70*  CREATININE 4.87* 5.38* 5.65*   < > 5.72*  --  5.68* 5.48*  --  3.97* 3.85*  3.76*  CALCIUM 5.9* 6.0* 5.8*   < > 6.7*  --  7.0* 7.1*  --  7.4* 7.4*  7.4*  MG 1.3* 2.8*  --   --   --   --   --   --   --  2.4  --   PHOS 8.9* 8.3* 7.5*  7.7*  --   --   --   --   --  7.6* 5.9* 4.9*   < > = values in this interval not displayed.     CBC: Recent Labs  Lab 11/11/17 1226 11/12/17 0355 11/13/17 0703 11/15/17 0417 11/18/17 0448  WBC 17.1* 15.9* 15.1* 12.1* 12.3*  NEUTROABS 16.4*  --   --   --   --   HGB 11.9* 10.1* 10.6* 10.5* 9.2*  HCT 37.3  31.6* 32.1* 31.4* 28.4*  MCV 83.8 84.0 83.1 81.9 82.3  PLT 242 194 182 178 266      Lab Results  Component Value Date   HEPBSAG Negative 11/16/2017   HEPBSAB Non Reactive 11/16/2017      Microbiology:  Recent Results (from the past 240 hour(s))  Blood Culture (routine x 2)     Status: Abnormal   Collection Time: 11/11/17 12:26 PM  Result Value Ref Range Status   Specimen Description   Final    BLOOD LEFT ANTECUBITAL Performed at [facility], [address] [place], [place] [address]    Special Requests   Final    BOTTLES DRAWN AEROBIC AND ANAEROBIC Blood Culture adequate volume Performed at [facility], [address] [place], [place] [address]    Culture  Setup Time   Final    IN BOTH AEROBIC AND ANAEROBIC BOTTLES GRAM NEGATIVE RODS CRITICAL RESULT CALLED TO, READ BACK BY AND VERIFIED WITH: C/ JASON ROBINS @0015  11/12/17 FLC Performed at [facility], [place] [address], [place], [place] [address]    Culture ESCHERICHIA COLI (A)  Final   Report Status 11/14/2017 FINAL  Final   Organism ID, Bacteria ESCHERICHIA COLI  Final      Susceptibility   Escherichia coli - MIC*    AMPICILLIN >=32 RESISTANT Resistant     CEFAZOLIN <=4 SENSITIVE Sensitive     CEFEPIME <=1 SENSITIVE Sensitive     CEFTAZIDIME <=1 SENSITIVE Sensitive     CEFTRIAXONE <=1  SENSITIVE Sensitive     CIPROFLOXACIN 1 SENSITIVE Sensitive     GENTAMICIN <=1 SENSITIVE Sensitive     IMIPENEM <=0.25 SENSITIVE Sensitive     TRIMETH/SULFA <=20 SENSITIVE Sensitive     AMPICILLIN/SULBACTAM >=32 RESISTANT Resistant     PIP/TAZO <=4 SENSITIVE Sensitive     Extended ESBL NEGATIVE Sensitive     * ESCHERICHIA COLI  Blood Culture ID Panel (Reflexed)     Status: Abnormal   Collection Time: 11/11/17 12:26 PM  Result Value Ref Range Status   Enterococcus species NOT DETECTED NOT DETECTED Final   Listeria monocytogenes NOT DETECTED NOT DETECTED Final   Staphylococcus species NOT DETECTED NOT DETECTED Final   Staphylococcus aureus NOT DETECTED NOT DETECTED Final   Streptococcus species NOT DETECTED NOT DETECTED Final   Streptococcus agalactiae NOT DETECTED NOT DETECTED Final   Streptococcus pneumoniae NOT DETECTED NOT DETECTED Final   Streptococcus pyogenes NOT DETECTED NOT DETECTED Final   Acinetobacter baumannii NOT DETECTED NOT DETECTED Final   Enterobacteriaceae species DETECTED (A) NOT DETECTED Final    Comment: Enterobacteriaceae represent a large family of gram-negative bacteria, not a single organism. C/ JASON ROBINS @0015  11/12/17 FLC    Enterobacter cloacae complex NOT DETECTED NOT DETECTED Final   Escherichia coli DETECTED (A) NOT DETECTED Final    Comment: C/ JASON ROBINS @0015  11/12/17 FLC   Klebsiella oxytoca NOT DETECTED NOT DETECTED Final   Klebsiella pneumoniae NOT DETECTED NOT DETECTED Final   Proteus species NOT DETECTED NOT DETECTED Final   Serratia marcescens NOT DETECTED NOT DETECTED Final   Carbapenem resistance NOT DETECTED NOT DETECTED Final   Haemophilus influenzae NOT DETECTED NOT DETECTED Final   Neisseria meningitidis NOT DETECTED NOT DETECTED Final   Pseudomonas aeruginosa NOT DETECTED NOT DETECTED Final   Candida albicans NOT DETECTED NOT DETECTED Final   Candida glabrata NOT DETECTED NOT DETECTED Final   Candida krusei NOT DETECTED NOT  DETECTED Final   Candida parapsilosis NOT  DETECTED NOT DETECTED Final   Candida tropicalis NOT DETECTED NOT DETECTED Final    Comment: Performed at [facility], [address] [place], [place] [address]  Blood Culture (routine x 2)     Status: Abnormal   Collection Time: 11/11/17 12:31 PM  Result Value Ref Range Status   Specimen Description   Final    BLOOD RIGHT ANTECUBITAL Performed at [facility], [address] [place], [place] [address]    Special Requests   Final    BOTTLES DRAWN AEROBIC AND ANAEROBIC Blood Culture results may not be optimal due to an excessive volume of blood received in culture bottles Performed at [facility], [address] [place], [place] [address]    Culture  Setup Time   Final    IN BOTH AEROBIC AND ANAEROBIC BOTTLES GRAM NEGATIVE RODS CRITICAL VALUE NOTED.  VALUE IS CONSISTENT WITH PREVIOUSLY REPORTED AND CALLED VALUE. Performed at [facility], [address] [place], [place] [address]    Culture (A)  Final    ESCHERICHIA COLI SUSCEPTIBILITIES PERFORMED ON PREVIOUS CULTURE WITHIN THE LAST 5 DAYS. Performed at [facility], [place] [address] [place], [place] [address]    Report Status 11/14/2017 FINAL  Final  MRSA PCR Screening     Status: None   Collection Time: 11/11/17  8:28 PM  Result Value Ref Range Status   MRSA by PCR NEGATIVE NEGATIVE Final    Comment:        The GeneXpert MRSA Assay (FDA approved for NASAL specimens only), is one component of a comprehensive MRSA colonization surveillance program. It is not intended to diagnose MRSA infection nor to guide or monitor treatment for MRSA infections. Performed at [facility], [address] [place], [place] [address]     Coagulation Studies: No results for input(s): LABPROT, INR in the last 72 hours.  Urinalysis: No results for input(s): COLORURINE, LABSPEC, PHURINE, GLUCOSEU, HGBUR, BILIRUBINUR, KETONESUR, PROTEINUR,  UROBILINOGEN, NITRITE, LEUKOCYTESUR in the last 72 hours.  Invalid input(s): APPERANCEUR    Imaging: Dg Chest Port 1 View  Result Date: 11/16/2017 CLINICAL DATA:  Status post dialysis catheter placement. EXAM: PORTABLE CHEST 1 VIEW COMPARISON:  Portable chest x-ray of November 15, 2017 FINDINGS: The dialysis catheter tip projects just proximal to the cavoatrial junction. There is no postprocedure pneumothorax or hemothorax. The cardiac silhouette is enlarged. The pulmonary vascularity is mildly prominent centrally. There is stable subsegmental atelectasis in the right mid and lower lung. There is no pleural effusion. There is tortuosity of the ascending and descending thoracic aorta. IMPRESSION: The hemodialysis catheter tip projects just proximal to the right cavoatrial junction. Mild mid and lower right lung subsegmental atelectasis. Stable cardiomegaly without pulmonary edema. Thoracic aortic atherosclerosis. Electronically Signed   By: David  Martinique M.D.   On: 11/16/2017 13:38     Medications:   . meropenem (MERREM) IV Stopped (11/18/17 0225)   . budesonide (PULMICORT) nebulizer solution  0.25 mg Nebulization BID  . calcium carbonate  800 mg of elemental calcium Oral BID  . chlorhexidine  15 mL Mouth Rinse BID  . Chlorhexidine Gluconate Cloth  6 each Topical Q0600  . gabapentin  100 mg Oral TID  . heparin injection (subcutaneous)  5,000 Units Subcutaneous Q8H  . insulin aspart  0-15 Units Subcutaneous TID WC  . insulin aspart  0-5 Units Subcutaneous QHS  . insulin glargine  10 Units Subcutaneous QHS  . lidocaine  10 mL Intradermal Once  .  mouth rinse  15 mL Mouth Rinse q12n4p  . nicotine  14 mg Transdermal Daily  . predniSONE  30 mg Oral Q breakfast  . risperiDONE  2 mg Oral Daily   acetaminophen **OR** acetaminophen, albuterol, diphenhydrAMINE, ipratropium-albuterol, ondansetron **OR** ondansetron (ZOFRAN) IV, oxyCODONE-acetaminophen, phenol, polyethylene glycol,  traMADol  Assessment/ Plan:  62 y.o.African [place] female with COPD, tobacco abuse, depression, GERD, diabetes mellitus type II,schizophrenia, hypertension, obstructive sleep apnea, anemiawho was admitted to [facility] on6[facility]26/2019for acute renal failure  1.  Acute renal failure.  Baseline creatinine 0.8 in March 2019 2.  Hyperkalemia 3.  Urinary tract infection with sepsis and E. coli bacteremia  4.  diabetes type 2 with renal manifestations   Patient's clinical condition has improved since hemodialysis.  Urine output has improved to 1500 cc potassium has also improved along with mental status.  No acute indication for HD.  Continue to monitor urine output and electrolytes.  We will follow closely.    LOS: [place] 7/3/201910:37 AM  [place], [place]  Note: This note was prepared with Dragon dictation. Any transcription errors are unintentional

## 2017-11-18 NOTE — Evaluation (Signed)
Physical Therapy Evaluation Patient Details Name: MADILYNN MONTANTE MRN: [id number] DOB: 02-07-1956 Today's Date: 11/18/2017   History of Present Illness  Patient is a 62 year old female admitted for severe dehydration and septic shock.  PMH includes DM, COPD, schizophrenia, Htn and CA.  Clinical Impression  Pt is a 62 year old female who lives in a one story home with her children.  Pt is a poor historian and requires redirection but reports that she is independent without use of AD at baseline. Pt able to perform bed mobiltiy and STS Mod I.  She presents with good overall strength of UE and LE.  PT assisted pt in standing at sink to perform hygiene and provided education concerning safe mobility at home.  Pt able to ambulate 30 ft while "furniture cruising" and PT advised pt to use her RW at home during recovery process if she needs to walk longer distances.  Pt reported no pain throughout evaluation.  Due to pt functioning at baseline and completion of education, pt will not benefit from skilled PT at this time.    Follow Up Recommendations No PT follow up    Equipment Recommendations  None recommended by PT    Recommendations for Other Services       Precautions / Restrictions Precautions Precautions: Fall Precaution Comments: MFR Restrictions Weight Bearing Restrictions: No      Mobility  Bed Mobility Overal bed mobility: Modified Independent             General bed mobility comments: Increased time and HOB elevated.  Transfers Overall transfer level: Modified independent   Transfers: Sit to/from Stand Sit to Stand: Modified independent (Device/Increase time)         General transfer comment: Uses counter to stand but did not require physical assist.  Ambulation/Gait Ambulation/Gait assistance: Supervision Gait Distance (Feet): 30 Feet       Gait velocity interpretation: >2.62 ft/sec, indicative of community ambulatory General Gait Details: Moderate foot  clearance, flexed posture, "furniture cruises".  PT discussed use of RW for safety.  Stairs            Wheelchair Mobility    Modified Rankin (Stroke Patients Only)       Balance Overall balance assessment: Modified Independent                                           Pertinent Vitals/Pain Pain Assessment: No/denies pain    Home Living Family/patient expects to be discharged to:: Private residence Living Arrangements: Children(Son and granddaughter.) Available Help at Discharge: Family Type of Home: House Home Access: Level entry     Home Layout: One level Home Equipment: Environmental consultant - 2 wheels      Prior Function Level of Independence: Independent with assistive device(s)         Comments: Pt reports mostly household ambulation and that she has a RW but has not been using it.  Pt requires redirection at times and responds to questions inconsistently.     Hand Dominance        Extremity/Trunk Assessment   Upper Extremity Assessment Upper Extremity Assessment: Overall WFL for tasks assessed    Lower Extremity Assessment Lower Extremity Assessment: Overall WFL for tasks assessed    Cervical / Trunk Assessment Cervical / Trunk Assessment: Kyphotic(Mildly kyphotic)  Communication      Cognition Arousal/Alertness: Awake/alert Behavior During Therapy:  Impulsive;WFL for tasks assessed/performed Overall Cognitive Status: No family/caregiver present to determine baseline cognitive functioning                                 General Comments: Pt follows commands inconsistently      General Comments      Exercises Other Exercises Other Exercises: Assisted pt in standing to perform hygeine at sink as well as discussed safe functional mobility around home with use of RW.  x8 min   Assessment/Plan    PT Assessment Patent does not need any further PT services  PT Problem List         PT Treatment Interventions      PT  Goals (Current goals can be found in the Care Plan section)  Acute Rehab PT Goals Patient Stated Goal: To return home and continue to be a caregiver to her family. PT Goal Formulation: With patient Time For Goal Achievement: 12/02/17 Potential to Achieve Goals: Good    Frequency     Barriers to discharge        Co-evaluation               AM-PAC PT "6 Clicks" Daily Activity  Outcome Measure Difficulty turning over in bed (including adjusting bedclothes, sheets and blankets)?: A Little Difficulty moving from lying on back to sitting on the side of the bed? : A Little Difficulty sitting down on and standing up from a chair with arms (e.g., wheelchair, bedside commode, etc,.)?: A Little Help needed moving to and from a bed to chair (including a wheelchair)?: A Little Help needed walking in hospital room?: A Little Help needed climbing 3-5 steps with a railing? : A Little 6 Click Score: 18    End of Session Equipment Utilized During Treatment: Gait belt Activity Tolerance: Patient tolerated treatment well Patient left: in chair;with chair alarm set;with call bell/phone within reach Nurse Communication: Mobility status PT Visit Diagnosis: Muscle weakness (generalized) (M62.81)    Time: 1105-1130 PT Time Calculation (min) (ACUTE ONLY): 25 min   Charges:   PT Evaluation $PT Eval Low Complexity: 1 Low PT Treatments $Therapeutic Activity: 8-22 mins   PT G Codes:   PT G-Codes **NOT FOR INPATIENT CLASS** Functional Assessment Tool Used: AM-PAC 6 Clicks Basic Mobility    Roxanne Gates, PT, DPT   Roxanne Gates 11/18/2017, 11:46 AM

## 2017-11-18 NOTE — Progress Notes (Signed)
[place] [facility] [place] NAME: Deanna Gay    MR#:  [id number]  DATE OF BIRTH:  1955/07/19  SUBJECTIVE:  CHIEF COMPLAINT:   Chief Complaint  Patient presents with  . Hypotension   The patient is drowsy and lethargic, more awake, on oxygen by nasal cannula 2 L REVIEW OF SYSTEMS:  Review of Systems  Constitutional: Positive for malaise/fatigue. Negative for chills and fever.  HENT: Negative for sore throat.   Eyes: Negative for blurred vision and double vision.  Respiratory: Negative for cough, hemoptysis, shortness of breath, wheezing and stridor.   Cardiovascular: Negative for chest pain, palpitations, orthopnea and leg swelling.  Gastrointestinal: Negative for abdominal pain, blood in stool, diarrhea, melena, nausea and vomiting.  Genitourinary: Negative for dysuria, flank pain, frequency, hematuria and urgency.  Musculoskeletal: Negative for back pain and joint pain.  Skin: Negative for itching and rash.  Neurological: Negative for dizziness, sensory change, focal weakness, seizures, loss of consciousness, weakness and headaches.  Endo/Heme/Allergies: Negative for polydipsia.  Psychiatric/Behavioral: Negative for depression. The patient is not nervous/anxious.     DRUG ALLERGIES:   Allergies  Allergen Reactions  . Iodine Swelling  . Penicillins Swelling    Has patient had a PCN reaction causing immediate rash, facial/tongue/throat swelling, SOB or lightheadedness with hypotension: Yes Has patient had a PCN reaction causing severe rash involving mucus membranes or skin necrosis: Yes Has patient had a PCN reaction that required hospitalization: No Has patient had a PCN reaction occurring within the last 10 years: No If all of the above answers are "NO", then may proceed with Cephalosporin use.    VITALS:  Blood pressure (!) 144/92, pulse 92, temperature 98 F (36.7 C), temperature source Oral, resp. rate 19, height 5' 4"   (1.626 m), weight 237 lb 7 oz (107.7 kg), SpO2 100 %. PHYSICAL EXAMINATION:  Physical Exam  Constitutional: She is oriented to person, place, and time. She appears well-developed.  Morbid Obesity.  HENT:  Head: Normocephalic.  Mouth/Throat: Oropharynx is clear and moist.  Eyes: Pupils are equal, round, and reactive to light. Conjunctivae and EOM are normal. No scleral icterus.  Neck: Normal range of motion. Neck supple. No JVD present. No tracheal deviation present.  Cardiovascular: Normal rate, regular rhythm and normal heart sounds. Exam reveals no gallop.  No murmur heard. Pulmonary/Chest: Effort normal and breath sounds normal. No respiratory distress. She has no wheezes. She has no rales.  Abdominal: Soft. Bowel sounds are normal. She exhibits no distension. There is no tenderness. There is no rebound.  Musculoskeletal: Normal range of motion. She exhibits no edema or tenderness.  Neurological: She is alert and oriented to person, place, and time. No cranial nerve deficit.  Skin: No rash noted. No erythema.  Psychiatric:  Lethargic and drowsy.   LABORATORY PANEL:  Female CBC Recent Labs  Lab 11/18/17 0448  WBC 12.3*  HGB 9.2*  HCT 28.4*  PLT 266   ------------------------------------------------------------------------------------------------------------------ Chemistries  Recent Labs  Lab 11/15/17 0417  11/17/17 0612 11/18/17 0448  NA 130*   < > 141 142  142  K 5.0   < > 4.8 4.7  4.7  CL 94*   < > 101 105  104  CO2 24   < > 30 28  28   GLUCOSE 103*   < > 94 120*  122*  BUN 98*   < > 72* 71*  70*  CREATININE 5.58*   < > 3.97* 3.85*  3.76*  CALCIUM 6.5*   < > 7.4* 7.4*  7.4*  MG  --   --  2.4  --   AST 12*  --   --   --   ALT 13  --   --   --   ALKPHOS 71  --   --   --   BILITOT 0.3  --   --   --    < > = values in this interval not displayed.   RADIOLOGY:  No results found. ASSESSMENT AND PLAN:   *Severe sepsis with septic shock due to UTI and E.  coli bacteremia.   The patient got fluid resuscitation as per sepsis protocol.  Hypotension improved. Started on IV aztreonam in the emergency room.   Continue meropenem.  Urine culture and blood culture show E. coli.  Follow-up CBC. sensitivity: Sensitive to many antibiotics, but has allergy to penicillin.  *Acute kidney injury.  Likely secondary to ATN due to severe sepsis and hypotension.     Renal ultrasound did not show any obstruction.  Hold Vasotec and HCTZ. Since the patient's renal function is improving and she has good urine output after hemodialysis once, no need for further hemodialysis per Dr. Candiss Norse. Follow-up BMP.  Hyponatremia.  Improved with hypertonic saline, change from hypertonic saline to normal saline per Dr. Juleen China.  Hypomagnesemia.  Improved with magnesium supplement.  *Diabetes mellitus.  Sliding scale insulin.  Hold metformin.  Lantus 10 units at bedtime.  NovoLog 5 units 3 times daily.  *Chronic respiratory failure with COPD exacerbation.   Changed to prednisone p.o. and continue nebulizers.   Acute on chronic respiratory failure with hypercapnia. Improved, off BIPA, Pon O2 [place] 2 L. NEB prn.  Hyperkalemia.  Improved with hemodialysis. Tobacco abuse.  Smoking cessation was counseled for 3 minutes.  Morbid obesity and OSA.  CPAP at night.  Accelerated hypertension.  Resume Cardizem, IV hydralazine PRN. All the records are reviewed and case discussed with Care Management/Social Worker. Management plans discussed with the patient, daughter-in-law and they are in agreement.  CODE STATUS: Full Code  TOTAL TIME TAKING CARE OF THIS PATIENT: 36 minutes.   More than 50% of the time was spent in counseling/coordination of care: YES  POSSIBLE D/C IN 2 DAYS, DEPENDING ON CLINICAL CONDITION.   Demetrios Loll M.D on 11/18/2017 at 1:34 PM  Between 7am to 6pm - Pager - [phone]  After 6pm go to [email or web address] - Patent attorney  Hospitalists

## 2017-11-19 LAB — BASIC METABOLIC PANEL
Anion gap: 10 (ref 5–15)
BUN: 61 mg/dL — AB (ref 8–23)
CALCIUM: 7.6 mg/dL — AB (ref 8.9–10.3)
CO2: 30 mmol/L (ref 22–32)
Chloride: 104 mmol/L (ref 98–111)
Creatinine, Ser: 2.76 mg/dL — ABNORMAL HIGH (ref 0.44–1.00)
GFR calc Af Amer: 20 mL/min — ABNORMAL LOW (ref >60)
GFR, EST NON AFRICAN AMERICAN: 17 mL/min — AB (ref >60)
GLUCOSE: 108 mg/dL — AB (ref 70–99)
POTASSIUM: 4.8 mmol/L (ref 3.5–5.1)
SODIUM: 144 mmol/L (ref 135–145)

## 2017-11-19 LAB — GLUCOSE, CAPILLARY
GLUCOSE-CAPILLARY: 140 mg/dL — AB (ref 70–99)
Glucose-Capillary: 128 mg/dL — ABNORMAL HIGH (ref 70–99)
Glucose-Capillary: 167 mg/dL — ABNORMAL HIGH (ref 70–99)

## 2017-11-19 MED ORDER — CIPROFLOXACIN HCL 500 MG PO TABS: 500.0000 mg | ORAL_TABLET | Freq: Every day | ORAL | 0 refills | Status: AC

## 2017-11-19 MED ORDER — INSULIN GLARGINE 100 UNIT/ML ~~LOC~~ SOLN: 10.0000 [IU] | Freq: Every day | SUBCUTANEOUS | 11 refills | Status: DC

## 2017-11-19 NOTE — Progress Notes (Signed)
Pt discharged per MD order. IV removed. IJ removed earlier in the shift. Dsicharge instructions reviewed with pt. Pt verbalized understanding and all questions answered to pt satisfaction. Pt taken to car in wheelchair by staff.

## 2017-11-19 NOTE — Progress Notes (Signed)
[facility], [place] 11/19/17  Subjective:   Patient is doing better today She is able to eat without nausea or vomiting Urine output 1100 cc Serum creatinine slightly improved to 2.76  Objective:  Vital signs in last 24 hours:  Temp:  [97.5 F (36.4 C)-98.6 F (37 C)] 97.5 F (36.4 C) (07/04 0251) Pulse Rate:  [69-95] 77 (07/04 0251) Resp:  [16-20] 20 (07/04 0251) BP: (97-210)/(67-92) 97/84 (07/04 0251) SpO2:  [96 %-100 %] 96 % (07/04 0758) Weight:  [110.5 kg (243 lb 11.2 oz)] 110.5 kg (243 lb 11.2 oz) (07/04 0500)  Weight change: 1.089 kg (2 lb 6.4 oz) Filed Weights   11/17/17 2017 11/18/17 0500 11/19/17 0500  Weight: 109.5 kg (241 lb 4.8 oz) 107.7 kg (237 lb 7 oz) 110.5 kg (243 lb 11.2 oz)    Intake/Output:    Intake/Output Summary (Last 24 hours) at 11/19/2017 0916 Last data filed at 11/18/2017 2041 Gross per 24 hour  Intake -  Output 600 ml  Net -600 ml     Physical Exam: General:  Laying in the bed, no acute distress  HEENT  anicteric, moist oral mucous membranes  Neck  supple, no masses  Pulm/lungs  normal breathing effort, clear  CVS/Heart  regular rhythm, ectopic beats, no rub or gallop  Abdomen:   Soft, nontender  Extremities:  Trace to 1+ edema  Neurologic:  Alert and oriented  Skin:  No acute rashes  Access:  Right IJ temporary dialysis catheter       Basic Metabolic Panel:  Recent Labs  Lab 11/13/17 0703 11/14/17 0646  11/16/17 1700 11/16/17 1227 11/16/17 1809 11/17/17 0612 11/18/17 0448 11/19/17 0517  NA 125* 122*   < > 134* 133*  --  141 142  142 144  K 5.1 4.7   < > 5.9* 6.2*  --  4.8 4.7  4.7 4.8  CL 93* 88*   < > 101 98  --  101 105  104 104  CO2 17* 19*   < > 22 23  --  30 28  28 30   GLUCOSE 181* 296*   < > 107* 322*  --  94 120*  122* 108*  BUN 85* 93*   < > 100* 106*  --  72* 71*  70* 61*  CREATININE 5.38* 5.65*   < > 5.68* 5.48*  --  3.97* 3.85*  3.76* 2.76*  CALCIUM 6.0* 5.8*   < > 7.0* 7.1*   --  7.4* 7.4*  7.4* 7.6*  MG 2.8*  --   --   --   --   --  2.4  --   --   PHOS 8.3* 7.5*  7.7*  --   --   --  7.6* 5.9* 4.9*  --    < > = values in this interval not displayed.     CBC: Recent Labs  Lab 11/13/17 0703 11/15/17 0417 11/18/17 0448  WBC 15.1* 12.1* 12.3*  HGB 10.6* 10.5* 9.2*  HCT 32.1* 31.4* 28.4*  MCV 83.1 81.9 82.3  PLT 182 178 266      Lab Results  Component Value Date   HEPBSAG Negative 11/16/2017   HEPBSAB Non Reactive 11/16/2017      Microbiology:  Recent Results (from the past 240 hour(s))  Blood Culture (routine x 2)     Status: Abnormal   Collection Time: 11/11/17 12:26 PM  Result Value Ref Range Status   Specimen Description   Final  BLOOD LEFT ANTECUBITAL Performed at [facility], [address] [place], [place] [address]    Special Requests   Final    BOTTLES DRAWN AEROBIC AND ANAEROBIC Blood Culture adequate volume Performed at [facility], [address] [place], [place] [address]    Culture  Setup Time   Final    IN BOTH AEROBIC AND ANAEROBIC BOTTLES GRAM NEGATIVE RODS CRITICAL RESULT CALLED TO, READ BACK BY AND VERIFIED WITH: C/ JASON ROBINS @0015  11/12/17 FLC Performed at [facility], [place] [address], [place], [place] [address]    Culture ESCHERICHIA COLI (A)  Final   Report Status 11/14/2017 FINAL  Final   Organism ID, Bacteria ESCHERICHIA COLI  Final      Susceptibility   Escherichia coli - MIC*    AMPICILLIN >=32 RESISTANT Resistant     CEFAZOLIN <=4 SENSITIVE Sensitive     CEFEPIME <=1 SENSITIVE Sensitive     CEFTAZIDIME <=1 SENSITIVE Sensitive     CEFTRIAXONE <=1 SENSITIVE Sensitive     CIPROFLOXACIN 1 SENSITIVE Sensitive     GENTAMICIN <=1 SENSITIVE Sensitive     IMIPENEM <=0.25 SENSITIVE Sensitive     TRIMETH/SULFA <=20 SENSITIVE Sensitive     AMPICILLIN/SULBACTAM >=32 RESISTANT Resistant     PIP/TAZO <=4 SENSITIVE Sensitive     Extended ESBL NEGATIVE Sensitive     * ESCHERICHIA  COLI  Blood Culture ID Panel (Reflexed)     Status: Abnormal   Collection Time: 11/11/17 12:26 PM  Result Value Ref Range Status   Enterococcus species NOT DETECTED NOT DETECTED Final   Listeria monocytogenes NOT DETECTED NOT DETECTED Final   Staphylococcus species NOT DETECTED NOT DETECTED Final   Staphylococcus aureus NOT DETECTED NOT DETECTED Final   Streptococcus species NOT DETECTED NOT DETECTED Final   Streptococcus agalactiae NOT DETECTED NOT DETECTED Final   Streptococcus pneumoniae NOT DETECTED NOT DETECTED Final   Streptococcus pyogenes NOT DETECTED NOT DETECTED Final   Acinetobacter baumannii NOT DETECTED NOT DETECTED Final   Enterobacteriaceae species DETECTED (A) NOT DETECTED Final    Comment: Enterobacteriaceae represent a large family of gram-negative bacteria, not a single organism. C/ JASON ROBINS @0015  11/12/17 FLC    Enterobacter cloacae complex NOT DETECTED NOT DETECTED Final   Escherichia coli DETECTED (A) NOT DETECTED Final    Comment: C/ JASON ROBINS @0015  11/12/17 FLC   Klebsiella oxytoca NOT DETECTED NOT DETECTED Final   Klebsiella pneumoniae NOT DETECTED NOT DETECTED Final   Proteus species NOT DETECTED NOT DETECTED Final   Serratia marcescens NOT DETECTED NOT DETECTED Final   Carbapenem resistance NOT DETECTED NOT DETECTED Final   Haemophilus influenzae NOT DETECTED NOT DETECTED Final   Neisseria meningitidis NOT DETECTED NOT DETECTED Final   Pseudomonas aeruginosa NOT DETECTED NOT DETECTED Final   Candida albicans NOT DETECTED NOT DETECTED Final   Candida glabrata NOT DETECTED NOT DETECTED Final   Candida krusei NOT DETECTED NOT DETECTED Final   Candida parapsilosis NOT DETECTED NOT DETECTED Final   Candida tropicalis NOT DETECTED NOT DETECTED Final    Comment: Performed at [facility], [address] [place], [place] [address]  Blood Culture (routine x 2)     Status: Abnormal   Collection Time: 11/11/17 12:31 PM  Result Value Ref Range Status    Specimen Description   Final    BLOOD RIGHT ANTECUBITAL Performed at [facility], [address] [place], [place] [address]    Special Requests   Final  BOTTLES DRAWN AEROBIC AND ANAEROBIC Blood Culture results may not be optimal due to an excessive volume of blood received in culture bottles Performed at [facility], [address] [place], [place] [address]    Culture  Setup Time   Final    IN BOTH AEROBIC AND ANAEROBIC BOTTLES GRAM NEGATIVE RODS CRITICAL VALUE NOTED.  VALUE IS CONSISTENT WITH PREVIOUSLY REPORTED AND CALLED VALUE. Performed at [facility], [address] [place], [place] [address]    Culture (A)  Final    ESCHERICHIA COLI SUSCEPTIBILITIES PERFORMED ON PREVIOUS CULTURE WITHIN THE LAST 5 DAYS. Performed at [facility], [place] [address] [place], [place] [address]    Report Status 11/14/2017 FINAL  Final  MRSA PCR Screening     Status: None   Collection Time: 11/11/17  8:28 PM  Result Value Ref Range Status   MRSA by PCR NEGATIVE NEGATIVE Final    Comment:        The GeneXpert MRSA Assay (FDA approved for NASAL specimens only), is one component of a comprehensive MRSA colonization surveillance program. It is not intended to diagnose MRSA infection nor to guide or monitor treatment for MRSA infections. Performed at [facility], [address] [place], [place] [address]     Coagulation Studies: No results for input(s): LABPROT, INR in the last 72 hours.  Urinalysis: No results for input(s): COLORURINE, LABSPEC, PHURINE, GLUCOSEU, HGBUR, BILIRUBINUR, KETONESUR, PROTEINUR, UROBILINOGEN, NITRITE, LEUKOCYTESUR in the last 72 hours.  Invalid input(s): APPERANCEUR    Imaging: No results found.   Medications:    . aspirin  81 mg Oral Daily  . budesonide (PULMICORT) nebulizer solution  0.25 mg Nebulization BID  . calcium carbonate  800 mg of elemental calcium Oral BID  . chlorhexidine  15 mL Mouth  Rinse BID  . Chlorhexidine Gluconate Cloth  6 each Topical Q0600  . ciprofloxacin  500 mg Oral q1800  . diltiazem  180 mg Oral QHS  . gabapentin  100 mg Oral TID  . heparin injection (subcutaneous)  5,000 Units Subcutaneous Q8H  . insulin aspart  0-15 Units Subcutaneous TID WC  . insulin aspart  0-5 Units Subcutaneous QHS  . insulin aspart  5 Units Subcutaneous TID WC  . insulin glargine  10 Units Subcutaneous QHS  . iron polysaccharides  150 mg Oral BID  . lidocaine  10 mL Intradermal Once  . mouth rinse  15 mL Mouth Rinse q12n4p  . nicotine  14 mg Transdermal Daily  . pantoprazole  40 mg Oral Daily  . risperiDONE  2 mg Oral Daily   acetaminophen **OR** acetaminophen, albuterol, diphenhydrAMINE, hydrALAZINE, ipratropium-albuterol, nitroGLYCERIN, ondansetron **OR** ondansetron (ZOFRAN) IV, oxyCODONE-acetaminophen, phenol, polyethylene glycol, traMADol  Assessment/ Plan:  62 y.o.African [place] female with COPD, tobacco abuse, depression, GERD, diabetes mellitus type II,schizophrenia, hypertension, obstructive sleep apnea, anemiawho was admitted to [facility] on6/26/2019for acute renal failure  1.  Acute renal failure.  Baseline creatinine 0.8 in March 2019 2.  Hyperkalemia 3.  Urinary tract infection with sepsis and E. coli bacteremia  4.  diabetes type 2 with renal manifestations  Patient's clinical condition has improved since hemodialysis.  Urine output is satisfactory.  potassium has also improved along with mental status.  No further need for HD. Remove HD cathter. May d/c from renal standpoint. Follow up outpatient.    LOS: [place] 7/4/20199:16 AM  [place], [place]  Note: This note was prepared with Dragon dictation. Any  transcription errors are unintentional

## 2017-11-19 NOTE — Progress Notes (Signed)
SATURATION QUALIFICATIONS: (This note is used to comply with regulatory documentation for home oxygen)  Patient Saturations on Room Air at Rest =99%  Patient Saturations on Room Air while Ambulating = 94%    Please briefly explain why patient needs home oxygen:

## 2017-11-19 NOTE — Care Management Note (Addendum)
Case Management Note  Patient Details  Name: Deanna Gay MRN: [id number] Date of Birth: 12-Mar-1956   Patient to discharge home today.  Patient admitted with sepsis and AKI.  Patient lives at home with son.  Son provides transportation.  PCP Ouida Sills.  Patient states that she has a RW and BSC in the home.  Patient has chronic nocturnal O2 through [employer] and CPAP.  Patient has been requiring acute o2 while in hospital.  Bedside RN to check qualifying sats. Home health orders have been written for RN.  Patient agreeable to home health services and states she does not have a preference of agency.  Referral made to [facility] with [place].  With patient's dx of COPD and elevated CO2 patient would potenitally qualify for a triology.  Per MD she does not feel that it is indicated to pursue triology at this time and wishes for patient to return home on CPAP   Update:  Per nursing staff patient maintains saturations on RA with exertion and will not require O2 at discharge  Subjective/Objective:                    Action/Plan:   Expected Discharge Date:  11/19/17               Expected Discharge Plan:  [place]  In-House Referral:     Discharge planning Services  CM Consult  Post Acute Care Choice:  Home Health Choice offered to:  Patient  DME Arranged:    DME Agency:     HH Arranged:  RN [place] Agency:  [place]  Status of Service:  Completed, signed off  If discussed at [place] of Stay Meetings, dates discussed:    Additional Comments:  Beverly Sessions, RN 11/19/2017, 11:01 AM

## 2017-11-19 NOTE — Discharge Summary (Signed)
[facility] NAME: Logann Whitebread    MR#:  [id number]  DATE OF BIRTH:  1956/02/14  DATE OF ADMISSION:  11/11/2017 ADMITTING PHYSICIAN: Hillary Bow, MD  DATE OF DISCHARGE: 11/19/2017  PRIMARY CARE PHYSICIAN: Kirk Ruths, MD    ADMISSION DIAGNOSIS:  Severe dehydration [E86.0] AKI (acute kidney injury) ([place]) [N17.9] COPD with acute exacerbation ([place]) [J44.1] Acute kidney injury ([place]) [N17.9] Septic shock ([place]) [A41.9, R65.21] Severe sepsis ([place]) [A41.9, R65.20]  DISCHARGE DIAGNOSIS:  Active Problems:   Severe sepsis ([place])   SECONDARY DIAGNOSIS:   Past Medical History:  Diagnosis Date  . Arthritis   . Cancer ([place])    cervical  . COPD (chronic obstructive pulmonary disease) ([place])   . Depression   . Diabetes mellitus without complication ([place])   . GERD (gastroesophageal reflux disease)   . Hypertension   . Schizophrenia ([place])   . Sleep apnea     HOSPITAL COURSE:  62 year old female with history of diabetes, chronic hypoxic respiratory failure due to COPD, OSA on CPAP who presented to the emergency room complaining of weakness and shortness of breath.  1.  Severe sepsis due to UTI and E. coli bacteremia: Patient was on sepsis protocol.  She was started on IV antibiotics.   Blood culture shows E. coli.  She is changed to ciprofloxacin.  She will need 2 weeks of antibiotics. Sepsis is resolved.  2.  Acute renal failure: This is due to ATN and sepsis.  Creatinine is improving.  Urine output has also improved.  Patient was eval by cardiology.  She did have dialysis x1 while in the hospital.  No indication for dialysis.  Patient will have follow-up with nephrology as an outpatient.  We will continue to hold nephrotoxic medications.  3.  Essential hypertension: Patient will continue diltiazem.  Enalapril and HCTZ have been discontinued for now due to renal failure.  4.  Diabetes: Patient will continue outpatient  medications with insulin and ADA diet.  Metformin has been discontinued due to renal failure.  5.  Chronic hypoxic respiratory failure with COPD exacerbation: Patient was on BiPAP and has been weaned to baseline oxygen. He does not need steroids upon discharge.  6.Tobacco dependence: Patient is encouraged to quit smoking. Counseling was provided for 4 minutes.    DISCHARGE CONDITIONS AND DIET:   Stable for discharge on heart healthy diabetic diet  CONSULTS OBTAINED:  Treatment Team:  Lavonia Dana, MD Algernon Huxley, MD  DRUG ALLERGIES:   Allergies  Allergen Reactions  . Iodine Swelling  . Penicillins Swelling    Has patient had a PCN reaction causing immediate rash, facial/tongue/throat swelling, SOB or lightheadedness with hypotension: Yes Has patient had a PCN reaction causing severe rash involving mucus membranes or skin necrosis: Yes Has patient had a PCN reaction that required hospitalization: No Has patient had a PCN reaction occurring within the last 10 years: No If all of the above answers are "NO", then may proceed with Cephalosporin use.     DISCHARGE MEDICATIONS:   Allergies as of 11/19/2017      Reactions   Iodine Swelling   Penicillins Swelling   Has patient had a PCN reaction causing immediate rash, facial/tongue/throat swelling, SOB or lightheadedness with hypotension: Yes Has patient had a PCN reaction causing severe rash involving mucus membranes or skin necrosis: Yes Has patient had a PCN reaction that required hospitalization: No Has patient had a PCN reaction occurring within the last 10  years: No If all of the above answers are "NO", then may proceed with Cephalosporin use.      Medication List    STOP taking these medications   benzonatate 200 MG capsule Commonly known as:  TESSALON   enalapril 20 MG tablet Commonly known as:  VASOTEC   guaiFENesin-dextromethorphan 100-10 MG/5ML syrup Commonly known as:  ROBITUSSIN DM   hydrochlorothiazide  25 MG tablet Commonly known as:  HYDRODIURIL   metFORMIN 1000 MG tablet Commonly known as:  GLUCOPHAGE   methylPREDNISolone 4 MG Tbpk tablet Commonly known as:  MEDROL DOSEPAK   predniSONE 10 MG tablet Commonly known as:  DELTASONE     TAKE these medications   albuterol 108 (90 Base) MCG/ACT inhaler Commonly known as:  PROVENTIL HFA;VENTOLIN HFA Inhale 2 puffs into the lungs every 4 (four) hours as needed for wheezing or shortness of breath.   aspirin 81 MG chewable tablet Chew 1 tablet (81 mg total) by mouth 2 (two) times daily. What changed:  when to take this   budesonide-formoterol 160-4.5 MCG/ACT inhaler Commonly known as:  SYMBICORT Inhale 2 puffs into the lungs 2 (two) times daily.   ciprofloxacin 500 MG tablet Commonly known as:  CIPRO Take 1 tablet (500 mg total) by mouth daily at 6 PM for 12 days.   diltiazem 180 MG 24 hr capsule Commonly known as:  CARDIZEM CD Take 180 mg by mouth at bedtime.   insulin glargine 100 UNIT/ML injection Commonly known as:  LANTUS Inject 0.1 mLs (10 Units total) into the skin at bedtime. What changed:    how much to take  when to take this   insulin lispro 100 UNIT/ML injection Commonly known as:  HUMALOG Please check your sugars before each meal and use lispro per the below sliding scale:  For sugars 151-200: take 2 units For 201-250: take 4units For 251-300: take 6 units For 301-350: take 8 units For 351-400: take 10 units For > 401: call MD   ipratropium-albuterol 0.5-2.5 (3) MG/3ML Soln Commonly known as:  DUONEB Take 3 mLs by nebulization every 6 (six) hours as needed.   iron polysaccharides 150 MG capsule Commonly known as:  IFEREX 150 Take 1 capsule (150 mg total) by mouth 2 (two) times daily.   nicotine 21 mg/24hr patch Commonly known as:  NICODERM CQ - dosed in mg/24 hours Place 1 patch onto the skin daily.   nitroGLYCERIN 0.4 MG SL tablet Commonly known as:  NITROSTAT Place 1 tablet under the tongue  every 5 (five) minutes as needed.   omeprazole 20 MG capsule Commonly known as:  PRILOSEC Take 20 mg by mouth 2 (two) times daily before a meal.   oxyCODONE-acetaminophen 7.5-325 MG tablet Commonly known as:  PERCOCET Take 1-2 tablets by mouth every 6 (six) hours as needed for severe pain.   triamcinolone 0.1 % paste Commonly known as:  KENALOG Use as directed 1 application in the mouth or throat daily.   triamcinolone cream 0.1 % Commonly known as:  KENALOG Apply 1 application topically 2 (two) times daily.         Today   CHIEF COMPLAINT:  Stable for discharge on diabetic heart healthy diet   VITAL SIGNS:  Blood pressure 97/84, pulse 77, temperature (!) 97.5 F (36.4 C), temperature source Oral, resp. rate 20, height 5' 4"  (1.626 m), weight 110.5 kg (243 lb 11.2 oz), SpO2 96 %.   REVIEW OF SYSTEMS:  Review of Systems  Constitutional: Negative.  Negative for  chills, fever and malaise/fatigue.  HENT: Negative.  Negative for ear discharge, ear pain, hearing loss, nosebleeds and sore throat.   Eyes: Negative.  Negative for blurred vision and pain.  Respiratory: Negative.  Negative for cough, hemoptysis, shortness of breath and wheezing.   Cardiovascular: Negative.  Negative for chest pain, palpitations and leg swelling.  Gastrointestinal: Negative.  Negative for abdominal pain, blood in stool, diarrhea, nausea and vomiting.  Genitourinary: Negative.  Negative for dysuria.  Musculoskeletal: Negative.  Negative for back pain.  Skin: Negative.   Neurological: Negative for dizziness, tremors, speech change, focal weakness, seizures and headaches.  Endo/Heme/Allergies: Negative.  Does not bruise/bleed easily.  Psychiatric/Behavioral: Negative.  Negative for depression, hallucinations and suicidal ideas.     PHYSICAL EXAMINATION:  GENERAL:  62 y.o.-year-old patient lying in the bed with no acute distress.  NECK:  Supple, no jugular venous distention. No thyroid  enlargement, no tenderness.  LUNGS: Normal breath sounds bilaterally, no wheezing, rales,rhonchi  No use of accessory muscles of respiration.  CARDIOVASCULAR: S1, S2 normal. No murmurs, rubs, or gallops.  ABDOMEN: Soft, non-tender, non-distended. Bowel sounds present. No organomegaly or mass.  EXTREMITIES: No pedal edema, cyanosis, or clubbing.  PSYCHIATRIC: The patient is alert and oriented x 3.  SKIN: No obvious rash, lesion, or ulcer.   DATA REVIEW:   CBC Recent Labs  Lab 11/18/17 0448  WBC 12.3*  HGB 9.2*  HCT 28.4*  PLT 266    Chemistries  Recent Labs  Lab 11/15/17 0417  11/17/17 0612  11/19/17 0517  NA 130*   < > 141   < > 144  K 5.0   < > 4.8   < > 4.8  CL 94*   < > 101   < > 104  CO2 24   < > 30   < > 30  GLUCOSE 103*   < > 94   < > 108*  BUN 98*   < > 72*   < > 61*  CREATININE 5.58*   < > 3.97*   < > 2.76*  CALCIUM 6.5*   < > 7.4*   < > 7.6*  MG  --   --  2.4  --   --   AST 12*  --   --   --   --   ALT 13  --   --   --   --   ALKPHOS 71  --   --   --   --   BILITOT 0.3  --   --   --   --    < > = values in this interval not displayed.    Cardiac Enzymes Recent Labs  Lab 11/12/17 1044  TROPONINI 0.03*    Microbiology Results  @MICRORSLT48 @  RADIOLOGY:  No results found.    Allergies as of 11/19/2017      Reactions   Iodine Swelling   Penicillins Swelling   Has patient had a PCN reaction causing immediate rash, facial/tongue/throat swelling, SOB or lightheadedness with hypotension: Yes Has patient had a PCN reaction causing severe rash involving mucus membranes or skin necrosis: Yes Has patient had a PCN reaction that required hospitalization: No Has patient had a PCN reaction occurring within the last 10 years: No If all of the above answers are "NO", then may proceed with Cephalosporin use.      Medication List    STOP taking these medications   benzonatate 200 MG capsule Commonly known as:  TESSALON  enalapril 20 MG tablet Commonly  known as:  VASOTEC   guaiFENesin-dextromethorphan 100-10 MG/5ML syrup Commonly known as:  ROBITUSSIN DM   hydrochlorothiazide 25 MG tablet Commonly known as:  HYDRODIURIL   metFORMIN 1000 MG tablet Commonly known as:  GLUCOPHAGE   methylPREDNISolone 4 MG Tbpk tablet Commonly known as:  MEDROL DOSEPAK   predniSONE 10 MG tablet Commonly known as:  DELTASONE     TAKE these medications   albuterol 108 (90 Base) MCG/ACT inhaler Commonly known as:  PROVENTIL HFA;VENTOLIN HFA Inhale 2 puffs into the lungs every 4 (four) hours as needed for wheezing or shortness of breath.   aspirin 81 MG chewable tablet Chew 1 tablet (81 mg total) by mouth 2 (two) times daily. What changed:  when to take this   budesonide-formoterol 160-4.5 MCG/ACT inhaler Commonly known as:  SYMBICORT Inhale 2 puffs into the lungs 2 (two) times daily.   ciprofloxacin 500 MG tablet Commonly known as:  CIPRO Take 1 tablet (500 mg total) by mouth daily at 6 PM for 12 days.   diltiazem 180 MG 24 hr capsule Commonly known as:  CARDIZEM CD Take 180 mg by mouth at bedtime.   insulin glargine 100 UNIT/ML injection Commonly known as:  LANTUS Inject 0.1 mLs (10 Units total) into the skin at bedtime. What changed:    how much to take  when to take this   insulin lispro 100 UNIT/ML injection Commonly known as:  HUMALOG Please check your sugars before each meal and use lispro per the below sliding scale:  For sugars 151-200: take 2 units For 201-250: take 4units For 251-300: take 6 units For 301-350: take 8 units For 351-400: take 10 units For > 401: call MD   ipratropium-albuterol 0.5-2.5 (3) MG/3ML Soln Commonly known as:  DUONEB Take 3 mLs by nebulization every 6 (six) hours as needed.   iron polysaccharides 150 MG capsule Commonly known as:  IFEREX 150 Take 1 capsule (150 mg total) by mouth 2 (two) times daily.   nicotine 21 mg/24hr patch Commonly known as:  NICODERM CQ - dosed in mg/24  hours Place 1 patch onto the skin daily.   nitroGLYCERIN 0.4 MG SL tablet Commonly known as:  NITROSTAT Place 1 tablet under the tongue every 5 (five) minutes as needed.   omeprazole 20 MG capsule Commonly known as:  PRILOSEC Take 20 mg by mouth 2 (two) times daily before a meal.   oxyCODONE-acetaminophen 7.5-325 MG tablet Commonly known as:  PERCOCET Take 1-2 tablets by mouth every 6 (six) hours as needed for severe pain.   triamcinolone 0.1 % paste Commonly known as:  KENALOG Use as directed 1 application in the mouth or throat daily.   triamcinolone cream 0.1 % Commonly known as:  KENALOG Apply 1 application topically 2 (two) times daily.           Management plans discussed with the patient and she is in agreement. Stable for discharge home with [facility]  Patient should follow up with dr Candiss Norse  CODE STATUS:     Code Status Orders  (From admission, onward)        Start     Ordered   11/11/17 1345  Full code  Continuous     11/11/17 1346    Code Status History    Date Active Date Inactive Code Status Order ID Comments User Context   07/19/2017 1350 07/20/2017 1958 Full Code [id number]  Loletha Grayer, MD ED   05/16/2016 1626 05/20/2016 1456  Full Code [id number]  Mcarthur Rossetti, MD Inpatient   02/27/2015 0122 02/28/2015 1635 Full Code [id number]  Harrie Foreman, MD Inpatient      TOTAL TIME TAKING CARE OF THIS PATIENT: 38 minutes.    Note: This dictation was prepared with Dragon dictation along with smaller phrase technology. Any transcriptional errors that result from this process are unintentional.  Janequa Kipnis M.D on 11/19/2017 at 10:40 AM  Between 7am to 6pm - Pager - [phone] After 6pm go to [email or web address] - password [email or web address] [facility]  Office  [phone]  CC: Primary care physician; Kirk Ruths, MD

## 2017-12-04 ENCOUNTER — Encounter: Payer: Self-pay | Admitting: Urology

## 2017-12-04 ENCOUNTER — Ambulatory Visit (INDEPENDENT_AMBULATORY_CARE_PROVIDER_SITE_OTHER): Payer: Medicare Other | Admitting: Urology

## 2017-12-04 VITALS — BP 110/67 | HR 110 | Resp 16 | Ht 64.0 in | Wt 240.0 lb

## 2017-12-04 DIAGNOSIS — R3129 Other microscopic hematuria: Secondary | ICD-10-CM | POA: Diagnosis not present

## 2017-12-04 DIAGNOSIS — R8271 Bacteriuria: Secondary | ICD-10-CM

## 2017-12-04 LAB — URINALYSIS, COMPLETE
Bilirubin, UA: NEGATIVE
Glucose, UA: NEGATIVE
Ketones, UA: NEGATIVE
Nitrite, UA: NEGATIVE
PH UA: 7.5 (ref 5.0–7.5)
Specific Gravity, UA: 1.015 (ref 1.005–1.030)
Urobilinogen, Ur: 0.2 mg/dL (ref 0.2–1.0)

## 2017-12-04 LAB — MICROSCOPIC EXAMINATION

## 2017-12-04 NOTE — Progress Notes (Signed)
12/04/2017 9:15 AM   Deanna Gay Aug 15, 1955 [id number]  Referring provider: Kirk Ruths, MD [facility] [place], [place] [address]  Chief Complaint  Patient presents with  . Hematuria    HPI: F/u MH --   MH eval was benign in 2018 -- CT and cystoscopy negative.   She returns and was in hospital for sepsis last mo. Blood and urine cx grew e coli. Her UA today shows no MH. She has moderate bacteriuria but no LUTS. She has CKD and cr was 2.76 - better for her. She has DM. No gross hematuria or flank pain. She had an [place] of her kidneys June 2019 which was benign. Bladder not distended. No dysuria. She is dizzy today. She hasn't eaten today.   PMH: Past Medical History:  Diagnosis Date  . Arthritis   . Cancer ([place])    cervical  . COPD (chronic obstructive pulmonary disease) ([place])   . Depression   . Diabetes mellitus without complication ([place])   . GERD (gastroesophageal reflux disease)   . Hypertension   . Schizophrenia ([place])   . Sleep apnea     Surgical History: Past Surgical History:  Procedure Laterality Date  . APPENDECTOMY    . COLONOSCOPY WITH PROPOFOL N/A 12/25/2014   Procedure: COLONOSCOPY WITH PROPOFOL;  Surgeon: Josefine Class, MD;  Location[facility] ENDOSCOPY;  Service: Endoscopy;  Laterality: N/A;  . ESOPHAGOGASTRODUODENOSCOPY (EGD) WITH PROPOFOL N/A 12/25/2014   Procedure: ESOPHAGOGASTRODUODENOSCOPY (EGD) WITH PROPOFOL;  Surgeon: Josefine Class, MD;  Location: [facility] ENDOSCOPY;  Service: Endoscopy;  Laterality: N/A;  . JOINT REPLACEMENT     right hip  . TOTAL HIP ARTHROPLASTY Left 05/16/2016   Procedure: LEFT TOTAL HIP ARTHROPLASTY ANTERIOR APPROACH;  Surgeon: Mcarthur Rossetti, MD;  Location: [facility] ORS;  Service: Orthopedics;  Laterality: Left;    Home Medications:  Allergies as of 12/04/2017      Reactions   Aspirin Other (See Comments)   Ongoing anemia   Iodine Swelling   Penicillins Swelling   Has  patient had a PCN reaction causing immediate rash, facial/tongue/throat swelling, SOB or lightheadedness with hypotension: Yes Has patient had a PCN reaction causing severe rash involving mucus membranes or skin necrosis: Yes Has patient had a PCN reaction that required hospitalization: No Has patient had a PCN reaction occurring within the last 10 years: No If all of the above answers are "NO", then may proceed with Cephalosporin use.      Medication List        Accurate as of 12/04/17  9:15 AM. Always use your most recent med list.          albuterol 108 (90 Base) MCG/ACT inhaler Commonly known as:  PROVENTIL HFA;VENTOLIN HFA Inhale 2 puffs into the lungs every 4 (four) hours as needed for wheezing or shortness of breath.   aspirin 81 MG chewable tablet Chew 1 tablet (81 mg total) by mouth 2 (two) times daily.   budesonide-formoterol 160-4.5 MCG/ACT inhaler Commonly known as:  SYMBICORT Inhale 2 puffs into the lungs 2 (two) times daily.   diltiazem 180 MG 24 hr capsule Commonly known as:  CARDIZEM CD Take 180 mg by mouth at bedtime.   insulin glargine 100 UNIT/ML injection Commonly known as:  LANTUS Inject 0.1 mLs (10 Units total) into the skin at bedtime.   insulin lispro 100 UNIT/ML injection Commonly known as:  HUMALOG Please check your sugars before each meal and use  lispro per the below sliding scale:  For sugars 151-200: take 2 units For 201-250: take 4units For 251-300: take 6 units For 301-350: take 8 units For 351-400: take 10 units For > 401: call MD   ipratropium-albuterol 0.5-2.5 (3) MG/3ML Soln Commonly known as:  DUONEB Take 3 mLs by nebulization every 6 (six) hours as needed.   iron polysaccharides 150 MG capsule Commonly known as:  IFEREX 150 Take 1 capsule (150 mg total) by mouth 2 (two) times daily.   nicotine 21 mg/24hr patch Commonly known as:  NICODERM CQ - dosed in mg/24 hours Place 1 patch onto the skin daily.   nitroGLYCERIN 0.4 MG SL  tablet Commonly known as:  NITROSTAT Place 1 tablet under the tongue every 5 (five) minutes as needed.   omeprazole 20 MG capsule Commonly known as:  PRILOSEC Take 20 mg by mouth 2 (two) times daily before a meal.   oxyCODONE-acetaminophen 7.5-325 MG tablet Commonly known as:  PERCOCET Take 1-2 tablets by mouth every 6 (six) hours as needed for severe pain.   triamcinolone 0.1 % paste Commonly known as:  KENALOG Use as directed 1 application in the mouth or throat daily.   triamcinolone cream 0.1 % Commonly known as:  KENALOG Apply 1 application topically 2 (two) times daily.       Allergies:  Allergies  Allergen Reactions  . Aspirin Other (See Comments)    Ongoing anemia  . Iodine Swelling  . Penicillins Swelling    Has patient had a PCN reaction causing immediate rash, facial/tongue/throat swelling, SOB or lightheadedness with hypotension: Yes Has patient had a PCN reaction causing severe rash involving mucus membranes or skin necrosis: Yes Has patient had a PCN reaction that required hospitalization: No Has patient had a PCN reaction occurring within the last 10 years: No If all of the above answers are "NO", then may proceed with Cephalosporin use.     Family History: Family History  Problem Relation Age of Onset  . Hypertension Mother   . Diabetes Mellitus II Mother   . Breast cancer Neg Hx   . Kidney cancer Neg Hx   . Bladder Cancer Neg Hx     Social History:  reports that she has been smoking.  She has a 47.00 pack-year smoking history. She has never used smokeless tobacco. She reports that she does not drink alcohol or use drugs.  ROS: UROLOGY Frequent Urination?: No Hard to postpone urination?: No Burning/pain with urination?: No Get up at night to urinate?: No Leakage of urine?: No Urine stream starts and stops?: No Trouble starting stream?: No Do you have to strain to urinate?: No Blood in urine?: No Urinary tract infection?: Yes Sexually  transmitted disease?: No Injury to kidneys or bladder?: No Painful intercourse?: No Weak stream?: No Currently pregnant?: No Vaginal bleeding?: No Last menstrual period?: n  Gastrointestinal Nausea?: No Vomiting?: No Indigestion/heartburn?: No Diarrhea?: No Constipation?: No  Constitutional Fever: No Night sweats?: No Weight loss?: No Fatigue?: No  Skin Skin rash/lesions?: No Itching?: No  Eyes Blurred vision?: No Double vision?: No  Ears/Nose/Throat Sore throat?: No Sinus problems?: No  Hematologic/Lymphatic Swollen glands?: No Easy bruising?: No  Cardiovascular Leg swelling?: No Chest pain?: No  Respiratory Cough?: No Shortness of breath?: Yes  Endocrine Excessive thirst?: No  Musculoskeletal Back pain?: Yes Joint pain?: No  Neurological Headaches?: No Dizziness?: Yes  Psychologic Depression?: No Anxiety?: Yes  Physical Exam: BP 110/67   Pulse (!) 110   Resp 16  Ht 5' 4"  (1.626 m)   Wt 108.9 kg (240 lb)   LMP  (LMP Unknown) Comment: postmenopausal  SpO2 96%   BMI 41.20 kg/m   Constitutional:  Alert and oriented, No acute distress. HEENT: [place] AT, moist mucus membranes.  Trachea midline, no masses. Cardiovascular: No clubbing, cyanosis, or edema. Respiratory: Normal respiratory effort, no increased work of breathing. GI: Abdomen is soft, nontender, nondistended, no abdominal masses GU: No CVA tenderness Skin: No rashes, bruises or suspicious lesions. Neurologic: Grossly intact, no focal deficits, moving all 4 extremities. Psychiatric: Normal mood and affect.  Laboratory Data: Lab Results  Component Value Date   WBC 12.3 (H) 11/18/2017   HGB 9.2 (L) 11/18/2017   HCT 28.4 (L) 11/18/2017   MCV 82.3 11/18/2017   PLT 266 11/18/2017    Lab Results  Component Value Date   CREATININE 2.76 (H) 11/19/2017    No results found for: PSA  No results found for: TESTOSTERONE  Lab Results  Component Value Date   HGBA1C 7.6 (H)  11/11/2017    Urinalysis    Component Value Date/Time   COLORURINE AMBER (A) 11/11/2017 1226   APPEARANCEUR TURBID (A) 11/11/2017 1226   APPEARANCEUR Clear 12/04/2016 0945   LABSPEC 1.017 11/11/2017 1226   LABSPEC 1.005 10/24/2011 1742   PHURINE 5.0 11/11/2017 1226   GLUCOSEU NEGATIVE 11/11/2017 1226   GLUCOSEU Negative 10/24/2011 1742   HGBUR MODERATE (A) 11/11/2017 1226   BILIRUBINUR NEGATIVE 11/11/2017 1226   BILIRUBINUR Negative 12/04/2016 0945   BILIRUBINUR Negative 10/24/2011 1742   KETONESUR NEGATIVE 11/11/2017 1226   PROTEINUR 100 (A) 11/11/2017 1226   NITRITE NEGATIVE 11/11/2017 1226   LEUKOCYTESUR MODERATE (A) 11/11/2017 1226   LEUKOCYTESUR Negative 12/04/2016 0945   LEUKOCYTESUR 3+ 10/24/2011 1742    Lab Results  Component Value Date   LABMICR See below: 12/04/2016   WBCUA None seen 12/04/2016   RBCUA 3-10 (A) 12/04/2016   LABEPIT 0-10 12/04/2016   BACTERIA MANY (A) 11/11/2017    Pertinent Imaging: CT 2018 and [place] 2019 No results found for this or any previous visit. No results found for this or any previous visit. No results found for this or any previous visit. No results found for this or any previous visit. Results for orders placed during the hospital encounter of 11/11/17  US RENAL   Narrative CLINICAL DATA:  Acute kidney injury.  EXAM: RENAL / URINARY TRACT ULTRASOUND COMPLETE  COMPARISON:  CT abdomen pelvis-11/28/2016  FINDINGS: Right Kidney:  Normal cortical thickness, echogenicity and size, measuring 13.8 cm in length. Note is made of a mild fetal lobulation of the right renal cortex. No focal renal lesions. No echogenic renal stones. No urinary obstruction.  Left Kidney:  Evaluation of the left kidney is minimally degraded secondary to overlying bowel gas and poor sonographic window. Normal cortical thickness, echogenicity and size, measuring 12.6 cm in length. No focal renal lesions. No echogenic renal stones. No  urinary obstruction.  Bladder:  Decompressed.  IMPRESSION: No explanation for patient's acute renal insufficiency. Specifically, no evidence of urinary obstruction.   Electronically Signed   By: Sandi Mariscal M.D.   On: 11/11/2017 17:47    No results found for this or any previous visit. Results for orders placed during the hospital encounter of 11/28/16  CT HEMATURIA WORKUP   Narrative CLINICAL DATA:  Hematuria, anterior pelvic pain, status post appendectomy, bilateral hip replacement  EXAM: CT ABDOMEN AND PELVIS WITHOUT AND WITH CONTRAST  TECHNIQUE: Multidetector CT imaging of the  abdomen and pelvis was performed following the standard protocol before and following the bolus administration of intravenous contrast.  CONTRAST:  125 mL Isovue 300 IV  COMPARISON:  01/01/2015  FINDINGS: Lower chest: Lung bases are clear.  Hepatobiliary: Liver is within normal limits, noting a mildly macronodular contour.  Gallbladder is unremarkable. No intrahepatic or extrahepatic duct dilatation.  Pancreas: Within normal limits.  Spleen: Within normal limits.  Adrenals/Urinary Tract: Adrenal glands are within normal limits.  Mild cortical lobulation the kidneys bilaterally. No enhancing renal lesions.  No renal or ureteral calculi.  No hydronephrosis.  On delayed imaging, there are no filling defects in the bilateral opacified proximal collecting systems, ureters, or bladder.  Bladder is partially obscured by streak artifact but is unremarkable.  Stomach/Bowel: Stomach is within normal limits.  No evidence of bowel obstruction.  Prior appendectomy.  Sigmoid diverticulosis without evidence of diverticulitis.  Vascular/Lymphatic: No evidence of abdominal aortic aneurysm.  Atherosclerotic calcifications of the abdominal aorta and branch vessels.  No suspicious abdominopelvic lymphadenopathy.  Reproductive: Uterus is within normal limits.  No adnexal  masses.  Other: No abdominopelvic ascites.  Musculoskeletal: Mild degenerative changes of the visualized thoracolumbar spine.  Bilateral hip arthroplasty, without evidence of complication.  IMPRESSION: No CT findings to account for the patient's hematuria.  No CT findings to account for the patient's anterior pelvic pain.  Status post appendectomy and bilateral hip arthroplasty.  Additional ancillary findings as above.   Electronically Signed   By: Julian Hy M.D.   On: 11/28/2016 11:19    No results found for this or any previous visit.  Assessment & Plan:    1. Microscopic hematuria, CKD --  Benign eval and neg UA today. Normal renal US. See PRN.  - Urinalysis, Complete  2. Bacteriuria - this is asymptomatic and does not need treatment. Despite all abx in June, bacteriruria returns and will likely return. She has no known Urologic predisposition to UTI. Encouraged to keep hydrated and any malaise, fever, chills, dysuria, let us or PCP know about immediately. Will send urine for Cx in the event she gets symptoms. She feels faint and we gave her something to eat and drink and she felt much better.    No follow-ups on file.  Festus Aloe, MD  [facility] [address], [place], [place] [address] [phone]

## 2017-12-07 LAB — CULTURE, URINE COMPREHENSIVE

## 2017-12-10 ENCOUNTER — Other Ambulatory Visit: Payer: Self-pay | Admitting: *Deleted

## 2017-12-10 DIAGNOSIS — D649 Anemia, unspecified: Secondary | ICD-10-CM

## 2017-12-10 NOTE — Progress Notes (Signed)
typ

## 2017-12-11 ENCOUNTER — Inpatient Hospital Stay: Payer: Medicare Other | Attending: Oncology | Admitting: Oncology

## 2017-12-11 ENCOUNTER — Encounter: Payer: Self-pay | Admitting: Oncology

## 2017-12-11 ENCOUNTER — Other Ambulatory Visit: Payer: Self-pay | Admitting: *Deleted

## 2017-12-11 ENCOUNTER — Inpatient Hospital Stay: Payer: Medicare Other

## 2017-12-11 VITALS — BP 109/63 | HR 93 | Temp 98.5°F | Resp 18 | Wt 219.4 lb

## 2017-12-11 DIAGNOSIS — J449 Chronic obstructive pulmonary disease, unspecified: Secondary | ICD-10-CM | POA: Diagnosis not present

## 2017-12-11 DIAGNOSIS — F17210 Nicotine dependence, cigarettes, uncomplicated: Secondary | ICD-10-CM

## 2017-12-11 DIAGNOSIS — Z96642 Presence of left artificial hip joint: Secondary | ICD-10-CM | POA: Diagnosis not present

## 2017-12-11 DIAGNOSIS — D509 Iron deficiency anemia, unspecified: Secondary | ICD-10-CM | POA: Diagnosis present

## 2017-12-11 DIAGNOSIS — D649 Anemia, unspecified: Secondary | ICD-10-CM

## 2017-12-11 DIAGNOSIS — G4730 Sleep apnea, unspecified: Secondary | ICD-10-CM | POA: Insufficient documentation

## 2017-12-11 DIAGNOSIS — M1990 Unspecified osteoarthritis, unspecified site: Secondary | ICD-10-CM | POA: Insufficient documentation

## 2017-12-11 DIAGNOSIS — Z7984 Long term (current) use of oral hypoglycemic drugs: Secondary | ICD-10-CM | POA: Diagnosis not present

## 2017-12-11 DIAGNOSIS — E1165 Type 2 diabetes mellitus with hyperglycemia: Secondary | ICD-10-CM | POA: Insufficient documentation

## 2017-12-11 DIAGNOSIS — Z992 Dependence on renal dialysis: Secondary | ICD-10-CM | POA: Insufficient documentation

## 2017-12-11 DIAGNOSIS — I10 Essential (primary) hypertension: Secondary | ICD-10-CM | POA: Diagnosis not present

## 2017-12-11 DIAGNOSIS — K219 Gastro-esophageal reflux disease without esophagitis: Secondary | ICD-10-CM | POA: Diagnosis not present

## 2017-12-11 DIAGNOSIS — Z79899 Other long term (current) drug therapy: Secondary | ICD-10-CM | POA: Diagnosis not present

## 2017-12-11 LAB — CBC WITH DIFFERENTIAL/PLATELET
BASOS ABS: 0.1 10*3/uL (ref 0–0.1)
BASOS PCT: 1 %
EOS ABS: 0.1 10*3/uL (ref 0–0.7)
Eosinophils Relative: 1 %
HCT: 23.5 % — ABNORMAL LOW (ref 35.0–47.0)
Hemoglobin: 7.4 g/dL — ABNORMAL LOW (ref 12.0–16.0)
Lymphocytes Relative: 16 %
Lymphs Abs: 1.2 10*3/uL (ref 1.0–3.6)
MCH: 24.6 pg — ABNORMAL LOW (ref 26.0–34.0)
MCHC: 31.6 g/dL — AB (ref 32.0–36.0)
MCV: 77.9 fL — ABNORMAL LOW (ref 80.0–100.0)
MONOS PCT: 6 %
Monocytes Absolute: 0.4 10*3/uL (ref 0.2–0.9)
NEUTROS PCT: 76 %
Neutro Abs: 5.7 10*3/uL (ref 1.4–6.5)
Platelets: 423 10*3/uL (ref 150–440)
RBC: 3.02 MIL/uL — AB (ref 3.80–5.20)
RDW: 18 % — ABNORMAL HIGH (ref 11.5–14.5)
WBC: 7.4 10*3/uL (ref 3.6–11.0)

## 2017-12-11 LAB — FERRITIN: FERRITIN: 31 ng/mL (ref 11–307)

## 2017-12-11 LAB — IRON AND TIBC
Iron: 39 ug/dL (ref 28–170)
SATURATION RATIOS: 10 % — AB (ref 10.4–31.8)
TIBC: 388 ug/dL (ref 250–450)
UIBC: 349 ug/dL

## 2017-12-11 LAB — PREPARE RBC (CROSSMATCH)

## 2017-12-11 NOTE — Progress Notes (Signed)
Pt in for follow up and low hemoglobin counts.  Symptomatic, short of breath and very weak.

## 2017-12-12 NOTE — Progress Notes (Signed)
[facility]  [phone]) [phone] [phone]) [phone]  ID: Deanna Gay OB: 1955-07-12  MR#: [id number]  ENI#:[id number]  Patient Care Team: Kirk Ruths, MD as PCP - General (Internal Medicine)  CHIEF COMPLAINT: Iron deficiency anemia.  INTERVAL HISTORY: Patient was last evaluated in clinic in February 2019.  She was recently admitted to the hospital with urinary tract infection and was found to have a declining hemoglobin.  Upon evaluation by her primary care hemoglobin dropped to less than 7.0.  She feels significantly weak and fatigued, but otherwise feels well. She has no neurologic complaints.  She has no chest pain or shortness of breath. She denies any nausea, vomiting, constipation, or diarrhea. She has no melena or hematochezia.  She has no urinary complaints. Patient offers no further specific complaints today.    REVIEW OF SYSTEMS:   Review of Systems  Constitutional: Positive for malaise/fatigue. Negative for weight loss.  Respiratory: Negative.  Negative for cough, hemoptysis and shortness of breath.   Cardiovascular: Negative.  Negative for chest pain and leg swelling.  Gastrointestinal: Negative for abdominal pain, blood in stool and melena.  Genitourinary: Negative.  Negative for frequency and hematuria.  Musculoskeletal: Negative.  Negative for myalgias.  Skin: Negative.  Negative for rash.  Neurological: Positive for weakness. Negative for focal weakness and headaches.  Psychiatric/Behavioral: The patient is not nervous/anxious.     As per HPI. Otherwise, a complete review of systems is negative.  PAST MEDICAL HISTORY: Past Medical History:  Diagnosis Date  . Arthritis   . Cancer ([place])    cervical  . COPD (chronic obstructive pulmonary disease) ([place])   . Depression   . Diabetes mellitus without complication ([place])   . GERD (gastroesophageal reflux disease)   . Hypertension   . Schizophrenia ([place])   . Sleep apnea     PAST  SURGICAL HISTORY: Past Surgical History:  Procedure Laterality Date  . APPENDECTOMY    . COLONOSCOPY WITH PROPOFOL N/A 12/25/2014   Procedure: COLONOSCOPY WITH PROPOFOL;  Surgeon: Josefine Class, MD;  Location: [facility] ENDOSCOPY;  Service: Endoscopy;  Laterality: N/A;  . ESOPHAGOGASTRODUODENOSCOPY (EGD) WITH PROPOFOL N/A 12/25/2014   Procedure: ESOPHAGOGASTRODUODENOSCOPY (EGD) WITH PROPOFOL;  Surgeon: Josefine Class, MD;  Location: [facility] ENDOSCOPY;  Service: Endoscopy;  Laterality: N/A;  . JOINT REPLACEMENT     right hip  . TOTAL HIP ARTHROPLASTY Left 05/16/2016   Procedure: LEFT TOTAL HIP ARTHROPLASTY ANTERIOR APPROACH;  Surgeon: Mcarthur Rossetti, MD;  Location: [facility] ORS;  Service: Orthopedics;  Laterality: Left;    FAMILY HISTORY Family History  Problem Relation Age of Onset  . Hypertension Mother   . Diabetes Mellitus II Mother   . Breast cancer Neg Hx   . Kidney cancer Neg Hx   . Bladder Cancer Neg Hx        ADVANCED DIRECTIVES:    HEALTH MAINTENANCE: Social History   Tobacco Use  . Smoking status: Current Every Day Smoker    Packs/day: 1.00    Years: 47.00    Pack years: 47.00  . Smokeless tobacco: Never Used  Substance Use Topics  . Alcohol use: No  . Drug use: No     Colonoscopy:  PAP:  Bone density:  Lipid panel:  Allergies  Allergen Reactions  . Aspirin Other (See Comments)    Ongoing anemia  . Iodine Swelling  . Penicillins Swelling    Has patient had a PCN reaction causing immediate rash, facial/tongue/throat swelling, SOB or lightheadedness with hypotension:  Yes Has patient had a PCN reaction causing severe rash involving mucus membranes or skin necrosis: Yes Has patient had a PCN reaction that required hospitalization: No Has patient had a PCN reaction occurring within the last 10 years: No If all of the above answers are "NO", then may proceed with Cephalosporin use.     Current Outpatient Medications  Medication Sig Dispense Refill    . albuterol (PROVENTIL HFA;VENTOLIN HFA) 108 (90 BASE) MCG/ACT inhaler Inhale 2 puffs into the lungs every 4 (four) hours as needed for wheezing or shortness of breath.     Marland Kitchen aspirin 81 MG chewable tablet Chew 1 tablet (81 mg total) by mouth 2 (two) times daily. (Patient taking differently: Chew 81 mg by mouth daily. ) 60 tablet 0  . budesonide-formoterol (SYMBICORT) 160-4.5 MCG/ACT inhaler Inhale 2 puffs into the lungs 2 (two) times daily.    Marland Kitchen diltiazem (CARDIZEM CD) 180 MG 24 hr capsule Take 180 mg by mouth at bedtime.     . insulin glargine (LANTUS) 100 UNIT/ML injection Inject 0.1 mLs (10 Units total) into the skin at bedtime. 10 mL 11  . insulin lispro (HUMALOG) 100 UNIT/ML injection Please check your sugars before each meal and use lispro per the below sliding scale:  For sugars 151-200: take 2 units For 201-250: take 4units For 251-300: take 6 units For 301-350: take 8 units For 351-400: take 10 units For > 401: call MD 10 mL 11  . ipratropium-albuterol (DUONEB) 0.5-2.5 (3) MG/3ML SOLN Take 3 mLs by nebulization every 6 (six) hours as needed.    . iron polysaccharides (IFEREX 150) 150 MG capsule Take 1 capsule (150 mg total) by mouth 2 (two) times daily. 200 capsule 1  . nitroGLYCERIN (NITROSTAT) 0.4 MG SL tablet Place 1 tablet under the tongue every 5 (five) minutes as needed.  0  . omeprazole (PRILOSEC) 20 MG capsule Take 20 mg by mouth 2 (two) times daily before a meal.   0  . oxyCODONE-acetaminophen (PERCOCET) 7.5-325 MG tablet Take 1-2 tablets by mouth every 6 (six) hours as needed for severe pain. 30 tablet 0  . nicotine (NICODERM CQ - DOSED IN MG/24 HOURS) 21 mg/24hr patch Place 1 patch onto the skin daily.  0  . triamcinolone (KENALOG) 0.1 % paste Use as directed 1 application in the mouth or throat daily.  1  . triamcinolone cream (KENALOG) 0.1 % Apply 1 application topically 2 (two) times daily.     No current facility-administered medications for this visit.      OBJECTIVE: Vitals:   12/11/17 0947  BP: 109/63  Pulse: 93  Resp: 18  Temp: 98.5 F (36.9 C)  SpO2: 100%     Body mass index is 37.65 kg/m.    ECOG FS:0 - Asymptomatic  General: Well-developed, well-nourished, no acute distress. Eyes: Pink conjunctiva, anicteric sclera. HEENT: Normocephalic, moist mucous membranes, clear oropharnyx. Lungs: Clear to auscultation bilaterally. Heart: Regular rate and rhythm. No rubs, murmurs, or gallops. Abdomen: Soft, nontender, nondistended. No organomegaly noted, normoactive bowel sounds. Musculoskeletal: No edema, cyanosis, or clubbing. Neuro: Alert, answering all questions appropriately. Cranial nerves grossly intact. Skin: No rashes or petechiae noted. Psych: Normal affect.  LAB RESULTS:  Lab Results  Component Value Date   NA 144 11/19/2017   K 4.8 11/19/2017   CL 104 11/19/2017   CO2 30 11/19/2017   GLUCOSE 108 (H) 11/19/2017   BUN 61 (H) 11/19/2017   CREATININE 2.76 (H) 11/19/2017   CALCIUM 7.6 (L) 11/19/2017  PROT 5.9 (L) 11/15/2017   ALBUMIN 1.9 (L) 11/18/2017   AST 12 (L) 11/15/2017   ALT 13 11/15/2017   ALKPHOS 71 11/15/2017   BILITOT 0.3 11/15/2017   GFRNONAA 17 (L) 11/19/2017   GFRAA 20 (L) 11/19/2017    Lab Results  Component Value Date   WBC 7.4 12/11/2017   NEUTROABS 5.7 12/11/2017   HGB 7.4 (L) 12/11/2017   HCT 23.5 (L) 12/11/2017   MCV 77.9 (L) 12/11/2017   PLT 423 12/11/2017   Lab Results  Component Value Date   FERRITIN 31 12/11/2017   Lab Results  Component Value Date   IRON 39 12/11/2017   TIBC 388 12/11/2017   IRONPCTSAT 10 (L) 12/11/2017      STUDIES: Dg Chest Port 1 View  Result Date: 11/16/2017 CLINICAL DATA:  Status post dialysis catheter placement. EXAM: PORTABLE CHEST 1 VIEW COMPARISON:  Portable chest x-ray of November 15, 2017 FINDINGS: The dialysis catheter tip projects just proximal to the cavoatrial junction. There is no postprocedure pneumothorax or hemothorax. The cardiac  silhouette is enlarged. The pulmonary vascularity is mildly prominent centrally. There is stable subsegmental atelectasis in the right mid and lower lung. There is no pleural effusion. There is tortuosity of the ascending and descending thoracic aorta. IMPRESSION: The hemodialysis catheter tip projects just proximal to the right cavoatrial junction. Mild mid and lower right lung subsegmental atelectasis. Stable cardiomegaly without pulmonary edema. Thoracic aortic atherosclerosis. Electronically Signed   By: David  [place]ique M.D.   On: 11/16/2017 13:38   Dg Chest Port 1 View  Result Date: 11/15/2017 CLINICAL DATA:  Acute onset of hypoxia EXAM: PORTABLE CHEST 1 VIEW COMPARISON:  11/11/2017, 07/19/2017 FINDINGS: Rotated patient exaggerating the cardiomediastinal silhouette and resulting in widened appearance of mediastinum. Cardiomegaly. Small right-sided pleural effusion. Subsegmental atelectasis at the right base. No pneumothorax. IMPRESSION: 1. Patient rotation exaggerates the cardiomediastinal silhouette 2. Cardiomegaly with suspected small right pleural effusion. Subsegmental atelectasis at the right base. Electronically Signed   By: Donavan Foil M.D.   On: 11/15/2017 22:25    ASSESSMENT: Iron deficiency anemia.  PLAN:    1. Iron deficiency anemia: Patient's hemoglobin has significantly declined and she has reduced iron stores as well.  Previously, the remainder of her blood work was either negative or within normal limits.  Unclear etiology of patient's acute drop, but is likely GI in origin.  She has not had a colonoscopy or upper endoscopy since August 2016.  Patient will return to clinic on Monday, December 14, 2017 and received 1 unit of packed red blood cells.  She will then return to clinic on Thursday for laboratory work and on Friday for further evaluation and consideration of additional blood, or possibly only [place].   2. Hyperglycemia: Continue insulin and diabetic medications as  prescribed.  I spent a total of 30 minutes face-to-face with the patient of which greater than 50% of the visit was spent in counseling and coordination of care as detailed above.   Patient expressed understanding and was in agreement with this plan. She also understands that She can call clinic at any time with any questions, concerns, or complaints.    Lloyd Huger, MD   12/12/2017 11:19 PM

## 2017-12-14 ENCOUNTER — Inpatient Hospital Stay: Payer: Medicare Other

## 2017-12-14 DIAGNOSIS — D649 Anemia, unspecified: Secondary | ICD-10-CM

## 2017-12-14 DIAGNOSIS — D509 Iron deficiency anemia, unspecified: Secondary | ICD-10-CM | POA: Diagnosis not present

## 2017-12-14 MED ORDER — DIPHENHYDRAMINE HCL 25 MG PO CAPS
25.0000 mg | ORAL_CAPSULE | Freq: Once | ORAL | Status: AC
  Administered 2017-12-14: 25 mg via ORAL

## 2017-12-14 MED ORDER — DIPHENHYDRAMINE HCL 50 MG/ML IJ SOLN: 25.0000 mg | Freq: Once | INTRAMUSCULAR | Status: DC

## 2017-12-14 MED ORDER — SODIUM CHLORIDE 0.9% IV SOLUTION
250.0000 mL | Freq: Once | INTRAVENOUS | Status: AC
  Administered 2017-12-14: 250 mL via INTRAVENOUS
  Filled 2017-12-14: qty 250

## 2017-12-14 MED ORDER — ACETAMINOPHEN 325 MG PO TABS
650.0000 mg | ORAL_TABLET | Freq: Once | ORAL | Status: AC
  Administered 2017-12-14: 650 mg via ORAL

## 2017-12-14 NOTE — Patient Instructions (Signed)

## 2017-12-15 LAB — BPAM RBC
BLOOD PRODUCT EXPIRATION DATE: 201908182359
ISSUE DATE / TIME: 201907291147
Unit Type and Rh: 5100

## 2017-12-15 LAB — TYPE AND SCREEN
ABO/RH(D): O POS
ANTIBODY SCREEN: NEGATIVE
UNIT DIVISION: 0

## 2017-12-17 ENCOUNTER — Other Ambulatory Visit: Payer: Self-pay | Admitting: Oncology

## 2017-12-17 ENCOUNTER — Inpatient Hospital Stay: Payer: Medicare Other | Attending: Oncology

## 2017-12-17 ENCOUNTER — Other Ambulatory Visit: Payer: Self-pay | Admitting: *Deleted

## 2017-12-17 DIAGNOSIS — D509 Iron deficiency anemia, unspecified: Secondary | ICD-10-CM | POA: Diagnosis not present

## 2017-12-17 DIAGNOSIS — D649 Anemia, unspecified: Secondary | ICD-10-CM

## 2017-12-17 DIAGNOSIS — Z794 Long term (current) use of insulin: Secondary | ICD-10-CM | POA: Diagnosis not present

## 2017-12-17 DIAGNOSIS — N189 Chronic kidney disease, unspecified: Secondary | ICD-10-CM | POA: Insufficient documentation

## 2017-12-17 DIAGNOSIS — E1365 Other specified diabetes mellitus with hyperglycemia: Secondary | ICD-10-CM | POA: Insufficient documentation

## 2017-12-17 DIAGNOSIS — Z79899 Other long term (current) drug therapy: Secondary | ICD-10-CM | POA: Insufficient documentation

## 2017-12-17 LAB — CBC WITH DIFFERENTIAL/PLATELET
BASOS ABS: 0 10*3/uL (ref 0–0.1)
BASOS PCT: 0 %
Eosinophils Absolute: 0 10*3/uL (ref 0–0.7)
Eosinophils Relative: 0 %
HEMATOCRIT: 24 % — AB (ref 35.0–47.0)
HEMOGLOBIN: 7.6 g/dL — AB (ref 12.0–16.0)
Lymphocytes Relative: 18 %
Lymphs Abs: 1.5 10*3/uL (ref 1.0–3.6)
MCH: 25.2 pg — ABNORMAL LOW (ref 26.0–34.0)
MCHC: 31.5 g/dL — AB (ref 32.0–36.0)
MCV: 80 fL (ref 80.0–100.0)
Monocytes Absolute: 0.5 10*3/uL (ref 0.2–0.9)
Monocytes Relative: 6 %
NEUTROS PCT: 76 %
Neutro Abs: 6.3 10*3/uL (ref 1.4–6.5)
Platelets: 305 10*3/uL (ref 150–440)
RBC: 3 MIL/uL — AB (ref 3.80–5.20)
RDW: 20.1 % — ABNORMAL HIGH (ref 11.5–14.5)
WBC: 8.4 10*3/uL (ref 3.6–11.0)

## 2017-12-17 LAB — LACTATE DEHYDROGENASE: LDH: 125 U/L (ref 98–192)

## 2017-12-17 LAB — PREPARE RBC (CROSSMATCH)

## 2017-12-17 LAB — FOLATE: FOLATE: 5 ng/mL — AB (ref >5.9)

## 2017-12-17 LAB — SAMPLE TO BLOOD BANK

## 2017-12-17 LAB — VITAMIN B12: VITAMIN B 12: 298 pg/mL (ref 180–914)

## 2017-12-17 NOTE — Progress Notes (Signed)
Orders placed for blood. Transfuse 2 units please.  Faythe Casa, NP 12/17/2017 11:24 AM

## 2017-12-18 ENCOUNTER — Inpatient Hospital Stay: Payer: Medicare Other

## 2017-12-18 ENCOUNTER — Inpatient Hospital Stay (HOSPITAL_BASED_OUTPATIENT_CLINIC_OR_DEPARTMENT_OTHER): Payer: Medicare Other | Admitting: Oncology

## 2017-12-18 ENCOUNTER — Encounter: Payer: Self-pay | Admitting: Oncology

## 2017-12-18 VITALS — BP 135/61 | HR 89 | Temp 97.8°F | Resp 20 | Wt 218.0 lb

## 2017-12-18 DIAGNOSIS — E1365 Other specified diabetes mellitus with hyperglycemia: Secondary | ICD-10-CM

## 2017-12-18 DIAGNOSIS — Z794 Long term (current) use of insulin: Secondary | ICD-10-CM

## 2017-12-18 DIAGNOSIS — Z79899 Other long term (current) drug therapy: Secondary | ICD-10-CM | POA: Diagnosis not present

## 2017-12-18 DIAGNOSIS — D649 Anemia, unspecified: Secondary | ICD-10-CM

## 2017-12-18 DIAGNOSIS — D509 Iron deficiency anemia, unspecified: Secondary | ICD-10-CM

## 2017-12-18 LAB — HAPTOGLOBIN: HAPTOGLOBIN: 422 mg/dL — AB (ref 34–200)

## 2017-12-18 MED ORDER — DIPHENHYDRAMINE HCL 25 MG PO CAPS
25.0000 mg | ORAL_CAPSULE | Freq: Once | ORAL | Status: AC
  Administered 2017-12-18: 25 mg via ORAL
  Filled 2017-12-18: qty 1

## 2017-12-18 MED ORDER — SODIUM CHLORIDE 0.9% IV SOLUTION
250.0000 mL | Freq: Once | INTRAVENOUS | Status: AC
  Administered 2017-12-18: 250 mL via INTRAVENOUS
  Filled 2017-12-18: qty 250

## 2017-12-18 MED ORDER — ACETAMINOPHEN 325 MG PO TABS
650.0000 mg | ORAL_TABLET | Freq: Once | ORAL | Status: AC
  Administered 2017-12-18: 650 mg via ORAL
  Filled 2017-12-18: qty 2

## 2017-12-18 NOTE — Progress Notes (Signed)
UNMATCHED BLOOD PRODUCT NOTE  Compare the patient ID on the blood tag to the patient ID on the hospital armband and Blood Bank armband. Then confirm the unit number on the blood tag matches the unit number on the blood product.  If a discrepancy is discovered return the product to blood bank immediately.   Blood Product Type: Packed Red Blood Cells  Unit #: (Found on blood product bag, begins with W) [id number]  Product Code #: (Found on blood product bag, begins with E) [id number]   Start Time: 1200  Starting Rate: 120 ml/hr  Rate increase/decreased  (if applicable):    225  ml/hr  Rate changed time (if applicable): 6720   Stop Time: 9198   All Other Documentation should be documented within the Blood Admin Flowsheet per policy.

## 2017-12-18 NOTE — Progress Notes (Signed)
Pt in for follow up and 2 units of blood today, had 1 unit on Monday, states "I had a good day Tuesday but fell out in the [place] on Wednesday:  Reports being very weak and fatigued today.

## 2017-12-18 NOTE — Progress Notes (Signed)
[place]  [phone] Fax:(336) [phone]  ID: Deanna Gay OB: 1956/04/06  MR#: [id number]  IEP#:[id number]  Patient Care Team: Deanna Ruths, MD as PCP - General (Internal Medicine)  CHIEF COMPLAINT: Iron deficiency anemia.  INTERVAL HISTORY: Patient was seen last week by primary hematologist Dr. Grayland Gay on 12/11/2017 for evaluation of a low hemoglobin level. She had recently been admitted to the hospital d/t a UTI and was found to have a declining hemoglobin.  She admitted to feeling weak and fatigued but otherwise felt well.  Since then, she has received 1 unit packed red blood cells on Monday without significant increase of her hemoglobin.  Today her hemoglobin is 7.6.  Previously 7.4.  She continues to be very weak and fatigued with intermittent shortness of breath.  She denies any neurological complaints, chest pain, nausea, vomiting, constipation or diarrhea.  She denies any melena or hematochezia. Denies urinary complaints.   REVIEW OF SYSTEMS:   Review of Systems  Constitutional: Positive for malaise/fatigue. Negative for chills, fever and weight loss.  HENT: Negative for congestion and ear pain.   Eyes: Negative.  Negative for blurred vision and double vision.  Respiratory: Positive for shortness of breath and wheezing. Negative for cough and sputum production.   Cardiovascular: Negative.  Negative for chest pain, palpitations and leg swelling.  Gastrointestinal: Negative.  Negative for abdominal pain, constipation, diarrhea, nausea and vomiting.  Genitourinary: Negative for dysuria, frequency and urgency.  Musculoskeletal: Negative for back pain and falls.  Skin: Negative.  Negative for rash.  Neurological: Positive for dizziness and weakness. Negative for headaches.  Endo/Heme/Allergies: Negative.  Does not bruise/bleed easily.  Psychiatric/Behavioral: Negative.  Negative for depression. The patient is not nervous/anxious and does  not have insomnia.     As per HPI. Otherwise, a complete review of systems is negative.  PAST MEDICAL HISTORY: Past Medical History:  Diagnosis Date  . Arthritis   . Cancer ([place])    cervical  . COPD (chronic obstructive pulmonary disease) ([place])   . Depression   . Diabetes mellitus without complication ([place])   . GERD (gastroesophageal reflux disease)   . Hypertension   . Schizophrenia ([place])   . Sleep apnea     PAST SURGICAL HISTORY: Past Surgical History:  Procedure Laterality Date  . APPENDECTOMY    . COLONOSCOPY WITH PROPOFOL N/A 12/25/2014   Procedure: COLONOSCOPY WITH PROPOFOL;  Surgeon: Josefine Class, MD;  Location: [facility] ENDOSCOPY;  Service: Endoscopy;  Laterality: N/A;  . ESOPHAGOGASTRODUODENOSCOPY (EGD) WITH PROPOFOL N/A 12/25/2014   Procedure: ESOPHAGOGASTRODUODENOSCOPY (EGD) WITH PROPOFOL;  Surgeon: Josefine Class, MD;  Location[facility] ENDOSCOPY;  Service: Endoscopy;  Laterality: N/A;  . JOINT REPLACEMENT     right hip  . TOTAL HIP ARTHROPLASTY Left 05/16/2016   Procedure: LEFT TOTAL HIP ARTHROPLASTY ANTERIOR APPROACH;  Surgeon: Mcarthur Rossetti, MD;  Location: [facility] ORS;  Service: Orthopedics;  Laterality: Left;    FAMILY HISTORY Family History  Problem Relation Age of Onset  . Hypertension Mother   . Diabetes Mellitus II Mother   . Breast cancer Neg Hx   . Kidney cancer Neg Hx   . Bladder Cancer Neg Hx        ADVANCED DIRECTIVES:    HEALTH MAINTENANCE: Social History   Tobacco Use  . Smoking status: Current Every Day Smoker    Packs/day: 1.00    Years: 47.00    Pack years: 47.00  . Smokeless tobacco: Never Used  Substance Use Topics  .  Alcohol use: No  . Drug use: No     Colonoscopy:  PAP:  Bone density:  Lipid panel:  Allergies  Allergen Reactions  . Aspirin Other (See Comments)    Ongoing anemia  . Iodine Swelling  . Penicillins Swelling    Has patient had a PCN reaction causing immediate rash, facial/tongue/throat  swelling, SOB or lightheadedness with hypotension: Yes Has patient had a PCN reaction causing severe rash involving mucus membranes or skin necrosis: Yes Has patient had a PCN reaction that required hospitalization: No Has patient had a PCN reaction occurring within the last 10 years: No If all of the above answers are "NO", then may proceed with Cephalosporin use.     Current Outpatient Medications  Medication Sig Dispense Refill  . albuterol (PROVENTIL HFA;VENTOLIN HFA) 108 (90 BASE) MCG/ACT inhaler Inhale 2 puffs into the lungs every 4 (four) hours as needed for wheezing or shortness of breath.     Marland Kitchen aspirin 81 MG chewable tablet Chew 1 tablet (81 mg total) by mouth 2 (two) times daily. (Patient taking differently: Chew 81 mg by mouth daily. ) 60 tablet 0  . budesonide-formoterol (SYMBICORT) 160-4.5 MCG/ACT inhaler Inhale 2 puffs into the lungs 2 (two) times daily.    Marland Kitchen diltiazem (CARDIZEM CD) 180 MG 24 hr capsule Take 180 mg by mouth at bedtime.     . insulin glargine (LANTUS) 100 UNIT/ML injection Inject 0.1 mLs (10 Units total) into the skin at bedtime. 10 mL 11  . insulin lispro (HUMALOG) 100 UNIT/ML injection Please check your sugars before each meal and use lispro per the below sliding scale:  For sugars 151-200: take 2 units For 201-250: take 4units For 251-300: take 6 units For 301-350: take 8 units For 351-400: take 10 units For > 401: call MD 10 mL 11  . ipratropium-albuterol (DUONEB) 0.5-2.5 (3) MG/3ML SOLN Take 3 mLs by nebulization every 6 (six) hours as needed.    . iron polysaccharides (IFEREX 150) 150 MG capsule Take 1 capsule (150 mg total) by mouth 2 (two) times daily. 200 capsule 1  . nicotine (NICODERM CQ - DOSED IN MG/24 HOURS) 21 mg/24hr patch Place 1 patch onto the skin daily.  0  . nitroGLYCERIN (NITROSTAT) 0.4 MG SL tablet Place 1 tablet under the tongue every 5 (five) minutes as needed.  0  . omeprazole (PRILOSEC) 20 MG capsule Take 20 mg by mouth 2 (two) times  daily before a meal.   0  . oxyCODONE-acetaminophen (PERCOCET) 7.5-325 MG tablet Take 1-2 tablets by mouth every 6 (six) hours as needed for severe pain. 30 tablet 0  . triamcinolone (KENALOG) 0.1 % paste Use as directed 1 application in the mouth or throat daily.  1  . triamcinolone cream (KENALOG) 0.1 % Apply 1 application topically 2 (two) times daily.     No current facility-administered medications for this visit.     OBJECTIVE: There were no vitals filed for this visit.   There is no height or weight on file to calculate BMI.    ECOG FS:0 - Asymptomatic  Physical Exam  Constitutional: She is oriented to person, place, and time. Vital signs are normal. She appears well-developed and well-nourished.  Obese   HENT:  Head: Normocephalic and atraumatic.  Eyes: Pupils are equal, round, and reactive to light.  Neck: Normal range of motion.  Cardiovascular: Normal rate, regular rhythm and normal heart sounds.  No murmur heard. Pulmonary/Chest: Effort normal and breath sounds normal. She has no  wheezes.  Abdominal: Soft. Normal appearance and bowel sounds are normal. She exhibits no distension. There is no tenderness.  Musculoskeletal: Normal range of motion. She exhibits no edema.  Neurological: She is alert and oriented to person, place, and time.  Skin: Skin is warm and dry. No rash noted.  Psychiatric: Judgment normal.    LAB RESULTS:  Lab Results  Component Value Date   NA 144 11/19/2017   K 4.8 11/19/2017   CL 104 11/19/2017   CO2 30 11/19/2017   GLUCOSE 108 (H) 11/19/2017   BUN 61 (H) 11/19/2017   CREATININE 2.76 (H) 11/19/2017   CALCIUM 7.6 (L) 11/19/2017   PROT 5.9 (L) 11/15/2017   ALBUMIN 1.9 (L) 11/18/2017   AST 12 (L) 11/15/2017   ALT 13 11/15/2017   ALKPHOS 71 11/15/2017   BILITOT 0.3 11/15/2017   GFRNONAA 17 (L) 11/19/2017   GFRAA 20 (L) 11/19/2017    Lab Results  Component Value Date   WBC 8.4 12/17/2017   NEUTROABS 6.3 12/17/2017   HGB 7.6 (L)  12/17/2017   HCT 24.0 (L) 12/17/2017   MCV 80.0 12/17/2017   PLT 305 12/17/2017   Lab Results  Component Value Date   FERRITIN 31 12/11/2017   Lab Results  Component Value Date   IRON 39 12/11/2017   TIBC 388 12/11/2017   IRONPCTSAT 10 (L) 12/11/2017      STUDIES: No results found.  ASSESSMENT: Iron deficiency anemia.  PLAN:    1. Iron deficiency anemia: Significant decline in hemoglobin.  Thought to be likely GI in origin.  Received 1 unit PRBCs on Monday with little to no change in hemoglobin.  Today hemoglobin 7.6.  Has not had a colonoscopy or upper endoscopy since August 2016.  Will transfuse 2 units PRBCs today and have her return to clinic in 1 week with labs, MD assessment and blood transfusions.  Will put referral in for GI ASAP to evaluate. 2. Hx of cervical dysplasia: diagnosed in 1972 per patient. Screening program until 2004 in [place]. Patient states she has not had an evaluation in over 10 years.  Would like to reestablish care with a gynecologist.  Will place referral today. 3.  Hyperglycemia: Continue insulin diabetic medications as prescribed  Greater than 50% was spent in counseling and coordination of care with this patient including but not limited to discussion of the relevant topics above (See A&P) including, but not limited to diagnosis and management of acute and chronic medical conditions.   Patient expressed understanding and was in agreement with this plan. She also understands that She can call clinic at any time with any questions, concerns, or complaints.    Jacquelin Hawking, NP   12/18/2017 8:47 AM

## 2017-12-18 NOTE — Patient Instructions (Signed)

## 2017-12-19 LAB — TYPE AND SCREEN
ABO/RH(D): O POS
Antibody Screen: NEGATIVE
Unit division: 0
Unit division: 0

## 2017-12-19 LAB — BPAM RBC
Blood Product Expiration Date: 201908272359
Blood Product Expiration Date: 201908272359
ISSUE DATE / TIME: 201908020938
ISSUE DATE / TIME: 201908021153
Unit Type and Rh: 5100
Unit Type and Rh: 5100

## 2017-12-21 ENCOUNTER — Other Ambulatory Visit: Payer: Self-pay | Admitting: Oncology

## 2017-12-21 DIAGNOSIS — D649 Anemia, unspecified: Secondary | ICD-10-CM

## 2017-12-21 NOTE — Progress Notes (Deleted)
[place]  Telephone:(336) [phone] Fax:(336) [phone]  ID: Deanna Gay OB: 09/30/1955  MR#: [id number]  ZRA#:[id number]  Patient Care Team: Kirk Ruths, MD as PCP - General (Internal Medicine)  CHIEF COMPLAINT: Iron deficiency anemia.  INTERVAL HISTORY: Patient was last evaluated in clinic in February 2019.  She was recently admitted to the hospital with urinary tract infection and was found to have a declining hemoglobin.  Upon evaluation by her primary care hemoglobin dropped to less than 7.0.  She feels significantly weak and fatigued, but otherwise feels well. She has no neurologic complaints.  She has no chest pain or shortness of breath. She denies any nausea, vomiting, constipation, or diarrhea. She has no melena or hematochezia.  She has no urinary complaints. Patient offers no further specific complaints today.    REVIEW OF SYSTEMS:   Review of Systems  Constitutional: Positive for malaise/fatigue. Negative for weight loss.  Respiratory: Negative.  Negative for cough, hemoptysis and shortness of breath.   Cardiovascular: Negative.  Negative for chest pain and leg swelling.  Gastrointestinal: Negative for abdominal pain, blood in stool and melena.  Genitourinary: Negative.  Negative for frequency and hematuria.  Musculoskeletal: Negative.  Negative for myalgias.  Skin: Negative.  Negative for rash.  Neurological: Positive for weakness. Negative for focal weakness and headaches.  Psychiatric/Behavioral: The patient is not nervous/anxious.     As per HPI. Otherwise, a complete review of systems is negative.  PAST MEDICAL HISTORY: Past Medical History:  Diagnosis Date  . Arthritis   . Cancer ([place])    cervical  . COPD (chronic obstructive pulmonary disease) ([place])   . Depression   . Diabetes mellitus without complication ([place])   . GERD (gastroesophageal reflux disease)   . Hypertension   . Schizophrenia ([place])   . Sleep apnea     PAST  SURGICAL HISTORY: Past Surgical History:  Procedure Laterality Date  . APPENDECTOMY    . COLONOSCOPY WITH PROPOFOL N/A 12/25/2014   Procedure: COLONOSCOPY WITH PROPOFOL;  Surgeon: Josefine Class, MD;  [facility] ENDOSCOPY;  Service: Endoscopy;  Laterality: N/A;  . ESOPHAGOGASTRODUODENOSCOPY (EGD) WITH PROPOFOL N/A 12/25/2014   Procedure: ESOPHAGOGASTRODUODENOSCOPY (EGD) WITH PROPOFOL;  Surgeon: Josefine Class, MD;  Location: [facility] ENDOSCOPY;  Service: Endoscopy;  Laterality: N/A;  . JOINT REPLACEMENT     right hip  . TOTAL HIP ARTHROPLASTY Left 05/16/2016   Procedure: LEFT TOTAL HIP ARTHROPLASTY ANTERIOR APPROACH;  Surgeon: Mcarthur Rossetti, MD;  Location: [facility] ORS;  Service: Orthopedics;  Laterality: Left;    FAMILY HISTORY Family History  Problem Relation Age of Onset  . Hypertension Mother   . Diabetes Mellitus II Mother   . Breast cancer Neg Hx   . Kidney cancer Neg Hx   . Bladder Cancer Neg Hx        ADVANCED DIRECTIVES:    HEALTH MAINTENANCE: Social History   Tobacco Use  . Smoking status: Current Every Day Smoker    Packs/day: 1.00    Years: 47.00    Pack years: 47.00  . Smokeless tobacco: Never Used  Substance Use Topics  . Alcohol use: No  . Drug use: No     Colonoscopy:  PAP:  Bone density:  Lipid panel:  Allergies  Allergen Reactions  . Aspirin Other (See Comments)    Ongoing anemia  . Iodine Swelling  . Penicillins Swelling    Has patient had a PCN reaction causing immediate rash, facial/tongue/throat swelling, SOB or lightheadedness with hypotension:  Yes Has patient had a PCN reaction causing severe rash involving mucus membranes or skin necrosis: Yes Has patient had a PCN reaction that required hospitalization: No Has patient had a PCN reaction occurring within the last 10 years: No If all of the above answers are "NO", then may proceed with Cephalosporin use.     Current Outpatient Medications  Medication Sig Dispense Refill    . albuterol (PROVENTIL HFA;VENTOLIN HFA) 108 (90 BASE) MCG/ACT inhaler Inhale 2 puffs into the lungs every 4 (four) hours as needed for wheezing or shortness of breath.     Marland Kitchen aspirin 81 MG chewable tablet Chew 1 tablet (81 mg total) by mouth 2 (two) times daily. (Patient taking differently: Chew 81 mg by mouth daily. ) 60 tablet 0  . budesonide-formoterol (SYMBICORT) 160-4.5 MCG/ACT inhaler Inhale 2 puffs into the lungs 2 (two) times daily.    Marland Kitchen diltiazem (CARDIZEM CD) 180 MG 24 hr capsule Take 180 mg by mouth at bedtime.     . insulin glargine (LANTUS) 100 UNIT/ML injection Inject 0.1 mLs (10 Units total) into the skin at bedtime. 10 mL 11  . insulin lispro (HUMALOG) 100 UNIT/ML injection Please check your sugars before each meal and use lispro per the below sliding scale:  For sugars 151-200: take 2 units For 201-250: take 4units For 251-300: take 6 units For 301-350: take 8 units For 351-400: take 10 units For > 401: call MD 10 mL 11  . ipratropium-albuterol (DUONEB) 0.5-2.5 (3) MG/3ML SOLN Take 3 mLs by nebulization every 6 (six) hours as needed.    . iron polysaccharides (IFEREX 150) 150 MG capsule Take 1 capsule (150 mg total) by mouth 2 (two) times daily. 200 capsule 1  . nicotine (NICODERM CQ - DOSED IN MG/24 HOURS) 21 mg/24hr patch Place 1 patch onto the skin daily.  0  . nitroGLYCERIN (NITROSTAT) 0.4 MG SL tablet Place 1 tablet under the tongue every 5 (five) minutes as needed.  0  . omeprazole (PRILOSEC) 20 MG capsule Take 20 mg by mouth 2 (two) times daily before a meal.   0  . oxyCODONE-acetaminophen (PERCOCET) 7.5-325 MG tablet Take 1-2 tablets by mouth every 6 (six) hours as needed for severe pain. 30 tablet 0  . triamcinolone (KENALOG) 0.1 % paste Use as directed 1 application in the mouth or throat daily.  1  . triamcinolone cream (KENALOG) 0.1 % Apply 1 application topically 2 (two) times daily.     No current facility-administered medications for this visit.      OBJECTIVE: There were no vitals filed for this visit.   There is no height or weight on file to calculate BMI.    ECOG FS:0 - Asymptomatic  General: Well-developed, well-nourished, no acute distress. Eyes: Pink conjunctiva, anicteric sclera. HEENT: Normocephalic, moist mucous membranes, clear oropharnyx. Lungs: Clear to auscultation bilaterally. Heart: Regular rate and rhythm. No rubs, murmurs, or gallops. Abdomen: Soft, nontender, nondistended. No organomegaly noted, normoactive bowel sounds. Musculoskeletal: No edema, cyanosis, or clubbing. Neuro: Alert, answering all questions appropriately. Cranial nerves grossly intact. Skin: No rashes or petechiae noted. Psych: Normal affect.  LAB RESULTS:  Lab Results  Component Value Date   NA 144 11/19/2017   K 4.8 11/19/2017   CL 104 11/19/2017   CO2 30 11/19/2017   GLUCOSE 108 (H) 11/19/2017   BUN 61 (H) 11/19/2017   CREATININE 2.76 (H) 11/19/2017   CALCIUM 7.6 (L) 11/19/2017   PROT 5.9 (L) 11/15/2017   ALBUMIN 1.9 (L) 11/18/2017  AST 12 (L) 11/15/2017   ALT 13 11/15/2017   ALKPHOS 71 11/15/2017   BILITOT 0.3 11/15/2017   GFRNONAA 17 (L) 11/19/2017   GFRAA 20 (L) 11/19/2017    Lab Results  Component Value Date   WBC 8.4 12/17/2017   NEUTROABS 6.3 12/17/2017   HGB 7.6 (L) 12/17/2017   HCT 24.0 (L) 12/17/2017   MCV 80.0 12/17/2017   PLT 305 12/17/2017   Lab Results  Component Value Date   FERRITIN 31 12/11/2017   Lab Results  Component Value Date   IRON 39 12/11/2017   TIBC 388 12/11/2017   IRONPCTSAT 10 (L) 12/11/2017      STUDIES: No results found.  ASSESSMENT: Iron deficiency anemia.  PLAN:    1. Iron deficiency anemia: Patient's hemoglobin has significantly declined and she has reduced iron stores as well.  Previously, the remainder of her blood work was either negative or within normal limits.  Unclear etiology of patient's acute drop, but is likely GI in origin.  She has not had a colonoscopy or  upper endoscopy since August 2016.  Patient will return to clinic on Monday, December 14, 2017 and received 1 unit of packed red blood cells.  She will then return to clinic on Thursday for laboratory work and on Friday for further evaluation and consideration of additional blood, or possibly only Fera[place]me.   2. Hyperglycemia: Continue insulin and diabetic medications as prescribed.  I spent a total of 30 minutes face-to-face with the patient of which greater than 50% of the visit was spent in counseling and coordination of care as detailed above.   Patient expressed understanding and was in agreement with this plan. She also understands that She can call clinic at any time with any questions, concerns, or complaints.    Lloyd Huger, MD   12/21/2017 2:34 PM

## 2017-12-23 ENCOUNTER — Inpatient Hospital Stay: Payer: Medicare Other

## 2017-12-23 ENCOUNTER — Telehealth: Payer: Self-pay | Admitting: Obstetrics & Gynecology

## 2017-12-23 ENCOUNTER — Other Ambulatory Visit: Payer: Self-pay | Admitting: *Deleted

## 2017-12-23 DIAGNOSIS — D649 Anemia, unspecified: Secondary | ICD-10-CM

## 2017-12-23 DIAGNOSIS — D509 Iron deficiency anemia, unspecified: Secondary | ICD-10-CM | POA: Diagnosis not present

## 2017-12-23 LAB — CBC WITH DIFFERENTIAL/PLATELET
BASOS ABS: 0.1 10*3/uL (ref 0–0.1)
Basophils Relative: 1 %
EOS ABS: 0.1 10*3/uL (ref 0–0.7)
EOS PCT: 1 %
HCT: 31.3 % — ABNORMAL LOW (ref 35.0–47.0)
Hemoglobin: 10 g/dL — ABNORMAL LOW (ref 12.0–16.0)
LYMPHS PCT: 23 %
Lymphs Abs: 1.6 10*3/uL (ref 1.0–3.6)
MCH: 26 pg (ref 26.0–34.0)
MCHC: 32 g/dL (ref 32.0–36.0)
MCV: 81.3 fL (ref 80.0–100.0)
MONO ABS: 0.4 10*3/uL (ref 0.2–0.9)
Monocytes Relative: 5 %
Neutro Abs: 5 10*3/uL (ref 1.4–6.5)
Neutrophils Relative %: 70 %
PLATELETS: 273 10*3/uL (ref 150–440)
RBC: 3.85 MIL/uL (ref 3.80–5.20)
RDW: 18.9 % — AB (ref 11.5–14.5)
WBC: 7.1 10*3/uL (ref 3.6–11.0)

## 2017-12-23 LAB — SAMPLE TO BLOOD BANK

## 2017-12-23 NOTE — Telephone Encounter (Signed)
[facility] referring Hx of cervical cancer. Has not had Pap in over 10 years. Needs to re-establish care. Called and left voicemail for patient to call back to be schedule

## 2017-12-24 ENCOUNTER — Other Ambulatory Visit: Payer: Medicare Other

## 2017-12-24 IMAGING — CT CT ABD-PEL WO/W CM
3 of 12 series · 12 of 46 positions shown, 18 images · IV contrast (APPLIED)
Comparison: 01/01/2015

CLINICAL DATA: Hematuria, anterior pelvic pain, status post
appendectomy, bilateral hip replacement

EXAM:
CT ABDOMEN AND PELVIS WITHOUT AND WITH CONTRAST
TECHNIQUE: Multidetector CT imaging of the abdomen and pelvis was performed
following the standard protocol before and following the bolus
administration of intravenous contrast.
CONTRAST:  125 mL Isovue 300 IV

[Series 2: axial pre · axial · non-contrast · 0.89mm/px · z∈[-265,+50]mm · 6 of 89 slices shown, 11 images]
[im 13/89  soft-tissue]
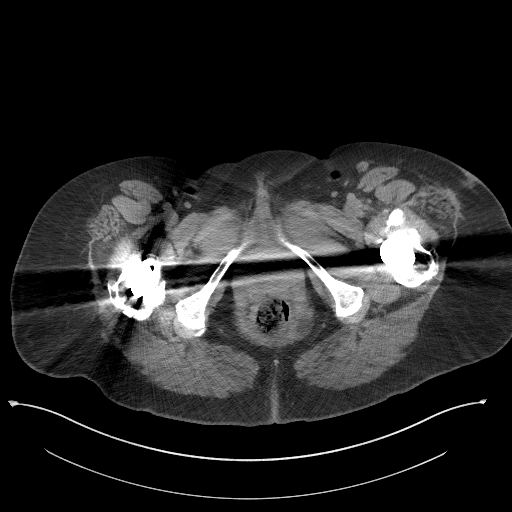
[im 13/89  bone]
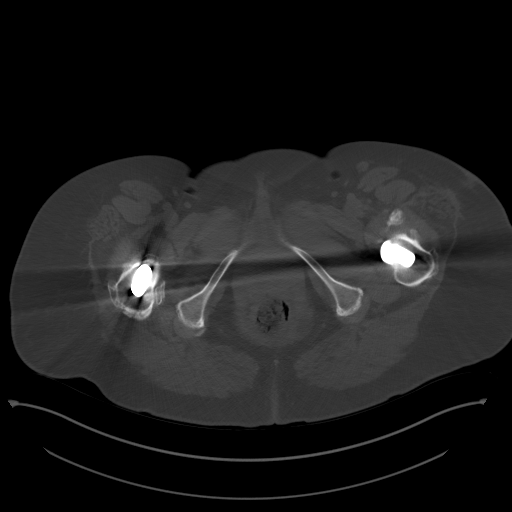
[im 26/89  soft-tissue]
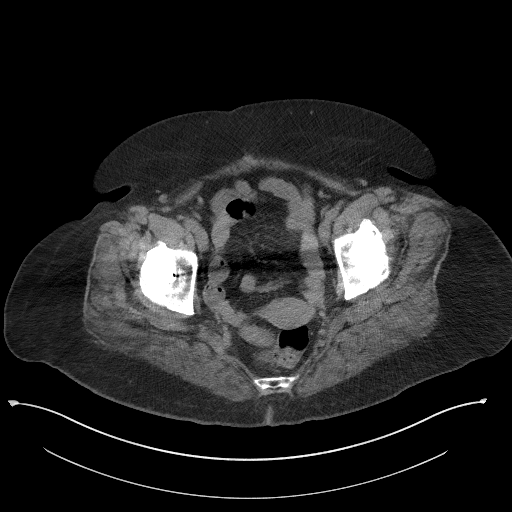
[im 38/89  soft-tissue]
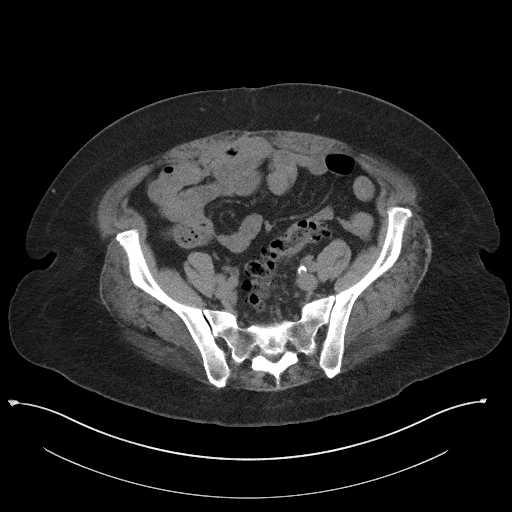
[im 38/89  lung]
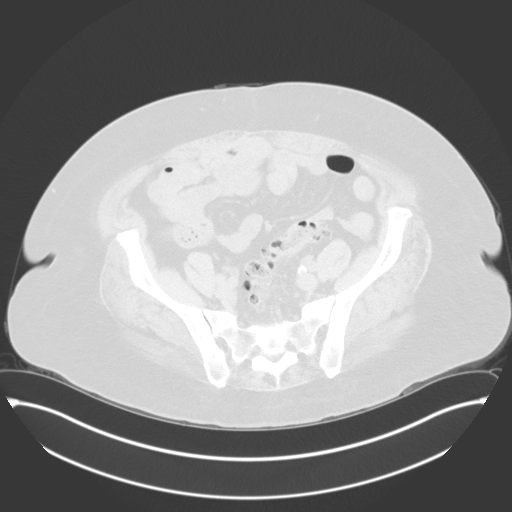
[im 51/89  soft-tissue]
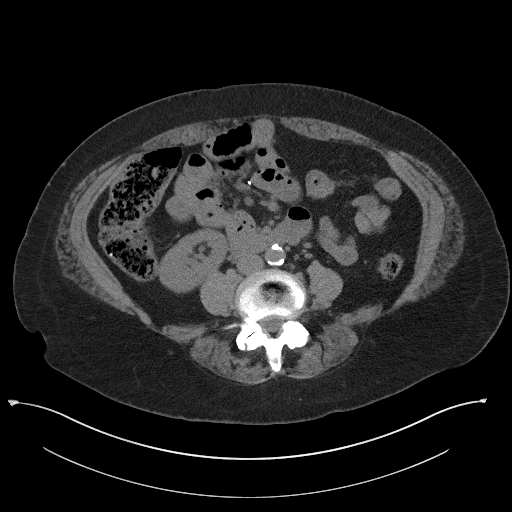
[im 51/89  lung]
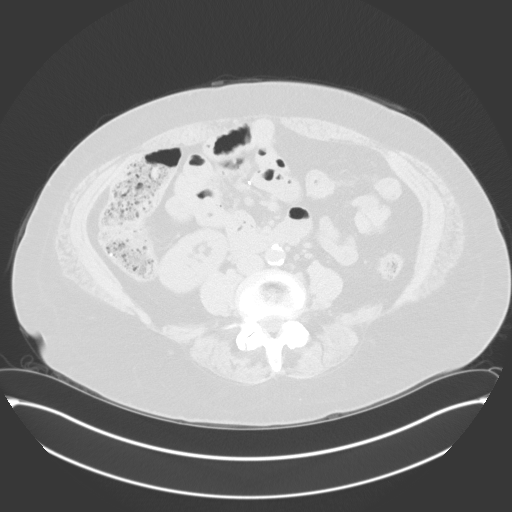
[im 63/89  soft-tissue]
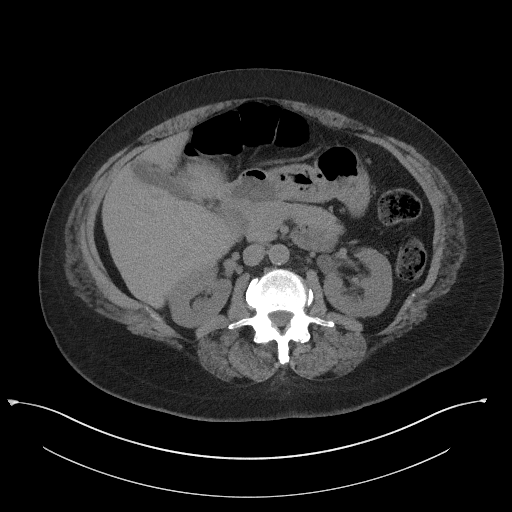
[im 63/89  lung]
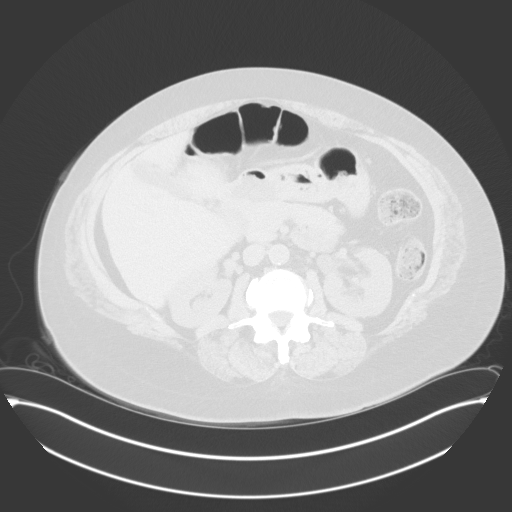
[im 76/89  soft-tissue]
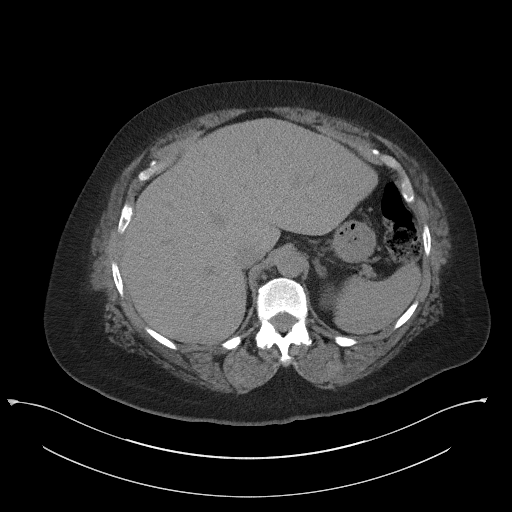
[im 76/89  lung]
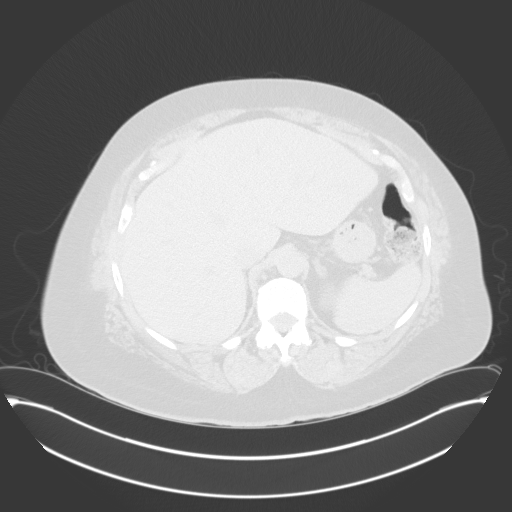

[Series 5: coronal pre · coronal · non-contrast · 0.78mm/px · 2 of 106 slices shown, 3 images]
[im 36/106  soft-tissue]
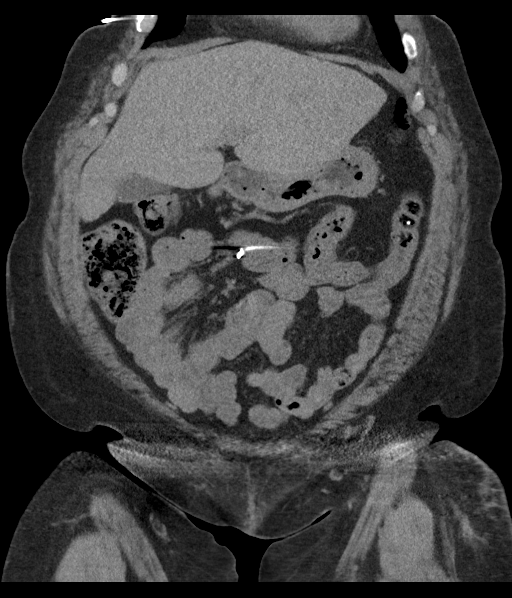
[im 36/106  bone]
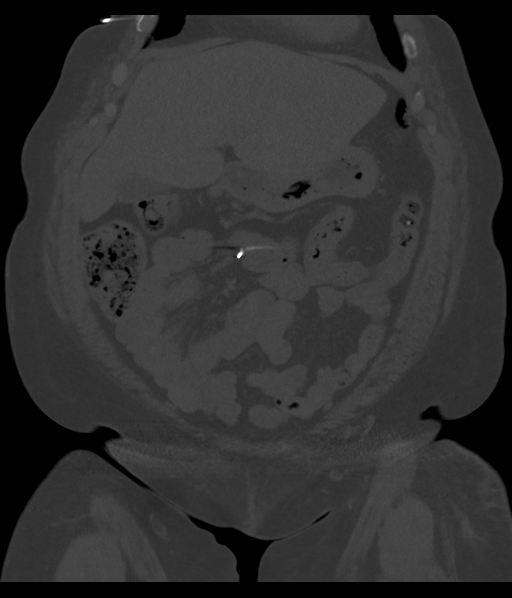
[im 71/106  soft-tissue]
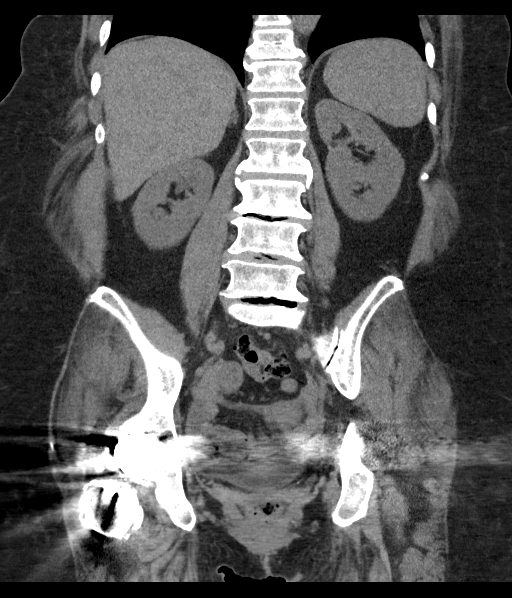

[Series 8: axial post · axial · 0.89mm/px · z∈[-255,-35]mm · 4 of 89 slices shown]
[im 15/89  soft-tissue]
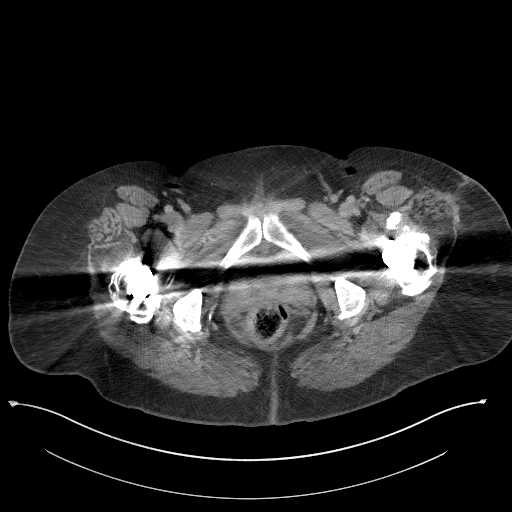
[im 30/89  soft-tissue]
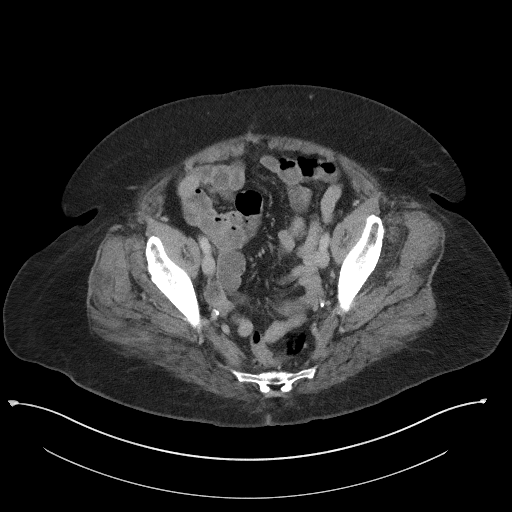
[im 45/89  soft-tissue]
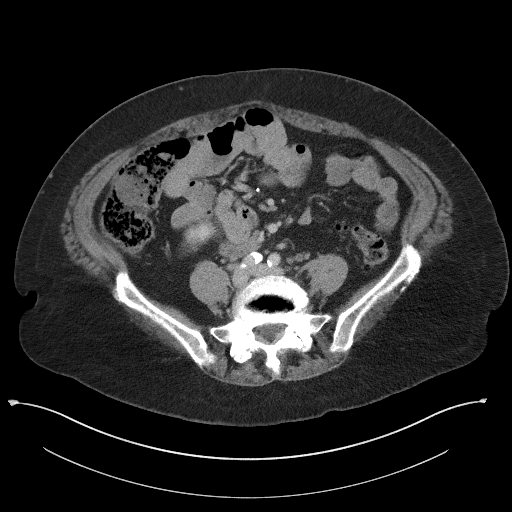
[im 59/89  soft-tissue]
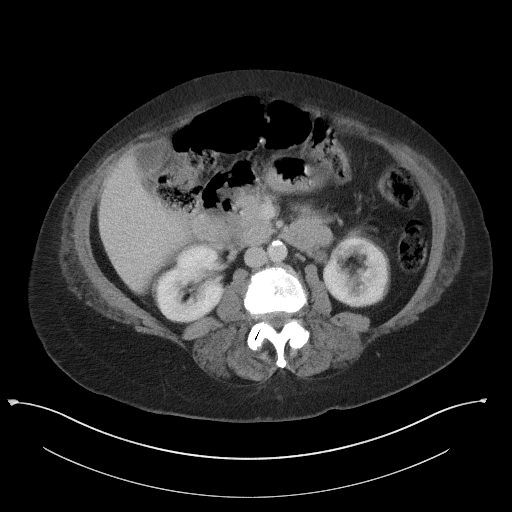

[12 of 46 positions shown; findings below may reference images not displayed]

FINDINGS: Lower chest: Lung bases are clear.

Hepatobiliary: Liver is within normal limits, noting a mildly
macronodular contour.

Gallbladder is unremarkable. No intrahepatic or extrahepatic duct
dilatation.

Pancreas: Within normal limits.

Spleen: Within normal limits.

Adrenals/Urinary Tract: Adrenal glands are within normal limits.

Mild cortical lobulation the kidneys bilaterally. No enhancing renal
lesions.

No renal or ureteral calculi.  No hydronephrosis.

On delayed imaging, there are no filling defects in the bilateral
opacified proximal collecting systems, ureters, or bladder.

Bladder is partially obscured by streak artifact but is
unremarkable.

Stomach/Bowel: Stomach is within normal limits.

No evidence of bowel obstruction.

Prior appendectomy.

Sigmoid diverticulosis without evidence of diverticulitis.

Vascular/Lymphatic: No evidence of abdominal aortic aneurysm.

Atherosclerotic calcifications of the abdominal aorta and branch
vessels.

No suspicious abdominopelvic lymphadenopathy.

Reproductive: Uterus is within normal limits.

No adnexal masses.

Other: No abdominopelvic ascites.

Musculoskeletal: Mild degenerative changes of the visualized
thoracolumbar spine.

Bilateral hip arthroplasty, without evidence of complication.
IMPRESSION: No CT findings to account for the patient's hematuria.

No CT findings to account for the patient's anterior pelvic pain.

Status post appendectomy and bilateral hip arthroplasty.

Additional ancillary findings as above.

## 2017-12-25 ENCOUNTER — Inpatient Hospital Stay (HOSPITAL_BASED_OUTPATIENT_CLINIC_OR_DEPARTMENT_OTHER): Payer: Medicare Other | Admitting: Oncology

## 2017-12-25 ENCOUNTER — Ambulatory Visit: Payer: Medicare Other | Admitting: Oncology

## 2017-12-25 ENCOUNTER — Other Ambulatory Visit: Payer: Self-pay

## 2017-12-25 ENCOUNTER — Ambulatory Visit: Payer: Medicare Other

## 2017-12-25 ENCOUNTER — Encounter: Payer: Self-pay | Admitting: Oncology

## 2017-12-25 VITALS — BP 138/78 | HR 98 | Temp 97.7°F | Resp 18 | Wt 213.2 lb

## 2017-12-25 DIAGNOSIS — Z794 Long term (current) use of insulin: Secondary | ICD-10-CM

## 2017-12-25 DIAGNOSIS — N189 Chronic kidney disease, unspecified: Secondary | ICD-10-CM

## 2017-12-25 DIAGNOSIS — D509 Iron deficiency anemia, unspecified: Secondary | ICD-10-CM

## 2017-12-25 DIAGNOSIS — E1365 Other specified diabetes mellitus with hyperglycemia: Secondary | ICD-10-CM | POA: Diagnosis not present

## 2017-12-25 DIAGNOSIS — Z79899 Other long term (current) drug therapy: Secondary | ICD-10-CM

## 2017-12-25 DIAGNOSIS — D649 Anemia, unspecified: Secondary | ICD-10-CM

## 2017-12-25 NOTE — Progress Notes (Signed)
[name]  [phone]) [phone] [phone]) [phone]  ID: Deanna Gay OB: 01-30-1956  MR#: [id number]  AOZ#:[id number]  Patient Care Team: Kirk Ruths, MD as PCP - General (Internal Medicine)  CHIEF COMPLAINT: Iron deficiency anemia.  INTERVAL HISTORY: Patient returns to clinic today for repeat laboratory work and further evaluation.  She feels significantly improved and nearly back to her baseline after receiving 2 units of blood earlier this week.  She continues to have occasional dizziness and lightheadedness, but otherwise feels well.  She has no other neurologic complaints.  She has no chest pain or shortness of breath. She denies any nausea, vomiting, constipation, or diarrhea. She has no melena or hematochezia.  She has no urinary complaints.  Patient offers no further specific complaints today.  REVIEW OF SYSTEMS:   Review of Systems  Constitutional: Negative.  Negative for malaise/fatigue and weight loss.  Respiratory: Negative.  Negative for cough, hemoptysis and shortness of breath.   Cardiovascular: Negative.  Negative for chest pain and leg swelling.  Gastrointestinal: Negative.  Negative for abdominal pain, blood in stool and melena.  Genitourinary: Negative.  Negative for frequency and hematuria.  Musculoskeletal: Negative.  Negative for myalgias.  Skin: Negative.  Negative for rash.  Neurological: Positive for dizziness. Negative for focal weakness, weakness and headaches.  Psychiatric/Behavioral: Negative.  The patient is not nervous/anxious.     As per HPI. Otherwise, a complete review of systems is negative.  PAST MEDICAL HISTORY: Past Medical History:  Diagnosis Date  . Arthritis   . Cancer ([place])    cervical  . COPD (chronic obstructive pulmonary disease) ([place])   . Depression   . Diabetes mellitus without complication ([place])   . GERD (gastroesophageal reflux disease)   . Hypertension   . Schizophrenia ([place])   . Sleep  apnea     PAST SURGICAL HISTORY: Past Surgical History:  Procedure Laterality Date  . APPENDECTOMY    . COLONOSCOPY WITH PROPOFOL N/A 12/25/2014   Procedure: COLONOSCOPY WITH PROPOFOL;  Surgeon: Josefine Class, MD;  Location: [facility] ENDOSCOPY;  Service: Endoscopy;  Laterality: N/A;  . ESOPHAGOGASTRODUODENOSCOPY (EGD) WITH PROPOFOL N/A 12/25/2014   Procedure: ESOPHAGOGASTRODUODENOSCOPY (EGD) WITH PROPOFOL;  Surgeon: Josefine Class, MD;  Location: [facility] ENDOSCOPY;  Service: Endoscopy;  Laterality: N/A;  . JOINT REPLACEMENT     right hip  . TOTAL HIP ARTHROPLASTY Left 05/16/2016   Procedure: LEFT TOTAL HIP ARTHROPLASTY ANTERIOR APPROACH;  Surgeon: Mcarthur Rossetti, MD;  Location: [facility] ORS;  Service: Orthopedics;  Laterality: Left;    FAMILY HISTORY Family History  Problem Relation Age of Onset  . Hypertension Mother   . Diabetes Mellitus II Mother   . Breast cancer Neg Hx   . Kidney cancer Neg Hx   . Bladder Cancer Neg Hx        ADVANCED DIRECTIVES:    HEALTH MAINTENANCE: Social History   Tobacco Use  . Smoking status: Current Every Day Smoker    Packs/day: 1.00    Years: 47.00    Pack years: 47.00  . Smokeless tobacco: Never Used  Substance Use Topics  . Alcohol use: No  . Drug use: No     Colonoscopy:  PAP:  Bone density:  Lipid panel:  Allergies  Allergen Reactions  . Aspirin Other (See Comments)    Ongoing anemia  . Iodine Swelling  . Penicillins Swelling    Has patient had a PCN reaction causing immediate rash, facial/tongue/throat swelling, SOB or lightheadedness with hypotension: Yes  Has patient had a PCN reaction causing severe rash involving mucus membranes or skin necrosis: Yes Has patient had a PCN reaction that required hospitalization: No Has patient had a PCN reaction occurring within the last 10 years: No If all of the above answers are "NO", then may proceed with Cephalosporin use.     Current Outpatient Medications  Medication Sig  Dispense Refill  . albuterol (PROVENTIL HFA;VENTOLIN HFA) 108 (90 BASE) MCG/ACT inhaler Inhale 2 puffs into the lungs every 4 (four) hours as needed for wheezing or shortness of breath.     . budesonide-formoterol (SYMBICORT) 160-4.5 MCG/ACT inhaler Inhale 2 puffs into the lungs 2 (two) times daily.    . Continuous Blood Gluc Receiver (FREESTYLE LIBRE 14 DAY READER) DEVI Use 1 applicator 4 (four) times daily    . diltiazem (CARDIZEM CD) 180 MG 24 hr capsule Take 180 mg by mouth at bedtime.     . hydrochlorothiazide (HYDRODIURIL) 25 MG tablet take 1 tablet by mouth every morning    . insulin glargine (LANTUS) 100 UNIT/ML injection Inject 0.1 mLs (10 Units total) into the skin at bedtime. 10 mL 11  . insulin lispro (HUMALOG) 100 UNIT/ML injection Please check your sugars before each meal and use lispro per the below sliding scale:  For sugars 151-200: take 2 units For 201-250: take 4units For 251-300: take 6 units For 301-350: take 8 units For 351-400: take 10 units For > 401: call MD 10 mL 11  . Insulin Syringe-Needle U-100 (BD INSULIN SYRINGE ULTRAFINE) 31G X 5/16" 0.5 ML MISC use as directed once daily    . ipratropium-albuterol (DUONEB) 0.5-2.5 (3) MG/3ML SOLN Take 3 mLs by nebulization every 6 (six) hours as needed.    . iron polysaccharides (IFEREX 150) 150 MG capsule Take 1 capsule (150 mg total) by mouth 2 (two) times daily. 200 capsule 1  . nitroGLYCERIN (NITROSTAT) 0.4 MG SL tablet Place 1 tablet under the tongue every 5 (five) minutes as needed.  0  . omeprazole (PRILOSEC) 20 MG capsule Take 20 mg by mouth 2 (two) times daily before a meal.   0  . oxyCODONE-acetaminophen (PERCOCET) 7.5-325 MG tablet Take 1-2 tablets by mouth every 6 (six) hours as needed for severe pain. 30 tablet 0  . triamcinolone (KENALOG) 0.1 % paste Use as directed 1 application in the mouth or throat daily.  1  . triamcinolone cream (KENALOG) 0.1 % Apply 1 application topically 2 (two) times daily.    Marland Kitchen  ACCU-CHEK AVIVA PLUS test strip use 1 TEST STRIP to TEST BLOOD SUGAR four times a day  0  . aspirin 81 MG chewable tablet Chew 1 tablet (81 mg total) by mouth 2 (two) times daily. (Patient not taking: Reported on 12/25/2017) 60 tablet 0  . Blood Glucose Monitoring Suppl (ACCU-CHEK AVIVA PLUS) w/Device KIT See admin instructions.  0  . nicotine (NICODERM CQ - DOSED IN MG/24 HOURS) 21 mg/24hr patch Place 1 patch onto the skin daily.  0   No current facility-administered medications for this visit.     OBJECTIVE: Vitals:   12/25/17 0943  BP: 138/78  Pulse: 98  Resp: 18  Temp: 97.7 F (36.5 C)     Body mass index is 36.59 kg/m.    ECOG FS:0 - Asymptomatic  General: Well-developed, well-nourished, no acute distress. Eyes: Pink conjunctiva, anicteric sclera. HEENT: Normocephalic, moist mucous membranes. Lungs: Clear to auscultation bilaterally. Heart: Regular rate and rhythm. No rubs, murmurs, or gallops. Abdomen: Soft, nontender, nondistended.  No organomegaly noted, normoactive bowel sounds. Musculoskeletal: No edema, cyanosis, or clubbing. Neuro: Alert, answering all questions appropriately. Cranial nerves grossly intact. Skin: No rashes or petechiae noted. Psych: Normal affect.  LAB RESULTS:  Lab Results  Component Value Date   NA 144 11/19/2017   K 4.8 11/19/2017   CL 104 11/19/2017   CO2 30 11/19/2017   GLUCOSE 108 (H) 11/19/2017   BUN 61 (H) 11/19/2017   CREATININE 2.76 (H) 11/19/2017   CALCIUM 7.6 (L) 11/19/2017   PROT 5.9 (L) 11/15/2017   ALBUMIN 1.9 (L) 11/18/2017   AST 12 (L) 11/15/2017   ALT 13 11/15/2017   ALKPHOS 71 11/15/2017   BILITOT 0.3 11/15/2017   GFRNONAA 17 (L) 11/19/2017   GFRAA 20 (L) 11/19/2017    Lab Results  Component Value Date   WBC 7.1 12/23/2017   NEUTROABS 5.0 12/23/2017   HGB 10.0 (L) 12/23/2017   HCT 31.3 (L) 12/23/2017   MCV 81.3 12/23/2017   PLT 273 12/23/2017   Lab Results  Component Value Date   FERRITIN 31 12/11/2017   Lab  Results  Component Value Date   IRON 39 12/11/2017   TIBC 388 12/11/2017   IRONPCTSAT 10 (L) 12/11/2017      STUDIES: No results found.  ASSESSMENT: Iron deficiency anemia.  PLAN:    1. Iron deficiency anemia: Patient's hemoglobin has significantly improved with blood transfusion and is now 10.0.  Previously, the remainder of her blood work was either negative or within normal limits.  Unclear etiology of patient's acute drop, but is likely GI in origin.  She has not had a colonoscopy or upper endoscopy since August 2016.  She has an appointment with GI on December 29, 2017.  Return to clinic in 2 weeks with repeat laboratory work and further evaluation.  2. Hyperglycemia: Chronic and unchanged.  Continue insulin and diabetic medications as prescribed. 3.  Chronic renal insufficiency: Patient last noted to have a creatinine of 2.76 approximately 1 month ago.  This has improved from previous.  We will repeat basic metabolic panel with next lab draw and consider referral to nephrology if still elevated.  Also, patient may benefit from Procrit if her hemoglobin falls below 10.0.   Patient expressed understanding and was in agreement with this plan. She also understands that She can call clinic at any time with any questions, concerns, or complaints.    Lloyd Huger, MD   12/25/2017 10:42 AM

## 2017-12-25 NOTE — Progress Notes (Signed)
Patient here for follow up. She states she feel weak and tired this morning.

## 2017-12-29 ENCOUNTER — Ambulatory Visit (INDEPENDENT_AMBULATORY_CARE_PROVIDER_SITE_OTHER): Payer: Medicare Other | Admitting: Gastroenterology

## 2017-12-29 ENCOUNTER — Other Ambulatory Visit: Payer: Self-pay

## 2017-12-29 ENCOUNTER — Encounter: Payer: Self-pay | Admitting: Gastroenterology

## 2017-12-29 VITALS — BP 121/67 | HR 99 | Resp 18 | Wt 214.4 lb

## 2017-12-29 DIAGNOSIS — D500 Iron deficiency anemia secondary to blood loss (chronic): Secondary | ICD-10-CM

## 2017-12-29 DIAGNOSIS — E538 Deficiency of other specified B group vitamins: Secondary | ICD-10-CM | POA: Diagnosis not present

## 2017-12-29 MED ORDER — FOLIC ACID 1 MG PO TABS: 1.0000 mg | ORAL_TABLET | Freq: Every day | ORAL | 0 refills | Status: DC

## 2017-12-29 NOTE — Progress Notes (Signed)
Deanna Darby, MD [address]  [place]  [place], [place] [address]  Main: [phone]  Fax: [phone]    Gastroenterology Consultation  Referring Provider:     Jacquelin Hawking, NP Primary Care Physician:  Kirk Ruths, MD Primary Gastroenterologist:  Dr. Cephas Gay Reason for Consultation:  iron deficiency anemia        HPI:   Deanna Gay is a 62 y.o. African-American female referred by Dr. Ouida Sills, Deanna Cornfield, MD  for consultation & management of chronic iron deficiency anemia. She has history of diabetes, chronic tobacco use, COPD who was  admitted to [facility] on 10/29/2017 to 11/19/2017 secondary to UTI sepsis from Escherichia coli bacteremia that resulted in acute renal failure. She required temporary hemodialysis during that admission. She also developed acute on chronic normocytic anemia. Her last normal hemoglobin was 12.9 in 05/2017, lowest hemoglobin 7.4 during that admission. Patient has been followed by Dr. Cranford Mon at [place] for iron deficiency anemia patient received blood transfusion on 12/14/17 and her hemoglobin responded appropriately. Patient has history of chronic iron deficiency anemia, her ferritin was 2 in 02/2015 and underwent EGD and colonoscopy by Dr Rayann Heman and no source of bleeding was identified. Patient reports taking Aleve one to 2 tablets a few times a week for chronic muscular skeletal pain. Most recent ferritin is 31 and folate is low at 5, B12 298. Patient has been receiving IV iron as needed at [place]. Patient reports intermittent black stools. She denies rectal bleeding She denies chest pain, abdominal pain, shortness of breath, nausea or vomiting  NSAIDs: Aleve 1-2 tablets daily  Antiplts/Anticoagulants/Anti thrombotics: none  GI Procedures: EGD in 2016 - White nummular lesions in esophageal mucosa. Biopsied. - Normal stomach. - Normal examined duodenum.  Colonoscopy  12/25/2014 - Diverticulosis in the sigmoid colon. - The examination was otherwise normal on direct and retroflexion views. - No specimens collected.  Past Medical History:  Diagnosis Date  . Arthritis   . Cancer ([place])    cervical  . COPD (chronic obstructive pulmonary disease) ([place])   . Depression   . Diabetes mellitus without complication ([place])   . GERD (gastroesophageal reflux disease)   . Hypertension   . Schizophrenia ([place])   . Sleep apnea     Past Surgical History:  Procedure Laterality Date  . APPENDECTOMY    . COLONOSCOPY WITH PROPOFOL N/A 12/25/2014   Procedure: COLONOSCOPY WITH PROPOFOL;  Surgeon: Josefine Class, MD;  Location: [facility] ENDOSCOPY;  Service: Endoscopy;  Laterality: N/A;  . ESOPHAGOGASTRODUODENOSCOPY (EGD) WITH PROPOFOL N/A 12/25/2014   Procedure: ESOPHAGOGASTRODUODENOSCOPY (EGD) WITH PROPOFOL;  Surgeon: Josefine Class, MD;  Location: [facility] ENDOSCOPY;  Service: Endoscopy;  Laterality: N/A;  . JOINT REPLACEMENT     right hip  . TOTAL HIP ARTHROPLASTY Left 05/16/2016   Procedure: LEFT TOTAL HIP ARTHROPLASTY ANTERIOR APPROACH;  Surgeon: Mcarthur Rossetti, MD;  Location: [facility] ORS;  Service: Orthopedics;  Laterality: Left;    Current Outpatient Medications:  .  ACCU-CHEK AVIVA PLUS test strip, use 1 TEST STRIP to TEST BLOOD SUGAR four times a day, Disp: , Rfl: 0 .  albuterol (PROVENTIL HFA;VENTOLIN HFA) 108 (90 BASE) MCG/ACT inhaler, Inhale 2 puffs into the lungs every 4 (four) hours as needed for wheezing or shortness of breath. , Disp: , Rfl:  .  Blood Glucose Monitoring Suppl (ACCU-CHEK AVIVA PLUS) w/Device KIT, See admin instructions., Disp: , Rfl: 0 .  budesonide-formoterol (SYMBICORT) 160-4.5  MCG/ACT inhaler, Inhale 2 puffs into the lungs 2 (two) times daily., Disp: , Rfl:  .  Continuous Blood Gluc Receiver (FREESTYLE LIBRE 14 DAY READER) DEVI, Use 1 applicator 4 (four) times daily, Disp: , Rfl:  .  diltiazem (CARDIZEM CD) 180 MG 24 hr capsule, Take  180 mg by mouth at bedtime. , Disp: , Rfl:  .  hydrochlorothiazide (HYDRODIURIL) 25 MG tablet, take 1 tablet by mouth every morning, Disp: , Rfl:  .  insulin glargine (LANTUS) 100 UNIT/ML injection, Inject 0.1 mLs (10 Units total) into the skin at bedtime., Disp: 10 mL, Rfl: 11 .  insulin lispro (HUMALOG) 100 UNIT/ML injection, Please check your sugars before each meal and use lispro per the below sliding scale:  For sugars 151-200: take 2 units For 201-250: take 4units For 251-300: take 6 units For 301-350: take 8 units For 351-400: take 10 units For > 401: call MD, Disp: 10 mL, Rfl: 11 .  Insulin Syringe-Needle U-100 (BD INSULIN SYRINGE ULTRAFINE) 31G X 5/16" 0.5 ML MISC, use as directed once daily, Disp: , Rfl:  .  ipratropium-albuterol (DUONEB) 0.5-2.5 (3) MG/3ML SOLN, Take 3 mLs by nebulization every 6 (six) hours as needed., Disp: , Rfl:  .  iron polysaccharides (IFEREX 150) 150 MG capsule, Take 1 capsule (150 mg total) by mouth 2 (two) times daily., Disp: 200 capsule, Rfl: 1 .  nitroGLYCERIN (NITROSTAT) 0.4 MG SL tablet, Place 1 tablet under the tongue every 5 (five) minutes as needed., Disp: , Rfl: 0 .  omeprazole (PRILOSEC) 20 MG capsule, Take 20 mg by mouth 2 (two) times daily before a meal. , Disp: , Rfl: 0 .  oxyCODONE-acetaminophen (PERCOCET) 7.5-325 MG tablet, Take 1-2 tablets by mouth every 6 (six) hours as needed for severe pain., Disp: 30 tablet, Rfl: 0 .  triamcinolone (KENALOG) 0.1 % paste, Use as directed 1 application in the mouth or throat daily., Disp: , Rfl: 1 .  triamcinolone cream (KENALOG) 0.1 %, Apply 1 application topically 2 (two) times daily., Disp: , Rfl:  .  aspirin 81 MG chewable tablet, Chew 1 tablet (81 mg total) by mouth 2 (two) times daily. (Patient not taking: Reported on 12/25/2017), Disp: 60 tablet, Rfl: 0 .  folic acid (FOLVITE) 1 MG tablet, Take 1 tablet (1 mg total) by mouth daily., Disp: 30 tablet, Rfl: 0 .  nicotine (NICODERM CQ - DOSED IN MG/24 HOURS) 21  mg/24hr patch, Place 1 patch onto the skin daily., Disp: , Rfl: 0   Family History  Problem Relation Age of Onset  . Hypertension Mother   . Diabetes Mellitus II Mother   . Breast cancer Neg Hx   . Kidney cancer Neg Hx   . Bladder Cancer Neg Hx      Social History   Tobacco Use  . Smoking status: Current Every Day Smoker    Packs/day: 1.00    Years: 47.00    Pack years: 47.00  . Smokeless tobacco: Never Used  Substance Use Topics  . Alcohol use: No  . Drug use: No    Allergies as of 12/29/2017 - Review Complete 12/29/2017  Allergen Reaction Noted  . Aspirin Other (See Comments) 11/26/2016  . Iodine Swelling 12/22/2014  . Penicillins Swelling 12/25/2014    Review of Systems:    All systems reviewed and negative except where noted in HPI.   Physical Exam:  BP 121/67 (BP Location: Left Arm, Patient Position: Sitting, Cuff Size: Large)   Pulse 99   Resp  18   Wt 214 lb 6.4 oz (97.3 kg)   LMP  (LMP Unknown) Comment: postmenopausal  BMI 36.80 kg/m  No LMP recorded (lmp unknown). Patient is postmenopausal.  General:   Alert,  Well-developed, well-nourished, pleasant and cooperative in NAD Head:  Normocephalic and atraumatic. Eyes:  Sclera clear, no icterus.   Conjunctiva pink. Ears:  Normal auditory acuity. Nose:  No deformity, discharge, or lesions. Mouth:  No deformity or lesions,oropharynx pink & moist. Neck:  Supple; no masses or thyromegaly. Lungs:  Respirations even and unlabored.  Clear throughout to auscultation.   No wheezes, crackles, or rhonchi. No acute distress. Heart:  Regular rate and rhythm; no murmurs, clicks, rubs, or gallops. Abdomen:  Normal bowel sounds. Soft, non-tender and non-distended without masses, hepatosplenomegaly or hernias noted.  No guarding or rebound tenderness.   Rectal: Not performed Msk:  Symmetrical without gross deformities. Good, equal movement & strength bilaterally. Pulses:  Normal pulses noted. Extremities:  No clubbing or  edema.  No cyanosis. Neurologic:  Alert and oriented x3;  grossly normal neurologically. Skin:  Intact without significant lesions or rashes. No jaundice. Lymph Nodes:  No significant cervical adenopathy. Psych:  Alert and cooperative. Normal mood and affect.  Imaging Studies: Reviewed  Assessment and Plan:   KATLEN SEYER is a 62 y.o. African-American female with history of diabetes, metabolic syndrome, chronic iron deficiency anemia of unclear etiology, Escherichia coli bacteremia resulting in acute renal failure, temporary hemodialysis seen in consultation for chronic iron deficiency anemia. Patient underwent EGD and colonoscopy in 2016 and no bleeding source was identified. She does report history of chronic intermittent NSAID use and intermittent black stools  Recommend EGD and colonoscopy for further evaluation Recommend video capsule endoscopy if above tests are negative Follow-up with Dr. Grayland Ormond at [place] for parenteral iron therapy Start oral folate 1 mg daily for folate deficiency Avoid NSAIDs   Follow up in 4 weeks   Deanna Darby, MD

## 2018-01-05 NOTE — Progress Notes (Signed)
[place]  [phone] [phone]  ID: Deanna Gay OB: [birth year]  MR#: [id number]  GHW#:[id number]  Patient Care Team: Kirk Ruths, MD as PCP - General (Internal Medicine)  CHIEF COMPLAINT: Iron deficiency anemia.  INTERVAL HISTORY: Patient returns to clinic today for repeat laboratory work and further evaluation.  She continues to feel improved and approximately at her baseline.  She does not complain of any dizziness or lightheadedness today.  She continues to have mild fatigue.  She has no neurologic complaints.  She denies any recent fevers. She has no chest pain or shortness of breath. She denies any nausea, vomiting, constipation, or diarrhea. She has no melena or hematochezia.  She has no urinary complaints.  Patient offers no specific complaints today.  REVIEW OF SYSTEMS:   Review of Systems  Constitutional: Positive for malaise/fatigue. Negative for weight loss.  Respiratory: Negative.  Negative for cough, hemoptysis and shortness of breath.   Cardiovascular: Negative.  Negative for chest pain and leg swelling.  Gastrointestinal: Negative.  Negative for abdominal pain, blood in stool and melena.  Genitourinary: Negative.  Negative for frequency and hematuria.  Musculoskeletal: Negative.  Negative for myalgias.  Skin: Negative.  Negative for rash.  Neurological: Negative.  Negative for dizziness, focal weakness, weakness and headaches.  Psychiatric/Behavioral: Negative.  The patient is not nervous/anxious.     As per HPI. Otherwise, a complete review of systems is negative.  PAST MEDICAL HISTORY: Past Medical History:  Diagnosis Date  . Arthritis   . Cancer ([place])    cervical  . COPD (chronic obstructive pulmonary disease) ([place])   . Depression   . Diabetes mellitus without complication ([place])   . GERD (gastroesophageal reflux disease)   . Hypertension   . Schizophrenia ([place])   . Sleep apnea     PAST SURGICAL  HISTORY: Past Surgical History:  Procedure Laterality Date  . APPENDECTOMY    . COLONOSCOPY WITH PROPOFOL N/A 12/25/2014   Procedure: COLONOSCOPY WITH PROPOFOL;  Surgeon: Josefine Class, MD;  Location: [facility] ENDOSCOPY;  Service: Endoscopy;  Laterality: N/A;  . ESOPHAGOGASTRODUODENOSCOPY (EGD) WITH PROPOFOL N/A 12/25/2014   Procedure: ESOPHAGOGASTRODUODENOSCOPY (EGD) WITH PROPOFOL;  Surgeon: Josefine Class, MD;  Location: [facility] ENDOSCOPY;  Service: Endoscopy;  Laterality: N/A;  . JOINT REPLACEMENT     right hip  . TOTAL HIP ARTHROPLASTY Left 05/16/2016   Procedure: LEFT TOTAL HIP ARTHROPLASTY ANTERIOR APPROACH;  Surgeon: Mcarthur Rossetti, MD;  Location: [facility] ORS;  Service: Orthopedics;  Laterality: Left;    FAMILY HISTORY Family History  Problem Relation Age of Onset  . Hypertension Mother   . Diabetes Mellitus II Mother   . Breast cancer Neg Hx   . Kidney cancer Neg Hx   . Bladder Cancer Neg Hx        ADVANCED DIRECTIVES:    HEALTH MAINTENANCE: Social History   Tobacco Use  . Smoking status: Current Every Day Smoker    Packs/day: 1.00    Years: 47.00    Pack years: 47.00  . Smokeless tobacco: Never Used  Substance Use Topics  . Alcohol use: No  . Drug use: No     Colonoscopy:  PAP:  Bone density:  Lipid panel:  Allergies  Allergen Reactions  . Aspirin Other (See Comments)    Ongoing anemia  . Iodine Swelling  . Penicillins Swelling    Has patient had a PCN reaction causing immediate rash, facial/tongue/throat swelling, SOB or lightheadedness with hypotension: Yes Has patient  had a PCN reaction causing severe rash involving mucus membranes or skin necrosis: Yes Has patient had a PCN reaction that required hospitalization: No Has patient had a PCN reaction occurring within the last 10 years: No If all of the above answers are "NO", then may proceed with Cephalosporin use.     Current Outpatient Medications  Medication Sig Dispense Refill  .  ACCU-CHEK AVIVA PLUS test strip use 1 TEST STRIP to TEST BLOOD SUGAR four times a day  0  . albuterol (PROVENTIL HFA;VENTOLIN HFA) 108 (90 BASE) MCG/ACT inhaler Inhale 2 puffs into the lungs every 4 (four) hours as needed for wheezing or shortness of breath.     . Blood Glucose Monitoring Suppl (ACCU-CHEK AVIVA PLUS) w/Device KIT See admin instructions.  0  . budesonide-formoterol (SYMBICORT) 160-4.5 MCG/ACT inhaler Inhale 2 puffs into the lungs 2 (two) times daily.    . Continuous Blood Gluc Receiver (FREESTYLE LIBRE 14 DAY READER) DEVI Use 1 applicator 4 (four) times daily    . diltiazem (CARDIZEM CD) 180 MG 24 hr capsule Take 180 mg by mouth at bedtime.     . folic acid (FOLVITE) 1 MG tablet Take 1 tablet (1 mg total) by mouth daily. 30 tablet 0  . hydrochlorothiazide (HYDRODIURIL) 25 MG tablet take 1 tablet by mouth every morning    . insulin glargine (LANTUS) 100 UNIT/ML injection Inject 0.1 mLs (10 Units total) into the skin at bedtime. 10 mL 11  . insulin lispro (HUMALOG) 100 UNIT/ML injection Please check your sugars before each meal and use lispro per the below sliding scale:  For sugars 151-200: take 2 units For 201-250: take 4units For 251-300: take 6 units For 301-350: take 8 units For 351-400: take 10 units For > 401: call MD 10 mL 11  . Insulin Syringe-Needle U-100 (BD INSULIN SYRINGE ULTRAFINE) 31G X 5/16" 0.5 ML MISC use as directed once daily    . ipratropium-albuterol (DUONEB) 0.5-2.5 (3) MG/3ML SOLN Take 3 mLs by nebulization every 6 (six) hours as needed.    . iron polysaccharides (IFEREX 150) 150 MG capsule Take 1 capsule (150 mg total) by mouth 2 (two) times daily. 200 capsule 1  . nicotine (NICODERM CQ - DOSED IN MG/24 HOURS) 21 mg/24hr patch Place 1 patch onto the skin daily.  0  . nitroGLYCERIN (NITROSTAT) 0.4 MG SL tablet Place 1 tablet under the tongue every 5 (five) minutes as needed.  0  . omeprazole (PRILOSEC) 20 MG capsule Take 20 mg by mouth 2 (two) times daily  before a meal.   0  . oxyCODONE-acetaminophen (PERCOCET) 7.5-325 MG tablet Take 1-2 tablets by mouth every 6 (six) hours as needed for severe pain. 30 tablet 0  . triamcinolone (KENALOG) 0.1 % paste Use as directed 1 application in the mouth or throat daily.  1  . triamcinolone cream (KENALOG) 0.1 % Apply 1 application topically 2 (two) times daily.    Marland Kitchen aspirin 81 MG chewable tablet Chew 1 tablet (81 mg total) by mouth 2 (two) times daily. (Patient not taking: Reported on 12/25/2017) 60 tablet 0   No current facility-administered medications for this visit.     OBJECTIVE: Vitals:   01/08/18 0842  BP: (!) 160/80  Pulse: 97  Resp: 20  Temp: (!) 97.3 F (36.3 C)     Body mass index is 36.52 kg/m.    ECOG FS:0 - Asymptomatic  General: Well-developed, well-nourished, no acute distress. Eyes: Pink conjunctiva, anicteric sclera. HEENT: Normocephalic, moist mucous  membranes. Lungs: Clear to auscultation bilaterally. Heart: Regular rate and rhythm. No rubs, murmurs, or gallops. Abdomen: Soft, nontender, nondistended. No organomegaly noted, normoactive bowel sounds. Musculoskeletal: No edema, cyanosis, or clubbing. Neuro: Alert, answering all questions appropriately. Cranial nerves grossly intact. Skin: No rashes or petechiae noted. Psych: Normal affect.  LAB RESULTS:  Lab Results  Component Value Date   NA 144 11/19/2017   K 4.8 11/19/2017   CL 104 11/19/2017   CO2 30 11/19/2017   GLUCOSE 108 (H) 11/19/2017   BUN 61 (H) 11/19/2017   CREATININE 2.76 (H) 11/19/2017   CALCIUM 7.6 (L) 11/19/2017   PROT 5.9 (L) 11/15/2017   ALBUMIN 1.9 (L) 11/18/2017   AST 12 (L) 11/15/2017   ALT 13 11/15/2017   ALKPHOS 71 11/15/2017   BILITOT 0.3 11/15/2017   GFRNONAA 17 (L) 11/19/2017   GFRAA 20 (L) 11/19/2017    Lab Results  Component Value Date   WBC 7.7 01/06/2018   NEUTROABS 5.2 01/06/2018   HGB 10.6 (L) 01/06/2018   HCT 32.8 (L) 01/06/2018   MCV 79.9 (L) 01/06/2018   PLT 294  01/06/2018   Lab Results  Component Value Date   FERRITIN 14 01/06/2018   Lab Results  Component Value Date   IRON 33 01/06/2018   TIBC 395 01/06/2018   IRONPCTSAT 8 (L) 01/06/2018      STUDIES: No results found.  ASSESSMENT: Iron deficiency anemia.  PLAN:    1. Iron deficiency anemia: Patient's hemoglobin has significantly improved to 10.6.  She does not require blood transfusion today.  Her iron stores remain decreased.  Previously, the remainder of her blood work was either negative or within normal limits.  Patient was evaluated by GI earlier this week and has a colonoscopy and endoscopy scheduled in early September.  Because of her decreased iron stores, we will proceed with 510 mg IV Feraheme today.  Return to clinic in 1 week for second infusion.  Patient will then return to clinic in 4 weeks for further evaluation and consideration of additional IV iron if necessary.   2. Hyperglycemia: Chronic and unchanged.  Continue insulin and diabetic medications as prescribed. 3.  Chronic renal insufficiency: Patient last noted to have a creatinine of 2.76 approximately 1 month ago.  This has improved from previous.  Repeat basic metabolic panel with next lab draw and consider referral to nephrology if still elevated.  Also, patient may benefit from Procrit if her hemoglobin falls below 10.0.  I spent a total of 30 minutes face-to-face with the patient of which greater than 50% of the visit was spent in counseling and coordination of care as detailed above.    Patient expressed understanding and was in agreement with this plan. She also understands that She can call clinic at any time with any questions, concerns, or complaints.    Lloyd Huger, MD   01/08/2018 12:16 PM

## 2018-01-06 ENCOUNTER — Inpatient Hospital Stay: Payer: Medicare Other

## 2018-01-06 DIAGNOSIS — D509 Iron deficiency anemia, unspecified: Secondary | ICD-10-CM | POA: Diagnosis not present

## 2018-01-06 DIAGNOSIS — D649 Anemia, unspecified: Secondary | ICD-10-CM

## 2018-01-06 LAB — CBC WITH DIFFERENTIAL/PLATELET
BASOS PCT: 1 %
Basophils Absolute: 0.1 10*3/uL (ref 0–0.1)
EOS ABS: 0.1 10*3/uL (ref 0–0.7)
Eosinophils Relative: 2 %
HCT: 32.8 % — ABNORMAL LOW (ref 35.0–47.0)
Hemoglobin: 10.6 g/dL — ABNORMAL LOW (ref 12.0–16.0)
LYMPHS ABS: 1.9 10*3/uL (ref 1.0–3.6)
Lymphocytes Relative: 24 %
MCH: 25.7 pg — ABNORMAL LOW (ref 26.0–34.0)
MCHC: 32.2 g/dL (ref 32.0–36.0)
MCV: 79.9 fL — ABNORMAL LOW (ref 80.0–100.0)
Monocytes Absolute: 0.4 10*3/uL (ref 0.2–0.9)
Monocytes Relative: 5 %
NEUTROS ABS: 5.2 10*3/uL (ref 1.4–6.5)
Neutrophils Relative %: 68 %
PLATELETS: 294 10*3/uL (ref 150–440)
RBC: 4.1 MIL/uL (ref 3.80–5.20)
RDW: 18.4 % — AB (ref 11.5–14.5)
WBC: 7.7 10*3/uL (ref 3.6–11.0)

## 2018-01-06 LAB — IRON AND TIBC
Iron: 33 ug/dL (ref 28–170)
SATURATION RATIOS: 8 % — AB (ref 10.4–31.8)
TIBC: 395 ug/dL (ref 250–450)
UIBC: 362 ug/dL

## 2018-01-06 LAB — SAMPLE TO BLOOD BANK

## 2018-01-06 LAB — FERRITIN: Ferritin: 14 ng/mL (ref 11–307)

## 2018-01-06 NOTE — Telephone Encounter (Signed)
Patient is schedule 01/28/18 with Dr. Gilman Schmidt

## 2018-01-07 ENCOUNTER — Other Ambulatory Visit: Payer: Medicare Other

## 2018-01-08 ENCOUNTER — Inpatient Hospital Stay (HOSPITAL_BASED_OUTPATIENT_CLINIC_OR_DEPARTMENT_OTHER): Payer: Medicare Other | Admitting: Oncology

## 2018-01-08 ENCOUNTER — Encounter: Payer: Self-pay | Admitting: Oncology

## 2018-01-08 ENCOUNTER — Other Ambulatory Visit: Payer: Self-pay

## 2018-01-08 ENCOUNTER — Inpatient Hospital Stay: Payer: Medicare Other

## 2018-01-08 VITALS — BP 160/80 | HR 97 | Temp 97.3°F | Resp 20 | Wt 212.7 lb

## 2018-01-08 VITALS — BP 154/77 | HR 81 | Resp 20

## 2018-01-08 DIAGNOSIS — D509 Iron deficiency anemia, unspecified: Secondary | ICD-10-CM | POA: Diagnosis not present

## 2018-01-08 DIAGNOSIS — E1365 Other specified diabetes mellitus with hyperglycemia: Secondary | ICD-10-CM

## 2018-01-08 DIAGNOSIS — N189 Chronic kidney disease, unspecified: Secondary | ICD-10-CM

## 2018-01-08 DIAGNOSIS — Z794 Long term (current) use of insulin: Secondary | ICD-10-CM

## 2018-01-08 DIAGNOSIS — Z79899 Other long term (current) drug therapy: Secondary | ICD-10-CM

## 2018-01-08 MED ORDER — FERUMOXYTOL INJECTION 510 MG/17 ML
INTRAVENOUS | Status: AC
  Filled 2018-01-08: qty 17

## 2018-01-08 MED ORDER — SODIUM CHLORIDE 0.9 % IV SOLN
Freq: Once | INTRAVENOUS | Status: AC
  Administered 2018-01-08: 10:00:00 via INTRAVENOUS
  Filled 2018-01-08: qty 250

## 2018-01-08 MED ORDER — SODIUM CHLORIDE 0.9 % IV SOLN
510.0000 mg | Freq: Once | INTRAVENOUS | Status: AC
  Administered 2018-01-08: 510 mg via INTRAVENOUS
  Filled 2018-01-08: qty 17

## 2018-01-08 NOTE — Patient Instructions (Signed)

## 2018-01-08 NOTE — Progress Notes (Signed)
Patient reports fatigue, dizziness and generalized pain.

## 2018-01-11 ENCOUNTER — Other Ambulatory Visit: Payer: Self-pay | Admitting: Oncology

## 2018-01-15 ENCOUNTER — Inpatient Hospital Stay: Payer: Medicare Other

## 2018-01-15 VITALS — BP 144/82 | HR 103 | Temp 96.5°F | Resp 19

## 2018-01-15 DIAGNOSIS — D509 Iron deficiency anemia, unspecified: Secondary | ICD-10-CM | POA: Diagnosis not present

## 2018-01-15 MED ORDER — SODIUM CHLORIDE 0.9 % IV SOLN
INTRAVENOUS | Status: DC
  Administered 2018-01-15: 11:00:00 via INTRAVENOUS
  Filled 2018-01-15: qty 250

## 2018-01-15 MED ORDER — SODIUM CHLORIDE 0.9 % IV SOLN
510.0000 mg | Freq: Once | INTRAVENOUS | Status: AC
  Administered 2018-01-15: 510 mg via INTRAVENOUS
  Filled 2018-01-15: qty 17

## 2018-01-20 ENCOUNTER — Encounter: Payer: Self-pay | Admitting: Emergency Medicine

## 2018-01-21 ENCOUNTER — Ambulatory Visit: Payer: Medicare Other | Admitting: Anesthesiology

## 2018-01-21 ENCOUNTER — Ambulatory Visit
Admission: RE | Admit: 2018-01-21 | Discharge: 2018-01-21 | Disposition: A | Discharge status: Home / Self Care | Payer: Medicare Other | Source: Ambulatory Visit | Attending: Gastroenterology | Admitting: Gastroenterology

## 2018-01-21 ENCOUNTER — Encounter
Admission: RE | Disposition: A | Discharge status: Home / Self Care | Payer: Self-pay | Source: Ambulatory Visit | Attending: Gastroenterology

## 2018-01-21 DIAGNOSIS — K219 Gastro-esophageal reflux disease without esophagitis: Secondary | ICD-10-CM | POA: Insufficient documentation

## 2018-01-21 DIAGNOSIS — G4730 Sleep apnea, unspecified: Secondary | ICD-10-CM | POA: Diagnosis not present

## 2018-01-21 DIAGNOSIS — K449 Diaphragmatic hernia without obstruction or gangrene: Secondary | ICD-10-CM | POA: Insufficient documentation

## 2018-01-21 DIAGNOSIS — D120 Benign neoplasm of cecum: Secondary | ICD-10-CM | POA: Insufficient documentation

## 2018-01-21 DIAGNOSIS — E119 Type 2 diabetes mellitus without complications: Secondary | ICD-10-CM | POA: Diagnosis not present

## 2018-01-21 DIAGNOSIS — Z9109 Other allergy status, other than to drugs and biological substances: Secondary | ICD-10-CM | POA: Diagnosis not present

## 2018-01-21 DIAGNOSIS — D509 Iron deficiency anemia, unspecified: Secondary | ICD-10-CM | POA: Insufficient documentation

## 2018-01-21 DIAGNOSIS — Z880 Allergy status to penicillin: Secondary | ICD-10-CM | POA: Diagnosis not present

## 2018-01-21 DIAGNOSIS — D500 Iron deficiency anemia secondary to blood loss (chronic): Secondary | ICD-10-CM | POA: Diagnosis not present

## 2018-01-21 DIAGNOSIS — K319 Disease of stomach and duodenum, unspecified: Secondary | ICD-10-CM | POA: Insufficient documentation

## 2018-01-21 DIAGNOSIS — K2950 Unspecified chronic gastritis without bleeding: Secondary | ICD-10-CM | POA: Diagnosis not present

## 2018-01-21 DIAGNOSIS — Z79899 Other long term (current) drug therapy: Secondary | ICD-10-CM | POA: Insufficient documentation

## 2018-01-21 DIAGNOSIS — K3189 Other diseases of stomach and duodenum: Secondary | ICD-10-CM

## 2018-01-21 DIAGNOSIS — Z886 Allergy status to analgesic agent status: Secondary | ICD-10-CM | POA: Insufficient documentation

## 2018-01-21 DIAGNOSIS — M1990 Unspecified osteoarthritis, unspecified site: Secondary | ICD-10-CM | POA: Insufficient documentation

## 2018-01-21 DIAGNOSIS — I10 Essential (primary) hypertension: Secondary | ICD-10-CM | POA: Insufficient documentation

## 2018-01-21 DIAGNOSIS — D122 Benign neoplasm of ascending colon: Secondary | ICD-10-CM | POA: Diagnosis not present

## 2018-01-21 DIAGNOSIS — Z96642 Presence of left artificial hip joint: Secondary | ICD-10-CM | POA: Insufficient documentation

## 2018-01-21 DIAGNOSIS — D123 Benign neoplasm of transverse colon: Secondary | ICD-10-CM | POA: Diagnosis not present

## 2018-01-21 DIAGNOSIS — Z794 Long term (current) use of insulin: Secondary | ICD-10-CM | POA: Diagnosis not present

## 2018-01-21 DIAGNOSIS — K5730 Diverticulosis of large intestine without perforation or abscess without bleeding: Secondary | ICD-10-CM | POA: Insufficient documentation

## 2018-01-21 DIAGNOSIS — J449 Chronic obstructive pulmonary disease, unspecified: Secondary | ICD-10-CM | POA: Diagnosis not present

## 2018-01-21 DIAGNOSIS — F329 Major depressive disorder, single episode, unspecified: Secondary | ICD-10-CM | POA: Insufficient documentation

## 2018-01-21 HISTORY — PX: ESOPHAGOGASTRODUODENOSCOPY (EGD) WITH PROPOFOL: SHX5813

## 2018-01-21 HISTORY — PX: COLONOSCOPY WITH PROPOFOL: SHX5780

## 2018-01-21 LAB — GLUCOSE, CAPILLARY: Glucose-Capillary: 150 mg/dL — ABNORMAL HIGH (ref 70–99)

## 2018-01-21 SURGERY — ESOPHAGOGASTRODUODENOSCOPY (EGD) WITH PROPOFOL
Anesthesia: General

## 2018-01-21 MED ORDER — LIDOCAINE HCL (PF) 1 % IJ SOLN
INTRAMUSCULAR | Status: AC
  Filled 2018-01-21: qty 2

## 2018-01-21 MED ORDER — PROPOFOL 500 MG/50ML IV EMUL
INTRAVENOUS | Status: AC
  Filled 2018-01-21: qty 50

## 2018-01-21 MED ORDER — LIDOCAINE HCL (PF) 1 % IJ SOLN: 2.0000 mL | Freq: Once | INTRAMUSCULAR | Status: DC

## 2018-01-21 MED ORDER — SODIUM CHLORIDE 0.9 % IV SOLN
INTRAVENOUS | Status: DC
  Administered 2018-01-21: 10:00:00 via INTRAVENOUS

## 2018-01-21 MED ORDER — PROPOFOL 500 MG/50ML IV EMUL
INTRAVENOUS | Status: DC | PRN
  Administered 2018-01-21: 80 ug/kg/min via INTRAVENOUS

## 2018-01-21 MED ORDER — PROPOFOL 10 MG/ML IV BOLUS
INTRAVENOUS | Status: DC | PRN
  Administered 2018-01-21: 20 mg via INTRAVENOUS
  Administered 2018-01-21: 30 mg via INTRAVENOUS
  Administered 2018-01-21: 50 mg via INTRAVENOUS

## 2018-01-21 NOTE — Op Note (Signed)
[facility] Gastroenterology Patient Name: Deanna Gay Procedure Date: 01/21/2018 10:15 AM MRN: [id number] Account #: [id number] Date of Birth: [birth year] Admit Type: Outpatient Age: 62 Room: [facility] ENDO ROOM 4 Gender: Female Note Status: Finalized Procedure:            Colonoscopy Indications:          Iron deficiency anemia, Unexplained iron deficiency                        anemia Providers:            Lin Landsman MD, MD Referring MD:         Ocie Cornfield. Ouida Sills MD, MD (Referring MD) Medicines:            Monitored Anesthesia Care Complications:        No immediate complications. Estimated blood loss: None. Procedure:            Pre-Anesthesia Assessment:                       - Prior to the procedure, a History and Physical was                        performed, and patient medications and allergies were                        reviewed. The patient is competent. The risks and                        benefits of the procedure and the sedation options and                        risks were discussed with the patient. All questions                        were answered and informed consent was obtained.                        Patient identification and proposed procedure were                        verified by the physician, the nurse, the                        anesthesiologist, the anesthetist and the technician in                        the pre-procedure area in the procedure room in the                        endoscopy suite. Mental Status Examination: alert and                        oriented. Airway Examination: normal oropharyngeal                        airway and neck mobility. Respiratory Examination:                        clear to auscultation. CV Examination: normal.  Prophylactic Antibiotics: The patient does not require                        prophylactic antibiotics. Prior Anticoagulants: The   patient has taken no previous anticoagulant or                        antiplatelet agents. ASA Grade Assessment: III - A                        patient with severe systemic disease. After reviewing                        the risks and benefits, the patient was deemed in                        satisfactory condition to undergo the procedure. The                        anesthesia plan was to use monitored anesthesia care                        (MAC). Immediately prior to administration of                        medications, the patient was re-assessed for adequacy                        to receive sedatives. The heart rate, respiratory rate,                        oxygen saturations, blood pressure, adequacy of                        pulmonary ventilation, and response to care were                        monitored throughout the procedure. The physical status                        of the patient was re-assessed after the procedure.                       After obtaining informed consent, the colonoscope was                        passed under direct vision. Throughout the procedure,                        the patient's blood pressure, pulse, and oxygen                        saturations were monitored continuously. The                        Colonoscope was introduced through the anus and                        advanced to the the cecum, identified by appendiceal  orifice and ileocecal valve. The colonoscopy was                        performed without difficulty. The patient tolerated the                        procedure well. The quality of the bowel preparation                        was evaluated using the BBPS [facility] Bowel Preparation                        Scale) with scores of: Right Colon = 3, Transverse                        Colon = 3 and Left Colon = 3 (entire mucosa seen well                        with no residual staining, small fragments of stool or                         opaque liquid). The total BBPS score equals 9. Findings:      The perianal and digital rectal examinations were normal. Pertinent       negatives include normal sphincter tone and no palpable rectal lesions.      Five sessile polyps were found in the transverse colon (3), ascending       colon (1) and cecum (1). The polyps were 5 to 7 mm in size. These polyps       were removed with a hot snare. Resection and retrieval were complete.      A diminutive polyp was found in the ascending colon. The polyp was       sessile. The polyp was removed with a cold biopsy forceps. Resection and       retrieval were complete.      A few diverticula were found in the sigmoid colon.      The retroflexed view of the distal rectum and anal verge was normal and       showed no anal or rectal abnormalities. Impression:           - Five 5 to 7 mm polyps in the transverse colon, in the                        ascending colon and in the cecum, removed with a hot                        snare. Resected and retrieved.                       - One diminutive polyp in the ascending colon, removed                        with a cold biopsy forceps. Resected and retrieved.                       - Diverticulosis in the sigmoid colon.                       -  The distal rectum and anal verge are normal on                        retroflexion view. Recommendation:       - Discharge patient to home (with escort).                       - Resume previous diet.                       - Continue present medications.                       - Await pathology results.                       - Repeat colonoscopy in 3 years for surveillance of                        multiple polyps.                       - Return to my office as previously scheduled.                       - To visualize the small bowel, perform video capsule                        endoscopy at appointment to be scheduled. Procedure Code(s):    ---  Professional ---                       [phone], Colonoscopy, flexible; with removal of tumor(s),                        polyp(s), or other lesion(s) by snare technique                       45380, 8, Colonoscopy, flexible; with biopsy, single                        or multiple Diagnosis Code(s):    --- Professional ---                       D12.3, Benign neoplasm of transverse colon (hepatic                        flexure or splenic flexure)                       D12.2, Benign neoplasm of ascending colon                       D12.0, Benign neoplasm of cecum                       D50.9, Iron deficiency anemia, unspecified                       K57.30, Diverticulosis of large intestine without                        perforation or abscess without bleeding CPT copyright 2017 American  Medical Association. All rights reserved. The codes documented in this report are preliminary and upon coder review may  be revised to meet current compliance requirements. Dr. Ulyess Mort Lin Landsman MD, MD 01/21/2018 10:59:46 AM This report has been signed electronically. Number of Addenda: 0 Note Initiated On: 01/21/2018 10:15 AM Scope Withdrawal Time: 0 hours 20 minutes 38 seconds  Total Procedure Duration: 0 hours 23 minutes 25 seconds       [facility]

## 2018-01-21 NOTE — H&P (Signed)
Cephas Darby, MD [address]  [place]  [place], [place] [address]  Main: [phone]  Fax: [phone] Pager: [phone]  Primary Care Physician:  Kirk Ruths, MD Primary Gastroenterologist:  Dr. Cephas Darby  Pre-Procedure History & Physical: HPI:  Deanna Gay is a 62 y.o. female is here for an endoscopy and colonoscopy.   Past Medical History:  Diagnosis Date  . Arthritis   . Cancer ([place])    cervical  . COPD (chronic obstructive pulmonary disease) ([place])   . Depression   . Diabetes mellitus without complication ([place])   . GERD (gastroesophageal reflux disease)   . Hypertension   . Schizophrenia ([place])   . Sleep apnea     Past Surgical History:  Procedure Laterality Date  . APPENDECTOMY    . COLONOSCOPY WITH PROPOFOL N/A 12/25/2014   Procedure: COLONOSCOPY WITH PROPOFOL;  Surgeon: Josefine Class, MD;  Location[facility] ENDOSCOPY;  Service: Endoscopy;  Laterality: N/A;  . ESOPHAGOGASTRODUODENOSCOPY (EGD) WITH PROPOFOL N/A 12/25/2014   Procedure: ESOPHAGOGASTRODUODENOSCOPY (EGD) WITH PROPOFOL;  Surgeon: Josefine Class, MD;  Location: [facility] ENDOSCOPY;  Service: Endoscopy;  Laterality: N/A;  . JOINT REPLACEMENT     right hip  . TOTAL HIP ARTHROPLASTY Left 05/16/2016   Procedure: LEFT TOTAL HIP ARTHROPLASTY ANTERIOR APPROACH;  Surgeon: Mcarthur Rossetti, MD;  Location: [facility] ORS;  Service: Orthopedics;  Laterality: Left;    Prior to Admission medications   Medication Sig Start Date End Date Taking? Authorizing Provider  ACCU-CHEK AVIVA PLUS test strip use 1 TEST STRIP to TEST BLOOD SUGAR four times a day 12/17/17   [provider]  albuterol (PROVENTIL HFA;VENTOLIN HFA) 108 (90 BASE) MCG/ACT inhaler Inhale 2 puffs into the lungs every 4 (four) hours as needed for wheezing or shortness of breath.     [provider]  aspirin 81 MG chewable tablet Chew 1 tablet (81 mg total) by mouth 2 (two) times daily. Patient not taking:  Reported on 12/25/2017 05/18/16   Mcarthur Rossetti, MD  Blood Glucose Monitoring Suppl (ACCU-CHEK AVIVA PLUS) w/Device KIT See admin instructions. 12/19/17   [provider]  budesonide-formoterol (SYMBICORT) 160-4.5 MCG/ACT inhaler Inhale 2 puffs into the lungs 2 (two) times daily.    [provider]  Continuous Blood Gluc Receiver (FREESTYLE LIBRE 14 DAY READER) DEVI Use 1 applicator 4 (four) times daily 12/07/17   [provider]  diltiazem (CARDIZEM CD) 180 MG 24 hr capsule Take 180 mg by mouth at bedtime.     [provider]  FERREX 150 150 MG capsule TAKE 1 CAPSULE BY MOUTH TWICE A DAY 01/11/18   Lloyd Huger, MD  folic acid (FOLVITE) 1 MG tablet Take 1 tablet (1 mg total) by mouth daily. 12/29/17 01/28/18  Lin Landsman, MD  hydrochlorothiazide (HYDRODIURIL) 25 MG tablet take 1 tablet by mouth every morning 01/27/13   [provider]  insulin glargine (LANTUS) 100 UNIT/ML injection Inject 0.1 mLs (10 Units total) into the skin at bedtime. 11/19/17   Bettey Costa, MD  insulin lispro (HUMALOG) 100 UNIT/ML injection Please check your sugars before each meal and use lispro per the below sliding scale:  For sugars 151-200: take 2 units For 201-250: take 4units For 251-300: take 6 units For 301-350: take 8 units For 351-400: take 10 units For > 401: call MD 07/20/17   Gladstone Lighter, MD  Insulin Syringe-Needle U-100 (BD INSULIN SYRINGE ULTRAFINE) 31G X 5/16" 0.5 ML MISC use as directed  once daily 06/25/17   [provider]  ipratropium-albuterol (DUONEB) 0.5-2.5 (3) MG/3ML SOLN Take 3 mLs by nebulization every 6 (six) hours as needed.    [provider]  nicotine (NICODERM CQ - DOSED IN MG/24 HOURS) 21 mg/24hr patch Place 1 patch onto the skin daily. 08/07/17   [provider]  nitroGLYCERIN (NITROSTAT) 0.4 MG SL tablet Place 1 tablet under the tongue every 5 (five) minutes as needed. 08/17/17   [provider]   omeprazole (PRILOSEC) 20 MG capsule Take 20 mg by mouth 2 (two) times daily before a meal.  03/29/16   [provider]  oxyCODONE-acetaminophen (PERCOCET) 7.5-325 MG tablet Take 1-2 tablets by mouth every 6 (six) hours as needed for severe pain. 05/18/16   Mcarthur Rossetti, MD  triamcinolone (KENALOG) 0.1 % paste Use as directed 1 application in the mouth or throat daily. 02/23/15   [provider]  triamcinolone cream (KENALOG) 0.1 % Apply 1 application topically 2 (two) times daily.    [provider]    Allergies as of 12/29/2017 - Review Complete 12/29/2017  Allergen Reaction Noted  . Aspirin Other (See Comments) 11/26/2016  . Iodine Swelling 12/22/2014  . Penicillins Swelling 12/25/2014    Family History  Problem Relation Age of Onset  . Hypertension Mother   . Diabetes Mellitus II Mother   . Breast cancer Neg Hx   . Kidney cancer Neg Hx   . Bladder Cancer Neg Hx     Social History   Socioeconomic History  . Marital status: Divorced    Spouse name: Not on file  . Number of children: Not on file  . Years of education: Not on file  . Highest education level: Not on file  Occupational History  . Not on file  Social Needs  . Financial resource strain: Not on file  . Food insecurity:    Worry: Not on file    Inability: Not on file  . Transportation needs:    Medical: Not on file    Non-medical: Not on file  Tobacco Use  . Smoking status: Current Every Day Smoker    Packs/day: 1.00    Years: 47.00    Pack years: 47.00  . Smokeless tobacco: Never Used  Substance and Sexual Activity  . Alcohol use: No  . Drug use: No  . Sexual activity: Not on file  Lifestyle  . Physical activity:    Days per week: Not on file    Minutes per session: Not on file  . Stress: Not on file  Relationships  . Social connections:    Talks on phone: Not on file    Gets together: Not on file    Attends religious service: Not on file    Active member of  club or organization: Not on file    Attends meetings of clubs or organizations: Not on file    Relationship status: Not on file  . Intimate partner violence:    Fear of current or ex partner: Not on file    Emotionally abused: Not on file    Physically abused: Not on file    Forced sexual activity: Not on file  Other Topics Concern  . Not on file  Social History Narrative  . Not on file    Review of Systems: See HPI, otherwise negative ROS  Physical Exam: BP (!) 137/93   Pulse (!) 115   Temp (!) 96.9 F (36.1 C) (Tympanic)   Resp 16  Ht 5' 4"  (1.626 m)   Wt 92.5 kg   LMP  (LMP Unknown) Comment: postmenopausal  SpO2 95%   BMI 35.02 kg/m  General:   Alert,  pleasant and cooperative in NAD Head:  Normocephalic and atraumatic. Neck:  Supple; no masses or thyromegaly. Lungs:  Clear throughout to auscultation.    Heart:  Regular rate and rhythm. Abdomen:  Soft, nontender and nondistended. Normal bowel sounds, without guarding, and without rebound.   Neurologic:  Alert and  oriented x4;  grossly normal neurologically.  Impression/Plan: Deanna Gay is here for an endoscopy and colonoscopy to be performed for chronic IDA  Risks, benefits, limitations, and alternatives regarding  endoscopy and colonoscopy have been reviewed with the patient.  Questions have been answered.  All parties agreeable.   Sherri Sear, MD  01/21/2018, 9:57 AM

## 2018-01-21 NOTE — Op Note (Signed)
[facility]enter[facility] Patient Name: Deanna Gay Procedure Date: 01/21/2018 10:15 AM MRN: [id number] Account #: [id number] Date of Birth: May 13, 1956 Admit Type: Outpatient Age: 62 Room: [facility] ENDO ROOM 4 Gender: Female Note Status: Finalized Procedure:            Upper GI endoscopy Indications:          Unexplained iron deficiency anemia Providers:            Lin Landsman MD, MD Referring MD:         Ocie Cornfield. Ouida Sills MD, MD (Referring MD) Medicines:            Monitored Anesthesia Care Complications:        No immediate complications. Estimated blood loss:                        Minimal. Procedure:            Pre-Anesthesia Assessment:                       - Prior to the procedure, a History and Physical was                        performed, and patient medications and allergies were                        reviewed. The patient is competent. The risks and                        benefits of the procedure and the sedation options and                        risks were discussed with the patient. All questions                        were answered and informed consent was obtained.                        Patient identification and proposed procedure were                        verified by the physician, the nurse, the                        anesthesiologist, the anesthetist and the technician in                        the pre-procedure area in the procedure room in the                        endoscopy suite. Mental Status Examination: alert and                        oriented. Airway Examination: normal oropharyngeal                        airway and neck mobility. Respiratory Examination:                        clear to auscultation. CV Examination: normal.  Prophylactic Antibiotics: The patient does not require                        prophylactic antibiotics. Prior Anticoagulants: The                        patient has taken  no previous anticoagulant or                        antiplatelet agents. ASA Grade Assessment: III - A                        patient with severe systemic disease. After reviewing                        the risks and benefits, the patient was deemed in                        satisfactory condition to undergo the procedure. The                        anesthesia plan was to use monitored anesthesia care                        (MAC). Immediately prior to administration of                        medications, the patient was re-assessed for adequacy                        to receive sedatives. The heart rate, respiratory rate,                        oxygen saturations, blood pressure, adequacy of                        pulmonary ventilation, and response to care were                        monitored throughout the procedure. The physical status                        of the patient was re-assessed after the procedure.                       After obtaining informed consent, the endoscope was                        passed under direct vision. Throughout the procedure,                        the patient's blood pressure, pulse, and oxygen                        saturations were monitored continuously. The Endoscope                        was introduced through the mouth, and advanced to the  second part of duodenum. The upper GI endoscopy was                        accomplished without difficulty. The patient tolerated                        the procedure fairly well. Findings:      The duodenal bulb and second portion of the duodenum were normal.      A single 8 mm papule (nodule) with no bleeding and no stigmata of recent       bleeding was found on the greater curvature of the gastric body.       Biopsies were taken with a cold forceps for histology.      A few dispersed, diminutive non-bleeding erosions were found in the       gastric body. There were no stigmata of  recent bleeding.      The gastric fundus, incisura, gastric antrum and prepyloric region of       the stomach were normal. Biopsies were taken with a cold forceps for       Helicobacter pylori testing.      The gastroesophageal junction and examined esophagus were normal.      A small hiatal hernia was present. Impression:           - Normal duodenal bulb and second portion of the                        duodenum.                       - A single papule (nodule) found in the stomach.                        Biopsied.                       - Non-bleeding erosive gastropathy.                       - Normal gastric fundus, incisura, antrum and                        prepyloric region of the stomach. Biopsied.                       - Normal gastroesophageal junction and esophagus.                       - Small hiatal hernia. Recommendation:       - Await pathology results.                       - Proceed with colonoscopy as scheduled                       See colonoscopy report Procedure Code(s):    --- Professional ---                       [phone], Esophagogastroduodenoscopy, flexible, transoral;                        with biopsy, single or multiple Diagnosis Code(s):    --- Professional ---  K31.89, Other diseases of stomach and duodenum                       K44.9, Diaphragmatic hernia without obstruction or                        gangrene                       D50.9, Iron deficiency anemia, unspecified CPT copyright 2017 American Medical Association. All rights reserved. The codes documented in this report are preliminary and upon coder review may  be revised to meet current compliance requirements. Dr. Ulyess Mort Lin Landsman MD, MD 01/21/2018 10:30:16 AM This report has been signed electronically. Number of Addenda: 0 Note Initiated On: 01/21/2018 10:15 AM      [facility]

## 2018-01-21 NOTE — Progress Notes (Signed)
Wanting to go home upon arrival from intraprocedure room. Pulled off EKG leads, but alert and oriented. States "I am going to drive just as soon as I leave here" after instructed not to drive. Mother with patient and driving patient at discharge

## 2018-01-21 NOTE — Anesthesia Preprocedure Evaluation (Signed)
Anesthesia Evaluation  Patient identified by MRN, date of birth, ID band Patient awake    Reviewed: Allergy & Precautions, NPO status , Patient's Chart, lab work & pertinent test results  History of Anesthesia Complications Negative for: history of anesthetic complications  Airway Mallampati: III       Dental  (+) Edentulous Upper, Edentulous Lower   Pulmonary sleep apnea, Continuous Positive Airway Pressure Ventilation and Oxygen sleep apnea , COPD,  COPD inhaler and oxygen dependent, Current Smoker,           Cardiovascular hypertension, Pt. on medications (-) Past MI and (-) CHF (-) dysrhythmias (-) Valvular Problems/Murmurs     Neuro/Psych neg Seizures Depression Schizophrenia    GI/Hepatic Neg liver ROS, GERD  Medicated and Controlled,  Endo/Other  diabetes, Type 2, Oral Hypoglycemic Agents, Insulin Dependent  Renal/GU Renal InsufficiencyRenal disease     Musculoskeletal   Abdominal   Peds  Hematology   Anesthesia Other Findings   Reproductive/Obstetrics                             Anesthesia Physical Anesthesia Plan  ASA: III  Anesthesia Plan: General   Post-op Pain Management:    Induction: Intravenous  PONV Risk Score and Plan: 2 and Propofol infusion and TIVA  Airway Management Planned: Nasal Cannula  Additional Equipment:   Intra-op Plan:   Post-operative Plan:   Informed Consent: I have reviewed the patients History and Physical, chart, labs and discussed the procedure including the risks, benefits and alternatives for the proposed anesthesia with the patient or authorized representative who has indicated his/her understanding and acceptance.     Plan Discussed with:   Anesthesia Plan Comments:         Anesthesia Quick Evaluation

## 2018-01-21 NOTE — Anesthesia Postprocedure Evaluation (Signed)
Anesthesia Post Note  Patient: Deanna Gay  Procedure(s) Performed: ESOPHAGOGASTRODUODENOSCOPY (EGD) WITH PROPOFOL (N/A ) COLONOSCOPY WITH PROPOFOL (N/A )  Patient location during evaluation: Endoscopy Anesthesia Type: General Level of consciousness: awake and alert Pain management: pain level controlled Vital Signs Assessment: post-procedure vital signs reviewed and stable Respiratory status: spontaneous breathing and respiratory function stable Cardiovascular status: stable Anesthetic complications: no     Last Vitals:  Vitals:   01/21/18 0939 01/21/18 1102  BP: (!) 137/93 127/86  Pulse: (!) 115 (!) 108  Resp: 16 (!) 23  Temp: (!) 36.1 C 36.4 C  SpO2: 95% 95%    Last Pain:  Vitals:   01/21/18 1102  TempSrc: Tympanic  PainSc:                  KEPHART,WILLIAM K

## 2018-01-21 NOTE — Anesthesia Post-op Follow-up Note (Signed)
Anesthesia QCDR form completed.        

## 2018-01-21 NOTE — Transfer of Care (Signed)
Immediate Anesthesia Transfer of Care Note  Patient: Deanna Gay  Procedure(s) Performed: ESOPHAGOGASTRODUODENOSCOPY (EGD) WITH PROPOFOL (N/A ) COLONOSCOPY WITH PROPOFOL (N/A )  Patient Location: PACU  Anesthesia Type:General  Level of Consciousness: awake  Airway & Oxygen Therapy: Patient Spontanous Breathing  Post-op Assessment: Report given to RN  Post vital signs: stable  Last Vitals:  Vitals Value Taken Time  BP 127/86 01/21/2018 11:02 AM  Temp 36.4 C 01/21/2018 11:02 AM  Pulse 102 01/21/2018 11:05 AM  Resp 19 01/21/2018 11:05 AM  SpO2 94 % 01/21/2018 11:05 AM  Vitals shown include unvalidated device data.  Last Pain:  Vitals:   01/21/18 1102  TempSrc: Tympanic  PainSc:          Complications: No apparent anesthesia complications

## 2018-01-21 NOTE — Progress Notes (Signed)
IV in right hand removed. No redness.

## 2018-01-22 ENCOUNTER — Encounter: Payer: Self-pay | Admitting: Gastroenterology

## 2018-01-22 LAB — SURGICAL PATHOLOGY

## 2018-01-26 ENCOUNTER — Encounter: Payer: Self-pay | Admitting: Gastroenterology

## 2018-01-26 ENCOUNTER — Other Ambulatory Visit: Payer: Self-pay

## 2018-01-26 ENCOUNTER — Ambulatory Visit (INDEPENDENT_AMBULATORY_CARE_PROVIDER_SITE_OTHER): Payer: Medicare Other | Admitting: Gastroenterology

## 2018-01-26 VITALS — BP 146/76 | HR 86 | Resp 17 | Wt 211.2 lb

## 2018-01-26 DIAGNOSIS — D529 Folate deficiency anemia, unspecified: Secondary | ICD-10-CM | POA: Insufficient documentation

## 2018-01-26 DIAGNOSIS — D500 Iron deficiency anemia secondary to blood loss (chronic): Secondary | ICD-10-CM

## 2018-01-26 DIAGNOSIS — D528 Other folate deficiency anemias: Secondary | ICD-10-CM

## 2018-01-26 DIAGNOSIS — E538 Deficiency of other specified B group vitamins: Secondary | ICD-10-CM | POA: Diagnosis not present

## 2018-01-26 MED ORDER — FOLIC ACID 1 MG PO TABS: 1.0000 mg | ORAL_TABLET | Freq: Every day | ORAL | 0 refills | Status: DC

## 2018-01-26 NOTE — Progress Notes (Addendum)
Deanna Darby, MD [address]  [place]  [place], [place] [address]  Main: [phone]  Fax: [phone]    Gastroenterology Consultation  Referring Provider:     Kirk Ruths, MD Primary Care Physician:  Kirk Ruths, MD Primary Gastroenterologist:  Dr. Cephas Gay Reason for Consultation:  iron deficiency anemia        HPI:   Deanna Gay is a 62 y.o. African-American female referred by Dr. Ouida Sills, Ocie Cornfield, MD  for consultation & management of chronic iron deficiency anemia. She has history of diabetes, chronic tobacco use, COPD who was  admitted to [facility] on 10/29/2017 to 11/19/2017 secondary to UTI sepsis from Escherichia coli bacteremia that resulted in acute renal failure. She required temporary hemodialysis during that admission. She also developed acute on chronic normocytic anemia. Her last normal hemoglobin was 12.9 in 05/2017, lowest hemoglobin 7.4 during that admission. Patient has been followed by Dr. Cranford Mon at [facility] for iron deficiency anemia patient received blood transfusion on 12/14/17 and her hemoglobin responded appropriately. Patient has history of chronic iron deficiency anemia, her ferritin was 2 in 02/2015 and underwent EGD and colonoscopy by Dr Rayann Heman and no source of bleeding was identified. Patient reports taking Aleve one to 2 tablets a few times a week for chronic muscular skeletal pain. Most recent ferritin is 31 and folate is low at 5, B12 298. Patient has been receiving IV iron as needed at [facility]. Patient reports intermittent black stools. She denies rectal bleeding She denies chest pain, abdominal pain, shortness of breath, nausea or vomiting  Follow up visit 01/26/2018: She reports that she has stopped taking Aleve. She is not experiencing melena. She underwent EGD with gastric biopsies, unremarkable. Colonoscopy revealed multiple subcentimeter tubular adenomas without  evidence of high-grade dysplasia. I started her on folate due to folate deficiency. She continues to be iron deficient. She continues to smoke cigarettes.   NSAIDs: Aleve 1-2 tablets daily  Antiplts/Anticoagulants/Anti thrombotics: none  GI Procedures:  EGD 01/21/2018 - Normal duodenal bulb and second portion of the duodenum. - A single papule (nodule) found in the stomach. Biopsied. - Non-bleeding erosive gastropathy. - Normal gastric fundus, incisura, antrum and prepyloric region of the stomach. Biopsied. - Normal gastroesophageal junction and esophagus. - Small hiatal hernia.  Colonoscopy 01/21/2018 - Five 5 to 7 mm polyps in the transverse colon, in the ascending colon and in the cecum, removed with a hot snare. Resected and retrieved. - One diminutive polyp in the ascending colon, removed with a cold biopsy forceps. Resected and retrieved. - Diverticulosis in the sigmoid colon. - The distal rectum and anal verge are normal on retroflexion view.  DIAGNOSIS:  A. STOMACH, RANDOM; COLD BIOPSY:  - ANTRAL MUCOSA WITH MILD CHRONIC AND FOCAL REACTIVE GASTRITIS.  - OXYNTIC MUCOSA WITH MILD CHRONIC GASTRITIS AND CHANGES CONSISTENT WITH  PROTON PUMP INHIBITOR USE.  - NEGATIVE FOR H. PYLORI, DYSPLASIA, AND MALIGNANCY.   B. STOMACH NODULE, GREATER CURVATURE; COLD BIOPSY:  - OXYNTIC MUCOSA WITH CHANGES SUGGESTIVE OF EARLY FUNDIC GLAND POLYP.  - NEGATIVE FOR H. PYLORI, DYSPLASIA, AND MALIGNANCY.   C. COLON POLYP, CECUM; HOT SNARE:  - TUBULAR ADENOMA.  - NEGATIVE FOR HIGH-GRADE DYSPLASIA AND MALIGNANCY.   D. COLON POLYP X2, ASCENDING; COLD BIOPSY AND HOT SNARE:  - TUBULAR ADENOMA (MULTIPLE).  - NEGATIVE FOR HIGH-GRADE DYSPLASIA AND MALIGNANCY.   E. COLON POLYP X3, TRANSVERSE; HOT SNARE:  - TUBULAR ADENOMA (MULTIPLE).  -  NEGATIVE FOR HIGH-GRADE DYSPLASIA AND MALIGNANCY.   EGD in 2016 - White nummular lesions in esophageal mucosa. Biopsied. - Normal stomach. - Normal examined  duodenum.  Colonoscopy 12/25/2014 - Diverticulosis in the sigmoid colon. - The examination was otherwise normal on direct and retroflexion views. - No specimens collected.  Past Medical History:  Diagnosis Date  . Arthritis   . Cancer ([place])    cervical  . COPD (chronic obstructive pulmonary disease) ([place])   . Depression   . Diabetes mellitus without complication ([place])   . GERD (gastroesophageal reflux disease)   . Hypertension   . Schizophrenia ([place])   . Sleep apnea     Past Surgical History:  Procedure Laterality Date  . APPENDECTOMY    . COLONOSCOPY WITH PROPOFOL N/A 12/25/2014   Procedure: COLONOSCOPY WITH PROPOFOL;  Surgeon: Josefine Class, MD;  Location[facility] ENDOSCOPY;  Service: Endoscopy;  Laterality: N/A;  . COLONOSCOPY WITH PROPOFOL N/A 01/21/2018   Procedure: COLONOSCOPY WITH PROPOFOL;  Surgeon: Lin Landsman, MD;  Location: [facility] ENDOSCOPY;  Service: Gastroenterology;  Laterality: N/A;  . ESOPHAGOGASTRODUODENOSCOPY (EGD) WITH PROPOFOL N/A 12/25/2014   Procedure: ESOPHAGOGASTRODUODENOSCOPY (EGD) WITH PROPOFOL;  Surgeon: Josefine Class, MD;  Location: [facility] ENDOSCOPY;  Service: Endoscopy;  Laterality: N/A;  . ESOPHAGOGASTRODUODENOSCOPY (EGD) WITH PROPOFOL N/A 01/21/2018   Procedure: ESOPHAGOGASTRODUODENOSCOPY (EGD) WITH PROPOFOL;  Surgeon: Lin Landsman, MD;  Location: [facility] ENDOSCOPY;  Service: Gastroenterology;  Laterality: N/A;  . JOINT REPLACEMENT     right hip  . TOTAL HIP ARTHROPLASTY Left 05/16/2016   Procedure: LEFT TOTAL HIP ARTHROPLASTY ANTERIOR APPROACH;  Surgeon: Mcarthur Rossetti, MD;  Location: [facility] ORS;  Service: Orthopedics;  Laterality: Left;    Current Outpatient Medications:  .  ACCU-CHEK AVIVA PLUS test strip, use 1 TEST STRIP to TEST BLOOD SUGAR four times a day, Disp: , Rfl: 0 .  albuterol (PROVENTIL HFA;VENTOLIN HFA) 108 (90 BASE) MCG/ACT inhaler, Inhale 2 puffs into the lungs every 4 (four) hours as needed for wheezing or  shortness of breath. , Disp: , Rfl:  .  aspirin 81 MG chewable tablet, Chew 1 tablet (81 mg total) by mouth 2 (two) times daily., Disp: 60 tablet, Rfl: 0 .  Blood Glucose Monitoring Suppl (ACCU-CHEK AVIVA PLUS) w/Device KIT, See admin instructions., Disp: , Rfl: 0 .  budesonide-formoterol (SYMBICORT) 160-4.5 MCG/ACT inhaler, Inhale 2 puffs into the lungs 2 (two) times daily., Disp: , Rfl:  .  Continuous Blood Gluc Receiver (FREESTYLE LIBRE 14 DAY READER) DEVI, Use 1 applicator 4 (four) times daily, Disp: , Rfl:  .  diltiazem (CARDIZEM CD) 180 MG 24 hr capsule, Take 180 mg by mouth at bedtime. , Disp: , Rfl:  .  FERREX 150 150 MG capsule, TAKE 1 CAPSULE BY MOUTH TWICE A DAY, Disp: 200 capsule, Rfl: 0 .  folic acid (FOLVITE) 1 MG tablet, Take 1 tablet (1 mg total) by mouth daily., Disp: 30 tablet, Rfl: 0 .  hydrochlorothiazide (HYDRODIURIL) 25 MG tablet, take 1 tablet by mouth every morning, Disp: , Rfl:  .  insulin glargine (LANTUS) 100 UNIT/ML injection, Inject 0.1 mLs (10 Units total) into the skin at bedtime., Disp: 10 mL, Rfl: 11 .  insulin lispro (HUMALOG) 100 UNIT/ML injection, Please check your sugars before each meal and use lispro per the below sliding scale:  For sugars 151-200: take 2 units For 201-250: take 4units For 251-300: take 6 units For 301-350: take 8 units For 351-400: take 10 units For > 401: call MD,  Disp: 10 mL, Rfl: 11 .  Insulin Syringe-Needle U-100 (BD INSULIN SYRINGE ULTRAFINE) 31G X 5/16" 0.5 ML MISC, use as directed once daily, Disp: , Rfl:  .  ipratropium-albuterol (DUONEB) 0.5-2.5 (3) MG/3ML SOLN, Take 3 mLs by nebulization every 6 (six) hours as needed., Disp: , Rfl:  .  nicotine (NICODERM CQ - DOSED IN MG/24 HOURS) 21 mg/24hr patch, Place 1 patch onto the skin daily., Disp: , Rfl: 0 .  nitroGLYCERIN (NITROSTAT) 0.4 MG SL tablet, Place 1 tablet under the tongue every 5 (five) minutes as needed., Disp: , Rfl: 0 .  omeprazole (PRILOSEC) 20 MG capsule, Take 20 mg by mouth  2 (two) times daily before a meal. , Disp: , Rfl: 0 .  oxyCODONE-acetaminophen (PERCOCET) 7.5-325 MG tablet, Take 1-2 tablets by mouth every 6 (six) hours as needed for severe pain., Disp: 30 tablet, Rfl: 0 .  triamcinolone (KENALOG) 0.1 % paste, Use as directed 1 application in the mouth or throat daily., Disp: , Rfl: 1 .  triamcinolone cream (KENALOG) 0.1 %, Apply 1 application topically 2 (two) times daily., Disp: , Rfl:  .  QUEtiapine (SEROQUEL) 50 MG tablet, Take by mouth., Disp: , Rfl:    Family History  Problem Relation Age of Onset  . Hypertension Mother   . Diabetes Mellitus II Mother   . Breast cancer Neg Hx   . Kidney cancer Neg Hx   . Bladder Cancer Neg Hx      Social History   Tobacco Use  . Smoking status: Current Every Day Smoker    Packs/day: 1.00    Years: 47.00    Pack years: 47.00  . Smokeless tobacco: Never Used  Substance Use Topics  . Alcohol use: No  . Drug use: No    Allergies as of 01/26/2018 - Review Complete 01/26/2018  Allergen Reaction Noted  . Aspirin Other (See Comments) 11/26/2016  . Iodine Swelling 12/22/2014  . Penicillins Swelling 12/25/2014    Review of Systems:    All systems reviewed and negative except where noted in HPI.   Physical Exam:  BP (!) 146/76 (BP Location: Left Arm, Patient Position: Sitting, Cuff Size: Large)   Pulse 86   Resp 17   Wt 211 lb 3.2 oz (95.8 kg)   LMP  (LMP Unknown) Comment: postmenopausal  BMI 36.25 kg/m  No LMP recorded (lmp unknown). Patient is postmenopausal.  General:   Alert,  Well-developed, well-nourished, pleasant and cooperative in NAD Head:  Normocephalic and atraumatic. Eyes:  Sclera clear, no icterus.   Conjunctiva pink. Ears:  Normal auditory acuity. Nose:  No deformity, discharge, or lesions. Mouth:  No deformity or lesions,oropharynx pink & moist. Neck:  Supple; no masses or thyromegaly. Lungs:  Respirations even and unlabored.  Clear throughout to auscultation.   No wheezes,  crackles, or rhonchi. No acute distress. Heart:  Regular rate and rhythm; no murmurs, clicks, rubs, or gallops. Abdomen:  Normal bowel sounds. Soft, non-tender and non-distended without masses, hepatosplenomegaly or hernias noted.  No guarding or rebound tenderness.   Rectal: Not performed Msk:  Symmetrical without gross deformities. Good, equal movement & strength bilaterally. Pulses:  Normal pulses noted. Extremities:  No clubbing or edema.  No cyanosis. Neurologic:  Alert and oriented x3;  grossly normal neurologically. Skin:  Intact without significant lesions or rashes. No jaundice. Lymph Nodes:  No significant cervical adenopathy. Psych:  Alert and cooperative. Normal mood and affect.  Imaging Studies: Reviewed  Assessment and Plan:   Deanna Gay is a 62 y.o. African-American female with history of diabetes, metabolic syndrome, chronic iron deficiency anemia of unclear etiology, Escherichia coli bacteremia resulting in acute renal failure, temporary hemodialysis seen for follow-up of chronic iron deficiency anemia. Patient underwent EGD and colonoscopy in 2016 and no bleeding source was identified. She does report history of chronic intermittent NSAID use and intermittent black stools. Currently, she stopped NSAID use. She underwent repeat upper endoscopy and colonoscopy which did not reveal source of iron deficiency anemia. She does have folate deficiency  Recommend video capsule endoscopy to evaluate for iron deficiency anemia Follow-up with Dr. Grayland Ormond at [place] for parenteral iron therapy continue oral folate 1 mg daily for folate deficiency Continue to avoid NSAIDs Suggested her to decrease omeprazole 20 mg to once a day for one month and then stop as there is no indication for her to stay on long-term PPI  History of tubular adenomas of colon: Recommend repeat colonoscopy in 01/2021   Follow up in 3 months   Deanna Darby, MD

## 2018-01-28 ENCOUNTER — Other Ambulatory Visit (HOSPITAL_COMMUNITY)
Admission: RE | Admit: 2018-01-28 | Discharge: 2018-01-28 | Disposition: A | Discharge status: Home / Self Care | Payer: Medicare Other | Source: Ambulatory Visit | Attending: Obstetrics and Gynecology | Admitting: Obstetrics and Gynecology

## 2018-01-28 ENCOUNTER — Ambulatory Visit (INDEPENDENT_AMBULATORY_CARE_PROVIDER_SITE_OTHER): Payer: Medicare Other | Admitting: Obstetrics and Gynecology

## 2018-01-28 ENCOUNTER — Encounter: Payer: Self-pay | Admitting: Obstetrics and Gynecology

## 2018-01-28 VITALS — BP 132/78 | HR 91 | Ht 65.0 in | Wt 210.0 lb

## 2018-01-28 DIAGNOSIS — Z13820 Encounter for screening for osteoporosis: Secondary | ICD-10-CM | POA: Diagnosis not present

## 2018-01-28 DIAGNOSIS — Z79899 Other long term (current) drug therapy: Secondary | ICD-10-CM | POA: Insufficient documentation

## 2018-01-28 DIAGNOSIS — I10 Essential (primary) hypertension: Secondary | ICD-10-CM | POA: Insufficient documentation

## 2018-01-28 DIAGNOSIS — M810 Age-related osteoporosis without current pathological fracture: Secondary | ICD-10-CM | POA: Insufficient documentation

## 2018-01-28 DIAGNOSIS — Z0100 Encounter for examination of eyes and vision without abnormal findings: Secondary | ICD-10-CM

## 2018-01-28 DIAGNOSIS — D649 Anemia, unspecified: Secondary | ICD-10-CM | POA: Insufficient documentation

## 2018-01-28 DIAGNOSIS — Z794 Long term (current) use of insulin: Secondary | ICD-10-CM | POA: Insufficient documentation

## 2018-01-28 DIAGNOSIS — Z0000 Encounter for general adult medical examination without abnormal findings: Secondary | ICD-10-CM | POA: Insufficient documentation

## 2018-01-28 DIAGNOSIS — Z7982 Long term (current) use of aspirin: Secondary | ICD-10-CM | POA: Diagnosis not present

## 2018-01-28 DIAGNOSIS — F209 Schizophrenia, unspecified: Secondary | ICD-10-CM | POA: Insufficient documentation

## 2018-01-28 DIAGNOSIS — Z1211 Encounter for screening for malignant neoplasm of colon: Secondary | ICD-10-CM

## 2018-01-28 DIAGNOSIS — J449 Chronic obstructive pulmonary disease, unspecified: Secondary | ICD-10-CM | POA: Insufficient documentation

## 2018-01-28 DIAGNOSIS — Z124 Encounter for screening for malignant neoplasm of cervix: Secondary | ICD-10-CM | POA: Diagnosis not present

## 2018-01-28 DIAGNOSIS — Z8541 Personal history of malignant neoplasm of cervix uteri: Secondary | ICD-10-CM | POA: Insufficient documentation

## 2018-01-28 DIAGNOSIS — E119 Type 2 diabetes mellitus without complications: Secondary | ICD-10-CM

## 2018-01-28 DIAGNOSIS — Z96643 Presence of artificial hip joint, bilateral: Secondary | ICD-10-CM | POA: Diagnosis not present

## 2018-01-28 DIAGNOSIS — Z1239 Encounter for other screening for malignant neoplasm of breast: Secondary | ICD-10-CM

## 2018-01-28 DIAGNOSIS — K219 Gastro-esophageal reflux disease without esophagitis: Secondary | ICD-10-CM | POA: Insufficient documentation

## 2018-01-28 DIAGNOSIS — M1990 Unspecified osteoarthritis, unspecified site: Secondary | ICD-10-CM | POA: Insufficient documentation

## 2018-01-28 DIAGNOSIS — F17210 Nicotine dependence, cigarettes, uncomplicated: Secondary | ICD-10-CM | POA: Insufficient documentation

## 2018-01-28 DIAGNOSIS — Z1231 Encounter for screening mammogram for malignant neoplasm of breast: Secondary | ICD-10-CM

## 2018-01-28 NOTE — Progress Notes (Signed)
Gynecology Annual Exam  PCP: Kirk Ruths, MD  Chief Complaint:  Chief Complaint  Patient presents with  . Referral    Has had cervical cancer, no procedure done, pain in LQ always there can be sharp/dull     History of Present Illness:Patient is a 62 y.o. No obstetric history on file. presents for annual exam. The patient has no complaints today.   She reports that she went to a dysplasia clinic at [place] from 1972 to 2004. I can not find records of this. But given that the patient still has her uterus and cervix it is not likely that she had cervical cancer in the past.  Her last pap smear was with Dr. Ouida Sills on 10/14/16. Result: NEGATIVE FOR INTRAEPITHELIAL LESION AND MALIGNANCY. She had a endometrial biopsy shortly afterwards and that was also negative, normal atrophic endometrium.   LMP: No LMP recorded (lmp unknown). Patient is postmenopausal. She denies postmenopausal bleeding Menarche:not applicable Duration of flow: 0 days Heavy Menses: no Clots: no Intermenstrual Bleeding: no Postcoital Bleeding: no Dysmenorrhea: no  The patient is not sexually active. She denies dyspareunia.  The patient does not perform self breast exams.  There is no notable family history of breast or ovarian cancer in her family.  The patient wears seatbelts: yes.   The patient has regular exercise: no.    The patient denies current symptoms of depression.  She states that she is not depressed but she does like gambling and plays the sweepstakes. "I'm not addicted to gambling, there just isn't anything else to do here."   Review of Systems: Review of Systems  Constitutional: Positive for malaise/fatigue. Negative for chills, fever and weight loss.  HENT: Positive for hearing loss. Negative for congestion and sinus pain.   Eyes: Positive for blurred vision. Negative for double vision.  Respiratory: Positive for shortness of breath. Negative for cough, sputum production and  wheezing.   Cardiovascular: Negative for chest pain, palpitations, orthopnea and leg swelling.  Gastrointestinal: Negative for abdominal pain, constipation, diarrhea, nausea and vomiting.  Genitourinary: Positive for frequency. Negative for dysuria, flank pain, hematuria and urgency.  Musculoskeletal: Positive for joint pain and neck pain. Negative for back pain and falls.  Skin: Negative for itching and rash.  Neurological: Negative for dizziness and headaches.  Psychiatric/Behavioral: Negative for depression, substance abuse and suicidal ideas. The patient is not nervous/anxious.     Past Medical History:  Past Medical History:  Diagnosis Date  . Anemia   . Arthritis   . Cancer ([place])    cervical  . COPD (chronic obstructive pulmonary disease) ([place])   . COPD exacerbation ([place]) 07/19/2017  . Diabetes mellitus without complication ([place])   . GERD (gastroesophageal reflux disease)   . Hypertension   . Schizophrenia ([place])   . Sleep apnea     Past Surgical History:  Past Surgical History:  Procedure Laterality Date  . APPENDECTOMY    . COLONOSCOPY WITH PROPOFOL N/A 12/25/2014   Procedure: COLONOSCOPY WITH PROPOFOL;  Surgeon: Josefine Class, MD;  Location[facility] ENDOSCOPY;  Service: Endoscopy;  Laterality: N/A;  . COLONOSCOPY WITH PROPOFOL N/A 01/21/2018   Procedure: COLONOSCOPY WITH PROPOFOL;  Surgeon: Lin Landsman, MD;  Location: [facility] ENDOSCOPY;  Service: Gastroenterology;  Laterality: N/A;  . ESOPHAGOGASTRODUODENOSCOPY (EGD) WITH PROPOFOL N/A 12/25/2014   Procedure: ESOPHAGOGASTRODUODENOSCOPY (EGD) WITH PROPOFOL;  Surgeon: Josefine Class, MD;  Location: [facility] ENDOSCOPY;  Service: Endoscopy;  Laterality: N/A;  . ESOPHAGOGASTRODUODENOSCOPY (EGD) WITH PROPOFOL  N/A 01/21/2018   Procedure: ESOPHAGOGASTRODUODENOSCOPY (EGD) WITH PROPOFOL;  Surgeon: Lin Landsman, MD;  Location: [facility] ENDOSCOPY;  Service: Gastroenterology;  Laterality: N/A;  . JOINT REPLACEMENT     right hip  .  TOTAL HIP ARTHROPLASTY Left 05/16/2016   Procedure: LEFT TOTAL HIP ARTHROPLASTY ANTERIOR APPROACH;  Surgeon: Mcarthur Rossetti, MD;  Location: [facility] ORS;  Service: Orthopedics;  Laterality: Left;    Gynecologic History:  No LMP recorded (lmp unknown). Patient is postmenopausal. Last Pap: Results were: 2018  no abnormalities  Last mammogram: 2017 Results were: BI-RAD I  Obstetric History: No obstetric history on file.  Family History:  Family History  Problem Relation Age of Onset  . Hypertension Mother   . Diabetes Mellitus II Mother   . Breast cancer Neg Hx   . Kidney cancer Neg Hx   . Bladder Cancer Neg Hx     Social History:  Social History   Socioeconomic History  . Marital status: Divorced    Spouse name: Not on file  . Number of children: Not on file  . Years of education: Not on file  . Highest education level: Not on file  Occupational History  . Not on file  Social Needs  . Financial resource strain: Not on file  . Food insecurity:    Worry: Not on file    Inability: Not on file  . Transportation needs:    Medical: Not on file    Non-medical: Not on file  Tobacco Use  . Smoking status: Current Every Day Smoker    Packs/day: 1.00    Years: 47.00    Pack years: 47.00    Types: Cigarettes  . Smokeless tobacco: Never Used  Substance and Sexual Activity  . Alcohol use: No  . Drug use: No  . Sexual activity: Not Currently    Birth control/protection: Post-menopausal  Lifestyle  . Physical activity:    Days per week: Not on file    Minutes per session: Not on file  . Stress: Not on file  Relationships  . Social connections:    Talks on phone: Not on file    Gets together: Not on file    Attends religious service: Not on file    Active member of club or organization: Not on file    Attends meetings of clubs or organizations: Not on file    Relationship status: Not on file  . Intimate partner violence:    Fear of current or ex partner: Not on file      Emotionally abused: Not on file    Physically abused: Not on file    Forced sexual activity: Not on file  Other Topics Concern  . Not on file  Social History Narrative  . Not on file    Allergies:  Allergies  Allergen Reactions  . Aspirin Other (See Comments)    Ongoing anemia  . Iodine Swelling  . Penicillins Swelling    Has patient had a PCN reaction causing immediate rash, facial/tongue/throat swelling, SOB or lightheadedness with hypotension: Yes Has patient had a PCN reaction causing severe rash involving mucus membranes or skin necrosis: Yes Has patient had a PCN reaction that required hospitalization: No Has patient had a PCN reaction occurring within the last 10 years: No If all of the above answers are "NO", then may proceed with Cephalosporin use.     Medications: Prior to Admission medications   Medication Sig Start Date End Date Taking? Authorizing Provider  ACCU-CHEK AVIVA  PLUS test strip use 1 TEST STRIP to TEST BLOOD SUGAR four times a day 12/17/17   [provider]  albuterol (PROVENTIL HFA;VENTOLIN HFA) 108 (90 BASE) MCG/ACT inhaler Inhale 2 puffs into the lungs every 4 (four) hours as needed for wheezing or shortness of breath.     [provider]  aspirin 81 MG chewable tablet Chew 1 tablet (81 mg total) by mouth 2 (two) times daily. 05/18/16   Mcarthur Rossetti, MD  Blood Glucose Monitoring Suppl (ACCU-CHEK AVIVA PLUS) w/Device KIT See admin instructions. 12/19/17   [provider]  budesonide-formoterol (SYMBICORT) 160-4.5 MCG/ACT inhaler Inhale 2 puffs into the lungs 2 (two) times daily.    [provider]  Continuous Blood Gluc Receiver (FREESTYLE LIBRE 14 DAY READER) DEVI Use 1 applicator 4 (four) times daily 12/07/17   [provider]  diltiazem (CARDIZEM CD) 180 MG 24 hr capsule Take 180 mg by mouth at bedtime.     [provider]  FERREX 150 150 MG capsule TAKE 1 CAPSULE BY MOUTH TWICE A DAY  01/11/18   Lloyd Huger, MD  folic acid (FOLVITE) 1 MG tablet Take 1 tablet (1 mg total) by mouth daily. 01/26/18 04/26/18  Lin Landsman, MD  hydrochlorothiazide (HYDRODIURIL) 25 MG tablet take 1 tablet by mouth every morning 01/27/13   [provider]  insulin glargine (LANTUS) 100 UNIT/ML injection Inject 0.1 mLs (10 Units total) into the skin at bedtime. 11/19/17   Bettey Costa, MD  insulin lispro (HUMALOG) 100 UNIT/ML injection Please check your sugars before each meal and use lispro per the below sliding scale:  For sugars 151-200: take 2 units For 201-250: take 4units For 251-300: take 6 units For 301-350: take 8 units For 351-400: take 10 units For > 401: call MD 07/20/17   Gladstone Lighter, MD  Insulin Syringe-Needle U-100 (BD INSULIN SYRINGE ULTRAFINE) 31G X 5/16" 0.5 ML MISC use as directed once daily 06/25/17   [provider]  ipratropium-albuterol (DUONEB) 0.5-2.5 (3) MG/3ML SOLN Take 3 mLs by nebulization every 6 (six) hours as needed.    [provider]  nicotine (NICODERM CQ - DOSED IN MG/24 HOURS) 21 mg/24hr patch Place 1 patch onto the skin daily. 08/07/17   [provider]  nitroGLYCERIN (NITROSTAT) 0.4 MG SL tablet Place 1 tablet under the tongue every 5 (five) minutes as needed. 08/17/17   [provider]  omeprazole (PRILOSEC) 20 MG capsule Take 20 mg by mouth 2 (two) times daily before a meal.  03/29/16   [provider]  oxyCODONE-acetaminophen (PERCOCET) 7.5-325 MG tablet Take 1-2 tablets by mouth every 6 (six) hours as needed for severe pain. 05/18/16   Mcarthur Rossetti, MD  QUEtiapine (SEROQUEL) 50 MG tablet Take by mouth. 12/10/17   [provider]  triamcinolone (KENALOG) 0.1 % paste Use as directed 1 application in the mouth or throat daily. 02/23/15   [provider]  triamcinolone cream (KENALOG) 0.1 % Apply 1 application topically 2 (two) times daily.    [provider]     Physical Exam Vitals: There were no vitals taken for this visit.  General: NAD HEENT: normocephalic, anicteric Thyroid: no enlargement, no palpable nodules Pulmonary: No increased work of breathing, CTAB Cardiovascular: RRR, distal pulses 2+ Breast: Breast symmetrical, no tenderness, no palpable nodules or masses, no skin or nipple retraction present, no nipple discharge.  No axillary or supraclavicular lymphadenopathy. Abdomen: NABS, soft, non-tender, non-distended.  Umbilicus without lesions.  No  hepatomegaly, splenomegaly or masses palpable. No evidence of hernia  Genitourinary:  External: Normal external female genitalia.  Normal urethral meatus, normal Bartholin's and Skene's glands.    Vagina: Normal vaginal mucosa, no evidence of prolapse.    Cervix: Grossly normal in appearance, no bleeding  Uterus: Non-enlarged, mobile, normal contour.  No CMT  Adnexa: ovaries non-enlarged, no adnexal masses  Rectal: deferred  Lymphatic: no evidence of inguinal lymphadenopathy Extremities: no edema, erythema, or tenderness Neurologic: Grossly intact Psychiatric: mood appropriate, affect full  Female chaperone present for pelvic and breast  portions of the physical exam  Assessment: 62 y.o. No obstetric history on file. routine annual exam  Plan: Problem List Items Addressed This Visit    None    Visit Diagnoses    Health care maintenance    -  Primary   Encounter for screening breast examination       Relevant Orders   MM DIGITAL SCREENING BILATERAL   Encounter for Papanicolaou smear for cervical cancer screening       Relevant Orders   Cytology, thin prep pap (cervical)   Screening for osteoporosis       Special screening for malignant neoplasms, colon       Diabetic eye exam ([place])       Relevant Orders   Ambulatory referral to Ophthalmology      1) Mammogram - recommend yearly screening mammogram.  Mammogram Was ordered today  2) STI screening  was offered and  declined  3) ASCCP guidelines and rational discussed.  Patient opts for every 3 years screening interval  4) Osteoporosis  - per USPTF routine screening DEXA at age 78 - FRAX 47 year major fracture risk 4.8,  10 year hip fracture risk 0.7  Consider FDA-approved medical therapies in postmenopausal women and men aged 32 years and older, based on the following: a) A hip or vertebral (clinical or morphometric) fracture b) T-score ? -2.5 at the femoral neck or spine after appropriate evaluation to exclude secondary causes C) Low bone mass (T-score between -1.0 and -2.5 at the femoral neck or spine) and a 10-year probability of a hip fracture ? 3% or a 10-year probability of a major osteoporosis-related fracture ? 20% based on the [place]-adapted WHO algorithm   5) Routine healthcare maintenance including cholesterol, diabetes screening discussed managed by PCP  6) Colonoscopy is up to date.  Screening recommended starting at age 94 for average risk individuals, age 32 for individuals deemed at increased risk (including African Americans) and recommended to continue until age 43.  For patient age 42-85 individualized approach is recommended.  Gold standard screening is via colonoscopy, Cologuard screening is an acceptable alternative for patient unwilling or unable to undergo colonoscopy.  "Colorectal cancer screening for average?risk adults: 2018 guideline update from the American Cancer Society"CA: A Cancer Journal for Clinicians: Oct 15, 2016   7) Vision changes and need for diabetic eye exam. Will refer to opthalomology.   8) Return in about 1 year (around 01/29/2019) for annual.  Adrian Prows MD [place] 01/28/18 9:19 AM

## 2018-01-31 ENCOUNTER — Other Ambulatory Visit: Payer: Self-pay | Admitting: Gastroenterology

## 2018-01-31 NOTE — Progress Notes (Signed)
[facility]  [phone] [phone]) [phone]  ID: Deanna Gay OB: 09/01/1955  MR#: [id number]  GEX#:[id number]  Patient Care Team: Kirk Ruths, MD as PCP - General (Internal Medicine)  CHIEF COMPLAINT: Iron deficiency anemia.  INTERVAL HISTORY: Patient returns to clinic today for repeat laboratory work and further evaluation.  She has significantly increased fatigue today, but attributes this to spending several days at [facility] with her aunt.  She otherwise feels well.  She has no neurologic complaints.  She denies any recent fevers or illnesses. She has no chest pain or shortness of breath. She denies any nausea, vomiting, constipation, or diarrhea. She has no melena or hematochezia.  She has no urinary complaints.  Patient offers no further specific complaints today.  REVIEW OF SYSTEMS:   Review of Systems  Constitutional: Positive for malaise/fatigue. Negative for weight loss.  Respiratory: Negative.  Negative for cough, hemoptysis and shortness of breath.   Cardiovascular: Negative.  Negative for chest pain and leg swelling.  Gastrointestinal: Negative.  Negative for abdominal pain, blood in stool and melena.  Genitourinary: Negative.  Negative for frequency and hematuria.  Musculoskeletal: Negative.  Negative for myalgias.  Skin: Negative.  Negative for rash.  Neurological: Positive for weakness. Negative for dizziness, focal weakness and headaches.  Psychiatric/Behavioral: Negative.  The patient is not nervous/anxious.     As per HPI. Otherwise, a complete review of systems is negative.  PAST MEDICAL HISTORY: Past Medical History:  Diagnosis Date  . Anemia   . Arthritis   . Cancer ([place])    cervical  . COPD (chronic obstructive pulmonary disease) ([place])   . COPD exacerbation ([place]) 07/19/2017  . Diabetes mellitus without complication ([place])   . GERD (gastroesophageal reflux disease)   . Hypertension   .  Schizophrenia ([place])   . Sleep apnea     PAST SURGICAL HISTORY: Past Surgical History:  Procedure Laterality Date  . APPENDECTOMY    . COLONOSCOPY WITH PROPOFOL N/A 12/25/2014   Procedure: COLONOSCOPY WITH PROPOFOL;  Surgeon: Josefine Class, MD;  Location: [facility] ENDOSCOPY;  Service: Endoscopy;  Laterality: N/A;  . COLONOSCOPY WITH PROPOFOL N/A 01/21/2018   Procedure: COLONOSCOPY WITH PROPOFOL;  Surgeon: Lin Landsman, MD;  Location: [facility] ENDOSCOPY;  Service: Gastroenterology;  Laterality: N/A;  . ESOPHAGOGASTRODUODENOSCOPY (EGD) WITH PROPOFOL N/A 12/25/2014   Procedure: ESOPHAGOGASTRODUODENOSCOPY (EGD) WITH PROPOFOL;  Surgeon: Josefine Class, MD;  Location: [facility] ENDOSCOPY;  Service: Endoscopy;  Laterality: N/A;  . ESOPHAGOGASTRODUODENOSCOPY (EGD) WITH PROPOFOL N/A 01/21/2018   Procedure: ESOPHAGOGASTRODUODENOSCOPY (EGD) WITH PROPOFOL;  Surgeon: Lin Landsman, MD;  Location: [facility] ENDOSCOPY;  Service: Gastroenterology;  Laterality: N/A;  . JOINT REPLACEMENT     right hip  . TOTAL HIP ARTHROPLASTY Left 05/16/2016   Procedure: LEFT TOTAL HIP ARTHROPLASTY ANTERIOR APPROACH;  Surgeon: Mcarthur Rossetti, MD;  Location: [facility] ORS;  Service: Orthopedics;  Laterality: Left;    FAMILY HISTORY Family History  Problem Relation Age of Onset  . Hypertension Mother   . Diabetes Mellitus II Mother   . Breast cancer Neg Hx   . Kidney cancer Neg Hx   . Bladder Cancer Neg Hx        ADVANCED DIRECTIVES:    HEALTH MAINTENANCE: Social History   Tobacco Use  . Smoking status: Current Every Day Smoker    Packs/day: 1.00    Years: 47.00    Pack years: 47.00    Types: Cigarettes  . Smokeless tobacco: Never Used  Substance  Use Topics  . Alcohol use: No  . Drug use: No     Colonoscopy:  PAP:  Bone density:  Lipid panel:  Allergies  Allergen Reactions  . Aspirin Other (See Comments)    Ongoing anemia  . Iodine Swelling  . Penicillins Swelling    Has patient had a PCN  reaction causing immediate rash, facial/tongue/throat swelling, SOB or lightheadedness with hypotension: Yes Has patient had a PCN reaction causing severe rash involving mucus membranes or skin necrosis: Yes Has patient had a PCN reaction that required hospitalization: No Has patient had a PCN reaction occurring within the last 10 years: No If all of the above answers are "NO", then may proceed with Cephalosporin use.     Current Outpatient Medications  Medication Sig Dispense Refill  . ACCU-CHEK AVIVA PLUS test strip use 1 TEST STRIP to TEST BLOOD SUGAR four times a day  0  . albuterol (PROVENTIL HFA;VENTOLIN HFA) 108 (90 BASE) MCG/ACT inhaler Inhale 2 puffs into the lungs every 4 (four) hours as needed for wheezing or shortness of breath.     . Blood Glucose Monitoring Suppl (ACCU-CHEK AVIVA PLUS) w/Device KIT See admin instructions.  0  . budesonide-formoterol (SYMBICORT) 160-4.5 MCG/ACT inhaler Inhale 2 puffs into the lungs 2 (two) times daily.    . Continuous Blood Gluc Receiver (FREESTYLE LIBRE 14 DAY READER) DEVI Use 1 applicator 4 (four) times daily    . diltiazem (CARDIZEM CD) 180 MG 24 hr capsule Take 180 mg by mouth at bedtime.     Marland Kitchen FERREX 150 150 MG capsule TAKE 1 CAPSULE BY MOUTH TWICE A DAY 161 capsule 0  . folic acid (FOLVITE) 1 MG tablet TAKE 1 TABLET(1 MG) BY MOUTH DAILY 90 tablet 0  . hydrochlorothiazide (HYDRODIURIL) 25 MG tablet take 1 tablet by mouth every morning    . insulin glargine (LANTUS) 100 UNIT/ML injection Inject 0.1 mLs (10 Units total) into the skin at bedtime. 10 mL 11  . insulin lispro (HUMALOG) 100 UNIT/ML injection Please check your sugars before each meal and use lispro per the below sliding scale:  For sugars 151-200: take 2 units For 201-250: take 4units For 251-300: take 6 units For 301-350: take 8 units For 351-400: take 10 units For > 401: call MD 10 mL 11  . Insulin Syringe-Needle U-100 (BD INSULIN SYRINGE ULTRAFINE) 31G X 5/16" 0.5 ML MISC use  as directed once daily    . ipratropium-albuterol (DUONEB) 0.5-2.5 (3) MG/3ML SOLN Take 3 mLs by nebulization every 6 (six) hours as needed.    . nicotine (NICODERM CQ - DOSED IN MG/24 HOURS) 21 mg/24hr patch Place 1 patch onto the skin daily.  0  . nitroGLYCERIN (NITROSTAT) 0.4 MG SL tablet Place 1 tablet under the tongue every 5 (five) minutes as needed.  0  . oxyCODONE-acetaminophen (PERCOCET) 7.5-325 MG tablet Take 1-2 tablets by mouth every 6 (six) hours as needed for severe pain. 30 tablet 0  . QUEtiapine (SEROQUEL) 50 MG tablet Take by mouth.    . triamcinolone cream (KENALOG) 0.1 % Apply 1 application topically 2 (two) times daily.     No current facility-administered medications for this visit.     OBJECTIVE: Vitals:   02/05/18 0931  BP: (!) 142/76  Pulse: 69  Resp: 20  Temp: 97.9 F (36.6 C)     Body mass index is 35.04 kg/m.    ECOG FS:0 - Asymptomatic  General: Well-developed, well-nourished, no acute distress. Eyes: Pink conjunctiva, anicteric sclera.  HEENT: Normocephalic, moist mucous membranes. Lungs: Clear to auscultation bilaterally. Heart: Regular rate and rhythm. No rubs, murmurs, or gallops. Abdomen: Soft, nontender, nondistended. No organomegaly noted, normoactive bowel sounds. Musculoskeletal: No edema, cyanosis, or clubbing. Neuro: Alert, answering all questions appropriately. Cranial nerves grossly intact. Skin: No rashes or petechiae noted. Psych: Normal affect.  LAB RESULTS:  Lab Results  Component Value Date   NA 136 02/03/2018   K 4.0 02/03/2018   CL 99 02/03/2018   CO2 26 02/03/2018   GLUCOSE 119 (H) 02/03/2018   BUN 26 (H) 02/03/2018   CREATININE 1.73 (H) 02/03/2018   CALCIUM 9.0 02/03/2018   PROT 5.9 (L) 11/15/2017   ALBUMIN 1.9 (L) 11/18/2017   AST 12 (L) 11/15/2017   ALT 13 11/15/2017   ALKPHOS 71 11/15/2017   BILITOT 0.3 11/15/2017   GFRNONAA 30 (L) 02/03/2018   GFRAA 35 (L) 02/03/2018    Lab Results  Component Value Date    WBC 8.3 02/03/2018   NEUTROABS 6.0 02/03/2018   HGB 12.1 02/03/2018   HCT 37.1 02/03/2018   MCV 81.9 02/03/2018   PLT 247 02/03/2018   Lab Results  Component Value Date   FERRITIN 14 01/06/2018   Lab Results  Component Value Date   IRON 33 01/06/2018   TIBC 395 01/06/2018   IRONPCTSAT 8 (L) 01/06/2018      STUDIES: No results found.  ASSESSMENT: Iron deficiency anemia.  PLAN:    1. Iron deficiency anemia: Patient's hemoglobin has significantly improved and is now within normal limits.  Iron stores were not drawn at this clinic visit.  She does not require a blood transfusion or IV iron today.  Colonoscopy on January 21, 2018 revealed 6 polyps that were negative for malignancy.  Recommendation was to repeat in 3 years.  EGD did not reveal any evidence of bleeding.  Patient states she has a capsule endoscopy scheduled for early October.  Return to clinic in 4 weeks with repeat laboratory work and further evaluation.   2. Hyperglycemia: Patient has improved blood glucose control.  Continue insulin and diabetic medications as prescribed. 3.  Chronic renal insufficiency: Patient's creatinine has improved to 1.73.  If remains elevated at next clinic visit, will consider a nephrology referral.    Patient expressed understanding and was in agreement with this plan. She also understands that She can call clinic at any time with any questions, concerns, or complaints.    Lloyd Huger, MD   02/05/2018 10:44 AM

## 2018-02-01 LAB — CYTOLOGY - PAP
DIAGNOSIS: NEGATIVE
HPV: NOT DETECTED

## 2018-02-03 ENCOUNTER — Inpatient Hospital Stay: Payer: Medicare Other | Attending: Oncology

## 2018-02-03 DIAGNOSIS — Z86010 Personal history of colonic polyps: Secondary | ICD-10-CM | POA: Diagnosis not present

## 2018-02-03 DIAGNOSIS — E1165 Type 2 diabetes mellitus with hyperglycemia: Secondary | ICD-10-CM | POA: Diagnosis not present

## 2018-02-03 DIAGNOSIS — D509 Iron deficiency anemia, unspecified: Secondary | ICD-10-CM | POA: Insufficient documentation

## 2018-02-03 DIAGNOSIS — E1122 Type 2 diabetes mellitus with diabetic chronic kidney disease: Secondary | ICD-10-CM | POA: Diagnosis not present

## 2018-02-03 DIAGNOSIS — I129 Hypertensive chronic kidney disease with stage 1 through stage 4 chronic kidney disease, or unspecified chronic kidney disease: Secondary | ICD-10-CM | POA: Insufficient documentation

## 2018-02-03 DIAGNOSIS — N189 Chronic kidney disease, unspecified: Secondary | ICD-10-CM | POA: Insufficient documentation

## 2018-02-03 DIAGNOSIS — Z794 Long term (current) use of insulin: Secondary | ICD-10-CM | POA: Diagnosis not present

## 2018-02-03 DIAGNOSIS — D649 Anemia, unspecified: Secondary | ICD-10-CM

## 2018-02-03 LAB — CBC WITH DIFFERENTIAL/PLATELET
BASOS ABS: 0.1 10*3/uL (ref 0–0.1)
BASOS PCT: 1 %
EOS ABS: 0.1 10*3/uL (ref 0–0.7)
EOS PCT: 1 %
HCT: 37.1 % (ref 35.0–47.0)
Hemoglobin: 12.1 g/dL (ref 12.0–16.0)
LYMPHS PCT: 20 %
Lymphs Abs: 1.7 10*3/uL (ref 1.0–3.6)
MCH: 26.8 pg (ref 26.0–34.0)
MCHC: 32.7 g/dL (ref 32.0–36.0)
MCV: 81.9 fL (ref 80.0–100.0)
MONO ABS: 0.5 10*3/uL (ref 0.2–0.9)
Monocytes Relative: 6 %
Neutro Abs: 6 10*3/uL (ref 1.4–6.5)
Neutrophils Relative %: 72 %
PLATELETS: 247 10*3/uL (ref 150–440)
RBC: 4.53 MIL/uL (ref 3.80–5.20)
RDW: 20.7 % — AB (ref 11.5–14.5)
WBC: 8.3 10*3/uL (ref 3.6–11.0)

## 2018-02-03 LAB — BASIC METABOLIC PANEL
Anion gap: 11 (ref 5–15)
BUN: 26 mg/dL — AB (ref 8–23)
CALCIUM: 9 mg/dL (ref 8.9–10.3)
CO2: 26 mmol/L (ref 22–32)
CREATININE: 1.73 mg/dL — AB (ref 0.44–1.00)
Chloride: 99 mmol/L (ref 98–111)
GFR calc Af Amer: 35 mL/min — ABNORMAL LOW (ref >60)
GFR, EST NON AFRICAN AMERICAN: 30 mL/min — AB (ref >60)
Glucose, Bld: 119 mg/dL — ABNORMAL HIGH (ref 70–99)
POTASSIUM: 4 mmol/L (ref 3.5–5.1)
SODIUM: 136 mmol/L (ref 135–145)

## 2018-02-05 ENCOUNTER — Inpatient Hospital Stay (HOSPITAL_BASED_OUTPATIENT_CLINIC_OR_DEPARTMENT_OTHER): Payer: Medicare Other | Admitting: Oncology

## 2018-02-05 ENCOUNTER — Inpatient Hospital Stay: Payer: Medicare Other

## 2018-02-05 ENCOUNTER — Encounter: Payer: Self-pay | Admitting: Oncology

## 2018-02-05 VITALS — BP 142/76 | HR 69 | Temp 97.9°F | Resp 20 | Wt 210.5 lb

## 2018-02-05 DIAGNOSIS — Z794 Long term (current) use of insulin: Secondary | ICD-10-CM

## 2018-02-05 DIAGNOSIS — D509 Iron deficiency anemia, unspecified: Secondary | ICD-10-CM

## 2018-02-05 DIAGNOSIS — E1165 Type 2 diabetes mellitus with hyperglycemia: Secondary | ICD-10-CM

## 2018-02-05 DIAGNOSIS — N189 Chronic kidney disease, unspecified: Secondary | ICD-10-CM

## 2018-02-05 DIAGNOSIS — E1122 Type 2 diabetes mellitus with diabetic chronic kidney disease: Secondary | ICD-10-CM | POA: Diagnosis not present

## 2018-02-05 DIAGNOSIS — I129 Hypertensive chronic kidney disease with stage 1 through stage 4 chronic kidney disease, or unspecified chronic kidney disease: Secondary | ICD-10-CM | POA: Diagnosis not present

## 2018-02-05 DIAGNOSIS — Z86010 Personal history of colonic polyps: Secondary | ICD-10-CM

## 2018-02-05 NOTE — Progress Notes (Signed)
Patient reports fatigue today.

## 2018-02-15 ENCOUNTER — Telehealth: Payer: Self-pay | Admitting: Gastroenterology

## 2018-02-15 NOTE — Telephone Encounter (Signed)
Pt is calling  To reschedule  Her capsule study

## 2018-02-15 NOTE — Telephone Encounter (Signed)
Pt left vm she has procedure 02/16/2018  And has not stop taking her irion or any of her instructions please call

## 2018-02-16 ENCOUNTER — Other Ambulatory Visit: Payer: Self-pay

## 2018-02-16 ENCOUNTER — Ambulatory Visit: Admission: RE | Admit: 2018-02-16 | Payer: Medicare Other | Source: Ambulatory Visit | Admitting: Gastroenterology

## 2018-02-16 ENCOUNTER — Encounter: Admission: RE | Payer: Self-pay | Source: Ambulatory Visit

## 2018-02-16 DIAGNOSIS — D500 Iron deficiency anemia secondary to blood loss (chronic): Secondary | ICD-10-CM

## 2018-02-16 SURGERY — IMAGING PROCEDURE, GI TRACT, INTRALUMINAL, VIA CAPSULE
Anesthesia: General

## 2018-02-20 ENCOUNTER — Other Ambulatory Visit: Payer: Self-pay | Admitting: Oncology

## 2018-02-28 NOTE — Progress Notes (Signed)
[place]  [phone] Fax:(336) [phone]  ID: Deanna Gay OB: 12-20-1955  MR#: [id number]  IRJ#:[id number]  Patient Care Team: Kirk Ruths, MD as PCP - General (Internal Medicine)  CHIEF COMPLAINT: Iron deficiency anemia.  INTERVAL HISTORY: Patient returns to clinic today for repeat laboratory work and further evaluation.  She currently feels well and is asymptomatic.  She continues to have chronic fatigue. She has no neurologic complaints.  She denies any recent fevers or illnesses. She has no chest pain or shortness of breath. She denies any nausea, vomiting, constipation, or diarrhea. She has no melena or hematochezia.  She has no urinary complaints.  Patient offers no specific complaints today.  REVIEW OF SYSTEMS:   Review of Systems  Constitutional: Positive for malaise/fatigue. Negative for weight loss.  Respiratory: Negative.  Negative for cough, hemoptysis and shortness of breath.   Cardiovascular: Negative.  Negative for chest pain and leg swelling.  Gastrointestinal: Negative.  Negative for abdominal pain, blood in stool and melena.  Genitourinary: Negative.  Negative for frequency and hematuria.  Musculoskeletal: Negative.  Negative for myalgias.  Skin: Negative.  Negative for rash.  Neurological: Negative.  Negative for dizziness, focal weakness, weakness and headaches.  Psychiatric/Behavioral: Negative.  The patient is not nervous/anxious.     As per HPI. Otherwise, a complete review of systems is negative.  PAST MEDICAL HISTORY: Past Medical History:  Diagnosis Date  . Anemia   . Arthritis   . Cancer ([place])    cervical  . COPD (chronic obstructive pulmonary disease) ([place])   . COPD exacerbation ([place]) 07/19/2017  . Diabetes mellitus without complication ([place])   . GERD (gastroesophageal reflux disease)   . Hypertension   . Schizophrenia ([place])   . Sleep apnea     PAST SURGICAL HISTORY: Past Surgical History:    Procedure Laterality Date  . APPENDECTOMY    . COLONOSCOPY WITH PROPOFOL N/A 12/25/2014   Procedure: COLONOSCOPY WITH PROPOFOL;  Surgeon: Josefine Class, MD;  Location: [facility] ENDOSCOPY;  Service: Endoscopy;  Laterality: N/A;  . COLONOSCOPY WITH PROPOFOL N/A 01/21/2018   Procedure: COLONOSCOPY WITH PROPOFOL;  Surgeon: Lin Landsman, MD;  Location: [facility] ENDOSCOPY;  Service: Gastroenterology;  Laterality: N/A;  . ESOPHAGOGASTRODUODENOSCOPY (EGD) WITH PROPOFOL N/A 12/25/2014   Procedure: ESOPHAGOGASTRODUODENOSCOPY (EGD) WITH PROPOFOL;  Surgeon: Josefine Class, MD;  Location: [facility] ENDOSCOPY;  Service: Endoscopy;  Laterality: N/A;  . ESOPHAGOGASTRODUODENOSCOPY (EGD) WITH PROPOFOL N/A 01/21/2018   Procedure: ESOPHAGOGASTRODUODENOSCOPY (EGD) WITH PROPOFOL;  Surgeon: Lin Landsman, MD;  Location: [facility] ENDOSCOPY;  Service: Gastroenterology;  Laterality: N/A;  . GIVENS CAPSULE STUDY N/A 03/02/2018   Procedure: GIVENS CAPSULE STUDY;  Surgeon: Lin Landsman, MD;  Location: [facility] ENDOSCOPY;  Service: Gastroenterology;  Laterality: N/A;  . JOINT REPLACEMENT     right hip  . TOTAL HIP ARTHROPLASTY Left 05/16/2016   Procedure: LEFT TOTAL HIP ARTHROPLASTY ANTERIOR APPROACH;  Surgeon: Mcarthur Rossetti, MD;  Location: [facility] ORS;  Service: Orthopedics;  Laterality: Left;    FAMILY HISTORY Family History  Problem Relation Age of Onset  . Hypertension Mother   . Diabetes Mellitus II Mother   . Breast cancer Neg Hx   . Kidney cancer Neg Hx   . Bladder Cancer Neg Hx        ADVANCED DIRECTIVES:    HEALTH MAINTENANCE: Social History   Tobacco Use  . Smoking status: Current Every Day Smoker    Packs/day: 1.00    Years: 47.00  Pack years: 47.00    Types: Cigarettes  . Smokeless tobacco: Never Used  Substance Use Topics  . Alcohol use: No  . Drug use: No     Colonoscopy:  PAP:  Bone density:  Lipid panel:  Allergies  Allergen Reactions  . Aspirin Other (See Comments)     Ongoing anemia  . Iodine Swelling  . Penicillins Swelling    Has patient had a PCN reaction causing immediate rash, facial/tongue/throat swelling, SOB or lightheadedness with hypotension: Yes Has patient had a PCN reaction causing severe rash involving mucus membranes or skin necrosis: Yes Has patient had a PCN reaction that required hospitalization: No Has patient had a PCN reaction occurring within the last 10 years: No If all of the above answers are "NO", then may proceed with Cephalosporin use.     Current Outpatient Medications  Medication Sig Dispense Refill  . ACCU-CHEK AVIVA PLUS test strip use 1 TEST STRIP to TEST BLOOD SUGAR four times a day  0  . albuterol (PROVENTIL HFA;VENTOLIN HFA) 108 (90 BASE) MCG/ACT inhaler Inhale 2 puffs into the lungs every 4 (four) hours as needed for wheezing or shortness of breath.     . Blood Glucose Monitoring Suppl (ACCU-CHEK AVIVA PLUS) w/Device KIT See admin instructions.  0  . budesonide-formoterol (SYMBICORT) 160-4.5 MCG/ACT inhaler Inhale 2 puffs into the lungs 2 (two) times daily.    . Continuous Blood Gluc Receiver (FREESTYLE LIBRE 14 DAY READER) DEVI Use 1 applicator 4 (four) times daily    . diltiazem (CARDIZEM CD) 180 MG 24 hr capsule Take 180 mg by mouth at bedtime.     Marland Kitchen FERREX 150 150 MG capsule TAKE 1 CAPSULE BY MOUTH TWICE A DAY 884 capsule 0  . folic acid (FOLVITE) 1 MG tablet TAKE 1 TABLET(1 MG) BY MOUTH DAILY 90 tablet 0  . hydrochlorothiazide (HYDRODIURIL) 25 MG tablet take 1 tablet by mouth every morning    . insulin glargine (LANTUS) 100 UNIT/ML injection Inject 0.1 mLs (10 Units total) into the skin at bedtime. 10 mL 11  . insulin lispro (HUMALOG) 100 UNIT/ML injection Please check your sugars before each meal and use lispro per the below sliding scale:  For sugars 151-200: take 2 units For 201-250: take 4units For 251-300: take 6 units For 301-350: take 8 units For 351-400: take 10 units For > 401: call MD 10 mL 11   . Insulin Syringe-Needle U-100 (BD INSULIN SYRINGE ULTRAFINE) 31G X 5/16" 0.5 ML MISC use as directed once daily    . ipratropium-albuterol (DUONEB) 0.5-2.5 (3) MG/3ML SOLN Take 3 mLs by nebulization every 6 (six) hours as needed.    . nitroGLYCERIN (NITROSTAT) 0.4 MG SL tablet Place 1 tablet under the tongue every 5 (five) minutes as needed.  0  . oxyCODONE-acetaminophen (PERCOCET) 7.5-325 MG tablet Take 1-2 tablets by mouth every 6 (six) hours as needed for severe pain. 30 tablet 0  . QUEtiapine (SEROQUEL) 50 MG tablet Take by mouth.    . triamcinolone cream (KENALOG) 0.1 % Apply 1 application topically 2 (two) times daily.    . nicotine (NICODERM CQ - DOSED IN MG/24 HOURS) 21 mg/24hr patch Place 1 patch onto the skin daily.  0   No current facility-administered medications for this visit.     OBJECTIVE: Vitals:   03/05/18 0955  BP: (!) 151/82  Pulse: 83  Resp: 18  Temp: (!) 97.2 F (36.2 C)     Body mass index is 35.48 kg/m.  ECOG FS:0 - Asymptomatic  General: Well-developed, well-nourished, no acute distress. Eyes: Pink conjunctiva, anicteric sclera. HEENT: Normocephalic, moist mucous membranes. Lungs: Clear to auscultation bilaterally. Heart: Regular rate and rhythm. No rubs, murmurs, or gallops. Abdomen: Soft, nontender, nondistended. No organomegaly noted, normoactive bowel sounds. Musculoskeletal: No edema, cyanosis, or clubbing. Neuro: Alert, answering all questions appropriately. Cranial nerves grossly intact. Skin: No rashes or petechiae noted. Psych: Normal affect.  LAB RESULTS:  Lab Results  Component Value Date   NA 135 03/03/2018   K 3.8 03/03/2018   CL 96 (L) 03/03/2018   CO2 29 03/03/2018   GLUCOSE 193 (H) 03/03/2018   BUN 13 03/03/2018   CREATININE 1.35 (H) 03/03/2018   CALCIUM 8.6 (L) 03/03/2018   PROT 5.9 (L) 11/15/2017   ALBUMIN 1.9 (L) 11/18/2017   AST 12 (L) 11/15/2017   ALT 13 11/15/2017   ALKPHOS 71 11/15/2017   BILITOT 0.3 11/15/2017    GFRNONAA 41 (L) 03/03/2018   GFRAA 48 (L) 03/03/2018    Lab Results  Component Value Date   WBC 5.4 03/03/2018   NEUTROABS 3.4 03/03/2018   HGB 12.8 03/03/2018   HCT 41.3 03/03/2018   MCV 86.0 03/03/2018   PLT 254 03/03/2018   Lab Results  Component Value Date   FERRITIN 83 03/03/2018   Lab Results  Component Value Date   IRON 72 03/03/2018   TIBC 360 03/03/2018   IRONPCTSAT 20 03/03/2018      STUDIES: No results found.  ASSESSMENT: Iron deficiency anemia.  PLAN:    1. Iron deficiency anemia: Patient's hemoglobin and iron stores are now within normal limits.  She does not require a blood transfusion or IV iron today.  Her last infusion of IV Feraheme occurred on January 15, 2018.  Colonoscopy on January 21, 2018 revealed 6 polyps that were negative for malignancy.  Recommendation was to repeat in 3 years.  EGD did not reveal any evidence of bleeding.  Capsule endoscopy has been completed, but patient does not have results yet.  No intervention is needed.  Return to clinic in 3 months with repeat laboratory work and further evaluation.   2. Hyperglycemia: Patient has improved blood glucose control.  Continue insulin and diabetic medications as prescribed. 3.  Chronic renal insufficiency: Patient's creatinine continues to improve and is now 1.35.  Monitor.   Patient expressed understanding and was in agreement with this plan. She also understands that She can call clinic at any time with any questions, concerns, or complaints.    Lloyd Huger, MD   03/06/2018 10:01 AM

## 2018-03-02 ENCOUNTER — Encounter
Admission: RE | Disposition: A | Discharge status: Home / Self Care | Payer: Self-pay | Source: Ambulatory Visit | Attending: Gastroenterology

## 2018-03-02 ENCOUNTER — Ambulatory Visit
Admission: RE | Admit: 2018-03-02 | Discharge: 2018-03-02 | Disposition: A | Discharge status: Home / Self Care | Payer: Medicare Other | Source: Ambulatory Visit | Attending: Gastroenterology | Admitting: Gastroenterology

## 2018-03-02 ENCOUNTER — Encounter: Payer: Self-pay | Admitting: Gastroenterology

## 2018-03-02 DIAGNOSIS — D509 Iron deficiency anemia, unspecified: Secondary | ICD-10-CM | POA: Diagnosis not present

## 2018-03-02 DIAGNOSIS — K5000 Crohn's disease of small intestine without complications: Secondary | ICD-10-CM | POA: Diagnosis not present

## 2018-03-02 HISTORY — PX: GIVENS CAPSULE STUDY: SHX5432

## 2018-03-02 SURGERY — IMAGING PROCEDURE, GI TRACT, INTRALUMINAL, VIA CAPSULE
Anesthesia: General

## 2018-03-03 ENCOUNTER — Inpatient Hospital Stay: Payer: Medicare Other | Attending: Oncology

## 2018-03-03 DIAGNOSIS — I129 Hypertensive chronic kidney disease with stage 1 through stage 4 chronic kidney disease, or unspecified chronic kidney disease: Secondary | ICD-10-CM | POA: Insufficient documentation

## 2018-03-03 DIAGNOSIS — F17210 Nicotine dependence, cigarettes, uncomplicated: Secondary | ICD-10-CM | POA: Diagnosis not present

## 2018-03-03 DIAGNOSIS — N189 Chronic kidney disease, unspecified: Secondary | ICD-10-CM | POA: Diagnosis not present

## 2018-03-03 DIAGNOSIS — Z794 Long term (current) use of insulin: Secondary | ICD-10-CM | POA: Diagnosis not present

## 2018-03-03 DIAGNOSIS — Z862 Personal history of diseases of the blood and blood-forming organs and certain disorders involving the immune mechanism: Secondary | ICD-10-CM | POA: Diagnosis not present

## 2018-03-03 DIAGNOSIS — E1122 Type 2 diabetes mellitus with diabetic chronic kidney disease: Secondary | ICD-10-CM | POA: Insufficient documentation

## 2018-03-03 DIAGNOSIS — D509 Iron deficiency anemia, unspecified: Secondary | ICD-10-CM

## 2018-03-03 LAB — CBC WITH DIFFERENTIAL/PLATELET
ABS IMMATURE GRANULOCYTES: 0.02 10*3/uL (ref 0.00–0.07)
Basophils Absolute: 0.1 10*3/uL (ref 0.0–0.1)
Basophils Relative: 1 %
Eosinophils Absolute: 0 10*3/uL (ref 0.0–0.5)
Eosinophils Relative: 1 %
HCT: 41.3 % (ref 36.0–46.0)
HEMOGLOBIN: 12.8 g/dL (ref 12.0–15.0)
Immature Granulocytes: 0 %
LYMPHS PCT: 29 %
Lymphs Abs: 1.5 10*3/uL (ref 0.7–4.0)
MCH: 26.7 pg (ref 26.0–34.0)
MCHC: 31 g/dL (ref 30.0–36.0)
MCV: 86 fL (ref 80.0–100.0)
MONO ABS: 0.4 10*3/uL (ref 0.1–1.0)
MONOS PCT: 7 %
NEUTROS ABS: 3.4 10*3/uL (ref 1.7–7.7)
Neutrophils Relative %: 62 %
PLATELETS: 254 10*3/uL (ref 150–400)
RBC: 4.8 MIL/uL (ref 3.87–5.11)
RDW: 18.4 % — ABNORMAL HIGH (ref 11.5–15.5)
WBC: 5.4 10*3/uL (ref 4.0–10.5)
nRBC: 0 % (ref 0.0–0.2)

## 2018-03-03 LAB — BASIC METABOLIC PANEL
Anion gap: 10 (ref 5–15)
BUN: 13 mg/dL (ref 8–23)
CALCIUM: 8.6 mg/dL — AB (ref 8.9–10.3)
CHLORIDE: 96 mmol/L — AB (ref 98–111)
CO2: 29 mmol/L (ref 22–32)
CREATININE: 1.35 mg/dL — AB (ref 0.44–1.00)
GFR calc non Af Amer: 41 mL/min — ABNORMAL LOW (ref >60)
GFR, EST AFRICAN AMERICAN: 48 mL/min — AB (ref >60)
Glucose, Bld: 193 mg/dL — ABNORMAL HIGH (ref 70–99)
Potassium: 3.8 mmol/L (ref 3.5–5.1)
Sodium: 135 mmol/L (ref 135–145)

## 2018-03-03 LAB — IRON AND TIBC
Iron: 72 ug/dL (ref 28–170)
Saturation Ratios: 20 % (ref 10.4–31.8)
TIBC: 360 ug/dL (ref 250–450)
UIBC: 288 ug/dL

## 2018-03-03 LAB — FERRITIN: Ferritin: 83 ng/mL (ref 11–307)

## 2018-03-05 ENCOUNTER — Inpatient Hospital Stay: Payer: Medicare Other

## 2018-03-05 ENCOUNTER — Encounter: Payer: Self-pay | Admitting: Oncology

## 2018-03-05 ENCOUNTER — Inpatient Hospital Stay (HOSPITAL_BASED_OUTPATIENT_CLINIC_OR_DEPARTMENT_OTHER): Payer: Medicare Other | Admitting: Oncology

## 2018-03-05 VITALS — BP 151/82 | HR 83 | Temp 97.2°F | Resp 18 | Wt 213.2 lb

## 2018-03-05 DIAGNOSIS — Z794 Long term (current) use of insulin: Secondary | ICD-10-CM

## 2018-03-05 DIAGNOSIS — N189 Chronic kidney disease, unspecified: Secondary | ICD-10-CM

## 2018-03-05 DIAGNOSIS — I129 Hypertensive chronic kidney disease with stage 1 through stage 4 chronic kidney disease, or unspecified chronic kidney disease: Secondary | ICD-10-CM

## 2018-03-05 DIAGNOSIS — Z862 Personal history of diseases of the blood and blood-forming organs and certain disorders involving the immune mechanism: Secondary | ICD-10-CM | POA: Diagnosis not present

## 2018-03-05 DIAGNOSIS — F17210 Nicotine dependence, cigarettes, uncomplicated: Secondary | ICD-10-CM | POA: Diagnosis not present

## 2018-03-05 DIAGNOSIS — D509 Iron deficiency anemia, unspecified: Secondary | ICD-10-CM

## 2018-03-05 DIAGNOSIS — E1122 Type 2 diabetes mellitus with diabetic chronic kidney disease: Secondary | ICD-10-CM | POA: Diagnosis not present

## 2018-03-05 NOTE — Progress Notes (Signed)
Pt in for follow up, reports "increased fatigue, no energy".

## 2018-03-10 ENCOUNTER — Encounter: Payer: Self-pay | Admitting: Gastroenterology

## 2018-04-27 ENCOUNTER — Other Ambulatory Visit: Payer: Self-pay

## 2018-04-27 ENCOUNTER — Ambulatory Visit (INDEPENDENT_AMBULATORY_CARE_PROVIDER_SITE_OTHER): Payer: Medicare Other | Admitting: Gastroenterology

## 2018-04-27 ENCOUNTER — Encounter: Payer: Self-pay | Admitting: Gastroenterology

## 2018-04-27 VITALS — BP 135/79 | HR 99 | Resp 17 | Ht 65.0 in | Wt 216.0 lb

## 2018-04-27 DIAGNOSIS — E538 Deficiency of other specified B group vitamins: Secondary | ICD-10-CM

## 2018-04-27 DIAGNOSIS — R1013 Epigastric pain: Secondary | ICD-10-CM

## 2018-04-27 NOTE — Progress Notes (Signed)
Cephas Darby, MD [address]  [place]  [place], [place] [address]  Main: [phone]  Fax: [phone]    Gastroenterology Consultation  Referring Provider:     Kirk Ruths, MD Primary Care Physician:  Kirk Ruths, MD Primary Gastroenterologist:  Dr. Cephas Darby Reason for Consultation:  iron deficiency anemia, epigastric pain and burping        HPI:   Deanna Gay is a 62 y.o. African-American female referred by Dr. Ouida Sills, Ocie Cornfield, MD  for consultation & management of chronic iron deficiency anemia. She has history of diabetes, chronic tobacco use, COPD who was  admitted to [facility] on 10/29/2017 to 11/19/2017 secondary to UTI sepsis from Escherichia coli bacteremia that resulted in acute renal failure. She required temporary hemodialysis during that admission. She also developed acute on chronic normocytic anemia. Her last normal hemoglobin was 12.9 in 05/2017, lowest hemoglobin 7.4 during that admission. Patient has been followed by Dr. Cranford Mon at [facility] for iron deficiency anemia patient received blood transfusion on 12/14/17 and her hemoglobin responded appropriately. Patient has history of chronic iron deficiency anemia, her ferritin was 2 in 02/2015 and underwent EGD and colonoscopy by Dr Rayann Heman and no source of bleeding was identified. Patient reports taking Aleve one to 2 tablets a few times a week for chronic muscular skeletal pain. Most recent ferritin is 31 and folate is low at 5, B12 298. Patient has been receiving IV iron as needed at [facility]. Patient reports intermittent black stools. She denies rectal bleeding She denies chest pain, abdominal pain, shortness of breath, nausea or vomiting  Follow up visit 01/26/2018: She reports that she has stopped taking Aleve. She is not experiencing melena. She underwent EGD with gastric biopsies, unremarkable. Colonoscopy revealed multiple  subcentimeter tubular adenomas without evidence of high-grade dysplasia. I started her on folate due to folate deficiency. She continues to be iron deficient. She continues to smoke cigarettes.   Follow-up visit 2019 She stopped Prilosec and felt that her upper abdominal pain associated with bloating and burping has gotten worse.  Her anemia is currently resolved.  She is taking oral iron twice daily.  She has been taking folate daily.  She thinks folic acid is causing her GI symptoms.  She denies any other symptoms.  She continues to gain weight.  She tells me that she plays videogames on her phone until late night and sleeps during the day.  NSAIDs: Aleve 1-2 tablets daily  Antiplts/Anticoagulants/Anti thrombotics: none  GI Procedures:  EGD 01/21/2018 - Normal duodenal bulb and second portion of the duodenum. - A single papule (nodule) found in the stomach. Biopsied. - Non-bleeding erosive gastropathy. - Normal gastric fundus, incisura, antrum and prepyloric region of the stomach. Biopsied. - Normal gastroesophageal junction and esophagus. - Small hiatal hernia.  Colonoscopy 01/21/2018 - Five 5 to 7 mm polyps in the transverse colon, in the ascending colon and in the cecum, removed with a hot snare. Resected and retrieved. - One diminutive polyp in the ascending colon, removed with a cold biopsy forceps. Resected and retrieved. - Diverticulosis in the sigmoid colon. - The distal rectum and anal verge are normal on retroflexion view.  DIAGNOSIS:  A. STOMACH, RANDOM; COLD BIOPSY:  - ANTRAL MUCOSA WITH MILD CHRONIC AND FOCAL REACTIVE GASTRITIS.  - OXYNTIC MUCOSA WITH MILD CHRONIC GASTRITIS AND CHANGES CONSISTENT WITH  PROTON PUMP INHIBITOR USE.  - NEGATIVE FOR H. PYLORI, DYSPLASIA, AND MALIGNANCY.  B. STOMACH NODULE, GREATER CURVATURE; COLD BIOPSY:  - OXYNTIC MUCOSA WITH CHANGES SUGGESTIVE OF EARLY FUNDIC GLAND POLYP.  - NEGATIVE FOR H. PYLORI, DYSPLASIA, AND MALIGNANCY.   C.  COLON POLYP, CECUM; HOT SNARE:  - TUBULAR ADENOMA.  - NEGATIVE FOR HIGH-GRADE DYSPLASIA AND MALIGNANCY.   D. COLON POLYP X2, ASCENDING; COLD BIOPSY AND HOT SNARE:  - TUBULAR ADENOMA (MULTIPLE).  - NEGATIVE FOR HIGH-GRADE DYSPLASIA AND MALIGNANCY.   E. COLON POLYP X3, TRANSVERSE; HOT SNARE:  - TUBULAR ADENOMA (MULTIPLE).  - NEGATIVE FOR HIGH-GRADE DYSPLASIA AND MALIGNANCY.   EGD in 2016 - White nummular lesions in esophageal mucosa. Biopsied. - Normal stomach. - Normal examined duodenum.  Colonoscopy 12/25/2014 - Diverticulosis in the sigmoid colon. - The examination was otherwise normal on direct and retroflexion views. - No specimens collected.  Past Medical History:  Diagnosis Date  . Anemia   . Arthritis   . Cancer ([place])    cervical  . COPD (chronic obstructive pulmonary disease) ([place])   . COPD exacerbation ([place]) 07/19/2017  . Diabetes mellitus without complication ([place])   . GERD (gastroesophageal reflux disease)   . Hypertension   . Schizophrenia ([place])   . Sleep apnea     Past Surgical History:  Procedure Laterality Date  . APPENDECTOMY    . COLONOSCOPY WITH PROPOFOL N/A 12/25/2014   Procedure: COLONOSCOPY WITH PROPOFOL;  Surgeon: Josefine Class, MD;  Location: [facility] ENDOSCOPY;  Service: Endoscopy;  Laterality: N/A;  . COLONOSCOPY WITH PROPOFOL N/A 01/21/2018   Procedure: COLONOSCOPY WITH PROPOFOL;  Surgeon: Lin Landsman, MD;  Location: [facility] ENDOSCOPY;  Service: Gastroenterology;  Laterality: N/A;  . ESOPHAGOGASTRODUODENOSCOPY (EGD) WITH PROPOFOL N/A 12/25/2014   Procedure: ESOPHAGOGASTRODUODENOSCOPY (EGD) WITH PROPOFOL;  Surgeon: Josefine Class, MD;  Location: [facility] ENDOSCOPY;  Service: Endoscopy;  Laterality: N/A;  . ESOPHAGOGASTRODUODENOSCOPY (EGD) WITH PROPOFOL N/A 01/21/2018   Procedure: ESOPHAGOGASTRODUODENOSCOPY (EGD) WITH PROPOFOL;  Surgeon: Lin Landsman, MD;  Location[facility] ENDOSCOPY;  Service: Gastroenterology;  Laterality: N/A;  . GIVENS  CAPSULE STUDY N/A 03/02/2018   Procedure: GIVENS CAPSULE STUDY;  Surgeon: Lin Landsman, MD;  Location: [facility] ENDOSCOPY;  Service: Gastroenterology;  Laterality: N/A;  . JOINT REPLACEMENT     right hip  . TOTAL HIP ARTHROPLASTY Left 05/16/2016   Procedure: LEFT TOTAL HIP ARTHROPLASTY ANTERIOR APPROACH;  Surgeon: Mcarthur Rossetti, MD;  Location: [facility] ORS;  Service: Orthopedics;  Laterality: Left;    Current Outpatient Medications:  .  ACCU-CHEK AVIVA PLUS test strip, use 1 TEST STRIP to TEST BLOOD SUGAR four times a day, Disp: , Rfl: 0 .  albuterol (PROVENTIL HFA;VENTOLIN HFA) 108 (90 BASE) MCG/ACT inhaler, Inhale 2 puffs into the lungs every 4 (four) hours as needed for wheezing or shortness of breath. , Disp: , Rfl:  .  Blood Glucose Monitoring Suppl (ACCU-CHEK AVIVA PLUS) w/Device KIT, See admin instructions., Disp: , Rfl: 0 .  budesonide-formoterol (SYMBICORT) 160-4.5 MCG/ACT inhaler, Inhale 2 puffs into the lungs 2 (two) times daily., Disp: , Rfl:  .  Continuous Blood Gluc Receiver (FREESTYLE LIBRE 14 DAY READER) DEVI, Use 1 applicator 4 (four) times daily, Disp: , Rfl:  .  diclofenac sodium (VOLTAREN) 1 % GEL, APP 2 GRAMS EXT AA QID, Disp: , Rfl: 11 .  diltiazem (CARDIZEM CD) 180 MG 24 hr capsule, Take 180 mg by mouth at bedtime. , Disp: , Rfl:  .  FERREX 150 150 MG capsule, TAKE 1 CAPSULE BY MOUTH TWICE A DAY, Disp: 200 capsule, Rfl: 0 .  folic acid (FOLVITE) 1 MG tablet, TAKE 1 TABLET(1 MG) BY MOUTH DAILY, Disp: 90 tablet, Rfl: 0 .  hydrochlorothiazide (HYDRODIURIL) 25 MG tablet, take 1 tablet by mouth every morning, Disp: , Rfl:  .  insulin glargine (LANTUS) 100 UNIT/ML injection, Inject 0.1 mLs (10 Units total) into the skin at bedtime., Disp: 10 mL, Rfl: 11 .  insulin lispro (HUMALOG) 100 UNIT/ML injection, Please check your sugars before each meal and use lispro per the below sliding scale:  For sugars 151-200: take 2 units For 201-250: take 4units For 251-300: take 6 units  For 301-350: take 8 units For 351-400: take 10 units For > 401: call MD, Disp: 10 mL, Rfl: 11 .  Insulin Syringe-Needle U-100 (BD INSULIN SYRINGE ULTRAFINE) 31G X 5/16" 0.5 ML MISC, use as directed once daily, Disp: , Rfl:  .  ipratropium-albuterol (DUONEB) 0.5-2.5 (3) MG/3ML SOLN, Take 3 mLs by nebulization every 6 (six) hours as needed., Disp: , Rfl:  .  nitroGLYCERIN (NITROSTAT) 0.4 MG SL tablet, Place 1 tablet under the tongue every 5 (five) minutes as needed., Disp: , Rfl: 0 .  oxyCODONE-acetaminophen (PERCOCET) 7.5-325 MG tablet, Take 1-2 tablets by mouth every 6 (six) hours as needed for severe pain., Disp: 30 tablet, Rfl: 0 .  promethazine (PHENERGAN) 25 MG tablet, , Disp: , Rfl: 0 .  triamcinolone cream (KENALOG) 0.1 %, Apply 1 application topically 2 (two) times daily., Disp: , Rfl:  .  nicotine (NICODERM CQ - DOSED IN MG/24 HOURS) 21 mg/24hr patch, Place 1 patch onto the skin daily., Disp: , Rfl: 0 .  nitrofurantoin, macrocrystal-monohydrate, (MACROBID) 100 MG capsule, , Disp: , Rfl: 0 .  QUEtiapine (SEROQUEL) 50 MG tablet, Take by mouth., Disp: , Rfl:    Family History  Problem Relation Age of Onset  . Hypertension Mother   . Diabetes Mellitus II Mother   . Breast cancer Neg Hx   . Kidney cancer Neg Hx   . Bladder Cancer Neg Hx      Social History   Tobacco Use  . Smoking status: Current Every Day Smoker    Packs/day: 1.00    Years: 47.00    Pack years: 47.00    Types: Cigarettes  . Smokeless tobacco: Never Used  Substance Use Topics  . Alcohol use: No  . Drug use: No    Allergies as of 04/27/2018 - Review Complete 04/27/2018  Allergen Reaction Noted  . Aspirin Other (See Comments) 11/26/2016  . Iodine Swelling 12/22/2014  . Penicillins Swelling 12/25/2014    Review of Systems:    All systems reviewed and negative except where noted in HPI.   Physical Exam:  BP 135/79 (BP Location: Left Arm, Patient Position: Sitting, Cuff Size: Normal)   Pulse 99   Resp 17    Ht 5' 5"  (1.651 m)   Wt 216 lb (98 kg)   LMP  (LMP Unknown) Comment: postmenopausal  BMI 35.94 kg/m  No LMP recorded (lmp unknown). Patient is postmenopausal.  General:   Alert,  Well-developed, well-nourished, pleasant and cooperative in NAD Head:  Normocephalic and atraumatic. Eyes:  Sclera clear, no icterus.   Conjunctiva pink. Ears:  Normal auditory acuity. Nose:  No deformity, discharge, or lesions. Mouth:  No deformity or lesions,oropharynx pink & moist. Neck:  Supple; no masses or thyromegaly. Lungs:  Respirations even and unlabored.  Clear throughout to auscultation.   No wheezes, crackles, or rhonchi. No acute distress. Heart:  Regular rate and rhythm; no murmurs, clicks, rubs,  or gallops. Abdomen:  Normal bowel sounds. Soft, non-tender and non-distended without masses, hepatosplenomegaly or hernias noted.  No guarding or rebound tenderness.   Rectal: Not performed Msk:  Symmetrical without gross deformities. Good, equal movement & strength bilaterally. Pulses:  Normal pulses noted. Extremities:  No clubbing or edema.  No cyanosis. Neurologic:  Alert and oriented x3;  grossly normal neurologically. Skin:  Intact without significant lesions or rashes. No jaundice. Lymph Nodes:  No significant cervical adenopathy. Psych:  Alert and cooperative. Normal mood and affect.  Imaging Studies: Reviewed  Assessment and Plan:   Deanna Gay is a 62 y.o. African-American female with history of diabetes, metabolic syndrome, chronic iron deficiency anemia of unclear etiology, Escherichia coli bacteremia resulting in acute renal failure, temporary hemodialysis seen for follow-up of chronic iron deficiency anemia. Patient underwent EGD and colonoscopy in 2016 and no bleeding source was identified. She does report history of chronic intermittent NSAID use and intermittent black stools. Currently, she stopped NSAID use. She underwent repeat upper endoscopy and colonoscopy which did not  reveal source of iron deficiency anemia. She does have folate deficiency  Iron deficiency anemia: Resolved EGD and colonoscopy with no source of IDA video capsule endoscopy was normal Normal ferritin levels and normal hemoglobin, patient does not want to stop oral iron unless she talks to Dr. Grayland Ormond continue oral folate 1 mg daily for folate deficiency Check serum folic acid and U43 levels today Continue to avoid NSAIDs  Dyspepsia Trial of Dexilant 60 mg daily, samples provided Trial of FD guard  History of tubular adenomas of colon: Recommend repeat colonoscopy in 01/2021   Follow up in 3 months   Cephas Darby, MD

## 2018-04-28 LAB — B12 AND FOLATE PANEL
Folate: >20 ng/mL (ref >3.0)
Vitamin B-12: 406 pg/mL (ref 232–1245)

## 2018-05-11 ENCOUNTER — Inpatient Hospital Stay: Payer: Medicare Other

## 2018-05-11 ENCOUNTER — Other Ambulatory Visit: Payer: Self-pay | Admitting: *Deleted

## 2018-05-11 DIAGNOSIS — R0602 Shortness of breath: Secondary | ICD-10-CM | POA: Diagnosis not present

## 2018-05-11 DIAGNOSIS — D509 Iron deficiency anemia, unspecified: Secondary | ICD-10-CM

## 2018-05-11 DIAGNOSIS — I129 Hypertensive chronic kidney disease with stage 1 through stage 4 chronic kidney disease, or unspecified chronic kidney disease: Secondary | ICD-10-CM

## 2018-05-11 DIAGNOSIS — Z794 Long term (current) use of insulin: Secondary | ICD-10-CM

## 2018-05-11 DIAGNOSIS — E1122 Type 2 diabetes mellitus with diabetic chronic kidney disease: Secondary | ICD-10-CM | POA: Insufficient documentation

## 2018-05-11 DIAGNOSIS — Z862 Personal history of diseases of the blood and blood-forming organs and certain disorders involving the immune mechanism: Secondary | ICD-10-CM

## 2018-05-11 DIAGNOSIS — N189 Chronic kidney disease, unspecified: Secondary | ICD-10-CM

## 2018-05-11 DIAGNOSIS — F17210 Nicotine dependence, cigarettes, uncomplicated: Secondary | ICD-10-CM

## 2018-05-11 DIAGNOSIS — J441 Chronic obstructive pulmonary disease with (acute) exacerbation: Secondary | ICD-10-CM | POA: Diagnosis not present

## 2018-05-11 LAB — CBC WITH DIFFERENTIAL/PLATELET
Abs Immature Granulocytes: 0.01 10*3/uL (ref 0.00–0.07)
BASOS ABS: 0 10*3/uL (ref 0.0–0.1)
Basophils Relative: 1 %
Eosinophils Absolute: 0.1 10*3/uL (ref 0.0–0.5)
Eosinophils Relative: 2 %
HEMATOCRIT: 42.4 % (ref 36.0–46.0)
Hemoglobin: 13.5 g/dL (ref 12.0–15.0)
Immature Granulocytes: 0 %
LYMPHS ABS: 1.3 10*3/uL (ref 0.7–4.0)
Lymphocytes Relative: 37 %
MCH: 27.6 pg (ref 26.0–34.0)
MCHC: 31.8 g/dL (ref 30.0–36.0)
MCV: 86.7 fL (ref 80.0–100.0)
Monocytes Absolute: 0.4 10*3/uL (ref 0.1–1.0)
Monocytes Relative: 11 %
NRBC: 0 % (ref 0.0–0.2)
Neutro Abs: 1.7 10*3/uL (ref 1.7–7.7)
Neutrophils Relative %: 49 %
Platelets: 221 10*3/uL (ref 150–400)
RBC: 4.89 MIL/uL (ref 3.87–5.11)
RDW: 15.2 % (ref 11.5–15.5)
WBC: 3.5 10*3/uL — ABNORMAL LOW (ref 4.0–10.5)

## 2018-05-11 LAB — IRON AND TIBC
Iron: 49 ug/dL (ref 28–170)
Saturation Ratios: 14 % (ref 10.4–31.8)
TIBC: 341 ug/dL (ref 250–450)
UIBC: 293 ug/dL

## 2018-05-11 LAB — FERRITIN: Ferritin: 174 ng/mL (ref 11–307)

## 2018-05-13 ENCOUNTER — Telehealth: Payer: Self-pay | Admitting: *Deleted

## 2018-05-13 NOTE — Telephone Encounter (Signed)
Discussed with Alease Medina, NP and as her labs are normal, she is to contact her PCP or go to Urgent care for evaluation. Patient advised of this and agrees , stating her PCP just checked her for the flu last week.

## 2018-05-13 NOTE — Telephone Encounter (Signed)
-----   Message from Cephus Richer sent at 05/13/2018  9:42 AM EST ----- Regarding: Results Hey pt call would like to know her lab results. Pt having a hard time breathing. [phone]

## 2018-05-13 NOTE — Telephone Encounter (Signed)
-----   Message from Cephus Richer sent at 05/13/2018  9:42 AM EST ----- Regarding: Results Hey pt call would like to know her lab results. Pt having a hard time breathing. [phone]

## 2018-05-13 NOTE — Telephone Encounter (Signed)
error 

## 2018-05-14 ENCOUNTER — Emergency Department: Payer: Medicare Other

## 2018-05-14 ENCOUNTER — Inpatient Hospital Stay
Admission: EM | Admit: 2018-05-14 | Discharge: 2018-05-16 | DRG: 190 | Disposition: A | Discharge status: Home / Self Care | Payer: Medicare Other | Attending: Specialist | Admitting: Specialist

## 2018-05-14 ENCOUNTER — Other Ambulatory Visit: Payer: Self-pay

## 2018-05-14 ENCOUNTER — Encounter: Payer: Self-pay | Admitting: Emergency Medicine

## 2018-05-14 DIAGNOSIS — E1122 Type 2 diabetes mellitus with diabetic chronic kidney disease: Secondary | ICD-10-CM | POA: Diagnosis present

## 2018-05-14 DIAGNOSIS — J440 Chronic obstructive pulmonary disease with acute lower respiratory infection: Secondary | ICD-10-CM | POA: Diagnosis present

## 2018-05-14 DIAGNOSIS — J9601 Acute respiratory failure with hypoxia: Secondary | ICD-10-CM | POA: Diagnosis present

## 2018-05-14 DIAGNOSIS — E1165 Type 2 diabetes mellitus with hyperglycemia: Secondary | ICD-10-CM | POA: Diagnosis present

## 2018-05-14 DIAGNOSIS — F17210 Nicotine dependence, cigarettes, uncomplicated: Secondary | ICD-10-CM | POA: Diagnosis present

## 2018-05-14 DIAGNOSIS — J40 Bronchitis, not specified as acute or chronic: Secondary | ICD-10-CM | POA: Diagnosis present

## 2018-05-14 DIAGNOSIS — Z96642 Presence of left artificial hip joint: Secondary | ICD-10-CM | POA: Diagnosis present

## 2018-05-14 DIAGNOSIS — I129 Hypertensive chronic kidney disease with stage 1 through stage 4 chronic kidney disease, or unspecified chronic kidney disease: Secondary | ICD-10-CM | POA: Diagnosis present

## 2018-05-14 DIAGNOSIS — J441 Chronic obstructive pulmonary disease with (acute) exacerbation: Secondary | ICD-10-CM | POA: Diagnosis present

## 2018-05-14 DIAGNOSIS — Z8541 Personal history of malignant neoplasm of cervix uteri: Secondary | ICD-10-CM | POA: Diagnosis not present

## 2018-05-14 DIAGNOSIS — Z794 Long term (current) use of insulin: Secondary | ICD-10-CM

## 2018-05-14 DIAGNOSIS — Z833 Family history of diabetes mellitus: Secondary | ICD-10-CM | POA: Diagnosis not present

## 2018-05-14 DIAGNOSIS — R0602 Shortness of breath: Secondary | ICD-10-CM | POA: Diagnosis present

## 2018-05-14 DIAGNOSIS — N183 Chronic kidney disease, stage 3 (moderate): Secondary | ICD-10-CM | POA: Diagnosis present

## 2018-05-14 DIAGNOSIS — Z791 Long term (current) use of non-steroidal anti-inflammatories (NSAID): Secondary | ICD-10-CM | POA: Diagnosis not present

## 2018-05-14 DIAGNOSIS — Z7951 Long term (current) use of inhaled steroids: Secondary | ICD-10-CM | POA: Diagnosis not present

## 2018-05-14 DIAGNOSIS — F209 Schizophrenia, unspecified: Secondary | ICD-10-CM | POA: Diagnosis present

## 2018-05-14 DIAGNOSIS — I10 Essential (primary) hypertension: Secondary | ICD-10-CM | POA: Diagnosis present

## 2018-05-14 DIAGNOSIS — Z8249 Family history of ischemic heart disease and other diseases of the circulatory system: Secondary | ICD-10-CM

## 2018-05-14 DIAGNOSIS — E785 Hyperlipidemia, unspecified: Secondary | ICD-10-CM | POA: Diagnosis present

## 2018-05-14 DIAGNOSIS — Z79899 Other long term (current) drug therapy: Secondary | ICD-10-CM

## 2018-05-14 DIAGNOSIS — Z7952 Long term (current) use of systemic steroids: Secondary | ICD-10-CM | POA: Diagnosis not present

## 2018-05-14 DIAGNOSIS — K219 Gastro-esophageal reflux disease without esophagitis: Secondary | ICD-10-CM | POA: Diagnosis present

## 2018-05-14 DIAGNOSIS — G4730 Sleep apnea, unspecified: Secondary | ICD-10-CM | POA: Diagnosis present

## 2018-05-14 DIAGNOSIS — T380X5A Adverse effect of glucocorticoids and synthetic analogues, initial encounter: Secondary | ICD-10-CM | POA: Diagnosis present

## 2018-05-14 LAB — CBC
HCT: 38.2 % (ref 36.0–46.0)
Hemoglobin: 11.9 g/dL — ABNORMAL LOW (ref 12.0–15.0)
MCH: 27.5 pg (ref 26.0–34.0)
MCHC: 31.2 g/dL (ref 30.0–36.0)
MCV: 88.2 fL (ref 80.0–100.0)
Platelets: 220 10*3/uL (ref 150–400)
RBC: 4.33 MIL/uL (ref 3.87–5.11)
RDW: 14.6 % (ref 11.5–15.5)
WBC: 6.4 10*3/uL (ref 4.0–10.5)
nRBC: 0 % (ref 0.0–0.2)

## 2018-05-14 LAB — TROPONIN I: Troponin I: <0.03 ng/mL (ref <0.03)

## 2018-05-14 LAB — BRAIN NATRIURETIC PEPTIDE: B Natriuretic Peptide: 45 pg/mL (ref 0.0–100.0)

## 2018-05-14 LAB — INFLUENZA PANEL BY PCR (TYPE A & B)
Influenza A By PCR: NEGATIVE
Influenza B By PCR: NEGATIVE

## 2018-05-14 LAB — BASIC METABOLIC PANEL
Anion gap: 8 (ref 5–15)
BUN: 12 mg/dL (ref 8–23)
CO2: 28 mmol/L (ref 22–32)
Calcium: 7.7 mg/dL — ABNORMAL LOW (ref 8.9–10.3)
Chloride: 101 mmol/L (ref 98–111)
Creatinine, Ser: 1.04 mg/dL — ABNORMAL HIGH (ref 0.44–1.00)
GFR calc non Af Amer: 58 mL/min — ABNORMAL LOW (ref >60)
Glucose, Bld: 165 mg/dL — ABNORMAL HIGH (ref 70–99)
Potassium: 3.7 mmol/L (ref 3.5–5.1)
Sodium: 137 mmol/L (ref 135–145)

## 2018-05-14 LAB — GLUCOSE, CAPILLARY: Glucose-Capillary: 344 mg/dL — ABNORMAL HIGH (ref 70–99)

## 2018-05-14 MED ORDER — METHYLPREDNISOLONE SODIUM SUCC 125 MG IJ SOLR
60.0000 mg | Freq: Four times a day (QID) | INTRAMUSCULAR | Status: DC
  Administered 2018-05-15 – 2018-05-16 (×6): 60 mg via INTRAVENOUS
  Filled 2018-05-14 (×6): qty 2

## 2018-05-14 MED ORDER — ENOXAPARIN SODIUM 40 MG/0.4ML ~~LOC~~ SOLN
40.0000 mg | SUBCUTANEOUS | Status: DC
  Filled 2018-05-14: qty 0.4

## 2018-05-14 MED ORDER — ACETAMINOPHEN 650 MG RE SUPP: 650.0000 mg | Freq: Four times a day (QID) | RECTAL | Status: DC | PRN

## 2018-05-14 MED ORDER — IPRATROPIUM-ALBUTEROL 0.5-2.5 (3) MG/3ML IN SOLN: 3.0000 mL | RESPIRATORY_TRACT | Status: DC | PRN

## 2018-05-14 MED ORDER — DILTIAZEM HCL ER COATED BEADS 180 MG PO CP24
180.0000 mg | ORAL_CAPSULE | Freq: Every day | ORAL | Status: DC
  Administered 2018-05-14 – 2018-05-15 (×2): 180 mg via ORAL
  Filled 2018-05-14 (×3): qty 1

## 2018-05-14 MED ORDER — BENZONATATE 100 MG PO CAPS
200.0000 mg | ORAL_CAPSULE | Freq: Three times a day (TID) | ORAL | Status: DC | PRN
  Administered 2018-05-14: 200 mg via ORAL
  Filled 2018-05-14: qty 2

## 2018-05-14 MED ORDER — DOXYCYCLINE HYCLATE 100 MG PO TABS
100.0000 mg | ORAL_TABLET | Freq: Once | ORAL | Status: AC
  Administered 2018-05-14: 100 mg via ORAL
  Filled 2018-05-14: qty 1

## 2018-05-14 MED ORDER — ALBUTEROL SULFATE (2.5 MG/3ML) 0.083% IN NEBU
2.5000 mg | INHALATION_SOLUTION | Freq: Once | RESPIRATORY_TRACT | Status: AC
  Administered 2018-05-14: 2.5 mg via RESPIRATORY_TRACT
  Filled 2018-05-14: qty 3

## 2018-05-14 MED ORDER — AZITHROMYCIN 500 MG PO TABS
500.0000 mg | ORAL_TABLET | Freq: Every day | ORAL | Status: DC
  Administered 2018-05-15 – 2018-05-16 (×2): 500 mg via ORAL
  Filled 2018-05-14 (×2): qty 1

## 2018-05-14 MED ORDER — METHYLPREDNISOLONE SODIUM SUCC 125 MG IJ SOLR
125.0000 mg | Freq: Once | INTRAMUSCULAR | Status: AC
  Administered 2018-05-14: 125 mg via INTRAVENOUS
  Filled 2018-05-14: qty 2

## 2018-05-14 MED ORDER — MOMETASONE FURO-FORMOTEROL FUM 200-5 MCG/ACT IN AERO
2.0000 | INHALATION_SPRAY | Freq: Two times a day (BID) | RESPIRATORY_TRACT | Status: DC
  Administered 2018-05-14 – 2018-05-16 (×4): 2 via RESPIRATORY_TRACT
  Filled 2018-05-14: qty 8.8

## 2018-05-14 MED ORDER — ACETAMINOPHEN 325 MG PO TABS
650.0000 mg | ORAL_TABLET | Freq: Four times a day (QID) | ORAL | Status: DC | PRN
  Administered 2018-05-14 – 2018-05-15 (×3): 650 mg via ORAL
  Filled 2018-05-14 (×3): qty 2

## 2018-05-14 MED ORDER — INSULIN ASPART 100 UNIT/ML ~~LOC~~ SOLN: 0.0000 [IU] | Freq: Three times a day (TID) | SUBCUTANEOUS | Status: DC

## 2018-05-14 MED ORDER — IPRATROPIUM-ALBUTEROL 0.5-2.5 (3) MG/3ML IN SOLN
3.0000 mL | Freq: Once | RESPIRATORY_TRACT | Status: AC
  Administered 2018-05-14: 3 mL via RESPIRATORY_TRACT
  Filled 2018-05-14: qty 3

## 2018-05-14 MED ORDER — ALBUTEROL SULFATE (2.5 MG/3ML) 0.083% IN NEBU
5.0000 mg | INHALATION_SOLUTION | Freq: Once | RESPIRATORY_TRACT | Status: AC
  Administered 2018-05-14: 5 mg via RESPIRATORY_TRACT
  Filled 2018-05-14: qty 6

## 2018-05-14 MED ORDER — INSULIN ASPART 100 UNIT/ML ~~LOC~~ SOLN
0.0000 [IU] | Freq: Every day | SUBCUTANEOUS | Status: DC
  Administered 2018-05-14: 4 [IU] via SUBCUTANEOUS
  Filled 2018-05-14: qty 1

## 2018-05-14 MED ORDER — GUAIFENESIN-DM 100-10 MG/5ML PO SYRP
5.0000 mL | ORAL_SOLUTION | ORAL | Status: DC | PRN
  Administered 2018-05-16: 5 mL via ORAL
  Filled 2018-05-14: qty 5

## 2018-05-14 MED ORDER — ONDANSETRON HCL 4 MG PO TABS: 4.0000 mg | ORAL_TABLET | Freq: Four times a day (QID) | ORAL | Status: DC | PRN

## 2018-05-14 MED ORDER — ONDANSETRON HCL 4 MG/2ML IJ SOLN: 4.0000 mg | Freq: Four times a day (QID) | INTRAMUSCULAR | Status: DC | PRN

## 2018-05-14 MED ORDER — FUROSEMIDE 10 MG/ML IJ SOLN
60.0000 mg | Freq: Once | INTRAMUSCULAR | Status: AC
  Administered 2018-05-14: 60 mg via INTRAVENOUS
  Filled 2018-05-14: qty 8

## 2018-05-14 NOTE — ED Notes (Signed)
Pt asking to be placed on oxygen despite oxygen saturation reading 93% on RA. This RN placed oxygen BNC at 2L on pt to make her feel better. Oxygen saturation staying at 94%

## 2018-05-14 NOTE — ED Notes (Signed)
Floor unable to take report at this time , will call back in 5 min .

## 2018-05-14 NOTE — ED Triage Notes (Signed)
Pt arrived via POV with reports of shortness of breath and left arm pain. Pt states the SOB started about 1 week ago, pt states the left arm pain has been going for about 2 years and her PCP is going to order her a nerve conduction study.   Pt states she needs antibiotics and steroids today.

## 2018-05-14 NOTE — ED Notes (Signed)
Lab notified about BNP order.

## 2018-05-14 NOTE — ED Provider Notes (Signed)
[facility] Emergency Department Provider Note    First MD Initiated Contact with Patient 05/14/18 1834     (approximate)  I have reviewed the triage vital signs and the nursing notes.   HISTORY  Chief Complaint Shortness of Breath    HPI Deanna Gay is a 62 y.o. female story of CoPD  diabetes as well as a history of heart failure presents the ER for 1 week of worsening shortness of breath and nonproductive cough.  No fevers at home.  Denies any chest pain.  States when she lays flat she does get worsening shortness of breath and any exertion.  She denies any nausea or vomiting.  She is been using her inhalers around-the-clock without any improvement.  Has not been on any recent antibiotics or steroids.  States that she started feeling sick earlier this week but did not want to come into the ER as she was afraid that she would have to be admitted and wanted to be at home for her granddaughter who is visiting from out of town for Christmas.  Echo 2019: INTERPRETATION NORMAL LEFT VENTRICULAR SYSTOLIC FUNCTION WITH AN ESTIMATED EF = > 55 % NORMAL RIGHT VENTRICULAR SYSTOLIC FUNCTION MILD TRICUSPID AND AORTIC VALVE INSUFFICIENCY TRACE MITRAL VALVE INSUFFICIENCY NO VALVULAR STENOSIS   Past Medical History:  Diagnosis Date  . Anemia   . Arthritis   . Cancer ([place])    cervical  . COPD (chronic obstructive pulmonary disease) ([place])   . COPD exacerbation ([place]) 07/19/2017  . Diabetes mellitus without complication ([place])   . GERD (gastroesophageal reflux disease)   . Hypertension   . Schizophrenia ([place])   . Sleep apnea    Family History  Problem Relation Age of Onset  . Hypertension Mother   . Diabetes Mellitus II Mother   . Breast cancer Neg Hx   . Kidney cancer Neg Hx   . Bladder Cancer Neg Hx    Past Surgical History:  Procedure Laterality Date  . APPENDECTOMY    . COLONOSCOPY WITH PROPOFOL N/A 12/25/2014   Procedure: COLONOSCOPY WITH PROPOFOL;   Surgeon: Josefine Class, MD;  Location[facility] ENDOSCOPY;  Service: Endoscopy;  Laterality: N/A;  . COLONOSCOPY WITH PROPOFOL N/A 01/21/2018   Procedure: COLONOSCOPY WITH PROPOFOL;  Surgeon: Lin Landsman, MD;  Location: [facility] ENDOSCOPY;  Service: Gastroenterology;  Laterality: N/A;  . ESOPHAGOGASTRODUODENOSCOPY (EGD) WITH PROPOFOL N/A 12/25/2014   Procedure: ESOPHAGOGASTRODUODENOSCOPY (EGD) WITH PROPOFOL;  Surgeon: Josefine Class, MD;  Location: [facility] ENDOSCOPY;  Service: Endoscopy;  Laterality: N/A;  . ESOPHAGOGASTRODUODENOSCOPY (EGD) WITH PROPOFOL N/A 01/21/2018   Procedure: ESOPHAGOGASTRODUODENOSCOPY (EGD) WITH PROPOFOL;  Surgeon: Lin Landsman, MD;  Location: [facility] ENDOSCOPY;  Service: Gastroenterology;  Laterality: N/A;  . GIVENS CAPSULE STUDY N/A 03/02/2018   Procedure: GIVENS CAPSULE STUDY;  Surgeon: Lin Landsman, MD;  Location: [facility] ENDOSCOPY;  Service: Gastroenterology;  Laterality: N/A;  . JOINT REPLACEMENT     right hip  . TOTAL HIP ARTHROPLASTY Left 05/16/2016   Procedure: LEFT TOTAL HIP ARTHROPLASTY ANTERIOR APPROACH;  Surgeon: Mcarthur Rossetti, MD;  Location: [facility] ORS;  Service: Orthopedics;  Laterality: Left;   Patient Active Problem List   Diagnosis Date Noted  . Folate or folic acid deficiency anemia 01/26/2018  . Severe sepsis ([place]) 11/11/2017  . PMB (postmenopausal bleeding) 12/04/2016  . Bilateral carpal tunnel syndrome 10/14/2016  . Healthcare maintenance 08/18/2016  . Unilateral primary osteoarthritis, left hip 05/16/2016  . Status post left hip replacement 05/16/2016  .  Iron deficiency anemia 02/27/2015  . Aortic atherosclerosis ([place]) 03/04/2014  . Sleep apnea 10/31/2013  . Hyperlipidemia, unspecified 09/23/2013  . 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 Hypertension, essential 08/27/2013  . COPD (chronic obstructive pulmonary  disease) ([place]) 08/27/2013  . Chronic undifferentiated schizophrenia ([place]) 08/27/2013      Prior to Admission medications   Medication Sig Start Date End Date Taking? Authorizing Provider  ACCU-CHEK AVIVA PLUS test strip use 1 TEST STRIP to TEST BLOOD SUGAR four times a day 12/17/17   [provider]  albuterol (PROVENTIL HFA;VENTOLIN HFA) 108 (90 BASE) MCG/ACT inhaler Inhale 2 puffs into the lungs every 4 (four) hours as needed for wheezing or shortness of breath.     [provider]  Blood Glucose Monitoring Suppl (ACCU-CHEK AVIVA PLUS) w/Device KIT See admin instructions. 12/19/17   [provider]  budesonide-formoterol (SYMBICORT) 160-4.5 MCG/ACT inhaler Inhale 2 puffs into the lungs 2 (two) times daily.    [provider]  Continuous Blood Gluc Receiver (FREESTYLE LIBRE 14 DAY READER) DEVI Use 1 applicator 4 (four) times daily 12/07/17   [provider]  diclofenac sodium (VOLTAREN) 1 % GEL APP 2 GRAMS EXT AA QID 03/17/18   [provider]  diltiazem (CARDIZEM CD) 180 MG 24 hr capsule Take 180 mg by mouth at bedtime.     [provider]  FERREX 150 150 MG capsule TAKE 1 CAPSULE BY MOUTH TWICE A DAY 02/21/18   Lloyd Huger, MD  folic acid (FOLVITE) 1 MG tablet TAKE 1 TABLET(1 MG) BY MOUTH DAILY 02/01/18   Lin Landsman, MD  hydrochlorothiazide (HYDRODIURIL) 25 MG tablet take 1 tablet by mouth every morning 01/27/13   [provider]  insulin glargine (LANTUS) 100 UNIT/ML injection Inject 0.1 mLs (10 Units total) into the skin at bedtime. 11/19/17   Bettey Costa, MD  insulin lispro (HUMALOG) 100 UNIT/ML injection Please check your sugars before each meal and use lispro per the below sliding scale:  For sugars 151-200: take 2 units For 201-250: take 4units For 251-300: take 6 units For 301-350: take 8 units For 351-400: take 10 units For > 401: call MD 07/20/17   Gladstone Lighter, MD  Insulin Syringe-Needle U-100  (BD INSULIN SYRINGE ULTRAFINE) 31G X 5/16" 0.5 ML MISC use as directed once daily 06/25/17   [provider]  ipratropium-albuterol (DUONEB) 0.5-2.5 (3) MG/3ML SOLN Take 3 mLs by nebulization every 6 (six) hours as needed.    [provider]  nicotine (NICODERM CQ - DOSED IN MG/24 HOURS) 21 mg/24hr patch Place 1 patch onto the skin daily. 08/07/17   [provider]  nitrofurantoin, macrocrystal-monohydrate, (MACROBID) 100 MG capsule  03/19/18   [provider]  nitroGLYCERIN (NITROSTAT) 0.4 MG SL tablet Place 1 tablet under the tongue every 5 (five) minutes as needed. 08/17/17   [provider]  oxyCODONE-acetaminophen (PERCOCET) 7.5-325 MG tablet Take 1-2 tablets by mouth every 6 (six) hours as needed for severe pain. 05/18/16   Mcarthur Rossetti, MD  promethazine (PHENERGAN) 25 MG tablet  04/05/18   [provider]  QUEtiapine (SEROQUEL) 50 MG tablet Take by mouth. 12/10/17   [provider]  triamcinolone cream (KENALOG) 0.1 % Apply 1 application topically 2 (two) times daily.    [provider]    Allergies Aspirin; Iodine; and Penicillins    Social History Social History  Tobacco Use  . Smoking status: Current Every Day Smoker    Packs/day: 1.00    Years: 47.00    Pack years: 47.00    Types: Cigarettes  . Smokeless tobacco: Never Used  Substance Use Topics  . Alcohol use: No  . Drug use: No    Review of Systems Patient denies headaches, rhinorrhea, blurry vision, numbness, shortness of breath, chest pain, edema, cough, abdominal pain, nausea, vomiting, diarrhea, dysuria, fevers, rashes or hallucinations unless otherwise stated above in HPI. ____________________________________________   PHYSICAL EXAM:  VITAL SIGNS: Vitals:   05/14/18 1851 05/14/18 1900  BP:  133/63  Pulse: 86 85  Temp:    SpO2: 95% 100%    Constitutional: Alert and oriented. Mild respiratory distress Eyes: Conjunctivae are  normal.  Head: Atraumatic. Nose: No congestion/rhinnorhea. Mouth/Throat: Mucous membranes are moist.   Neck: No stridor. Painless ROM.  Cardiovascular: Normal rate, regular rhythm. Grossly normal heart sounds.  Good peripheral circulation. Respiratory: tachypnea with diminished bs throghout and faint end expiratory wheeze Gastrointestinal: Soft and nontender. No distention. No abdominal bruits. No CVA tenderness. Genitourinary:  Musculoskeletal: No lower extremity tenderness nor edema.  No joint effusions. Neurologic:  Normal speech and language. No gross focal neurologic deficits are appreciated. No facial droop Skin:  Skin is warm, dry and intact. No rash noted. Psychiatric: Mood and affect are normal. Speech and behavior are normal.  ____________________________________________   LABS (all labs ordered are listed, but only abnormal results are displayed)  Results for orders placed or performed during the hospital encounter of 05/14/18 (from the past 24 hour(s))  Basic metabolic panel     Status: Abnormal   Collection Time: 05/14/18  3:08 PM  Result Value Ref Range   Sodium 137 135 - 145 mmol/L   Potassium 3.7 3.5 - 5.1 mmol/L   Chloride 101 98 - 111 mmol/L   CO2 28 22 - 32 mmol/L   Glucose, Bld 165 (H) 70 - 99 mg/dL   BUN 12 8 - 23 mg/dL   Creatinine, Ser 1.04 (H) 0.44 - 1.00 mg/dL   Calcium 7.7 (L) 8.9 - 10.3 mg/dL   GFR calc non Af Amer 58 (L) >60 mL/min   GFR calc Af Amer >60 >60 mL/min   Anion gap 8 5 - 15  CBC     Status: Abnormal   Collection Time: 05/14/18  3:08 PM  Result Value Ref Range   WBC 6.4 4.0 - 10.5 K/uL   RBC 4.33 3.87 - 5.11 MIL/uL   Hemoglobin 11.9 (L) 12.0 - 15.0 g/dL   HCT 38.2 36.0 - 46.0 %   MCV 88.2 80.0 - 100.0 fL   MCH 27.5 26.0 - 34.0 pg   MCHC 31.2 30.0 - 36.0 g/dL   RDW 14.6 11.5 - 15.5 %   Platelets 220 150 - 400 K/uL   nRBC 0.0 0.0 - 0.2 %  Troponin I - ONCE - STAT     Status: None   Collection Time: 05/14/18  3:08 PM  Result Value  Ref Range   Troponin I <0.03 <0.03 ng/mL  Brain natriuretic peptide     Status: None   Collection Time: 05/14/18  3:08 PM  Result Value Ref Range   B Natriuretic Peptide 45.0 0.0 - 100.0 pg/mL   ____________________________________________  EKG My review and personal interpretation at Time: 14:55   Indication: sob  Rate: 85  Rhythm: sinus Axis: normal Other: normal intervals, no stemi, no decreased voltage ____________________________________________  RADIOLOGY  I  personally reviewed all radiographic images ordered to evaluate for the above acute complaints and reviewed radiology reports and findings.  These findings were personally discussed with the patient.  Please see medical record for radiology report.  ____________________________________________   PROCEDURES  Procedure(s) performed:  .Critical Care Performed by: Merlyn Lot, MD Authorized by: Merlyn Lot, MD   Critical care provider statement:    Critical care time (minutes):  40   Critical care time was exclusive of:  Separately billable procedures and treating other patients   Critical care was necessary to treat or prevent imminent or life-threatening deterioration of the following conditions:  Respiratory failure   Critical care was time spent personally by me on the following activities:  Development of treatment plan with patient or surrogate, discussions with consultants, evaluation of patient's response to treatment, examination of patient, obtaining history from patient or surrogate, ordering and performing treatments and interventions, ordering and review of laboratory studies, ordering and review of radiographic studies, pulse oximetry, re-evaluation of patient's condition and review of old charts      Critical Care performed: yes ____________________________________________   INITIAL IMPRESSION / [place] / ED COURSE  Pertinent labs & imaging results that were available during my  care of the patient were reviewed by me and considered in my medical decision making (see chart for details).   DDX: Asthma, copd, CHF, pna, ptx, malignancy, Pe, anemia   Demitria B Befort is a 62 y.o. who presents to the ED with symptoms as described above.  Patient arrives in mild respiratory distress very tachypneic with diminished breath sounds and faint wheezing consistent with COPD exacerbation.  Chest x-ray does show possible interstitial edema developing.  No evidence of pericardial effusion and symptoms not consistent with those.  Will give nebulizer as well as steroids and reassess.  Clinical Course as of May 14 2101  Fri May 14, 2018  2005 Bedside echo does not show any evidence of pericardial effusion.  Does have bilateral B-lines.  No pleural effusion.  Did have some improvement after nebulizer treatment but noted to be hypoxic down to 90% while laying recumbent on supplemental oxygen.   [PR]  2057 Patient reassessed patient found to be significantly tachypneic with hypoxia to 84% patient placed back on supplemental oxygen of 4 L.  Does have wheezing.  I did improve therefore do not feel that she needs BiPAP but at this point is clear that she will require hospitalization for further respiratory management.  Will be given doxycycline COPD exacerbation.  Have discussed with the patient and available family all diagnostics and treatments performed thus far and all questions were answered to the best of my ability. The patient demonstrates understanding and agreement with plan.    [PR]    Clinical Course User Index [PR] Merlyn Lot, MD     As part of my medical decision making, I reviewed the following data within the [facility] notes reviewed and incorporated, Labs reviewed, notes from prior ED visits and [place] Controlled Substance Database   ____________________________________________   FINAL CLINICAL IMPRESSION(S) / ED DIAGNOSES  Final diagnoses:   COPD exacerbation ([place])  Acute respiratory failure with hypoxia ([place])      NEW MEDICATIONS STARTED DURING THIS VISIT:  New Prescriptions   No medications on file     Note:  This document was prepared using Dragon voice recognition software and may include unintentional dictation errors.    Merlyn Lot, MD 05/14/18 2102

## 2018-05-14 NOTE — ED Notes (Signed)
Pt up to bedside to void , sat dropped to 85% , pt placed back in bed and placed back on 02 [place] @ 2lt/min sat returned to 94%

## 2018-05-14 NOTE — H&P (Signed)
[place] [facility] [place] NAME: Deanna Gay    MR#:  [id number]  DATE OF BIRTH:  20-Nov-1955  DATE OF ADMISSION:  05/14/2018  PRIMARY CARE PHYSICIAN: Kirk Ruths, MD   REQUESTING/REFERRING PHYSICIAN: Quentin Cornwall, MD  CHIEF COMPLAINT:   Chief Complaint  Patient presents with  . Shortness of Breath    HISTORY OF PRESENT ILLNESS:  Deanna Gay  is a 62 y.o. female who presents with chief complaint as above.  Patient presents with several days progressive shortness of breath.  Work-up here in the ED was consistent with COPD with exacerbation.  No pneumonia seen on imaging and laboratory work-up otherwise reassuring.  Hospitalist were called for admission  PAST MEDICAL HISTORY:   Past Medical History:  Diagnosis Date  . Anemia   . Arthritis   . Cancer ([place])    cervical  . COPD (chronic obstructive pulmonary disease) ([place])   . COPD exacerbation ([place]) 07/19/2017  . Diabetes mellitus without complication ([place])   . GERD (gastroesophageal reflux disease)   . Hypertension   . Schizophrenia ([place])   . Sleep apnea      PAST SURGICAL HISTORY:   Past Surgical History:  Procedure Laterality Date  . APPENDECTOMY    . COLONOSCOPY WITH PROPOFOL N/A 12/25/2014   Procedure: COLONOSCOPY WITH PROPOFOL;  Surgeon: Josefine Class, MD;  Location: [facility] ENDOSCOPY;  Service: Endoscopy;  Laterality: N/A;  . COLONOSCOPY WITH PROPOFOL N/A 01/21/2018   Procedure: COLONOSCOPY WITH PROPOFOL;  Surgeon: Lin Landsman, MD;  Location: [facility] ENDOSCOPY;  Service: Gastroenterology;  Laterality: N/A;  . ESOPHAGOGASTRODUODENOSCOPY (EGD) WITH PROPOFOL N/A 12/25/2014   Procedure: ESOPHAGOGASTRODUODENOSCOPY (EGD) WITH PROPOFOL;  Surgeon: Josefine Class, MD;  Location: [facility] ENDOSCOPY;  Service: Endoscopy;  Laterality: N/A;  . ESOPHAGOGASTRODUODENOSCOPY (EGD) WITH PROPOFOL N/A 01/21/2018   Procedure: ESOPHAGOGASTRODUODENOSCOPY (EGD) WITH PROPOFOL;   Surgeon: Lin Landsman, MD;  Location: [facility] ENDOSCOPY;  Service: Gastroenterology;  Laterality: N/A;  . GIVENS CAPSULE STUDY N/A 03/02/2018   Procedure: GIVENS CAPSULE STUDY;  Surgeon: Lin Landsman, MD;  Location: [facility] ENDOSCOPY;  Service: Gastroenterology;  Laterality: N/A;  . JOINT REPLACEMENT     right hip  . TOTAL HIP ARTHROPLASTY Left 05/16/2016   Procedure: LEFT TOTAL HIP ARTHROPLASTY ANTERIOR APPROACH;  Surgeon: Mcarthur Rossetti, MD;  Location: [facility] ORS;  Service: Orthopedics;  Laterality: Left;     SOCIAL HISTORY:   Social History   Tobacco Use  . Smoking status: Current Every Day Smoker    Packs/day: 1.00    Years: 47.00    Pack years: 47.00    Types: Cigarettes  . Smokeless tobacco: Never Used  Substance Use Topics  . Alcohol use: No     FAMILY HISTORY:   Family History  Problem Relation Age of Onset  . Hypertension Mother   . Diabetes Mellitus II Mother   . Breast cancer Neg Hx   . Kidney cancer Neg Hx   . Bladder Cancer Neg Hx      DRUG ALLERGIES:   Allergies  Allergen Reactions  . Aspirin Other (See Comments)    Ongoing anemia  . Iodine Swelling  . Penicillins Swelling    Has patient had a PCN reaction causing immediate rash, facial/tongue/throat swelling, SOB or lightheadedness with hypotension: Yes Has patient had a PCN reaction causing severe rash involving mucus membranes or skin necrosis: Yes Has patient had a PCN reaction that required hospitalization: No Has patient had a PCN reaction  occurring within the last 10 years: No If all of the above answers are "NO", then may proceed with Cephalosporin use.     MEDICATIONS AT HOME:   Prior to Admission medications   Medication Sig Start Date End Date Taking? Authorizing Provider  ACCU-CHEK AVIVA PLUS test strip use 1 TEST STRIP to TEST BLOOD SUGAR four times a day 12/17/17   [provider]  albuterol (PROVENTIL HFA;VENTOLIN HFA) 108 (90 BASE) MCG/ACT inhaler Inhale 2  puffs into the lungs every 4 (four) hours as needed for wheezing or shortness of breath.     [provider]  Blood Glucose Monitoring Suppl (ACCU-CHEK AVIVA PLUS) w/Device KIT See admin instructions. 12/19/17   [provider]  budesonide-formoterol (SYMBICORT) 160-4.5 MCG/ACT inhaler Inhale 2 puffs into the lungs 2 (two) times daily.    [provider]  Continuous Blood Gluc Receiver (FREESTYLE LIBRE 14 DAY READER) DEVI Use 1 applicator 4 (four) times daily 12/07/17   [provider]  diclofenac sodium (VOLTAREN) 1 % GEL APP 2 GRAMS EXT AA QID 03/17/18   [provider]  diltiazem (CARDIZEM CD) 180 MG 24 hr capsule Take 180 mg by mouth at bedtime.     [provider]  FERREX 150 150 MG capsule TAKE 1 CAPSULE BY MOUTH TWICE A DAY 02/21/18   Lloyd Huger, MD  folic acid (FOLVITE) 1 MG tablet TAKE 1 TABLET(1 MG) BY MOUTH DAILY 02/01/18   Lin Landsman, MD  hydrochlorothiazide (HYDRODIURIL) 25 MG tablet take 1 tablet by mouth every morning 01/27/13   [provider]  insulin glargine (LANTUS) 100 UNIT/ML injection Inject 0.1 mLs (10 Units total) into the skin at bedtime. 11/19/17   Bettey Costa, MD  insulin lispro (HUMALOG) 100 UNIT/ML injection Please check your sugars before each meal and use lispro per the below sliding scale:  For sugars 151-200: take 2 units For 201-250: take 4units For 251-300: take 6 units For 301-350: take 8 units For 351-400: take 10 units For > 401: call MD 07/20/17   Gladstone Lighter, MD  Insulin Syringe-Needle U-100 (BD INSULIN SYRINGE ULTRAFINE) 31G X 5/16" 0.5 ML MISC use as directed once daily 06/25/17   [provider]  ipratropium-albuterol (DUONEB) 0.5-2.5 (3) MG/3ML SOLN Take 3 mLs by nebulization every 6 (six) hours as needed.    [provider]  nicotine (NICODERM CQ - DOSED IN MG/24 HOURS) 21 mg/24hr patch Place 1 patch onto the skin daily. 08/07/17   [provider]   nitrofurantoin, macrocrystal-monohydrate, (MACROBID) 100 MG capsule  03/19/18   [provider]  nitroGLYCERIN (NITROSTAT) 0.4 MG SL tablet Place 1 tablet under the tongue every 5 (five) minutes as needed. 08/17/17   [provider]  oxyCODONE-acetaminophen (PERCOCET) 7.5-325 MG tablet Take 1-2 tablets by mouth every 6 (six) hours as needed for severe pain. 05/18/16   Mcarthur Rossetti, MD  promethazine (PHENERGAN) 25 MG tablet  04/05/18   [provider]  QUEtiapine (SEROQUEL) 50 MG tablet Take by mouth. 12/10/17   [provider]  triamcinolone cream (KENALOG) 0.1 % Apply 1 application topically 2 (two) times daily.    [provider]    REVIEW OF SYSTEMS:  Review of Systems  Constitutional: Negative for chills, fever, malaise/fatigue and weight loss.  HENT: Negative for ear pain, hearing loss and tinnitus.   Eyes: Negative for blurred vision, double vision, pain and redness.  Respiratory: Positive for cough, shortness of breath and wheezing. Negative for hemoptysis.  Cardiovascular: Negative for chest pain, palpitations, orthopnea and leg swelling.  Gastrointestinal: Negative for abdominal pain, constipation, diarrhea, nausea and vomiting.  Genitourinary: Negative for dysuria, frequency and hematuria.  Musculoskeletal: Negative for back pain, joint pain and neck pain.  Skin:       No acne, rash, or lesions  Neurological: Negative for dizziness, tremors, focal weakness and weakness.  Endo/Heme/Allergies: Negative for polydipsia. Does not bruise/bleed easily.  Psychiatric/Behavioral: Negative for depression. The patient is not nervous/anxious and does not have insomnia.      VITAL SIGNS:   Vitals:   05/14/18 2030 05/14/18 2045 05/14/18 2100 05/14/18 2108  BP:      Pulse: 83 85 89 (!) 103  Temp:      TempSrc:      SpO2: 93% 95% 90% (!) 84%  Weight:      Height:       Wt Readings from Last 3 Encounters:  05/14/18 95.7 kg   04/27/18 98 kg  03/05/18 96.7 kg    PHYSICAL EXAMINATION:  Physical Exam  Vitals reviewed. Constitutional: She is oriented to person, place, and time. She appears well-developed and well-nourished. No distress.  HENT:  Head: Normocephalic and atraumatic.  Mouth/Throat: Oropharynx is clear and moist.  Eyes: Pupils are equal, round, and reactive to light. Conjunctivae and EOM are normal. No scleral icterus.  Neck: Normal range of motion. Neck supple. No JVD present. No thyromegaly present.  Cardiovascular: Normal rate, regular rhythm and intact distal pulses. Exam reveals no gallop and no friction rub.  No murmur heard. Respiratory: Effort normal. No respiratory distress. She has wheezes. She has no rales.  GI: Soft. Bowel sounds are normal. She exhibits no distension. There is no abdominal tenderness.  Musculoskeletal: Normal range of motion.        General: No edema.     Comments: No arthritis, no gout  Lymphadenopathy:    She has no cervical adenopathy.  Neurological: She is alert and oriented to person, place, and time. No cranial nerve deficit.  No dysarthria, no aphasia  Skin: Skin is warm and dry. No rash noted. No erythema.  Psychiatric: She has a normal mood and affect. Her behavior is normal. Judgment and thought content normal.    LABORATORY PANEL:   CBC Recent Labs  Lab 05/14/18 1508  WBC 6.4  HGB 11.9*  HCT 38.2  PLT 220   ------------------------------------------------------------------------------------------------------------------  Chemistries  Recent Labs  Lab 05/14/18 1508  NA 137  K 3.7  CL 101  CO2 28  GLUCOSE 165*  BUN 12  CREATININE 1.04*  CALCIUM 7.7*   ------------------------------------------------------------------------------------------------------------------  Cardiac Enzymes Recent Labs  Lab 05/14/18 1508  TROPONINI <0.03    ------------------------------------------------------------------------------------------------------------------  RADIOLOGY:  Dg Chest 2 View  Result Date: 05/14/2018 CLINICAL DATA:  62 year old female with shortness of breath for 3 days with nonproductive cough. EXAM: CHEST - 2 VIEW COMPARISON:  Portable chest 11/16/2017 and earlier. FINDINGS: Cardiomegaly appears mildly increased since March. Other mediastinal contours are stable and within normal limits. Visualized tracheal air column is within normal limits. Stable large lung volumes. Acute on chronic increased pulmonary interstitial markings. No pneumothorax, pleural effusion or confluent pulmonary opacity. No acute osseous abnormality identified. Negative visible bowel gas pattern. IMPRESSION: 1. Cardiomegaly appears mildly increased since March. Consider pericardial effusion. 2. Acute on chronic pulmonary interstitial opacity. Consider mild or developing pulmonary interstitial edema. viral/atypical respiratory infection felt less likely. No pleural effusion. Electronically Signed   By: Genevie Ann M.D.   On:  05/14/2018 15:49    EKG:   Orders placed or performed during the hospital encounter of 05/14/18  . EKG 12-Lead  . EKG 12-Lead  . ED EKG  . ED EKG    IMPRESSION AND PLAN:  Principal Problem:   COPD with acute exacerbation (HCC) -IV steroids, duo nebs, azithromycin, PRN antitussive, continue home nebulizers Active Problems:   Type 2 diabetes mellitus with chronic kidney disease (HCC) -sliding scale insulin coverage   Sleep apnea -CPAP nightly   Hypertension, essential -continue home dose antihypertensives   Hyperlipidemia -continue home dose antilipid  Chart review performed and case discussed with ED provider. Labs, imaging and/or ECG reviewed by provider and discussed with patient/family. Management plans discussed with the patient and/or family.  DVT PROPHYLAXIS: SubQ lovenox   GI PROPHYLAXIS:  None  ADMISSION STATUS:  Inpatient     CODE STATUS: Full Code Status History    Date Active Date Inactive Code Status Order ID Comments User Context   11/11/2017 1346 11/19/2017 1643 Full Code [id number]  Hillary Bow, MD ED   07/19/2017 1350 07/20/2017 1958 Full Code [id number]  Loletha Grayer, MD ED   05/16/2016 1626 05/20/2016 1456 Full Code [id number]  Mcarthur Rossetti, MD Inpatient   02/27/2015 0122 02/28/2015 1635 Full Code [id number]  Harrie Foreman, MD Inpatient      TOTAL TIME TAKING CARE OF THIS PATIENT: 45 minutes.   Edye Hainline Jefferson 05/14/2018, 9:20 PM  [employer]  [phone]  CC: Primary care physician; Kirk Ruths, MD  Note:  This document was prepared using Dragon voice recognition software and may include unintentional dictation errors.

## 2018-05-14 NOTE — ED Notes (Signed)
ED TO INPATIENT HANDOFF REPORT  Name/Age/Gender [name] 62 y.o. female  Code Status Code Status History    Date Active Date Inactive Code Status Order ID Comments User Context   11/11/2017 1346 11/19/2017 1643 Full Code [id number]  Hillary Bow, MD ED   07/19/2017 1350 07/20/2017 1958 Full Code [id number]  Loletha Grayer, MD ED   05/16/2016 1626 05/20/2016 1456 Full Code [id number]  Mcarthur Rossetti, MD Inpatient   02/27/2015 0122 02/28/2015 1635 Full Code [id number]  Harrie Foreman, MD Inpatient      Home/SNF/Other Home  Chief Complaint sob  Level of Care/Admitting Diagnosis ED Disposition    ED Disposition Condition [place]: [place] [[id number]]  Level of Care: Med-Surg [16]  Diagnosis: COPD with acute exacerbation [facility]) [[id number]]  Admitting Physician: Lance Coon [[id number]]  Attending Physician: Lance Coon [phone]  Estimated length of stay: past midnight tomorrow  Certification:: I certify this patient will need inpatient services for at least 2 midnights  PT Class (Do Not Modify): Inpatient [101]  PT Acc Code (Do Not Modify): Private [1]       Medical History Past Medical History:  Diagnosis Date  . Anemia   . Arthritis   . Cancer ([place])    cervical  . COPD (chronic obstructive pulmonary disease) ([place])   . COPD exacerbation ([place]) 07/19/2017  . Diabetes mellitus without complication ([place])   . GERD (gastroesophageal reflux disease)   . Hypertension   . Schizophrenia ([place])   . Sleep apnea     Allergies Allergies  Allergen Reactions  . Aspirin Other (See Comments)    Ongoing anemia  . Iodine Swelling  . Penicillins Swelling    Has patient had a PCN reaction causing immediate rash, facial/tongue/throat swelling, SOB or lightheadedness with hypotension: Yes Has patient had a PCN reaction causing severe rash involving mucus membranes or skin necrosis: Yes Has patient had a PCN reaction that  required hospitalization: No Has patient had a PCN reaction occurring within the last 10 years: No If all of the above answers are "NO", then may proceed with Cephalosporin use.     IV Location/Drains/Wounds Patient Lines/Drains/Airways Status   Active Line/Drains/Airways    Name:   Placement date:   Placement time:   Site:   Days:   Peripheral IV 05/14/18 Left Antecubital   05/14/18    1926    Antecubital   less than 1          Labs/Imaging Results for orders placed or performed during the hospital encounter of 05/14/18 (from the past 48 hour(s))  Basic metabolic panel     Status: Abnormal   Collection Time: 05/14/18  3:08 PM  Result Value Ref Range   Sodium 137 135 - 145 mmol/L   Potassium 3.7 3.5 - 5.1 mmol/L   Chloride 101 98 - 111 mmol/L   CO2 28 22 - 32 mmol/L   Glucose, Bld 165 (H) 70 - 99 mg/dL   BUN 12 8 - 23 mg/dL   Creatinine, Ser 1.04 (H) 0.44 - 1.00 mg/dL   Calcium 7.7 (L) 8.9 - 10.3 mg/dL   GFR calc non Af Amer 58 (L) >60 mL/min   GFR calc Af Amer >60 >60 mL/min   Anion gap 8 5 - 15    Comment: Performed at [facility], [address], [place], [place] [address]  CBC     Status: Abnormal   Collection Time: 05/14/18  3:08  PM  Result Value Ref Range   WBC 6.4 4.0 - 10.5 K/uL   RBC 4.33 3.87 - 5.11 MIL/uL   Hemoglobin 11.9 (L) 12.0 - 15.0 g/dL   HCT 38.2 36.0 - 46.0 %   MCV 88.2 80.0 - 100.0 fL   MCH 27.5 26.0 - 34.0 pg   MCHC 31.2 30.0 - 36.0 g/dL   RDW 14.6 11.5 - 15.5 %   Platelets 220 150 - 400 K/uL   nRBC 0.0 0.0 - 0.2 %    Comment: Performed at [facility], [address] [place], [place] [address]  Troponin I - ONCE - STAT     Status: None   Collection Time: 05/14/18  3:08 PM  Result Value Ref Range   Troponin I <0.03 <0.03 ng/mL    Comment: Performed at [facility], [address] [place], [place] [address]  Brain natriuretic peptide     Status: None   Collection Time: 05/14/18  3:08 PM  Result Value Ref Range    B Natriuretic Peptide 45.0 0.0 - 100.0 pg/mL    Comment: Performed at [facility], [address] [place], [place] [address]   Dg Chest 2 View  Result Date: 05/14/2018 CLINICAL DATA:  62 year old female with shortness of breath for 3 days with nonproductive cough. EXAM: CHEST - 2 VIEW COMPARISON:  Portable chest 11/16/2017 and earlier. FINDINGS: Cardiomegaly appears mildly increased since March. Other mediastinal contours are stable and within normal limits. Visualized tracheal air column is within normal limits. Stable large lung volumes. Acute on chronic increased pulmonary interstitial markings. No pneumothorax, pleural effusion or confluent pulmonary opacity. No acute osseous abnormality identified. Negative visible bowel gas pattern. IMPRESSION: 1. Cardiomegaly appears mildly increased since March. Consider pericardial effusion. 2. Acute on chronic pulmonary interstitial opacity. Consider mild or developing pulmonary interstitial edema. viral/atypical respiratory infection felt less likely. No pleural effusion. Electronically Signed   By: Genevie Ann M.D.   On: 05/14/2018 15:49    Pending Labs Unresulted Labs (From admission, onward)    Start     Ordered   05/14/18 2103  Influenza panel by PCR (type A & B)  (Influenza PCR Panel)  Once,   STAT     05/14/18 2102   Signed and Held  CBC  (enoxaparin (LOVENOX)    CrCl >/= 30 ml/min)  Once,   R    Comments:  Baseline for enoxaparin therapy IF NOT ALREADY DRAWN.  Notify MD if PLT < 100 K.    Signed and Held   Signed and Held  Creatinine, serum  (enoxaparin (LOVENOX)    CrCl >/= 30 ml/min)  Once,   R    Comments:  Baseline for enoxaparin therapy IF NOT ALREADY DRAWN.    Signed and Held   Signed and Held  Creatinine, serum  (enoxaparin (LOVENOX)    CrCl >/= 30 ml/min)  Weekly,   R    Comments:  while on enoxaparin therapy    Signed and Held   Signed and Held  Basic metabolic panel  Tomorrow morning,   R     Signed and Held   Signed  and Held  CBC  Tomorrow morning,   R     Signed and Held          Vitals/Pain Today's Vitals   05/14/18 2100 05/14/18 2108 05/14/18 2115 05/14/18 2130  BP:      Pulse: 89 (!) 103 91 (!) 102  Temp:  TempSrc:      SpO2: 90% (!) 84% 92% 91%  Weight:      Height:      PainSc:        Isolation Precautions Droplet precaution  Medications Medications  albuterol (PROVENTIL) (2.5 MG/3ML) 0.083% nebulizer solution 5 mg (5 mg Nebulization Given 05/14/18 1506)  ipratropium-albuterol (DUONEB) 0.5-2.5 (3) MG/3ML nebulizer solution 3 mL (3 mLs Nebulization Given 05/14/18 1903)  ipratropium-albuterol (DUONEB) 0.5-2.5 (3) MG/3ML nebulizer solution 3 mL (3 mLs Nebulization Given 05/14/18 1903)  methylPREDNISolone sodium succinate (SOLU-MEDROL) 125 mg/2 mL injection 125 mg (125 mg Intravenous Given 05/14/18 1928)  furosemide (LASIX) injection 60 mg (60 mg Intravenous Given 05/14/18 2039)  albuterol (PROVENTIL) (2.5 MG/3ML) 0.083% nebulizer solution 2.5 mg (2.5 mg Nebulization Given 05/14/18 2038)  doxycycline (VIBRA-TABS) tablet 100 mg (100 mg Oral Given 05/14/18 2144)    Mobility walks

## 2018-05-14 NOTE — ED Notes (Signed)
Pt in lobby loudly voicing her dissatisfaction with the wait time. Pt family member to desk to inquire about the wait. This RN informed family member that pt had 1 person who had been here longer than her and that she should be one of the next few people to be taken to a room if nothing more emergent comes in. Pt family member continued to voice her dissatisfaction that her sister was still waiting and that she came in for breathing problems and should not have to wait so long. RN informed her that if she was sent to waiting room after triage that the triage nurse felt the pt was stable enough to wait for a bed. Pt family member stated that she was going back to her car to sit and that the next time she came back in her sister "better not be sitting out here". Pt family member then went to [employer] officer in the lobby to voice her dissatisfaction as well.   Pt has been going outside during her wait in the lobby and no distress has been noted. Pt is able to speak in complete sentences while in the lobby.

## 2018-05-15 ENCOUNTER — Other Ambulatory Visit: Payer: Self-pay

## 2018-05-15 ENCOUNTER — Encounter: Payer: Self-pay | Admitting: *Deleted

## 2018-05-15 LAB — BASIC METABOLIC PANEL
Anion gap: 13 (ref 5–15)
BUN: 19 mg/dL (ref 8–23)
CO2: 27 mmol/L (ref 22–32)
Calcium: 7.6 mg/dL — ABNORMAL LOW (ref 8.9–10.3)
Chloride: 91 mmol/L — ABNORMAL LOW (ref 98–111)
Creatinine, Ser: 1.31 mg/dL — ABNORMAL HIGH (ref 0.44–1.00)
GFR calc non Af Amer: 44 mL/min — ABNORMAL LOW (ref >60)
GFR, EST AFRICAN AMERICAN: 50 mL/min — AB (ref >60)
Glucose, Bld: 477 mg/dL — ABNORMAL HIGH (ref 70–99)
Potassium: 3.9 mmol/L (ref 3.5–5.1)
Sodium: 131 mmol/L — ABNORMAL LOW (ref 135–145)

## 2018-05-15 LAB — CBC
HCT: 39.5 % (ref 36.0–46.0)
HEMOGLOBIN: 12.4 g/dL (ref 12.0–15.0)
MCH: 27.9 pg (ref 26.0–34.0)
MCHC: 31.4 g/dL (ref 30.0–36.0)
MCV: 89 fL (ref 80.0–100.0)
NRBC: 0 % (ref 0.0–0.2)
Platelets: 240 10*3/uL (ref 150–400)
RBC: 4.44 MIL/uL (ref 3.87–5.11)
RDW: 14.5 % (ref 11.5–15.5)
WBC: 7.5 10*3/uL (ref 4.0–10.5)

## 2018-05-15 LAB — GLUCOSE, CAPILLARY
Glucose-Capillary: 446 mg/dL — ABNORMAL HIGH (ref 70–99)
Glucose-Capillary: 464 mg/dL — ABNORMAL HIGH (ref 70–99)
Glucose-Capillary: 479 mg/dL — ABNORMAL HIGH (ref 70–99)
Glucose-Capillary: 558 mg/dL (ref 70–99)
Glucose-Capillary: 393 mg/dL — ABNORMAL HIGH (ref 70–99)

## 2018-05-15 MED ORDER — IPRATROPIUM-ALBUTEROL 0.5-2.5 (3) MG/3ML IN SOLN
3.0000 mL | Freq: Four times a day (QID) | RESPIRATORY_TRACT | Status: DC
  Administered 2018-05-15 – 2018-05-16 (×3): 3 mL via RESPIRATORY_TRACT
  Filled 2018-05-15 (×2): qty 3

## 2018-05-15 MED ORDER — INSULIN ASPART 100 UNIT/ML ~~LOC~~ SOLN
0.0000 [IU] | Freq: Three times a day (TID) | SUBCUTANEOUS | Status: DC
  Administered 2018-05-16: 16 [IU] via SUBCUTANEOUS
  Administered 2018-05-16: 20 [IU] via SUBCUTANEOUS
  Filled 2018-05-15 (×2): qty 1

## 2018-05-15 MED ORDER — INSULIN REGULAR HUMAN 100 UNIT/ML IJ SOLN
20.0000 [IU] | Freq: Once | INTRAMUSCULAR | Status: AC
  Administered 2018-05-15: 20 [IU] via INTRAVENOUS
  Filled 2018-05-15: qty 10

## 2018-05-15 MED ORDER — INSULIN ASPART 100 UNIT/ML ~~LOC~~ SOLN
14.0000 [IU] | Freq: Once | SUBCUTANEOUS | Status: AC
  Administered 2018-05-15: 14 [IU] via SUBCUTANEOUS
  Filled 2018-05-15: qty 1

## 2018-05-15 MED ORDER — INSULIN REGULAR HUMAN 100 UNIT/ML IJ SOLN
20.0000 [IU] | Freq: Once | INTRAMUSCULAR | Status: AC
  Administered 2018-05-16: 20 [IU] via INTRAVENOUS
  Filled 2018-05-15: qty 10

## 2018-05-15 MED ORDER — QUETIAPINE FUMARATE 25 MG PO TABS
50.0000 mg | ORAL_TABLET | Freq: Every day | ORAL | Status: DC
  Filled 2018-05-15: qty 2

## 2018-05-15 MED ORDER — ALUM & MAG HYDROXIDE-SIMETH 200-200-20 MG/5ML PO SUSP
15.0000 mL | ORAL | Status: DC | PRN
  Administered 2018-05-15: 15 mL via ORAL
  Filled 2018-05-15: qty 30

## 2018-05-15 MED ORDER — INSULIN ASPART 100 UNIT/ML ~~LOC~~ SOLN: 0.0000 [IU] | Freq: Three times a day (TID) | SUBCUTANEOUS | Status: DC

## 2018-05-15 MED ORDER — INSULIN ASPART 100 UNIT/ML ~~LOC~~ SOLN: 0.0000 [IU] | Freq: Every day | SUBCUTANEOUS | Status: DC

## 2018-05-15 MED ORDER — FUROSEMIDE 10 MG/ML IJ SOLN
40.0000 mg | Freq: Once | INTRAMUSCULAR | Status: AC
  Administered 2018-05-15: 40 mg via INTRAVENOUS
  Filled 2018-05-15: qty 4

## 2018-05-15 MED ORDER — INSULIN ASPART 100 UNIT/ML ~~LOC~~ SOLN
20.0000 [IU] | Freq: Once | SUBCUTANEOUS | Status: AC
  Administered 2018-05-15: 20 [IU] via SUBCUTANEOUS
  Filled 2018-05-15: qty 1

## 2018-05-15 MED ORDER — INSULIN ASPART 100 UNIT/ML ~~LOC~~ SOLN
12.0000 [IU] | Freq: Once | SUBCUTANEOUS | Status: AC
  Administered 2018-05-15: 12 [IU] via SUBCUTANEOUS
  Filled 2018-05-15: qty 1

## 2018-05-15 NOTE — Progress Notes (Signed)
Bs 558. Dr Jannifer Franklin made aware. New orders obtained.

## 2018-05-15 NOTE — Progress Notes (Signed)
[place] [facility] [place] NAME: Deanna Gay    MR#:  [id number]  DATE OF BIRTH:  1955/06/15  SUBJECTIVE:   Patient states she is feeling little bit better this morning.  Still feeling little short of breath.  Endorses cough.  No chest pain.  REVIEW OF SYSTEMS:  Review of Systems  Constitutional: Negative for chills and fever.  HENT: Negative for congestion and sore throat.   Eyes: Negative for blurred vision and double vision.  Respiratory: Positive for cough, sputum production and shortness of breath.   Cardiovascular: Negative for chest pain, palpitations and leg swelling.  Gastrointestinal: Negative for abdominal pain, nausea and vomiting.  Genitourinary: Negative for dysuria and urgency.  Musculoskeletal: Negative for back pain and neck pain.  Neurological: Negative for dizziness and headaches.  Psychiatric/Behavioral: Negative for depression. The patient is not nervous/anxious.     DRUG ALLERGIES:   Allergies  Allergen Reactions  . Aspirin Other (See Comments)    Ongoing anemia  . Iodine Swelling  . Penicillins Swelling    Has patient had a PCN reaction causing immediate rash, facial/tongue/throat swelling, SOB or lightheadedness with hypotension: Yes Has patient had a PCN reaction causing severe rash involving mucus membranes or skin necrosis: Yes Has patient had a PCN reaction that required hospitalization: No Has patient had a PCN reaction occurring within the last 10 years: No If all of the above answers are "NO", then may proceed with Cephalosporin use.    VITALS:  Blood pressure (!) 148/76, pulse 85, temperature (!) 97.5 F (36.4 C), temperature source Oral, resp. rate 18, height 5' 5"  (1.651 m), weight 95.7 kg, SpO2 96 %. PHYSICAL EXAMINATION:  Physical Exam  Constitutional: Well-appearing, sitting up in bed in no acute distress, able to speak in full sentences HEENT: Normal cephalic, atraumatic, EOMI, no scleral  icterus, moist mucous membranes.   Neck: Normal ROM Cardiovascular: RRR, no murmurs, rubs, gallops Respiratory: +diminished breath sounds throughout all lung fields, +occasional expiratory wheeze, [place] in place Gastrointestinal: +BS, soft, non-tender, non-distended Musculoskeletal: No lower extremity tenderness nor edema.   Neurologic:   Cranial nerves II through X grossly intact, no focal deficits, 5/5 muscle strength throughout, sensation intact. Skin:  Skin is warm, dry and intact. No rash noted. Psychiatric: Mood and affect are normal. Speech and behavior are normal. LABORATORY PANEL:  Female CBC Recent Labs  Lab 05/15/18 0511  WBC 7.5  HGB 12.4  HCT 39.5  PLT 240   ------------------------------------------------------------------------------------------------------------------ Chemistries  Recent Labs  Lab 05/15/18 0511  NA 131*  K 3.9  CL 91*  CO2 27  GLUCOSE 477*  BUN 19  CREATININE 1.31*  CALCIUM 7.6*   RADIOLOGY:  No results found. ASSESSMENT AND PLAN:   COPD with acute exacerbation-did not improving.  Patient still on 3 L O2 by nasal cannula.  -Continue IV steroids -Continue azithromycin -Continue home inhalers and Duonebs PRN  ?Pericardial effusion- CXR with increased cardiomegaly. Clinical picture is not consistent with significant pericardial effusion. -Check ECHO  Type 2 diabetes-sugars have been very high due to steroids -Increase SSI to resistant  CKD III- Cr close to baseline -Monitor -Avoid nephrotoxic agents  Sleep apnea- stable -CPAP nightly  Hypertension- stable -Continue home antihypertensives  Hyperlipidemia- stable -Continue home statin  All the records are reviewed and case discussed with Care Management/Social Worker. Management plans discussed with the patient, family and they are in agreement.  CODE STATUS: Full Code  TOTAL TIME TAKING CARE OF  THIS PATIENT: 40 minutes.   More than 50% of the time was spent in  counseling/coordination of care: YES  POSSIBLE D/C IN 1-2 DAYS, DEPENDING ON CLINICAL CONDITION.   Berna Spare Lyanna Blystone M.D on 05/15/2018 at 6:11 PM  Between 7am to 6pm - Pager [phone]  After 6pm go to [email or web address] - [employer] [facility]  Office  [phone]  CC: Primary care physician; Kirk Ruths, MD  Note: This dictation was prepared with Dragon dictation along with smaller phrase technology. Any transcriptional errors that result from this process are unintentional.

## 2018-05-15 NOTE — Plan of Care (Signed)
  Problem: Education: Goal: Knowledge of General Education information will improve Description Including pain rating scale, medication(s)/side effects and non-pharmacologic comfort measures Outcome: Progressing   Problem: Health Behavior/Discharge Planning: Goal: Ability to manage health-related needs will improve Outcome: Progressing   Problem: Clinical Measurements: Goal: Ability to maintain clinical measurements within normal limits will improve Outcome: Progressing Goal: Will remain free from infection Outcome: Progressing Goal: Respiratory complications will improve Outcome: Progressing   Problem: Activity: Goal: Risk for activity intolerance will decrease Outcome: Progressing   Problem: Elimination: Goal: Will not experience complications related to bowel motility Outcome: Progressing   Problem: Pain Managment: Goal: General experience of comfort will improve Outcome: Progressing

## 2018-05-15 NOTE — Progress Notes (Signed)
Patient has been requesting graham crackers and other food today, but has been complaining about her blood sugar being high. I explained that steroids can cause hunger and to drink plenty of water. Secretary went to give diet soft drink to patient and per her report, patient was eating a large chocolate bar.

## 2018-05-16 ENCOUNTER — Inpatient Hospital Stay
Admit: 2018-05-16 | Discharge: 2018-05-16 | Disposition: A | Discharge status: Home / Self Care | Payer: Medicare Other | Attending: Internal Medicine | Admitting: Internal Medicine

## 2018-05-16 LAB — BASIC METABOLIC PANEL
Anion gap: 11 (ref 5–15)
BUN: 30 mg/dL — ABNORMAL HIGH (ref 8–23)
CO2: 27 mmol/L (ref 22–32)
Calcium: 7.9 mg/dL — ABNORMAL LOW (ref 8.9–10.3)
Chloride: 95 mmol/L — ABNORMAL LOW (ref 98–111)
Creatinine, Ser: 1.25 mg/dL — ABNORMAL HIGH (ref 0.44–1.00)
GFR calc Af Amer: 53 mL/min — ABNORMAL LOW (ref >60)
GFR calc non Af Amer: 46 mL/min — ABNORMAL LOW (ref >60)
Glucose, Bld: 412 mg/dL — ABNORMAL HIGH (ref 70–99)
Potassium: 3.8 mmol/L (ref 3.5–5.1)
Sodium: 133 mmol/L — ABNORMAL LOW (ref 135–145)

## 2018-05-16 LAB — CBC
HCT: 37.7 % (ref 36.0–46.0)
HEMOGLOBIN: 11.9 g/dL — AB (ref 12.0–15.0)
MCH: 27.7 pg (ref 26.0–34.0)
MCHC: 31.6 g/dL (ref 30.0–36.0)
MCV: 87.9 fL (ref 80.0–100.0)
Platelets: 256 10*3/uL (ref 150–400)
RBC: 4.29 MIL/uL (ref 3.87–5.11)
RDW: 14.2 % (ref 11.5–15.5)
WBC: 12.8 10*3/uL — ABNORMAL HIGH (ref 4.0–10.5)
nRBC: 0 % (ref 0.0–0.2)

## 2018-05-16 LAB — ECHOCARDIOGRAM COMPLETE
Height: 65 in
Weight: 3376 oz

## 2018-05-16 LAB — GLUCOSE, CAPILLARY
GLUCOSE-CAPILLARY: 391 mg/dL — AB (ref 70–99)
Glucose-Capillary: 424 mg/dL — ABNORMAL HIGH (ref 70–99)
Glucose-Capillary: 442 mg/dL — ABNORMAL HIGH (ref 70–99)

## 2018-05-16 MED ORDER — INSULIN GLARGINE 100 UNIT/ML ~~LOC~~ SOLN
15.0000 [IU] | Freq: Every day | SUBCUTANEOUS | Status: DC
  Filled 2018-05-16: qty 0.15

## 2018-05-16 MED ORDER — DOXYCYCLINE HYCLATE 100 MG PO TBEC: 100.0000 mg | DELAYED_RELEASE_TABLET | Freq: Two times a day (BID) | ORAL | 0 refills | Status: AC

## 2018-05-16 MED ORDER — INSULIN ASPART 100 UNIT/ML ~~LOC~~ SOLN
5.0000 [IU] | Freq: Three times a day (TID) | SUBCUTANEOUS | Status: DC
  Administered 2018-05-16: 5 [IU] via SUBCUTANEOUS
  Filled 2018-05-16: qty 1

## 2018-05-16 MED ORDER — PREDNISONE 10 MG PO TABS: ORAL_TABLET | ORAL | 0 refills | Status: DC

## 2018-05-16 MED ORDER — INSULIN GLARGINE 100 UNIT/ML ~~LOC~~ SOLN
5.0000 [IU] | Freq: Once | SUBCUTANEOUS | Status: AC
  Administered 2018-05-16: 5 [IU] via SUBCUTANEOUS
  Filled 2018-05-16: qty 0.05

## 2018-05-16 MED ORDER — INSULIN GLARGINE 100 UNIT/ML ~~LOC~~ SOLN
10.0000 [IU] | Freq: Every day | SUBCUTANEOUS | Status: DC
  Filled 2018-05-16: qty 0.1

## 2018-05-16 NOTE — Progress Notes (Signed)
Inpatient Diabetes Program Recommendations  AACE/ADA: New Consensus Statement on Inpatient Glycemic Control (2015)  Target Ranges:  Prepandial:   less than 140 mg/dL      Peak postprandial:   less than 180 mg/dL (1-2 hours)      Critically ill patients:  140 - 180 mg/dL   Lab Results  Component Value Date   GLUCAP 424 (H) 05/16/2018   HGBA1C 7.6 (H) 11/11/2017    Review of Glycemic Control  Diabetes history: DM2 Outpatient Diabetes medications: Lantus 10 units QHS, Humalog 2-10 units tidwc. Current orders for Inpatient glycemic control: Lantus 15 units QHS, Novolog 0-20 units tidwc and hs + 5 units tidwc On Solumedrol 60 mg Q6H. Received only 5 units Lantus today at 0349. Received 46 units of Novolog and 40 units of Regular insulin on 12/29. Blood sugars today 391, 412, 424, likely d/t high-dose steroids.  Inpatient Diabetes Program Recommendations:     Agree with orders. Needs updated HgbA1C. Last one 11/11/2017 7.6%   Will follow CBG trends with steroids on board.  Thank you. Lorenda Peck, RD, LDN, CDE Inpatient Diabetes Coordinator [phone]

## 2018-05-16 NOTE — Progress Notes (Signed)
Patient is being discharged to home. IV removed. DC & RX instructions given and patient acknowledged understanding. NT will help patient get ready to leave and wheel her out to car.

## 2018-05-16 NOTE — Discharge Summary (Signed)
[facility] at [place] NAME: Ervin Rothbauer    MR#:  [id number]  DATE OF BIRTH:  04-Oct-1955  DATE OF ADMISSION:  05/14/2018 ADMITTING PHYSICIAN: Lance Coon, MD  DATE OF DISCHARGE: 05/16/2018  1:45 PM  PRIMARY CARE PHYSICIAN: Kirk Ruths, MD    ADMISSION DIAGNOSIS:  COPD exacerbation ([place]) [J44.1] Acute respiratory failure with hypoxia ([place]) [J96.01]  DISCHARGE DIAGNOSIS:  Principal Problem:   COPD with acute exacerbation ([place]) Active Problems:   Type 2 diabetes mellitus with chronic kidney disease ([place])   Sleep apnea   Hypertension, essential   Hyperlipidemia   SECONDARY DIAGNOSIS:   Past Medical History:  Diagnosis Date  . Anemia   . Arthritis   . Cancer ([place])    cervical  . COPD (chronic obstructive pulmonary disease) ([place])   . COPD exacerbation ([place]) 07/19/2017  . Diabetes mellitus without complication ([place])   . GERD (gastroesophageal reflux disease)   . Hypertension   . Schizophrenia ([place])   . Sleep apnea     HOSPITAL COURSE:   62 year old female with past medical history of schizophrenia, hypertension, GERD, diabetes, COPD with ongoing tobacco abuse, history of cervical cancer, obstructive sleep apnea who presented to the hospital secondary to shortness of breath and noted to be in COPD exacerbation.  # COPD with acute exacerbation-due to ongoing tobacco abuse and also underlying bronchitis. -Patient was treated with IV steroids, scheduled duo nebs, Pulmicort nebs.  She has clinically improved.  She has less wheezing and bronchospasm.  She will now be discharged on oral prednisone taper and empiric doxycycline for a few more days.  She will continue her maintenance inhalers as stated below along with her duo nebs as needed.  She was strongly advised to abstain from smoking.  # ?Pericardial effusion- CXR with increased cardiomegaly. Clinical picture was not consistent with significant pericardial  effusion. -She had an echocardiogram done which showed normal ejection fraction and no evidence of a pericardial effusion.  # Type 2 diabetes-patient's blood sugars were somewhat uncontrolled due to her being on IV steroids.  Patient's steroids are being quickly tapered as an outpatient.  She will resume her diabetic regimen as stated below.  Further changes to her meds can be done as an outpatient.  # CKD III- Cr close to baseline - cont. To Monitor as outpatient.  -Avoid nephrotoxic agents  # Sleep apnea- stable - cont. CPAP nightly  # Hypertension- stable - cont. Cardizem, hCTZ    DISCHARGE CONDITIONS:   Stable.   CONSULTS OBTAINED:    DRUG ALLERGIES:   Allergies  Allergen Reactions  . Aspirin Other (See Comments)    Ongoing anemia  . Iodine Swelling  . Penicillins Swelling    Has patient had a PCN reaction causing immediate rash, facial/tongue/throat swelling, SOB or lightheadedness with hypotension: Yes Has patient had a PCN reaction causing severe rash involving mucus membranes or skin necrosis: Yes Has patient had a PCN reaction that required hospitalization: No Has patient had a PCN reaction occurring within the last 10 years: No If all of the above answers are "NO", then may proceed with Cephalosporin use.     DISCHARGE MEDICATIONS:   Allergies as of 05/16/2018      Reactions   Aspirin Other (See Comments)   Ongoing anemia   Iodine Swelling   Penicillins Swelling   Has patient had a PCN reaction causing immediate rash, facial/tongue/throat swelling, SOB or lightheadedness with hypotension: Yes Has patient had a  PCN reaction causing severe rash involving mucus membranes or skin necrosis: Yes Has patient had a PCN reaction that required hospitalization: No Has patient had a PCN reaction occurring within the last 10 years: No If all of the above answers are "NO", then may proceed with Cephalosporin use.      Medication List    TAKE these medications    ACCU-CHEK AVIVA PLUS test strip Generic drug:  glucose blood use 1 TEST STRIP to TEST BLOOD SUGAR four times a day   ACCU-CHEK AVIVA PLUS w/Device Kit See admin instructions.   albuterol 108 (90 Base) MCG/ACT inhaler Commonly known as:  PROVENTIL HFA;VENTOLIN HFA Inhale 2 puffs into the lungs every 4 (four) hours as needed for wheezing or shortness of breath.   BD INSULIN SYRINGE ULTRAFINE 31G X 5/16" 0.5 ML Misc Generic drug:  Insulin Syringe-Needle U-100 use as directed once daily   budesonide-formoterol 160-4.5 MCG/ACT inhaler Commonly known as:  SYMBICORT Inhale 2 puffs into the lungs 2 (two) times daily.   diclofenac sodium 1 % Gel Commonly known as:  VOLTAREN APP 2 GRAMS EXT AA QID   diltiazem 180 MG 24 hr capsule Commonly known as:  CARDIZEM CD Take 180 mg by mouth at bedtime.   doxycycline 100 MG EC tablet Commonly known as:  DORYX Take 1 tablet (100 mg total) by mouth 2 (two) times daily for 5 days.   FERREX 150 150 MG capsule Generic drug:  iron polysaccharides TAKE 1 CAPSULE BY MOUTH TWICE A DAY   folic acid 1 MG tablet Commonly known as:  FOLVITE TAKE 1 TABLET(1 MG) BY MOUTH DAILY   hydrochlorothiazide 25 MG tablet Commonly known as:  HYDRODIURIL take 1 tablet by mouth every morning   insulin glargine 100 UNIT/ML injection Commonly known as:  LANTUS Inject 0.1 mLs (10 Units total) into the skin at bedtime.   insulin lispro 100 UNIT/ML injection Commonly known as:  HUMALOG Please check your sugars before each meal and use lispro per the below sliding scale:  For sugars 151-200: take 2 units For 201-250: take 4units For 251-300: take 6 units For 301-350: take 8 units For 351-400: take 10 units For > 401: call MD   ipratropium-albuterol 0.5-2.5 (3) MG/3ML Soln Commonly known as:  DUONEB Take 3 mLs by nebulization every 6 (six) hours as needed.   nicotine 21 mg/24hr patch Commonly known as:  NICODERM CQ - dosed in mg/24 hours Place 1 patch  onto the skin daily.   nitroGLYCERIN 0.4 MG SL tablet Commonly known as:  NITROSTAT Place 1 tablet under the tongue every 5 (five) minutes as needed.   oxyCODONE-acetaminophen 7.5-325 MG tablet Commonly known as:  PERCOCET Take 1-2 tablets by mouth every 6 (six) hours as needed for severe pain.   predniSONE 10 MG tablet Commonly known as:  DELTASONE Label  & dispense according to the schedule below. 5 Pills PO for 1 day then, 4 Pills PO for 1 day, 3 Pills PO for 1 day, 2 Pills PO for 1 day, 1 Pill PO for 1 days then STOP.   promethazine 25 MG tablet Commonly known as:  PHENERGAN Take 25 mg by mouth every 4 (four) hours as needed.   triamcinolone cream 0.1 % Commonly known as:  KENALOG Apply 1 application topically 2 (two) times daily.         DISCHARGE INSTRUCTIONS:   DIET:  Cardiac diet and Diabetic diet  DISCHARGE CONDITION:  Stable  ACTIVITY:  Activity as tolerated  OXYGEN:  Home Oxygen: No.   Oxygen Delivery: room air  DISCHARGE LOCATION:  home   If you experience worsening of your admission symptoms, develop shortness of breath, life threatening emergency, suicidal or homicidal thoughts you must seek medical attention immediately by calling 911 or calling your MD immediately  if symptoms less severe.  You Must read complete instructions/literature along with all the possible adverse reactions/side effects for all the Medicines you take and that have been prescribed to you. Take any new Medicines after you have completely understood and accpet all the possible adverse reactions/side effects.   Please note  You were cared for by a hospitalist during your hospital stay. If you have any questions about your discharge medications or the care you received while you were in the hospital after you are discharged, you can call the unit and asked to speak with the hospitalist on call if the hospitalist that took care of you is not available. Once you are discharged,  your primary care physician will handle any further medical issues. Please note that NO REFILLS for any discharge medications will be authorized once you are discharged, as it is imperative that you return to your primary care physician (or establish a relationship with a primary care physician if you do not have one) for your aftercare needs so that they can reassess your need for medications and monitor your lab values.     Today   Shortness of breath, wheezing improved since admission.  Blood sugar somewhat uncontrolled due to IV steroids but patient is clinically asymptomatic.  Ambulated and is somewhat hypoxic but does not want home oxygen.  Will discharge home today.  VITAL SIGNS:  Blood pressure 135/72, pulse 78, temperature (!) 97.5 F (36.4 C), temperature source Oral, resp. rate 18, height _0  (1.651 m), weight 95.7 kg, SpO2 97 %.  I/O:  No intake or output data in the 24 hours ending 05/16/18 1527  PHYSICAL EXAMINATION:  GENERAL:  62 y.o.-year-old patient lying in the bed with no acute distress.  EYES: Pupils equal, round, reactive to light and accommodation. No scleral icterus. Extraocular muscles intact.  HEENT: Head atraumatic, normocephalic. Oropharynx and nasopharynx clear.  NECK:  Supple, no jugular venous distention. No thyroid enlargement, no tenderness.  LUNGS: Good air entry bilaterally, minimal end expiratory wheezing bilaterally, no rales, rhonchi.  Negative use of accessory muscles. CARDIOVASCULAR: S1, S2 normal. No murmurs, rubs, or gallops.  ABDOMEN: Soft, non-tender, non-distended. Bowel sounds present. No organomegaly or mass.  EXTREMITIES: No pedal edema, cyanosis, or clubbing.  NEUROLOGIC: Cranial nerves II through XII are intact. No focal motor or sensory defecits b/l.  PSYCHIATRIC: The patient is alert and oriented x 3.  SKIN: No obvious rash, lesion, or ulcer.   DATA REVIEW:   CBC Recent Labs  Lab 05/16/18 0409  WBC 12.8*  HGB 11.9*  HCT 37.7   PLT 256    Chemistries  Recent Labs  Lab 05/16/18 0409  NA 133*  K 3.8  CL 95*  CO2 27  GLUCOSE 412*  BUN 30*  CREATININE 1.25*  CALCIUM 7.9*    Cardiac Enzymes Recent Labs  Lab 05/14/18 1508  TROPONINI <0.03    Microbiology Results  Results for orders placed or performed in visit on 12/04/17  Microscopic Examination     Status: Abnormal   Collection Time: 12/04/17  8:43 AM  Result Value Ref Range Status   WBC, UA 11-30 (A) 0 - 5 /hpf Final   RBC, UA  0-2 0 - 2 /hpf Final   Epithelial Cells (non renal) >10 (H) 0 - 10 /hpf Final   Mucus, UA Present (A) Not Estab. Final   Bacteria, UA Moderate (A) None seen/Few Final  CULTURE, URINE COMPREHENSIVE     Status: Abnormal   Collection Time: 12/04/17  3:03 PM  Result Value Ref Range Status   Urine Culture, Comprehensive Final report (A)  Final   Organism ID, Bacteria Comment (A)  Final    Comment: Streptococcus mitis/oralis group 50,000-100,000 colony forming units per mL Susceptibility not normally performed on this organism.    Organism ID, Bacteria Comment  Final    Comment: Mixed urogenital flora Less than 10,000 colonies/mL     RADIOLOGY:  Dg Chest 2 View  Result Date: 05/14/2018 CLINICAL DATA:  62 year old female with shortness of breath for 3 days with nonproductive cough. EXAM: CHEST - 2 VIEW COMPARISON:  Portable chest 11/16/2017 and earlier. FINDINGS: Cardiomegaly appears mildly increased since March. Other mediastinal contours are stable and within normal limits. Visualized tracheal air column is within normal limits. Stable large lung volumes. Acute on chronic increased pulmonary interstitial markings. No pneumothorax, pleural effusion or confluent pulmonary opacity. No acute osseous abnormality identified. Negative visible bowel gas pattern. IMPRESSION: 1. Cardiomegaly appears mildly increased since March. Consider pericardial effusion. 2. Acute on chronic pulmonary interstitial opacity. Consider mild or  developing pulmonary interstitial edema. viral/atypical respiratory infection felt less likely. No pleural effusion. Electronically Signed   By: Genevie Ann M.D.   On: 05/14/2018 15:49      Management plans discussed with the patient, family and they are in agreement.  CODE STATUS:     Code Status Orders  (From admission, onward)         Start     Ordered   05/14/18 2228  Full code  Continuous     05/14/18 2227          TOTAL TIME TAKING CARE OF THIS PATIENT: 40 minutes.    Henreitta Leber M.D on 05/16/2018 at 3:27 PM  Between 7am to 6pm - Pager - [phone]  After 6pm go to [email or web address] - [employer] [facility]  Office  [phone]  CC: Primary care physician; Kirk Ruths, MD

## 2018-05-16 NOTE — Plan of Care (Signed)
  Problem: Clinical Measurements: Goal: Respiratory complications will improve Outcome: Progressing   Problem: Activity: Goal: Risk for activity intolerance will decrease Outcome: Progressing   Problem: Pain Managment: Goal: General experience of comfort will improve Outcome: Progressing   

## 2018-05-16 NOTE — Progress Notes (Signed)
SATURATION QUALIFICATIONS: (This note is used to comply with regulatory documentation for home oxygen)  Patient Saturations on Room Air at Rest = 91%  Patient Saturations on Room Air while Ambulating = 88%  Patient Saturations on 2 Liters of oxygen while Ambulating = 91%  Please briefly explain why patient needs home oxygen:  Patient desats while ambulating.

## 2018-05-16 NOTE — Progress Notes (Signed)
BS 391. Dr Marcille Blanco made aware. New order received.

## 2018-05-29 NOTE — Progress Notes (Signed)
[place]  [phone] Fax:(336) [phone]  ID: Deanna Gay OB: 1955/10/18  MR#: [id number]  XBL#:[id number]  Patient Care Team: Kirk Ruths, MD as PCP - General (Internal Medicine)  CHIEF COMPLAINT: Iron deficiency anemia.  INTERVAL HISTORY: Patient returns to clinic today for repeat laboratory work and further evaluation.  She has persistent shortness of breath, but otherwise feels well.  She does not complain of any weakness or fatigue. She has no neurologic complaints.  She denies any recent fevers or illnesses. She has no chest pain, cough, or hemoptysis. She denies any nausea, vomiting, constipation, or diarrhea. She has no melena or hematochezia.  She has no urinary complaints.  Patient offers no further specific complaints today.  REVIEW OF SYSTEMS:   Review of Systems  Constitutional: Negative.  Negative for fever, malaise/fatigue and weight loss.  Respiratory: Positive for shortness of breath. Negative for cough and hemoptysis.   Cardiovascular: Negative.  Negative for chest pain and leg swelling.  Gastrointestinal: Negative.  Negative for abdominal pain, blood in stool and melena.  Genitourinary: Negative.  Negative for frequency and hematuria.  Musculoskeletal: Negative.  Negative for myalgias.  Skin: Negative.  Negative for rash.  Neurological: Negative.  Negative for dizziness, focal weakness, weakness and headaches.  Psychiatric/Behavioral: Negative.  The patient is not nervous/anxious.     As per HPI. Otherwise, a complete review of systems is negative.  PAST MEDICAL HISTORY: Past Medical History:  Diagnosis Date  . Anemia   . Arthritis   . Cancer ([place])    cervical  . COPD (chronic obstructive pulmonary disease) ([place])   . COPD exacerbation ([place]) 07/19/2017  . Diabetes mellitus without complication ([place])   . GERD (gastroesophageal reflux disease)   . Hypertension   . Schizophrenia ([place])   . Sleep apnea     PAST  SURGICAL HISTORY: Past Surgical History:  Procedure Laterality Date  . APPENDECTOMY    . COLONOSCOPY WITH PROPOFOL N/A 12/25/2014   Procedure: COLONOSCOPY WITH PROPOFOL;  Surgeon: Josefine Class, MD;  Location: [facility] ENDOSCOPY;  Service: Endoscopy;  Laterality: N/A;  . COLONOSCOPY WITH PROPOFOL N/A 01/21/2018   Procedure: COLONOSCOPY WITH PROPOFOL;  Surgeon: Lin Landsman, MD;  Location: [facility] ENDOSCOPY;  Service: Gastroenterology;  Laterality: N/A;  . ESOPHAGOGASTRODUODENOSCOPY (EGD) WITH PROPOFOL N/A 12/25/2014   Procedure: ESOPHAGOGASTRODUODENOSCOPY (EGD) WITH PROPOFOL;  Surgeon: Josefine Class, MD;  Location: [facility] ENDOSCOPY;  Service: Endoscopy;  Laterality: N/A;  . ESOPHAGOGASTRODUODENOSCOPY (EGD) WITH PROPOFOL N/A 01/21/2018   Procedure: ESOPHAGOGASTRODUODENOSCOPY (EGD) WITH PROPOFOL;  Surgeon: Lin Landsman, MD;  Location: [facility] ENDOSCOPY;  Service: Gastroenterology;  Laterality: N/A;  . GIVENS CAPSULE STUDY N/A 03/02/2018   Procedure: GIVENS CAPSULE STUDY;  Surgeon: Lin Landsman, MD;  Location: [facility] ENDOSCOPY;  Service: Gastroenterology;  Laterality: N/A;  . JOINT REPLACEMENT     right hip  . TOTAL HIP ARTHROPLASTY Left 05/16/2016   Procedure: LEFT TOTAL HIP ARTHROPLASTY ANTERIOR APPROACH;  Surgeon: Mcarthur Rossetti, MD;  Location: [facility] ORS;  Service: Orthopedics;  Laterality: Left;    FAMILY HISTORY Family History  Problem Relation Age of Onset  . Hypertension Mother   . Diabetes Mellitus II Mother   . Breast cancer Neg Hx   . Kidney cancer Neg Hx   . Bladder Cancer Neg Hx        ADVANCED DIRECTIVES:    HEALTH MAINTENANCE: Social History   Tobacco Use  . Smoking status: Current Every Day Smoker    Packs/day: 1.00  Years: 47.00    Pack years: 47.00    Types: Cigarettes  . Smokeless tobacco: Never Used  Substance Use Topics  . Alcohol use: No  . Drug use: No     Colonoscopy:  PAP:  Bone density:  Lipid panel:  Allergies  Allergen  Reactions  . Aspirin Other (See Comments)    Ongoing anemia  . Iodine Swelling  . Penicillins Swelling    Has patient had a PCN reaction causing immediate rash, facial/tongue/throat swelling, SOB or lightheadedness with hypotension: Yes Has patient had a PCN reaction causing severe rash involving mucus membranes or skin necrosis: Yes Has patient had a PCN reaction that required hospitalization: No Has patient had a PCN reaction occurring within the last 10 years: No If all of the above answers are "NO", then may proceed with Cephalosporin use.     Current Outpatient Medications  Medication Sig Dispense Refill  . ACCU-CHEK AVIVA PLUS test strip use 1 TEST STRIP to TEST BLOOD SUGAR four times a day  0  . albuterol (PROVENTIL HFA;VENTOLIN HFA) 108 (90 BASE) MCG/ACT inhaler Inhale 2 puffs into the lungs every 4 (four) hours as needed for wheezing or shortness of breath.     . Blood Glucose Monitoring Suppl (ACCU-CHEK AVIVA PLUS) w/Device KIT See admin instructions.  0  . budesonide-formoterol (SYMBICORT) 160-4.5 MCG/ACT inhaler Inhale 2 puffs into the lungs 2 (two) times daily.    . diclofenac sodium (VOLTAREN) 1 % GEL APP 2 GRAMS EXT AA QID  11  . diltiazem (CARDIZEM CD) 180 MG 24 hr capsule Take 180 mg by mouth at bedtime.     Marland Kitchen FERREX 150 150 MG capsule TAKE 1 CAPSULE BY MOUTH TWICE A DAY 382 capsule 0  . folic acid (FOLVITE) 1 MG tablet TAKE 1 TABLET(1 MG) BY MOUTH DAILY 90 tablet 0  . hydrochlorothiazide (HYDRODIURIL) 25 MG tablet take 1 tablet by mouth every morning    . insulin glargine (LANTUS) 100 UNIT/ML injection Inject 0.1 mLs (10 Units total) into the skin at bedtime. 10 mL 11  . insulin lispro (HUMALOG) 100 UNIT/ML injection Please check your sugars before each meal and use lispro per the below sliding scale:  For sugars 151-200: take 2 units For 201-250: take 4units For 251-300: take 6 units For 301-350: take 8 units For 351-400: take 10 units For > 401: call MD 10 mL 11    . Insulin Syringe-Needle U-100 (BD INSULIN SYRINGE ULTRAFINE) 31G X 5/16" 0.5 ML MISC use as directed once daily    . ipratropium-albuterol (DUONEB) 0.5-2.5 (3) MG/3ML SOLN Take 3 mLs by nebulization every 6 (six) hours as needed.    . nicotine (NICODERM CQ - DOSED IN MG/24 HOURS) 21 mg/24hr patch Place 1 patch onto the skin daily.  0  . nitroGLYCERIN (NITROSTAT) 0.4 MG SL tablet Place 1 tablet under the tongue every 5 (five) minutes as needed.  0  . omeprazole (PRILOSEC) 20 MG capsule Take 20 mg by mouth 2 (two) times daily.    Marland Kitchen oxyCODONE-acetaminophen (PERCOCET) 7.5-325 MG tablet Take 1-2 tablets by mouth every 6 (six) hours as needed for severe pain. 30 tablet 0  . predniSONE (DELTASONE) 10 MG tablet Label  & dispense according to the schedule below. 5 Pills PO for 1 day then, 4 Pills PO for 1 day, 3 Pills PO for 1 day, 2 Pills PO for 1 day, 1 Pill PO for 1 days then STOP. 15 tablet 0  . promethazine (PHENERGAN) 25 MG  tablet Take 25 mg by mouth every 4 (four) hours as needed.   0  . triamcinolone cream (KENALOG) 0.1 % Apply 1 application topically 2 (two) times daily.     No current facility-administered medications for this visit.     OBJECTIVE: Vitals:   06/04/18 0830  BP: (!) 170/84  Pulse: 90  Temp: (!) 97 F (36.1 C)     Body mass index is 35.9 kg/m.    ECOG FS:0 - Asymptomatic  General: Well-developed, well-nourished, no acute distress. Eyes: Pink conjunctiva, anicteric sclera. HEENT: Normocephalic, moist mucous membranes. Lungs: Clear to auscultation bilaterally. Heart: Regular rate and rhythm. No rubs, murmurs, or gallops. Abdomen: Soft, nontender, nondistended. No organomegaly noted, normoactive bowel sounds. Musculoskeletal: No edema, cyanosis, or clubbing. Neuro: Alert, answering all questions appropriately. Cranial nerves grossly intact. Skin: No rashes or petechiae noted. Psych: Normal affect.  LAB RESULTS:  Lab Results  Component Value Date   NA 135  06/02/2018   K 3.9 06/02/2018   CL 98 06/02/2018   CO2 27 06/02/2018   GLUCOSE 318 (H) 06/02/2018   BUN 17 06/02/2018   CREATININE 1.21 (H) 06/02/2018   CALCIUM 8.4 (L) 06/02/2018   PROT 5.9 (L) 11/15/2017   ALBUMIN 1.9 (L) 11/18/2017   AST 12 (L) 11/15/2017   ALT 13 11/15/2017   ALKPHOS 71 11/15/2017   BILITOT 0.3 11/15/2017   GFRNONAA 48 (L) 06/02/2018   GFRAA 56 (L) 06/02/2018    Lab Results  Component Value Date   WBC 5.6 06/02/2018   NEUTROABS 3.8 06/02/2018   HGB 12.6 06/02/2018   HCT 40.1 06/02/2018   MCV 91.1 06/02/2018   PLT 202 06/02/2018   Lab Results  Component Value Date   FERRITIN 174 05/11/2018   Lab Results  Component Value Date   IRON 49 05/11/2018   TIBC 341 05/11/2018   IRONPCTSAT 14 05/11/2018      STUDIES: Dg Chest 2 View  Result Date: 05/14/2018 CLINICAL DATA:  63 year old female with shortness of breath for 3 days with nonproductive cough. EXAM: CHEST - 2 VIEW COMPARISON:  Portable chest 11/16/2017 and earlier. FINDINGS: Cardiomegaly appears mildly increased since March. Other mediastinal contours are stable and within normal limits. Visualized tracheal air column is within normal limits. Stable large lung volumes. Acute on chronic increased pulmonary interstitial markings. No pneumothorax, pleural effusion or confluent pulmonary opacity. No acute osseous abnormality identified. Negative visible bowel gas pattern. IMPRESSION: 1. Cardiomegaly appears mildly increased since March. Consider pericardial effusion. 2. Acute on chronic pulmonary interstitial opacity. Consider mild or developing pulmonary interstitial edema. viral/atypical respiratory infection felt less likely. No pleural effusion. Electronically Signed   By: Genevie Ann M.D.   On: 05/14/2018 15:49    ASSESSMENT: Iron deficiency anemia.  PLAN:    1. Iron deficiency anemia: Patient's hemoglobin and iron stores continue to be within normal limits. She does not require a blood transfusion  or IV iron today.  Her last infusion of IV Feraheme occurred on January 15, 2018.  Colonoscopy on January 21, 2018 revealed 6 polyps that were negative for malignancy.  Recommendation was to repeat in 3 years.  EGD did not reveal any evidence of bleeding.  Patient reports her capsule endoscopy was also normal, but we do not have these results.  No intervention is needed.  Return to clinic in 4 months with repeat laboratory work and further evaluation.  2. Hyperglycemia: Patient's blood glucose remains significantly elevated and most recent result was 318.  Continue insulin  and diabetic medications as prescribed. 3.  Chronic renal insufficiency: Patient's creatinine is mildly increased at 1.21 which is approximately her baseline.  Monitor. 4.  Shortness of breath: Possibly related to underlying COPD.  Continue follow-up and treatment per primary care. 5.  Smoking cessation: Patient continues to smoke heavily and was offered counseling which she has declined.  Patient expressed understanding and was in agreement with this plan. She also understands that She can call clinic at any time with any questions, concerns, or complaints.    Lloyd Huger, MD   06/04/2018 11:01 AM

## 2018-06-02 ENCOUNTER — Inpatient Hospital Stay: Payer: Medicare Other | Attending: Oncology

## 2018-06-02 ENCOUNTER — Other Ambulatory Visit: Payer: Self-pay | Admitting: *Deleted

## 2018-06-02 DIAGNOSIS — F17210 Nicotine dependence, cigarettes, uncomplicated: Secondary | ICD-10-CM | POA: Insufficient documentation

## 2018-06-02 DIAGNOSIS — E1165 Type 2 diabetes mellitus with hyperglycemia: Secondary | ICD-10-CM | POA: Insufficient documentation

## 2018-06-02 DIAGNOSIS — Z79899 Other long term (current) drug therapy: Secondary | ICD-10-CM | POA: Insufficient documentation

## 2018-06-02 DIAGNOSIS — Z794 Long term (current) use of insulin: Secondary | ICD-10-CM | POA: Insufficient documentation

## 2018-06-02 DIAGNOSIS — D509 Iron deficiency anemia, unspecified: Secondary | ICD-10-CM | POA: Diagnosis present

## 2018-06-02 DIAGNOSIS — N189 Chronic kidney disease, unspecified: Secondary | ICD-10-CM | POA: Diagnosis not present

## 2018-06-02 DIAGNOSIS — D649 Anemia, unspecified: Secondary | ICD-10-CM

## 2018-06-02 LAB — CBC WITH DIFFERENTIAL/PLATELET
Abs Immature Granulocytes: 0.01 10*3/uL (ref 0.00–0.07)
Basophils Absolute: 0 10*3/uL (ref 0.0–0.1)
Basophils Relative: 1 %
EOS ABS: 0.1 10*3/uL (ref 0.0–0.5)
Eosinophils Relative: 1 %
HCT: 40.1 % (ref 36.0–46.0)
Hemoglobin: 12.6 g/dL (ref 12.0–15.0)
Immature Granulocytes: 0 %
Lymphocytes Relative: 24 %
Lymphs Abs: 1.4 10*3/uL (ref 0.7–4.0)
MCH: 28.6 pg (ref 26.0–34.0)
MCHC: 31.4 g/dL (ref 30.0–36.0)
MCV: 91.1 fL (ref 80.0–100.0)
Monocytes Absolute: 0.4 10*3/uL (ref 0.1–1.0)
Monocytes Relative: 7 %
Neutro Abs: 3.8 10*3/uL (ref 1.7–7.7)
Neutrophils Relative %: 67 %
Platelets: 202 10*3/uL (ref 150–400)
RBC: 4.4 MIL/uL (ref 3.87–5.11)
RDW: 14.8 % (ref 11.5–15.5)
WBC: 5.6 10*3/uL (ref 4.0–10.5)
nRBC: 0 % (ref 0.0–0.2)

## 2018-06-02 LAB — BASIC METABOLIC PANEL
Anion gap: 10 (ref 5–15)
BUN: 17 mg/dL (ref 8–23)
CO2: 27 mmol/L (ref 22–32)
Calcium: 8.4 mg/dL — ABNORMAL LOW (ref 8.9–10.3)
Chloride: 98 mmol/L (ref 98–111)
Creatinine, Ser: 1.21 mg/dL — ABNORMAL HIGH (ref 0.44–1.00)
GFR calc Af Amer: 56 mL/min — ABNORMAL LOW (ref >60)
GFR calc non Af Amer: 48 mL/min — ABNORMAL LOW (ref >60)
Glucose, Bld: 318 mg/dL — ABNORMAL HIGH (ref 70–99)
Potassium: 3.9 mmol/L (ref 3.5–5.1)
Sodium: 135 mmol/L (ref 135–145)

## 2018-06-02 NOTE — Progress Notes (Signed)
[place]

## 2018-06-04 ENCOUNTER — Inpatient Hospital Stay (HOSPITAL_BASED_OUTPATIENT_CLINIC_OR_DEPARTMENT_OTHER): Payer: Medicare Other | Admitting: Oncology

## 2018-06-04 ENCOUNTER — Other Ambulatory Visit: Payer: Self-pay

## 2018-06-04 VITALS — BP 170/84 | HR 90 | Temp 97.0°F | Ht 65.0 in | Wt 215.7 lb

## 2018-06-04 DIAGNOSIS — F17210 Nicotine dependence, cigarettes, uncomplicated: Secondary | ICD-10-CM

## 2018-06-04 DIAGNOSIS — Z794 Long term (current) use of insulin: Secondary | ICD-10-CM

## 2018-06-04 DIAGNOSIS — D509 Iron deficiency anemia, unspecified: Secondary | ICD-10-CM | POA: Diagnosis not present

## 2018-06-04 DIAGNOSIS — N189 Chronic kidney disease, unspecified: Secondary | ICD-10-CM

## 2018-06-04 DIAGNOSIS — E1165 Type 2 diabetes mellitus with hyperglycemia: Secondary | ICD-10-CM | POA: Diagnosis not present

## 2018-06-04 DIAGNOSIS — Z79899 Other long term (current) drug therapy: Secondary | ICD-10-CM

## 2018-06-04 NOTE — Progress Notes (Signed)
Patient is here today to follow up for her iron deficiency, anemia. Patient stated that she is doing well. Patient stated that her blood pressure is elevated because she eats a country ham biscuit everyday.

## 2018-06-24 DIAGNOSIS — R200 Anesthesia of skin: Secondary | ICD-10-CM | POA: Insufficient documentation

## 2018-06-26 ENCOUNTER — Other Ambulatory Visit: Payer: Self-pay | Admitting: Oncology

## 2018-07-10 ENCOUNTER — Telehealth: Payer: Self-pay | Admitting: *Deleted

## 2018-07-10 NOTE — Telephone Encounter (Signed)
Attempted to contact patient r/t LDCT Screening follow up due at this time.  No answer received, message left for patient to call [phone] to schedule appointment.

## 2018-07-12 ENCOUNTER — Encounter: Payer: Self-pay | Admitting: *Deleted

## 2018-07-12 ENCOUNTER — Telehealth: Payer: Self-pay | Admitting: *Deleted

## 2018-07-12 DIAGNOSIS — Z122 Encounter for screening for malignant neoplasm of respiratory organs: Secondary | ICD-10-CM

## 2018-07-12 NOTE — Telephone Encounter (Signed)
Patient has been notified that the annual lung cancer screening low dose CT scan is due currently or will be in the near future.  Confirmed that the patient is within the age range of 51-80, and asymptomatic, and currently exhibits no signs or symptoms of lung cancer.  Patient denies illness that would prevent curative treatment for lung cancer if found.  Verified smoking history, CURRENT SMOKER 1PPD WITH 48PKYR HX .  The shared decision making visit was completed on 07-15-17.  Patient is agreeable for the CT scan to be scheduled.  Will call patient back with date and time of appointment.

## 2018-07-13 ENCOUNTER — Telehealth: Payer: Self-pay | Admitting: *Deleted

## 2018-07-13 NOTE — Telephone Encounter (Signed)
Called pt to inform her of her appt for ldct screening on Monday 07/26/2018 here @ [place] @ 9:15am, voiced understanding.

## 2018-07-21 ENCOUNTER — Encounter: Payer: Self-pay | Admitting: *Deleted

## 2018-07-26 ENCOUNTER — Other Ambulatory Visit: Payer: Self-pay

## 2018-07-26 ENCOUNTER — Ambulatory Visit
Admission: RE | Admit: 2018-07-26 | Discharge: 2018-07-26 | Disposition: A | Discharge status: Home / Self Care | Payer: Medicare Other | Source: Ambulatory Visit | Attending: Oncology | Admitting: Oncology

## 2018-07-26 DIAGNOSIS — R918 Other nonspecific abnormal finding of lung field: Secondary | ICD-10-CM | POA: Insufficient documentation

## 2018-07-26 DIAGNOSIS — I700 Atherosclerosis of aorta: Secondary | ICD-10-CM | POA: Insufficient documentation

## 2018-07-26 DIAGNOSIS — F17210 Nicotine dependence, cigarettes, uncomplicated: Secondary | ICD-10-CM | POA: Diagnosis not present

## 2018-07-26 DIAGNOSIS — Z122 Encounter for screening for malignant neoplasm of respiratory organs: Secondary | ICD-10-CM | POA: Insufficient documentation

## 2018-07-26 DIAGNOSIS — R160 Hepatomegaly, not elsewhere classified: Secondary | ICD-10-CM | POA: Insufficient documentation

## 2018-07-26 DIAGNOSIS — J439 Emphysema, unspecified: Secondary | ICD-10-CM | POA: Diagnosis not present

## 2018-07-27 ENCOUNTER — Encounter: Payer: Self-pay | Admitting: Gastroenterology

## 2018-07-27 ENCOUNTER — Other Ambulatory Visit: Payer: Self-pay

## 2018-07-27 ENCOUNTER — Ambulatory Visit (INDEPENDENT_AMBULATORY_CARE_PROVIDER_SITE_OTHER): Payer: Medicare Other | Admitting: Gastroenterology

## 2018-07-27 VITALS — BP 133/77 | HR 92 | Resp 17 | Ht 65.0 in | Wt 211.8 lb

## 2018-07-27 DIAGNOSIS — R5382 Chronic fatigue, unspecified: Secondary | ICD-10-CM

## 2018-07-27 DIAGNOSIS — D509 Iron deficiency anemia, unspecified: Secondary | ICD-10-CM | POA: Diagnosis not present

## 2018-07-27 NOTE — Progress Notes (Signed)
Cephas Darby, MD [address]  [place], [place] [address]  Main: [phone]  Fax: [phone]    Gastroenterology Consultation  Referring Provider:     Kirk Ruths, MD Primary Care Physician:  Kirk Ruths, MD Primary Gastroenterologist:  Dr. Cephas Darby Reason for Consultation:  iron deficiency anemia, nonulcer dyspepsia        HPI:   Deanna Gay is a 63 y.o. African-American female referred by Dr. Ouida Sills, Ocie Cornfield, MD  for consultation & management of chronic iron deficiency anemia. She has history of diabetes, chronic tobacco use, COPD who was  admitted to [facility] on 10/29/2017 to 11/19/2017 secondary to UTI sepsis from Escherichia coli bacteremia that resulted in acute renal failure. She required temporary hemodialysis during that admission. She also developed acute on chronic normocytic anemia. Her last normal hemoglobin was 12.9 in 05/2017, lowest hemoglobin 7.4 during that admission. Patient has been followed by Dr. Cranford Mon at [facility] for iron deficiency anemia patient received blood transfusion on 12/14/17 and her hemoglobin responded appropriately. Patient has history of chronic iron deficiency anemia, her ferritin was 2 in 02/2015 and underwent EGD and colonoscopy by Dr Rayann Heman and no source of bleeding was identified. Patient reports taking Aleve one to 2 tablets a few times a week for chronic muscular skeletal pain. Most recent ferritin is 31 and folate is low at 5, B12 298. Patient has been receiving IV iron as needed at [facility]. Patient reports intermittent black stools. She denies rectal bleeding She denies chest pain, abdominal pain, shortness of breath, nausea or vomiting  Follow up visit 01/26/2018: She reports that she has stopped taking Aleve. She is not experiencing melena. She underwent EGD with gastric biopsies, unremarkable. Colonoscopy revealed multiple subcentimeter  tubular adenomas without evidence of high-grade dysplasia. I started her on folate due to folate deficiency. She continues to be iron deficient. She continues to smoke cigarettes.   Follow-up visit 04/2018 She stopped Prilosec and felt that her upper abdominal pain associated with bloating and burping has gotten worse.  Her anemia is currently resolved.  She is taking oral iron twice daily.  She has been taking folate daily.  She thinks folic acid is causing her GI symptoms.  She denies any other symptoms.  She continues to gain weight.  She tells me that she plays videogames on her phone until late night and sleeps during the day.  Follow-up visit 07/27/2018 Her anemia has resolved.  Iron levels completely restored.  She is still taking iron 150 mg twice daily.  She is treated for bronchitis with azithromycin and noticed burning on her tongue, inner side of her lips as well as breakdown in corners of her mouth.  She is taking omeprazole once a day.  She reports fatigue.  She is on several supplements including calcium, B complex, folic acid, iron  NSAIDs: Aleve 1-2 tablets daily in the past  Antiplts/Anticoagulants/Anti thrombotics: none  GI Procedures:  EGD 01/21/2018 - Normal duodenal bulb and second portion of the duodenum. - A single papule (nodule) found in the stomach. Biopsied. - Non-bleeding erosive gastropathy. - Normal gastric fundus, incisura, antrum and prepyloric region of the stomach. Biopsied. - Normal gastroesophageal junction and esophagus. - Small hiatal hernia.  Colonoscopy 01/21/2018 - Five 5 to 7 mm polyps in the transverse colon, in the ascending colon and in the cecum, removed with a hot snare. Resected and retrieved. - One diminutive polyp in  the ascending colon, removed with a cold biopsy forceps. Resected and retrieved. - Diverticulosis in the sigmoid colon. - The distal rectum and anal verge are normal on retroflexion view.  DIAGNOSIS:  A. STOMACH, RANDOM; COLD  BIOPSY:  - ANTRAL MUCOSA WITH MILD CHRONIC AND FOCAL REACTIVE GASTRITIS.  - OXYNTIC MUCOSA WITH MILD CHRONIC GASTRITIS AND CHANGES CONSISTENT WITH  PROTON PUMP INHIBITOR USE.  - NEGATIVE FOR H. PYLORI, DYSPLASIA, AND MALIGNANCY.   B. STOMACH NODULE, GREATER CURVATURE; COLD BIOPSY:  - OXYNTIC MUCOSA WITH CHANGES SUGGESTIVE OF EARLY FUNDIC GLAND POLYP.  - NEGATIVE FOR H. PYLORI, DYSPLASIA, AND MALIGNANCY.   C. COLON POLYP, CECUM; HOT SNARE:  - TUBULAR ADENOMA.  - NEGATIVE FOR HIGH-GRADE DYSPLASIA AND MALIGNANCY.   D. COLON POLYP X2, ASCENDING; COLD BIOPSY AND HOT SNARE:  - TUBULAR ADENOMA (MULTIPLE).  - NEGATIVE FOR HIGH-GRADE DYSPLASIA AND MALIGNANCY.   E. COLON POLYP X3, TRANSVERSE; HOT SNARE:  - TUBULAR ADENOMA (MULTIPLE).  - NEGATIVE FOR HIGH-GRADE DYSPLASIA AND MALIGNANCY.   EGD in 2016 - White nummular lesions in esophageal mucosa. Biopsied. - Normal stomach. - Normal examined duodenum.  Colonoscopy 12/25/2014 - Diverticulosis in the sigmoid colon. - The examination was otherwise normal on direct and retroflexion views. - No specimens collected.  Past Medical History:  Diagnosis Date  . Anemia   . Arthritis   . Cancer ([place])    cervical  . COPD (chronic obstructive pulmonary disease) ([place])   . COPD exacerbation ([place]) 07/19/2017  . Diabetes mellitus without complication ([place])   . GERD (gastroesophageal reflux disease)   . Hypertension   . Schizophrenia ([place])   . Sleep apnea     Past Surgical History:  Procedure Laterality Date  . APPENDECTOMY    . COLONOSCOPY WITH PROPOFOL N/A 12/25/2014   Procedure: COLONOSCOPY WITH PROPOFOL;  Surgeon: Josefine Class, MD;  Location: [facility] ENDOSCOPY;  Service: Endoscopy;  Laterality: N/A;  . COLONOSCOPY WITH PROPOFOL N/A 01/21/2018   Procedure: COLONOSCOPY WITH PROPOFOL;  Surgeon: Lin Landsman, MD;  Location: [facility] ENDOSCOPY;  Service: Gastroenterology;  Laterality: N/A;  . ESOPHAGOGASTRODUODENOSCOPY (EGD) WITH PROPOFOL  N/A 12/25/2014   Procedure: ESOPHAGOGASTRODUODENOSCOPY (EGD) WITH PROPOFOL;  Surgeon: Josefine Class, MD;  Location: [facility] ENDOSCOPY;  Service: Endoscopy;  Laterality: N/A;  . ESOPHAGOGASTRODUODENOSCOPY (EGD) WITH PROPOFOL N/A 01/21/2018   Procedure: ESOPHAGOGASTRODUODENOSCOPY (EGD) WITH PROPOFOL;  Surgeon: Lin Landsman, MD;  Location: [facility] ENDOSCOPY;  Service: Gastroenterology;  Laterality: N/A;  . GIVENS CAPSULE STUDY N/A 03/02/2018   Procedure: GIVENS CAPSULE STUDY;  Surgeon: Lin Landsman, MD;  Location: [facility] ENDOSCOPY;  Service: Gastroenterology;  Laterality: N/A;  . JOINT REPLACEMENT     right hip  . TOTAL HIP ARTHROPLASTY Left 05/16/2016   Procedure: LEFT TOTAL HIP ARTHROPLASTY ANTERIOR APPROACH;  Surgeon: Mcarthur Rossetti, MD;  Location: [facility] ORS;  Service: Orthopedics;  Laterality: Left;    Current Outpatient Medications:  .  ACCU-CHEK AVIVA PLUS test strip, use 1 TEST STRIP to TEST BLOOD SUGAR four times a day, Disp: , Rfl: 0 .  albuterol (PROVENTIL HFA;VENTOLIN HFA) 108 (90 BASE) MCG/ACT inhaler, Inhale 2 puffs into the lungs every 4 (four) hours as needed for wheezing or shortness of breath. , Disp: , Rfl:  .  Blood Glucose Monitoring Suppl (ACCU-CHEK AVIVA PLUS) w/Device KIT, See admin instructions., Disp: , Rfl: 0 .  budesonide-formoterol (SYMBICORT) 160-4.5 MCG/ACT inhaler, Inhale 2 puffs into the lungs 2 (two) times daily., Disp: , Rfl:  .  diclofenac sodium (VOLTAREN) 1 %  GEL, APP 2 GRAMS EXT AA QID, Disp: , Rfl: 11 .  diltiazem (CARDIZEM CD) 180 MG 24 hr capsule, Take 180 mg by mouth at bedtime. , Disp: , Rfl:  .  FERREX 150 150 MG capsule, TAKE 1 CAPSULE BY MOUTH TWICE A DAY, Disp: 200 capsule, Rfl: 0 .  folic acid (FOLVITE) 1 MG tablet, TAKE 1 TABLET(1 MG) BY MOUTH DAILY, Disp: 90 tablet, Rfl: 0 .  hydrochlorothiazide (HYDRODIURIL) 25 MG tablet, take 1 tablet by mouth every morning, Disp: , Rfl:  .  insulin glargine (LANTUS) 100 UNIT/ML injection, Inject 0.1  mLs (10 Units total) into the skin at bedtime., Disp: 10 mL, Rfl: 11 .  insulin lispro (HUMALOG) 100 UNIT/ML injection, Please check your sugars before each meal and use lispro per the below sliding scale:  For sugars 151-200: take 2 units For 201-250: take 4units For 251-300: take 6 units For 301-350: take 8 units For 351-400: take 10 units For > 401: call MD, Disp: 10 mL, Rfl: 11 .  Insulin Syringe-Needle U-100 (BD INSULIN SYRINGE ULTRAFINE) 31G X 5/16" 0.5 ML MISC, use as directed once daily, Disp: , Rfl:  .  ipratropium-albuterol (DUONEB) 0.5-2.5 (3) MG/3ML SOLN, Take 3 mLs by nebulization every 6 (six) hours as needed., Disp: , Rfl:  .  nicotine (NICODERM CQ - DOSED IN MG/24 HOURS) 21 mg/24hr patch, Place 1 patch onto the skin daily., Disp: , Rfl: 0 .  nitroGLYCERIN (NITROSTAT) 0.4 MG SL tablet, Place 1 tablet under the tongue every 5 (five) minutes as needed., Disp: , Rfl: 0 .  omeprazole (PRILOSEC) 20 MG capsule, Take 20 mg by mouth 2 (two) times daily., Disp: , Rfl:  .  oxyCODONE-acetaminophen (PERCOCET) 7.5-325 MG tablet, Take 1-2 tablets by mouth every 6 (six) hours as needed for severe pain., Disp: 30 tablet, Rfl: 0 .  predniSONE (DELTASONE) 10 MG tablet, Label  & dispense according to the schedule below. 5 Pills PO for 1 day then, 4 Pills PO for 1 day, 3 Pills PO for 1 day, 2 Pills PO for 1 day, 1 Pill PO for 1 days then STOP., Disp: 15 tablet, Rfl: 0 .  promethazine (PHENERGAN) 25 MG tablet, Take 25 mg by mouth every 4 (four) hours as needed. , Disp: , Rfl: 0 .  triamcinolone (KENALOG) 0.1 % paste, APPLY TO AREA OF IRRITATION TID PRN FOR PAIN AND SWELLING, Disp: , Rfl:  .  triamcinolone cream (KENALOG) 0.1 %, Apply 1 application topically 2 (two) times daily., Disp: , Rfl:  .  azithromycin (ZITHROMAX) 250 MG tablet, TK 2 TS PO ON DAY 1 THEN TK 1 T PO DAILY ON DAYS 2 THRU 5, Disp: , Rfl:    Family History  Problem Relation Age of Onset  . Hypertension Mother   . Diabetes Mellitus II  Mother   . Breast cancer Neg Hx   . Kidney cancer Neg Hx   . Bladder Cancer Neg Hx      Social History   Tobacco Use  . Smoking status: Current Every Day Smoker    Packs/day: 1.00    Years: 48.00    Pack years: 48.00    Types: Cigarettes  . Smokeless tobacco: Never Used  Substance Use Topics  . Alcohol use: No  . Drug use: No    Allergies as of 07/27/2018 - Review Complete 07/27/2018  Allergen Reaction Noted  . Aspirin Other (See Comments) 11/26/2016  . Iodine Swelling 12/22/2014  . Penicillins Swelling 12/25/2014  Review of Systems:    All systems reviewed and negative except where noted in HPI.   Physical Exam:  BP 133/77 (BP Location: Left Arm, Patient Position: Sitting, Cuff Size: Large)   Pulse 92   Resp 17   Ht 5' 5"  (1.651 m)   Wt 211 lb 12.8 oz (96.1 kg)   LMP  (LMP Unknown) Comment: postmenopausal  BMI 35.25 kg/m  No LMP recorded (lmp unknown). Patient is postmenopausal.  General:   Alert,  Well-developed, well-nourished, pleasant and cooperative in NAD Head:  Normocephalic and atraumatic. Eyes:  Sclera clear, no icterus.   Conjunctiva pink. Ears:  Normal auditory acuity. Nose:  No deformity, discharge, or lesions. Mouth:  No deformity or lesions,oropharynx pink & moist. Neck:  Supple; no masses or thyromegaly. Lungs:  Respirations even and unlabored.  Clear throughout to auscultation.   No wheezes, crackles, or rhonchi. No acute distress. Heart:  Regular rate and rhythm; no murmurs, clicks, rubs, or gallops. Abdomen:  Normal bowel sounds. Soft, non-tender and non-distended without masses, hepatosplenomegaly or hernias noted.  No guarding or rebound tenderness.   Rectal: Not performed Msk:  Symmetrical without gross deformities. Good, equal movement & strength bilaterally. Pulses:  Normal pulses noted. Extremities:  No clubbing or edema.  No cyanosis. Neurologic:  Alert and oriented x3;  grossly normal neurologically. Skin:  Intact without  significant lesions or rashes. No jaundice. Lymph Nodes:  No significant cervical adenopathy. Psych:  Alert and cooperative. Normal mood and affect.  Imaging Studies: Reviewed  Assessment and Plan:   Deanna Gay is a 63 y.o. African-American female with history of diabetes, metabolic syndrome, chronic iron deficiency anemia of unclear etiology, Escherichia coli bacteremia resulting in acute renal failure, temporary hemodialysis seen for follow-up of chronic iron deficiency anemia. Patient underwent EGD and colonoscopy in 2016 and no bleeding source was identified. She does report history of chronic intermittent NSAID use and intermittent black stools. Currently, she stopped NSAID use. She underwent repeat upper endoscopy and colonoscopy which did not reveal source of iron deficiency anemia. She does have folate deficiency  Iron deficiency anemia: Resolved EGD and colonoscopy with no source of IDA video capsule endoscopy was normal Normal ferritin levels and normal hemoglobin Advised patient to stop oral iron at this time and she agreed  She can stop continue oral folate as well Recheck ferritin levels today No further work-up from GI standpoint Periodically check her CBC and iron studies every 3 to 6 months  Dyspepsia Omeprazole 20 mg daily Trial of FD guard  History of tubular adenomas of colon: Recommend repeat colonoscopy in 01/2021  Gingivitis and cheilosis Check zinc levels  Hypoalbuminemia Recheck LFTs  Chronic fatigue and bone pains Check vitamin D levels   Follow up as needed   Cephas Darby, MD

## 2018-07-28 ENCOUNTER — Encounter: Payer: Self-pay | Admitting: *Deleted

## 2018-07-28 ENCOUNTER — Telehealth: Payer: Self-pay | Admitting: *Deleted

## 2018-07-28 NOTE — Telephone Encounter (Signed)
Notified patient of LDCT lung cancer screening program results with recommendation for 3 month follow up imaging. Also notified of incidental findings noted below and is encouraged to discuss further with PCP who will receive a copy of this note and/or the CT report. Patient verbalizes understanding.   Patient's PCP office contacted by phone and notified Gregary Signs of results and recommended clinical evaluation/ treatment.   IMPRESSION: 1. New clustered peribronchovascular nodularity in the medial left lower lobe. Findings are most likely due to an infectious bronchiolitis. However, by size criteria, largest lesion is categorized as Lung-RADS 4A, suspicious. Follow up low-dose chest CT without contrast in 3 months (please use the following order, "CT CHEST LCS NODULE FOLLOW-UP W/O CM") is recommended. Alternatively, PET may be considered when there is a solid component 56m or larger. These results will be called to the ordering clinician or representative by the Radiologist Assistant, and communication documented in the PACS or zVision Dashboard. 2. Many of the previously measured nodules have decreased in size or resolved in the interval. 3. Liver margin is irregular and there is hypertrophy of the left hepatic lobe, findings indicative of cirrhosis. 4.  Aortic atherosclerosis (ICD10-170.0). 5. Enlarged pulmonic trunk, indicative of pulmonary arterial hypertension. 6.  Emphysema (ICD10-J43.9).

## 2018-07-29 LAB — VITAMIN D 25 HYDROXY (VIT D DEFICIENCY, FRACTURES): Vit D, 25-Hydroxy: 14.1 ng/mL — ABNORMAL LOW (ref 30.0–100.0)

## 2018-07-30 LAB — HEPATIC FUNCTION PANEL
ALT: 8 IU/L (ref 0–32)
AST: 7 IU/L (ref 0–40)
Albumin: 3.7 g/dL — ABNORMAL LOW (ref 3.8–4.8)
Alkaline Phosphatase: 137 IU/L — ABNORMAL HIGH (ref 39–117)
Bilirubin Total: <0.2 mg/dL (ref 0.0–1.2)
Bilirubin, Direct: 0.07 mg/dL (ref 0.00–0.40)
Total Protein: 7.2 g/dL (ref 6.0–8.5)

## 2018-07-30 LAB — ZINC: Zinc: 86 ug/dL (ref 56–134)

## 2018-07-30 LAB — FERRITIN: Ferritin: 68 ng/mL (ref 15–150)

## 2018-08-04 ENCOUNTER — Other Ambulatory Visit: Payer: Self-pay

## 2018-08-04 MED ORDER — VITAMIN D (ERGOCALCIFEROL) 1.25 MG (50000 UNIT) PO CAPS: 50000.0000 [IU] | ORAL_CAPSULE | ORAL | 0 refills | Status: DC

## 2018-09-02 ENCOUNTER — Other Ambulatory Visit: Payer: Self-pay | Admitting: Oncology

## 2018-10-01 ENCOUNTER — Other Ambulatory Visit: Payer: Self-pay

## 2018-10-01 ENCOUNTER — Other Ambulatory Visit: Payer: Self-pay | Admitting: Internal Medicine

## 2018-10-01 ENCOUNTER — Ambulatory Visit
Admission: RE | Admit: 2018-10-01 | Discharge: 2018-10-01 | Disposition: A | Discharge status: Home / Self Care | Payer: Medicare Other | Source: Ambulatory Visit | Attending: Internal Medicine | Admitting: Internal Medicine

## 2018-10-01 ENCOUNTER — Ambulatory Visit: Payer: Medicare Other

## 2018-10-01 ENCOUNTER — Other Ambulatory Visit: Payer: Medicare Other

## 2018-10-01 ENCOUNTER — Ambulatory Visit: Payer: Medicare Other | Admitting: Oncology

## 2018-10-01 DIAGNOSIS — R1031 Right lower quadrant pain: Secondary | ICD-10-CM | POA: Diagnosis present

## 2018-10-12 ENCOUNTER — Other Ambulatory Visit: Payer: Self-pay | Admitting: Specialist

## 2018-10-12 DIAGNOSIS — R0609 Other forms of dyspnea: Secondary | ICD-10-CM

## 2018-10-12 DIAGNOSIS — R918 Other nonspecific abnormal finding of lung field: Secondary | ICD-10-CM

## 2018-10-24 ENCOUNTER — Other Ambulatory Visit: Payer: Self-pay | Admitting: Gastroenterology

## 2018-10-27 ENCOUNTER — Other Ambulatory Visit: Payer: Medicare Other

## 2018-10-28 ENCOUNTER — Inpatient Hospital Stay: Payer: Medicare Other

## 2018-10-28 ENCOUNTER — Inpatient Hospital Stay: Payer: Medicare Other | Attending: Oncology | Admitting: Oncology

## 2018-10-28 ENCOUNTER — Other Ambulatory Visit: Payer: Self-pay

## 2018-10-28 DIAGNOSIS — I129 Hypertensive chronic kidney disease with stage 1 through stage 4 chronic kidney disease, or unspecified chronic kidney disease: Secondary | ICD-10-CM | POA: Diagnosis not present

## 2018-10-28 DIAGNOSIS — J449 Chronic obstructive pulmonary disease, unspecified: Secondary | ICD-10-CM | POA: Diagnosis not present

## 2018-10-28 DIAGNOSIS — E1165 Type 2 diabetes mellitus with hyperglycemia: Secondary | ICD-10-CM | POA: Diagnosis not present

## 2018-10-28 DIAGNOSIS — Z79899 Other long term (current) drug therapy: Secondary | ICD-10-CM | POA: Insufficient documentation

## 2018-10-28 DIAGNOSIS — N189 Chronic kidney disease, unspecified: Secondary | ICD-10-CM | POA: Diagnosis not present

## 2018-10-28 DIAGNOSIS — Z794 Long term (current) use of insulin: Secondary | ICD-10-CM | POA: Insufficient documentation

## 2018-10-28 DIAGNOSIS — D509 Iron deficiency anemia, unspecified: Secondary | ICD-10-CM | POA: Insufficient documentation

## 2018-10-28 DIAGNOSIS — F17210 Nicotine dependence, cigarettes, uncomplicated: Secondary | ICD-10-CM | POA: Diagnosis not present

## 2018-10-28 LAB — BASIC METABOLIC PANEL
Anion gap: 9 (ref 5–15)
BUN: 19 mg/dL (ref 8–23)
CO2: 29 mmol/L (ref 22–32)
Calcium: 8.1 mg/dL — ABNORMAL LOW (ref 8.9–10.3)
Chloride: 98 mmol/L (ref 98–111)
Creatinine, Ser: 1.18 mg/dL — ABNORMAL HIGH (ref 0.44–1.00)
GFR calc Af Amer: 57 mL/min — ABNORMAL LOW (ref >60)
GFR calc non Af Amer: 49 mL/min — ABNORMAL LOW (ref >60)
Glucose, Bld: 131 mg/dL — ABNORMAL HIGH (ref 70–99)
Potassium: 3.8 mmol/L (ref 3.5–5.1)
Sodium: 136 mmol/L (ref 135–145)

## 2018-10-28 LAB — CBC WITH DIFFERENTIAL/PLATELET
Abs Immature Granulocytes: 0.03 10*3/uL (ref 0.00–0.07)
Basophils Absolute: 0.1 10*3/uL (ref 0.0–0.1)
Basophils Relative: 1 %
Eosinophils Absolute: 0.1 10*3/uL (ref 0.0–0.5)
Eosinophils Relative: 1 %
HCT: 39.6 % (ref 36.0–46.0)
Hemoglobin: 12.5 g/dL (ref 12.0–15.0)
Immature Granulocytes: 1 %
Lymphocytes Relative: 29 %
Lymphs Abs: 1.8 10*3/uL (ref 0.7–4.0)
MCH: 28 pg (ref 26.0–34.0)
MCHC: 31.6 g/dL (ref 30.0–36.0)
MCV: 88.6 fL (ref 80.0–100.0)
Monocytes Absolute: 0.5 10*3/uL (ref 0.1–1.0)
Monocytes Relative: 8 %
Neutro Abs: 3.9 10*3/uL (ref 1.7–7.7)
Neutrophils Relative %: 60 %
Platelets: 223 10*3/uL (ref 150–400)
RBC: 4.47 MIL/uL (ref 3.87–5.11)
RDW: 14.5 % (ref 11.5–15.5)
WBC: 6.4 10*3/uL (ref 4.0–10.5)
nRBC: 0 % (ref 0.0–0.2)

## 2018-10-28 NOTE — Progress Notes (Signed)
Patient stated that she stays tiered, fatigued and nauseated. Patient had not noticed any blood loss.

## 2018-10-29 NOTE — Progress Notes (Signed)
[place]  Telephone:([phone]) [phone] Fax:(336) [phone]  ID: CHAUNTAY [id number] OB: 27-Mar-1956  MR#: [id number]  KCM#:[id number]  Patient Care Team: Kirk Ruths, MD as PCP - General (Internal Medicine)  I connected with [name] on 10/29/18 at 10:45 AM EDT by telephone visit and verified that I am speaking with the correct person using two identifiers.   I discussed the limitations, risks, security and privacy concerns of performing an evaluation and management service by telemedicine and the availability of in-person appointments. I also discussed with the patient that there may be a patient responsible charge related to this service. The patient expressed understanding and agreed to proceed.   Other persons participating in the visit and their role in the encounter: Patient, MD  Patient's location: Home Provider's location: Clinic  CHIEF COMPLAINT: Iron deficiency anemia.  INTERVAL HISTORY: Patient agreed to telephone visit for evaluation and discussion of her laboratory work.  She continues to have chronic weakness and fatigue.  She also has persistent shortness of breath that is unchanged. She has no neurologic complaints.  She denies any recent fevers or illnesses. She has no chest pain, cough, or hemoptysis. She denies any nausea, vomiting, constipation, or diarrhea. She has no melena or hematochezia.  She has no urinary complaints.  Patient otherwise feels well and offers no further specific complaints today.  REVIEW OF SYSTEMS:   Review of Systems  Constitutional: Positive for malaise/fatigue. Negative for fever and weight loss.  Respiratory: Positive for shortness of breath. Negative for cough and hemoptysis.   Cardiovascular: Negative.  Negative for chest pain and leg swelling.  Gastrointestinal: Negative.  Negative for abdominal pain, blood in stool and melena.  Genitourinary: Negative.  Negative for frequency and hematuria.   Musculoskeletal: Negative.  Negative for myalgias.  Skin: Negative.  Negative for rash.  Neurological: Positive for weakness. Negative for dizziness, focal weakness and headaches.  Psychiatric/Behavioral: Negative.  The patient is not nervous/anxious.     As per HPI. Otherwise, a complete review of systems is negative.  PAST MEDICAL HISTORY: Past Medical History:  Diagnosis Date  . Anemia   . Arthritis   . Cancer ([place])    cervical  . COPD (chronic obstructive pulmonary disease) ([place])   . COPD exacerbation ([place]) 07/19/2017  . Diabetes mellitus without complication ([place])   . GERD (gastroesophageal reflux disease)   . Hypertension   . Schizophrenia ([place])   . Sleep apnea     PAST SURGICAL HISTORY: Past Surgical History:  Procedure Laterality Date  . APPENDECTOMY    . COLONOSCOPY WITH PROPOFOL N/A 12/25/2014   Procedure: COLONOSCOPY WITH PROPOFOL;  Surgeon: Josefine Class, MD;  Location: [facility] ENDOSCOPY;  Service: Endoscopy;  Laterality: N/A;  . COLONOSCOPY WITH PROPOFOL N/A 01/21/2018   Procedure: COLONOSCOPY WITH PROPOFOL;  Surgeon: Lin Landsman, MD;  Location: [facility] ENDOSCOPY;  Service: Gastroenterology;  Laterality: N/A;  . ESOPHAGOGASTRODUODENOSCOPY (EGD) WITH PROPOFOL N/A 12/25/2014   Procedure: ESOPHAGOGASTRODUODENOSCOPY (EGD) WITH PROPOFOL;  Surgeon: Josefine Class, MD;  Location: [facility] ENDOSCOPY;  Service: Endoscopy;  Laterality: N/A;  . ESOPHAGOGASTRODUODENOSCOPY (EGD) WITH PROPOFOL N/A 01/21/2018   Procedure: ESOPHAGOGASTRODUODENOSCOPY (EGD) WITH PROPOFOL;  Surgeon: Lin Landsman, MD;  Location: [facility] ENDOSCOPY;  Service: Gastroenterology;  Laterality: N/A;  . GIVENS CAPSULE STUDY N/A 03/02/2018   Procedure: GIVENS CAPSULE STUDY;  Surgeon: Lin Landsman, MD;  Location: [facility] ENDOSCOPY;  Service: Gastroenterology;  Laterality: N/A;  . JOINT REPLACEMENT     right hip  .  TOTAL HIP ARTHROPLASTY Left 05/16/2016   Procedure: LEFT TOTAL HIP ARTHROPLASTY  ANTERIOR APPROACH;  Surgeon: Mcarthur Rossetti, MD;  Location: [facility] ORS;  Service: Orthopedics;  Laterality: Left;    FAMILY HISTORY Family History  Problem Relation Age of Onset  . Hypertension Mother   . Diabetes Mellitus II Mother   . Breast cancer Neg Hx   . Kidney cancer Neg Hx   . Bladder Cancer Neg Hx        ADVANCED DIRECTIVES:    HEALTH MAINTENANCE: Social History   Tobacco Use  . Smoking status: Current Every Day Smoker    Packs/day: 1.00    Years: 48.00    Pack years: 48.00    Types: Cigarettes  . Smokeless tobacco: Never Used  Substance Use Topics  . Alcohol use: No  . Drug use: No     Colonoscopy:  PAP:  Bone density:  Lipid panel:  Allergies  Allergen Reactions  . Aspirin Other (See Comments)    Ongoing anemia  . Iodine Swelling  . Penicillins Swelling    Has patient had a PCN reaction causing immediate rash, facial/tongue/throat swelling, SOB or lightheadedness with hypotension: Yes Has patient had a PCN reaction causing severe rash involving mucus membranes or skin necrosis: Yes Has patient had a PCN reaction that required hospitalization: No Has patient had a PCN reaction occurring within the last 10 years: No If all of the above answers are "NO", then may proceed with Cephalosporin use.     Current Outpatient Medications  Medication Sig Dispense Refill  . ACCU-CHEK AVIVA PLUS test strip use 1 TEST STRIP to TEST BLOOD SUGAR four times a day  0  . albuterol (PROVENTIL HFA;VENTOLIN HFA) 108 (90 BASE) MCG/ACT inhaler Inhale 2 puffs into the lungs every 4 (four) hours as needed for wheezing or shortness of breath.     . Blood Glucose Monitoring Suppl (ACCU-CHEK AVIVA PLUS) w/Device KIT See admin instructions.  0  . budesonide-formoterol (SYMBICORT) 160-4.5 MCG/ACT inhaler Inhale 2 puffs into the lungs 2 (two) times daily.    . diclofenac sodium (VOLTAREN) 1 % GEL APP 2 GRAMS EXT AA QID  11  . diltiazem (CARDIZEM CD) 180 MG 24 hr capsule Take  180 mg by mouth at bedtime.     Marland Kitchen FERREX 150 150 MG capsule TAKE 1 CAPSULE BY MOUTH TWICE A DAY 287 capsule 0  . folic acid (FOLVITE) 1 MG tablet TAKE 1 TABLET(1 MG) BY MOUTH DAILY 90 tablet 0  . hydrochlorothiazide (HYDRODIURIL) 25 MG tablet take 1 tablet by mouth every morning    . insulin glargine (LANTUS) 100 UNIT/ML injection Inject 0.1 mLs (10 Units total) into the skin at bedtime. 10 mL 11  . insulin lispro (HUMALOG) 100 UNIT/ML injection Please check your sugars before each meal and use lispro per the below sliding scale:  For sugars 151-200: take 2 units For 201-250: take 4units For 251-300: take 6 units For 301-350: take 8 units For 351-400: take 10 units For > 401: call MD 10 mL 11  . Insulin Syringe-Needle U-100 (BD INSULIN SYRINGE ULTRAFINE) 31G X 5/16" 0.5 ML MISC use as directed once daily    . ipratropium-albuterol (DUONEB) 0.5-2.5 (3) MG/3ML SOLN Take 3 mLs by nebulization every 6 (six) hours as needed.    . nicotine (NICODERM CQ - DOSED IN MG/24 HOURS) 21 mg/24hr patch Place 1 patch onto the skin daily.  0  . nitroGLYCERIN (NITROSTAT) 0.4 MG SL tablet Place 1 tablet under  the tongue every 5 (five) minutes as needed.  0  . omeprazole (PRILOSEC) 20 MG capsule Take 20 mg by mouth 2 (two) times daily.    . promethazine (PHENERGAN) 25 MG tablet Take 25 mg by mouth every 4 (four) hours as needed.   0  . triamcinolone (KENALOG) 0.1 % paste APPLY TO AREA OF IRRITATION TID PRN FOR PAIN AND SWELLING    . triamcinolone cream (KENALOG) 0.1 % Apply 1 application topically 2 (two) times daily.    . Vitamin D, Ergocalciferol, (DRISDOL) 1.25 MG (50000 UT) CAPS capsule Take 1 capsule (50,000 Units total) by mouth once a week. For 12 weeks 12 capsule 0  . oxyCODONE-acetaminophen (PERCOCET) 7.5-325 MG tablet Take 1-2 tablets by mouth every 6 (six) hours as needed for severe pain. (Patient not taking: Reported on 10/28/2018) 30 tablet 0  . predniSONE (DELTASONE) 10 MG tablet Label  & dispense  according to the schedule below. 5 Pills PO for 1 day then, 4 Pills PO for 1 day, 3 Pills PO for 1 day, 2 Pills PO for 1 day, 1 Pill PO for 1 days then STOP. (Patient not taking: Reported on 10/28/2018) 15 tablet 0   No current facility-administered medications for this visit.     OBJECTIVE: There were no vitals filed for this visit.   There is no height or weight on file to calculate BMI.    ECOG FS:0 - Asymptomatic   LAB RESULTS:  Lab Results  Component Value Date   NA 136 10/28/2018   K 3.8 10/28/2018   CL 98 10/28/2018   CO2 29 10/28/2018   GLUCOSE 131 (H) 10/28/2018   BUN 19 10/28/2018   CREATININE 1.18 (H) 10/28/2018   CALCIUM 8.1 (L) 10/28/2018   PROT 7.2 07/28/2018   ALBUMIN 3.7 (L) 07/28/2018   AST 7 07/28/2018   ALT 8 07/28/2018   ALKPHOS 137 (H) 07/28/2018   BILITOT <0.2 07/28/2018   GFRNONAA 49 (L) 10/28/2018   GFRAA 57 (L) 10/28/2018    Lab Results  Component Value Date   WBC 6.4 10/28/2018   NEUTROABS 3.9 10/28/2018   HGB 12.5 10/28/2018   HCT 39.6 10/28/2018   MCV 88.6 10/28/2018   PLT 223 10/28/2018   Lab Results  Component Value Date   FERRITIN 68 07/28/2018   Lab Results  Component Value Date   IRON 49 05/11/2018   TIBC 341 05/11/2018   IRONPCTSAT 14 05/11/2018      STUDIES: Ct Abdomen Pelvis Wo Contrast  Result Date: 10/01/2018 CLINICAL DATA:  Abdominal pain for 2-3 weeks. EXAM: CT ABDOMEN AND PELVIS WITHOUT CONTRAST TECHNIQUE: Multidetector CT imaging of the abdomen and pelvis was performed following the standard protocol without IV contrast. COMPARISON:  11/28/2016 FINDINGS: Lower chest: There is a small perifissural nodule within the anterolateral right lower lobe measuring 4 mm. No acute abnormality. Hepatobiliary: There is hypertrophy of the lateral segment of left lobe of liver and caudate lobe of liver. The contour the liver is slightly irregular. Gallbladder normal. No biliary dilatation. Pancreas: Unremarkable. No pancreatic ductal  dilatation or surrounding inflammatory changes. Spleen: Normal in size without focal abnormality. Adrenals/Urinary Tract: Normal appearance of the adrenal glands. Bilateral renal cortical scarring and thinning. No nephrolithiasis or hydronephrosis identified. Urinary bladder appears normal. Stomach/Bowel: Stomach is normal. The small bowel loops have a normal course and caliber. No obstruction. Previous appendectomy. Distal colonic diverticulosis without acute inflammation. Vascular/Lymphatic: Aortic atherosclerosis. No aneurysm. No abdominopelvic adenopathy identified Reproductive: Uterus and bilateral adnexa  are unremarkable. Other: No free fluid or fluid collection. Musculoskeletal: Previous bilateral hip arthroplasty. No acute or suspicious bone abnormalities. Multi level spondylosis within the lower thoracic spine. IMPRESSION: 1. No acute findings within the abdomen or pelvis. 2. Bilateral renal cortical scarring in thinning. 3. 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 Electronically Signed   By: Kerby Moors M.D.   On: 10/01/2018 14:50    ASSESSMENT: Iron deficiency anemia.  PLAN:    1. Iron deficiency anemia: Patient's hemoglobin and iron stores continue to be within normal limits.  She does not require additional IV Feraheme today.  Her last treatment occurred on January 15, 2018.  Colonoscopy on January 21, 2018 revealed 6 polyps that were negative for malignancy.  Recommendation was to repeat in 3 years.  EGD did not reveal any evidence of bleeding.  Patient reports her capsule endoscopy was also normal, but we do not have these results.  No intervention is needed.  Return to clinic in 6 months with repeat laboratory work and further evaluation.   2. Hyperglycemia: Patient continues to have poor glycemic control.  Continue insulin and diabetic medications as prescribed. 3.  Chronic renal insufficiency: Patient's most recent creatinine was 1.18.  Which is approximately her  baseline. 4.  Shortness of breath: Likely related to underlying COPD.  Patient reports she recently had an appointment with pulmonology.   5.  Smoking cessation: Patient continues to smoke heavily and was offered counseling which she has declined.  I provided 15 minutes of non face-to-face telephone visit time during this encounter, and > 50% was spent counseling as documented under my assessment & plan.   Patient expressed understanding and was in agreement with this plan. She also understands that She can call clinic at any time with any questions, concerns, or complaints.    Lloyd Huger, MD   10/29/2018 8:01 AM

## 2018-11-01 ENCOUNTER — Telehealth: Payer: Self-pay | Admitting: *Deleted

## 2018-11-01 DIAGNOSIS — Z122 Encounter for screening for malignant neoplasm of respiratory organs: Secondary | ICD-10-CM

## 2018-11-01 DIAGNOSIS — Z87891 Personal history of nicotine dependence: Secondary | ICD-10-CM

## 2018-11-01 DIAGNOSIS — R918 Other nonspecific abnormal finding of lung field: Secondary | ICD-10-CM

## 2018-11-01 NOTE — Telephone Encounter (Signed)
Patient contacted and scheduled for LCS nodule f/u scan this Friday.

## 2018-11-05 ENCOUNTER — Ambulatory Visit: Payer: Medicare Other | Attending: Oncology

## 2018-11-12 ENCOUNTER — Ambulatory Visit: Admission: RE | Admit: 2018-11-12 | Payer: Medicare Other | Source: Ambulatory Visit

## 2018-11-13 ENCOUNTER — Other Ambulatory Visit: Payer: Self-pay | Admitting: Family Medicine

## 2018-11-13 DIAGNOSIS — Z20822 Contact with and (suspected) exposure to covid-19: Secondary | ICD-10-CM

## 2018-11-17 ENCOUNTER — Telehealth: Payer: Self-pay | Admitting: *Deleted

## 2018-11-17 NOTE — Telephone Encounter (Signed)
Left voicemail in attempt to reschedule LCS nodule follow up scan.

## 2018-11-19 LAB — NOVEL CORONAVIRUS, NAA: SARS-CoV-2, NAA: NOT DETECTED

## 2018-11-29 ENCOUNTER — Other Ambulatory Visit: Payer: Self-pay

## 2018-11-29 ENCOUNTER — Ambulatory Visit
Admission: RE | Admit: 2018-11-29 | Discharge: 2018-11-29 | Disposition: A | Discharge status: Home / Self Care | Payer: Medicare Other | Source: Ambulatory Visit | Attending: Oncology | Admitting: Oncology

## 2018-11-29 DIAGNOSIS — Z122 Encounter for screening for malignant neoplasm of respiratory organs: Secondary | ICD-10-CM | POA: Diagnosis not present

## 2018-11-29 DIAGNOSIS — R918 Other nonspecific abnormal finding of lung field: Secondary | ICD-10-CM | POA: Diagnosis present

## 2018-11-29 DIAGNOSIS — Z87891 Personal history of nicotine dependence: Secondary | ICD-10-CM | POA: Insufficient documentation

## 2018-11-30 ENCOUNTER — Telehealth: Payer: Self-pay | Admitting: *Deleted

## 2018-11-30 NOTE — Telephone Encounter (Signed)
Patient called with results noted below and reviewed plan for 3 month follow up after clinical evaluation by PCP. Patient verbalizes understanding and will be expecting a call from PCP office and will follow up. Results faxed to PCP office and called to make them aware of result and need for clinical evaluation.   IMPRESSION: Lung-RADS 0, incomplete. Additional lung cancer screening CT images/or comparison to prior chest CT examinations is needed.  New multifocal patchy/ground-glass opacities in the lungs bilaterally, most prominently in the right middle lobe and lingula. Additional scattered small pulmonary nodules measuring up to 3.9 mm, possibly infectious, although indeterminate. These findings favor mild infection, including atypical mycobacterial and viral etiologies.  Consider repeat low-dose lung cancer screening CT chest in 3 months after appropriate antimicrobial therapy, with testing for possible viral etiologies as clinically warranted.  These results will be called to the ordering clinician or representative by the Radiologist Assistant, and communication

## 2018-12-27 ENCOUNTER — Ambulatory Visit: Payer: Self-pay | Admitting: Oncology

## 2018-12-27 ENCOUNTER — Ambulatory Visit: Payer: Self-pay

## 2018-12-27 ENCOUNTER — Other Ambulatory Visit: Payer: Medicare Other

## 2019-02-19 ENCOUNTER — Emergency Department: Payer: Medicare Other

## 2019-02-19 ENCOUNTER — Encounter: Payer: Self-pay | Admitting: Emergency Medicine

## 2019-02-19 ENCOUNTER — Emergency Department
Admission: EM | Admit: 2019-02-19 | Discharge: 2019-02-19 | Disposition: A | Discharge status: Home / Self Care | Payer: Medicare Other | Attending: Emergency Medicine | Admitting: Emergency Medicine

## 2019-02-19 ENCOUNTER — Other Ambulatory Visit: Payer: Self-pay

## 2019-02-19 DIAGNOSIS — J189 Pneumonia, unspecified organism: Secondary | ICD-10-CM | POA: Insufficient documentation

## 2019-02-19 DIAGNOSIS — E119 Type 2 diabetes mellitus without complications: Secondary | ICD-10-CM | POA: Diagnosis not present

## 2019-02-19 DIAGNOSIS — Z79899 Other long term (current) drug therapy: Secondary | ICD-10-CM | POA: Diagnosis not present

## 2019-02-19 DIAGNOSIS — Z8541 Personal history of malignant neoplasm of cervix uteri: Secondary | ICD-10-CM | POA: Diagnosis not present

## 2019-02-19 DIAGNOSIS — R5383 Other fatigue: Secondary | ICD-10-CM | POA: Insufficient documentation

## 2019-02-19 DIAGNOSIS — R0600 Dyspnea, unspecified: Secondary | ICD-10-CM | POA: Insufficient documentation

## 2019-02-19 DIAGNOSIS — Z96642 Presence of left artificial hip joint: Secondary | ICD-10-CM | POA: Diagnosis not present

## 2019-02-19 DIAGNOSIS — Z20828 Contact with and (suspected) exposure to other viral communicable diseases: Secondary | ICD-10-CM | POA: Diagnosis not present

## 2019-02-19 DIAGNOSIS — F17210 Nicotine dependence, cigarettes, uncomplicated: Secondary | ICD-10-CM | POA: Diagnosis not present

## 2019-02-19 DIAGNOSIS — Z794 Long term (current) use of insulin: Secondary | ICD-10-CM | POA: Insufficient documentation

## 2019-02-19 DIAGNOSIS — I10 Essential (primary) hypertension: Secondary | ICD-10-CM | POA: Diagnosis not present

## 2019-02-19 DIAGNOSIS — M7918 Myalgia, other site: Secondary | ICD-10-CM | POA: Diagnosis not present

## 2019-02-19 DIAGNOSIS — R05 Cough: Secondary | ICD-10-CM | POA: Diagnosis present

## 2019-02-19 DIAGNOSIS — J449 Chronic obstructive pulmonary disease, unspecified: Secondary | ICD-10-CM | POA: Diagnosis not present

## 2019-02-19 MED ORDER — IPRATROPIUM-ALBUTEROL 0.5-2.5 (3) MG/3ML IN SOLN
3.0000 mL | Freq: Once | RESPIRATORY_TRACT | Status: AC
  Administered 2019-02-19: 3 mL via RESPIRATORY_TRACT
  Filled 2019-02-19: qty 3

## 2019-02-19 MED ORDER — AZITHROMYCIN 250 MG PO TABS: ORAL_TABLET | ORAL | 0 refills | Status: AC

## 2019-02-19 MED ORDER — PREDNISONE 20 MG PO TABS
60.0000 mg | ORAL_TABLET | Freq: Once | ORAL | Status: AC
  Administered 2019-02-19: 60 mg via ORAL
  Filled 2019-02-19: qty 3

## 2019-02-19 MED ORDER — BENZONATATE 100 MG PO CAPS
200.0000 mg | ORAL_CAPSULE | Freq: Once | ORAL | Status: AC
  Administered 2019-02-19: 200 mg via ORAL
  Filled 2019-02-19: qty 2

## 2019-02-19 MED ORDER — METHYLPREDNISOLONE 4 MG PO TBPK: ORAL_TABLET | ORAL | 0 refills | Status: DC

## 2019-02-19 MED ORDER — BENZONATATE 100 MG PO CAPS: 200.0000 mg | ORAL_CAPSULE | Freq: Three times a day (TID) | ORAL | 0 refills | Status: AC | PRN

## 2019-02-19 NOTE — ED Triage Notes (Signed)
States began cough yesterday. States does have some shortness of breath but that it is no worse than it usually is. States uses 02 at night.

## 2019-02-19 NOTE — Discharge Instructions (Signed)
Follow discharge care instruction.  Advised self quarantine pending results of COVID-19 test.  Take medication as directed.

## 2019-02-19 NOTE — ED Provider Notes (Signed)
[facility] Emergency Department Provider Note   ____________________________________________   First MD Initiated Contact with Patient 02/19/19 1257     (approximate)  I have reviewed the triage vital signs and the nursing notes.   HISTORY  Chief Complaint Cough    HPI Deanna Gay is a 63 y.o. female patient presents with cough which began yesterday.  Patient also states there is an increase in  her dyspnea.  Patient does use O2 at home.  Patient said cough is nonproductive.  Patient also complaining of mild fatigue and body aches.  Patient rates the pain as a 7/10.  Patient described pain is "achy".  No palliative measure except for home oxygen for complaint.  Patient has history of COPD.         Past Medical History:  Diagnosis Date  . Anemia   . Arthritis   . Cancer ([place])    cervical  . COPD (chronic obstructive pulmonary disease) ([place])   . COPD exacerbation ([place]) 07/19/2017  . Diabetes mellitus without complication ([place])   . GERD (gastroesophageal reflux disease)   . Hypertension   . Schizophrenia ([place])   . Sleep apnea     Patient Active Problem List   Diagnosis Date Noted  . COPD with acute exacerbation ([place]) 05/14/2018  . Folate or folic acid deficiency anemia 01/26/2018  . Severe sepsis ([place]) 11/11/2017  . Severe obesity (BMI 35.0-39.9) with comorbidity ([place]) 03/12/2017  . PMB (postmenopausal bleeding) 12/04/2016  . Bilateral carpal tunnel syndrome 10/14/2016  . Healthcare maintenance 08/18/2016  . Unilateral primary osteoarthritis, left hip 05/16/2016  . Status post left hip replacement 05/16/2016  . Iron deficiency anemia 02/27/2015  . Aortic atherosclerosis ([place]) 03/04/2014  . Sleep apnea 10/31/2013  . Hyperlipidemia 09/23/2013  . 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 Hypertension, essential 08/27/2013  .  COPD (chronic obstructive pulmonary disease) ([place]) 08/27/2013  . 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 APPENDECTOMY    . COLONOSCOPY WITH PROPOFOL N/A 12/25/2014   Procedure: COLONOSCOPY WITH PROPOFOL;  Surgeon: Josefine Class, MD;  Location: [facility] ENDOSCOPY;  Service: Endoscopy;  Laterality: N/A;  . COLONOSCOPY WITH PROPOFOL N/A 01/21/2018   Procedure: COLONOSCOPY WITH PROPOFOL;  Surgeon: Lin Landsman, MD;  Location: [facility] ENDOSCOPY;  Service: Gastroenterology;  Laterality: N/A;  . ESOPHAGOGASTRODUODENOSCOPY (EGD) WITH PROPOFOL N/A 12/25/2014   Procedure: ESOPHAGOGASTRODUODENOSCOPY (EGD) WITH PROPOFOL;  Surgeon: Josefine Class, MD;  Location: [facility] ENDOSCOPY;  Service: Endoscopy;  Laterality: N/A;  . ESOPHAGOGASTRODUODENOSCOPY (EGD) WITH PROPOFOL N/A 01/21/2018   Procedure: ESOPHAGOGASTRODUODENOSCOPY (EGD) WITH PROPOFOL;  Surgeon: Lin Landsman, MD;  Location: [facility] ENDOSCOPY;  Service: Gastroenterology;  Laterality: N/A;  . GIVENS CAPSULE STUDY N/A 03/02/2018   Procedure: GIVENS CAPSULE STUDY;  Surgeon: Lin Landsman, MD;  Location: [facility] ENDOSCOPY;  Service: Gastroenterology;  Laterality: N/A;  . JOINT REPLACEMENT     right hip  . TOTAL HIP ARTHROPLASTY Left 05/16/2016   Procedure: LEFT TOTAL HIP ARTHROPLASTY ANTERIOR APPROACH;  Surgeon: Mcarthur Rossetti, MD;  Location: [facility] ORS;  Service: Orthopedics;  Laterality: Left;    Prior to Admission medications   Medication Sig Start Date End Date Taking? Authorizing Provider  ACCU-CHEK AVIVA PLUS test strip use 1 TEST STRIP to TEST BLOOD SUGAR four times a day 12/17/17   [provider]  albuterol (PROVENTIL  HFA;VENTOLIN HFA) 108 (90 BASE) MCG/ACT inhaler Inhale 2 puffs into the lungs every 4 (four) hours as needed for wheezing or shortness of breath.     [provider]  azithromycin (ZITHROMAX Z-PAK) 250 MG tablet Take 2 tablets  (500 mg) on  Day 1,  followed by 1 tablet (250 mg) once daily on Days 2 through 5. 02/19/19 02/24/19  Sable Feil, PA-C  benzonatate (TESSALON PERLES) 100 MG capsule Take 2 capsules (200 mg total) by mouth 3 (three) times daily as needed. 02/19/19 02/19/20  Sable Feil, PA-C  Blood Glucose Monitoring Suppl (ACCU-CHEK AVIVA PLUS) w/Device KIT See admin instructions. 12/19/17   [provider]  budesonide-formoterol (SYMBICORT) 160-4.5 MCG/ACT inhaler Inhale 2 puffs into the lungs 2 (two) times daily.    [provider]  diclofenac sodium (VOLTAREN) 1 % GEL APP 2 GRAMS EXT AA QID 03/17/18   [provider]  diltiazem (CARDIZEM CD) 180 MG 24 hr capsule Take 180 mg by mouth at bedtime.     [provider]  FERREX 150 150 MG capsule TAKE 1 CAPSULE BY MOUTH TWICE A DAY 09/02/18   Lloyd Huger, MD  folic acid (FOLVITE) 1 MG tablet TAKE 1 TABLET(1 MG) BY MOUTH DAILY 02/01/18   Lin Landsman, MD  hydrochlorothiazide (HYDRODIURIL) 25 MG tablet take 1 tablet by mouth every morning 01/27/13   [provider]  insulin glargine (LANTUS) 100 UNIT/ML injection Inject 0.1 mLs (10 Units total) into the skin at bedtime. 11/19/17   Bettey Costa, MD  insulin lispro (HUMALOG) 100 UNIT/ML injection Please check your sugars before each meal and use lispro per the below sliding scale:  For sugars 151-200: take 2 units For 201-250: take 4units For 251-300: take 6 units For 301-350: take 8 units For 351-400: take 10 units For > 401: call MD 07/20/17   Gladstone Lighter, MD  Insulin Syringe-Needle U-100 (BD INSULIN SYRINGE ULTRAFINE) 31G X 5/16" 0.5 ML MISC use as directed once daily 06/25/17   [provider]  ipratropium-albuterol (DUONEB) 0.5-2.5 (3) MG/3ML SOLN Take 3 mLs by nebulization every 6 (six) hours as needed.    [provider]  methylPREDNISolone (MEDROL DOSEPAK) 4 MG TBPK tablet Take Tapered dose as directed 02/19/19   Sable Feil, PA-C   nicotine (NICODERM CQ - DOSED IN MG/24 HOURS) 21 mg/24hr patch Place 1 patch onto the skin daily. 08/07/17   [provider]  nitroGLYCERIN (NITROSTAT) 0.4 MG SL tablet Place 1 tablet under the tongue every 5 (five) minutes as needed. 08/17/17   [provider]  omeprazole (PRILOSEC) 20 MG capsule Take 20 mg by mouth 2 (two) times daily. 05/26/18   [provider]  oxyCODONE-acetaminophen (PERCOCET) 7.5-325 MG tablet Take 1-2 tablets by mouth every 6 (six) hours as needed for severe pain. Patient not taking: Reported on 10/28/2018 05/18/16   Mcarthur Rossetti, MD  predniSONE (DELTASONE) 10 MG tablet Label  & dispense according to the schedule below. 5 Pills PO for 1 day then, 4 Pills PO for 1 day, 3 Pills PO for 1 day, 2 Pills PO for 1 day, 1 Pill PO for 1 days then STOP. Patient not taking: Reported on 10/28/2018 05/16/18   Henreitta Leber, MD  promethazine (PHENERGAN) 25 MG tablet Take 25 mg by mouth every 4 (four) hours as needed.  04/05/18   [provider]  triamcinolone (KENALOG) 0.1 % paste APPLY TO AREA OF IRRITATION TID PRN FOR PAIN AND  SWELLING 07/12/18   [provider]  triamcinolone cream (KENALOG) 0.1 % Apply 1 application topically 2 (two) times daily.    [provider]  Vitamin D, Ergocalciferol, (DRISDOL) 1.25 MG (50000 UT) CAPS capsule Take 1 capsule (50,000 Units total) by mouth once a week. For 12 weeks 08/04/18   Lin Landsman, MD    Allergies Aspirin, Iodine, and Penicillins  Family History  Problem Relation Age of Onset  . Hypertension Mother   . Diabetes Mellitus II Mother   . Breast cancer Neg Hx   . Kidney cancer Neg Hx   . Bladder Cancer Neg Hx     Social History Social History   Tobacco Use  . Smoking status: Current Every Day Smoker    Packs/day: 1.00    Years: 48.00    Pack years: 48.00    Types: Cigarettes  . Smokeless tobacco: Never Used  Substance Use Topics  . Alcohol use: No  . Drug  use: No    Review of Systems  Constitutional: No fever/chills Eyes: No visual changes. ENT: No sore throat. Cardiovascular: Denies chest pain. Respiratory:  shortness of breath. Gastrointestinal: No abdominal pain.  No nausea, no vomiting.  No diarrhea.  No constipation. Genitourinary: Negative for dysuria. Musculoskeletal: Negative for back pain. Skin: Negative for rash. Neurological: Negative for headaches, focal weakness or numbness. Psychiatric:  Schizophrenia. Endocrine:  Hypertension Allergic/Immunilogical: Aspirin, iodine, penicillin.   ____________________________________________   PHYSICAL EXAM:  VITAL SIGNS: ED Triage Vitals  Enc Vitals Group     BP 02/19/19 1233 140/68     Pulse Rate 02/19/19 1233 (!) 104     Resp 02/19/19 1233 20     Temp 02/19/19 1233 97.6 F (36.4 C)     Temp Source 02/19/19 1233 Oral     SpO2 02/19/19 1233 93 %     Weight 02/19/19 1234 235 lb (106.6 kg)     Height 02/19/19 1234 5' 5" (1.651 m)     Head Circumference --      Peak Flow --      Pain Score 02/19/19 1234 7     Pain Loc --      Pain Edu? --      Excl. in [place]? --     Constitutional: Alert and oriented. Well appearing and in no acute distress.  Morbid obesity. Nose: No congestion/rhinnorhea. Mouth/Throat: Mucous membranes are moist.  Oropharynx non-erythematous. Neck: No stridor.   Cardiovascular: Normal rate, regular rhythm. Grossly normal heart sounds.  Good peripheral circulation. Respiratory: Normal respiratory effort.  No retractions. Lungs with mild expiratory wheezing. Gastrointestinal: Soft and nontender. No distention. No abdominal bruits. No CVA tenderness. Neurologic:  Normal speech and language. No gross focal neurologic deficits are appreciated. No gait instability. Skin:  Skin is warm, dry and intact. No rash noted. Psychiatric: Mood and affect are normal. Speech and behavior are normal.  ____________________________________________   LABS (all labs ordered  are listed, but only abnormal results are displayed)  Labs Reviewed  SARS CORONAVIRUS 2 (TAT 6-24 HRS)   ____________________________________________  EKG   ____________________________________________  RADIOLOGY  ED MD interpretation:    Official radiology report(s): Dg Chest 2 View  Result Date: 02/19/2019 CLINICAL DATA:  Cough since yesterday. EXAM: CHEST - 2 VIEW COMPARISON:  05/14/2018 FINDINGS: Heart size near the upper limit of normal with an interval decrease in size. Mildly tortuous aorta. Interval small amount of lingular patchy density. Clear right lung. The lungs remain mildly hyperexpanded with mildly prominent  interstitial markings and mild peribronchial thickening. Mild thoracic spine degenerative changes. IMPRESSION: 1. Interval small amount of lingular opacity suspicious for pneumonia. 2. Stable mild changes of COPD and chronic bronchitis with improved cardiomegaly. Electronically Signed   By: Claudie Revering M.D.   On: 02/19/2019 13:15    ____________________________________________   PROCEDURES  Procedure(s) performed (including Critical Care):  Procedures   ____________________________________________   INITIAL IMPRESSION / ASSESSMENT AND PLAN / ED COURSE  As part of my medical decision making, I reviewed the following data within the [place] was evaluated in Emergency Department on 02/19/2019 for the symptoms described in the history of present illness. She was evaluated in the context of the global COVID-19 pandemic, which necessitated consideration that the patient might be at risk for infection with the SARS-CoV-2 virus that causes COVID-19. Institutional protocols and algorithms that pertain to the evaluation of patients at risk for COVID-19 are in a state of rapid change based on information released by regulatory bodies including the CDC and federal and state organizations. These policies and algorithms  were followed during the patient's care in the ED.      Patient present with one day of non-productive cough. Patient has a history of COPD. Discussed X-ray finding with patient  showing early pneumonia. Patient given one DuoNeb treatment prior to departure. Patient given discharge carte instructions and advised to take mediations as directed.  Advise follow up for Covid-19 test results.  ____________________________________________   FINAL CLINICAL IMPRESSION(S) / ED DIAGNOSES  Final diagnoses:  Community acquired pneumonia of right middle lobe of lung     ED Discharge Orders         Ordered    azithromycin (ZITHROMAX Z-PAK) 250 MG tablet     02/19/19 1325    benzonatate (TESSALON PERLES) 100 MG capsule  3 times daily PRN     02/19/19 1325    methylPREDNISolone (MEDROL DOSEPAK) 4 MG TBPK tablet     02/19/19 1325           Note:  This document was prepared using Dragon voice recognition software and may include unintentional dictation errors.    Sable Feil, PA-C 02/19/19 1346    Arta Silence, MD 02/19/19 [phone]

## 2019-02-20 LAB — SARS CORONAVIRUS 2 (TAT 6-24 HRS): SARS Coronavirus 2: NEGATIVE

## 2019-02-26 ENCOUNTER — Other Ambulatory Visit: Payer: Self-pay | Admitting: Oncology

## 2019-02-28 ENCOUNTER — Other Ambulatory Visit: Payer: Self-pay

## 2019-02-28 DIAGNOSIS — Z20822 Contact with and (suspected) exposure to covid-19: Secondary | ICD-10-CM

## 2019-03-01 ENCOUNTER — Telehealth: Payer: Self-pay | Admitting: *Deleted

## 2019-03-01 DIAGNOSIS — R918 Other nonspecific abnormal finding of lung field: Secondary | ICD-10-CM

## 2019-03-01 DIAGNOSIS — Z122 Encounter for screening for malignant neoplasm of respiratory organs: Secondary | ICD-10-CM

## 2019-03-01 DIAGNOSIS — Z87891 Personal history of nicotine dependence: Secondary | ICD-10-CM

## 2019-03-01 LAB — NOVEL CORONAVIRUS, NAA: SARS-CoV-2, NAA: NOT DETECTED

## 2019-03-01 NOTE — Telephone Encounter (Signed)
Contacted and scheduled for LCS nodule follow up scan

## 2019-03-04 ENCOUNTER — Ambulatory Visit
Admission: RE | Admit: 2019-03-04 | Discharge: 2019-03-04 | Disposition: A | Discharge status: Home / Self Care | Payer: Medicare Other | Source: Ambulatory Visit | Attending: Oncology | Admitting: Oncology

## 2019-03-04 ENCOUNTER — Other Ambulatory Visit: Payer: Self-pay

## 2019-03-04 DIAGNOSIS — Z122 Encounter for screening for malignant neoplasm of respiratory organs: Secondary | ICD-10-CM | POA: Diagnosis not present

## 2019-03-04 DIAGNOSIS — Z87891 Personal history of nicotine dependence: Secondary | ICD-10-CM | POA: Diagnosis present

## 2019-03-04 DIAGNOSIS — R918 Other nonspecific abnormal finding of lung field: Secondary | ICD-10-CM | POA: Diagnosis present

## 2019-03-08 ENCOUNTER — Encounter: Payer: Self-pay | Admitting: *Deleted

## 2019-03-08 ENCOUNTER — Other Ambulatory Visit: Payer: Self-pay | Admitting: *Deleted

## 2019-03-08 DIAGNOSIS — D649 Anemia, unspecified: Secondary | ICD-10-CM

## 2019-03-09 ENCOUNTER — Other Ambulatory Visit: Payer: Self-pay

## 2019-03-09 ENCOUNTER — Inpatient Hospital Stay: Payer: Medicare Other | Attending: Oncology

## 2019-03-09 DIAGNOSIS — D649 Anemia, unspecified: Secondary | ICD-10-CM

## 2019-03-09 DIAGNOSIS — D509 Iron deficiency anemia, unspecified: Secondary | ICD-10-CM | POA: Diagnosis not present

## 2019-03-09 LAB — CBC WITH DIFFERENTIAL/PLATELET
Abs Immature Granulocytes: 0.02 10*3/uL (ref 0.00–0.07)
Basophils Absolute: 0 10*3/uL (ref 0.0–0.1)
Basophils Relative: 1 %
Eosinophils Absolute: 0.1 10*3/uL (ref 0.0–0.5)
Eosinophils Relative: 1 %
HCT: 36.8 % (ref 36.0–46.0)
Hemoglobin: 11.6 g/dL — ABNORMAL LOW (ref 12.0–15.0)
Immature Granulocytes: 0 %
Lymphocytes Relative: 26 %
Lymphs Abs: 1.7 10*3/uL (ref 0.7–4.0)
MCH: 27.8 pg (ref 26.0–34.0)
MCHC: 31.5 g/dL (ref 30.0–36.0)
MCV: 88.2 fL (ref 80.0–100.0)
Monocytes Absolute: 0.4 10*3/uL (ref 0.1–1.0)
Monocytes Relative: 7 %
Neutro Abs: 4.1 10*3/uL (ref 1.7–7.7)
Neutrophils Relative %: 65 %
Platelets: 206 10*3/uL (ref 150–400)
RBC: 4.17 MIL/uL (ref 3.87–5.11)
RDW: 15.9 % — ABNORMAL HIGH (ref 11.5–15.5)
WBC: 6.4 10*3/uL (ref 4.0–10.5)
nRBC: 0 % (ref 0.0–0.2)

## 2019-03-09 LAB — IRON AND TIBC
Iron: 143 ug/dL (ref 28–170)
Saturation Ratios: 38 % — ABNORMAL HIGH (ref 10.4–31.8)
TIBC: 377 ug/dL (ref 250–450)
UIBC: 234 ug/dL

## 2019-03-09 LAB — FERRITIN: Ferritin: 35 ng/mL (ref 11–307)

## 2019-04-29 ENCOUNTER — Other Ambulatory Visit: Payer: Self-pay

## 2019-04-29 NOTE — Progress Notes (Signed)
Patient pre screened for office appointment, no questions or concerns today. Patient reminded of upcoming appointment time and date. 

## 2019-05-01 NOTE — Progress Notes (Signed)
[facility]  Telephone:(336) [phone] Fax:(336) [phone]  ID: DONN WILMOT OB: 11/15/1955  MR#: [id number]  YDX#:[id number]  Patient Care Team: Kirk Ruths, MD as PCP - General (Internal Medicine)   CHIEF COMPLAINT: Iron deficiency anemia.  INTERVAL HISTORY: Patient returns to clinic today for repeat laboratory can further evaluation.  She does not complain of any weakness or fatigue today.  She has occasional dizziness, particularly with standing too quickly.  She has chronic shortness of breath and plans to see her pulmonologist in the near future. She has no neurologic complaints.  She denies any recent fevers or illnesses. She has no chest pain, cough, or hemoptysis. She denies any nausea, vomiting, constipation, or diarrhea. She has no melena or hematochezia.  She has no urinary complaints.  Patient offers no further specific complaints today.  REVIEW OF SYSTEMS:   Review of Systems  Constitutional: Negative.  Negative for fever, malaise/fatigue and weight loss.  Respiratory: Positive for shortness of breath. Negative for cough and hemoptysis.   Cardiovascular: Negative.  Negative for chest pain and leg swelling.  Gastrointestinal: Negative.  Negative for abdominal pain, blood in stool and melena.  Genitourinary: Negative.  Negative for frequency and hematuria.  Musculoskeletal: Negative.  Negative for myalgias.  Skin: Negative.  Negative for rash.  Neurological: Positive for dizziness. Negative for focal weakness, weakness and headaches.  Psychiatric/Behavioral: Negative.  The patient is not nervous/anxious.     As per HPI. Otherwise, a complete review of systems is negative.  PAST MEDICAL HISTORY: Past Medical History:  Diagnosis Date  . Anemia   . Arthritis   . Cancer ([place])    cervical  . COPD (chronic obstructive pulmonary disease) ([place])   . COPD exacerbation ([place]) 07/19/2017  . Diabetes mellitus without complication ([place])   . GERD  (gastroesophageal reflux disease)   . Hypertension   . Schizophrenia ([place])   . Sleep apnea     PAST SURGICAL HISTORY: Past Surgical History:  Procedure Laterality Date  . APPENDECTOMY    . COLONOSCOPY WITH PROPOFOL N/A 12/25/2014   Procedure: COLONOSCOPY WITH PROPOFOL;  Surgeon: Josefine Class, MD;  Location: [facility] ENDOSCOPY;  Service: Endoscopy;  Laterality: N/A;  . COLONOSCOPY WITH PROPOFOL N/A 01/21/2018   Procedure: COLONOSCOPY WITH PROPOFOL;  Surgeon: Lin Landsman, MD;  Location: [facility] ENDOSCOPY;  Service: Gastroenterology;  Laterality: N/A;  . ESOPHAGOGASTRODUODENOSCOPY (EGD) WITH PROPOFOL N/A 12/25/2014   Procedure: ESOPHAGOGASTRODUODENOSCOPY (EGD) WITH PROPOFOL;  Surgeon: Josefine Class, MD;  Location: [facility] ENDOSCOPY;  Service: Endoscopy;  Laterality: N/A;  . ESOPHAGOGASTRODUODENOSCOPY (EGD) WITH PROPOFOL N/A 01/21/2018   Procedure: ESOPHAGOGASTRODUODENOSCOPY (EGD) WITH PROPOFOL;  Surgeon: Lin Landsman, MD;  Location: [facility] ENDOSCOPY;  Service: Gastroenterology;  Laterality: N/A;  . GIVENS CAPSULE STUDY N/A 03/02/2018   Procedure: GIVENS CAPSULE STUDY;  Surgeon: Lin Landsman, MD;  Location: [facility] ENDOSCOPY;  Service: Gastroenterology;  Laterality: N/A;  . JOINT REPLACEMENT     right hip  . TOTAL HIP ARTHROPLASTY Left 05/16/2016   Procedure: LEFT TOTAL HIP ARTHROPLASTY ANTERIOR APPROACH;  Surgeon: Mcarthur Rossetti, MD;  Location: [facility] ORS;  Service: Orthopedics;  Laterality: Left;    FAMILY HISTORY Family History  Problem Relation Age of Onset  . Hypertension Mother   . Diabetes Mellitus II Mother   . Breast cancer Neg Hx   . Kidney cancer Neg Hx   . Bladder Cancer Neg Hx        ADVANCED DIRECTIVES:    HEALTH MAINTENANCE: Social History  Tobacco Use  . Smoking status: Current Every Day Smoker    Packs/day: 1.00    Years: 48.00    Pack years: 48.00    Types: Cigarettes  . Smokeless tobacco: Never Used  Substance Use Topics  . Alcohol  use: No  . Drug use: No     Colonoscopy:  PAP:  Bone density:  Lipid panel:  Allergies  Allergen Reactions  . Aspirin Other (See Comments)    Ongoing anemia  . Iodine Swelling  . Penicillins Swelling    Has patient had a PCN reaction causing immediate rash, facial/tongue/throat swelling, SOB or lightheadedness with hypotension: Yes Has patient had a PCN reaction causing severe rash involving mucus membranes or skin necrosis: Yes Has patient had a PCN reaction that required hospitalization: No Has patient had a PCN reaction occurring within the last 10 years: No If all of the above answers are "NO", then may proceed with Cephalosporin use.     Current Outpatient Medications  Medication Sig Dispense Refill  . ACCU-CHEK AVIVA PLUS test strip use 1 TEST STRIP to TEST BLOOD SUGAR four times a day  0  . albuterol (PROVENTIL HFA;VENTOLIN HFA) 108 (90 BASE) MCG/ACT inhaler Inhale 2 puffs into the lungs every 4 (four) hours as needed for wheezing or shortness of breath.     . benzonatate (TESSALON PERLES) 100 MG capsule Take 2 capsules (200 mg total) by mouth 3 (three) times daily as needed. 30 capsule 0  . Blood Glucose Monitoring Suppl (ACCU-CHEK AVIVA PLUS) w/Device KIT See admin instructions.  0  . budesonide-formoterol (SYMBICORT) 160-4.5 MCG/ACT inhaler Inhale 2 puffs into the lungs 2 (two) times daily.    . diclofenac sodium (VOLTAREN) 1 % GEL APP 2 GRAMS EXT AA QID  11  . diltiazem (CARDIZEM CD) 180 MG 24 hr capsule Take 180 mg by mouth at bedtime.     Marland Kitchen FERREX 150 150 MG capsule TAKE 1 CAPSULE BY MOUTH TWICE DAILY 244 capsule 0  . folic acid (FOLVITE) 1 MG tablet TAKE 1 TABLET(1 MG) BY MOUTH DAILY 90 tablet 0  . hydrochlorothiazide (HYDRODIURIL) 25 MG tablet take 1 tablet by mouth every morning    . insulin glargine (LANTUS) 100 UNIT/ML injection Inject 0.1 mLs (10 Units total) into the skin at bedtime. 10 mL 11  . insulin lispro (HUMALOG) 100 UNIT/ML injection Please check your  sugars before each meal and use lispro per the below sliding scale:  For sugars 151-200: take 2 units For 201-250: take 4units For 251-300: take 6 units For 301-350: take 8 units For 351-400: take 10 units For > 401: call MD 10 mL 11  . Insulin Syringe-Needle U-100 (BD INSULIN SYRINGE ULTRAFINE) 31G X 5/16" 0.5 ML MISC use as directed once daily    . ipratropium-albuterol (DUONEB) 0.5-2.5 (3) MG/3ML SOLN Take 3 mLs by nebulization every 6 (six) hours as needed.    . methylPREDNISolone (MEDROL DOSEPAK) 4 MG TBPK tablet Take Tapered dose as directed 21 tablet 0  . nitroGLYCERIN (NITROSTAT) 0.4 MG SL tablet Place 1 tablet under the tongue every 5 (five) minutes as needed.  0  . omeprazole (PRILOSEC) 20 MG capsule Take 20 mg by mouth 2 (two) times daily.    Marland Kitchen oxyCODONE-acetaminophen (PERCOCET) 7.5-325 MG tablet Take 1-2 tablets by mouth every 6 (six) hours as needed for severe pain. 30 tablet 0  . predniSONE (DELTASONE) 10 MG tablet Label  & dispense according to the schedule below. 5 Pills PO for 1 day  then, 4 Pills PO for 1 day, 3 Pills PO for 1 day, 2 Pills PO for 1 day, 1 Pill PO for 1 days then STOP. 15 tablet 0  . promethazine (PHENERGAN) 25 MG tablet Take 25 mg by mouth every 4 (four) hours as needed.   0  . triamcinolone (KENALOG) 0.1 % paste APPLY TO AREA OF IRRITATION TID PRN FOR PAIN AND SWELLING    . triamcinolone cream (KENALOG) 0.1 % Apply 1 application topically 2 (two) times daily.    . Vitamin D, Ergocalciferol, (DRISDOL) 1.25 MG (50000 UT) CAPS capsule Take 1 capsule (50,000 Units total) by mouth once a week. For 12 weeks 12 capsule 0   No current facility-administered medications for this visit.    OBJECTIVE: Vitals:   05/02/19 1315  BP: 121/69  Pulse: 85  Resp: 18  Temp: 97.7 F (36.5 C)  SpO2: 97%     Body mass index is 39.61 kg/m.    ECOG FS:0 - Asymptomatic  General: Well-developed, well-nourished, no acute distress. Eyes: Pink conjunctiva, anicteric sclera.  HEENT: Normocephalic, moist mucous membranes. Lungs: No audible wheezing or coughing. Heart: Regular rate and rhythm. Abdomen: Soft, nontender, no obvious distention. Musculoskeletal: No edema, cyanosis, or clubbing. Neuro: Alert, answering all questions appropriately. Cranial nerves grossly intact. Skin: No rashes or petechiae noted. Psych: Normal affect.  LAB RESULTS:  Lab Results  Component Value Date   NA 136 10/28/2018   K 3.8 10/28/2018   CL 98 10/28/2018   CO2 29 10/28/2018   GLUCOSE 131 (H) 10/28/2018   BUN 19 10/28/2018   CREATININE 1.18 (H) 10/28/2018   CALCIUM 8.1 (L) 10/28/2018   PROT 7.2 07/28/2018   ALBUMIN 3.7 (L) 07/28/2018   AST 7 07/28/2018   ALT 8 07/28/2018   ALKPHOS 137 (H) 07/28/2018   BILITOT <0.2 07/28/2018   GFRNONAA 49 (L) 10/28/2018   GFRAA 57 (L) 10/28/2018    Lab Results  Component Value Date   WBC 9.4 05/02/2019   NEUTROABS 7.5 05/02/2019   HGB 12.3 05/02/2019   HCT 40.9 05/02/2019   MCV 92.5 05/02/2019   PLT 240 05/02/2019   Lab Results  Component Value Date   FERRITIN 35 03/09/2019   Lab Results  Component Value Date   IRON 143 03/09/2019   TIBC 377 03/09/2019   IRONPCTSAT 38 (H) 03/09/2019      STUDIES: No results found.  ASSESSMENT: Iron deficiency anemia.  PLAN:    1. Iron deficiency anemia: Patient's hemoglobin continues to be within normal limits.  Her iron stores are pending at time of dictation.  She last received IV Feraheme on January 15, 2018.  Colonoscopy on January 21, 2018 revealed 6 polyps that were negative for malignancy.  Recommendation was to repeat in 3 years.  EGD did not reveal any evidence of bleeding.  Patient reports her capsule endoscopy was also normal, but we do not have these results.  No interventions are needed at this time.  Patient has been instructed to continue oral iron supplementation, but decrease her dosage to 1 tab daily.  Return to clinic in 6 months for repeat laboratory work and  video assisted telemedicine visit.  If patient's iron stores and hemoglobin continues to be within normal limits, she likely can be discharged from clinic.    2. Hyperglycemia: Patient continues to have poor glycemic control.  Continue insulin and diabetic medications as prescribed. 3.  Chronic renal insufficiency: Patient's most recent creatinine was 1.18.  Which is approximately her  baseline. 4.  Shortness of breath: Likely related to underlying COPD.  Continue follow-up with pulmonology as indicated. 5.  Smoking cessation: Patient continues to smoke heavily and has declined cessation counseling multiple times in the past.  Patient expressed understanding and was in agreement with this plan. She also understands that She can call clinic at any time with any questions, concerns, or complaints.    Lloyd Huger, MD   05/02/2019 1:46 PM

## 2019-05-02 ENCOUNTER — Inpatient Hospital Stay: Payer: Medicare Other | Attending: Oncology

## 2019-05-02 ENCOUNTER — Other Ambulatory Visit: Payer: Self-pay

## 2019-05-02 ENCOUNTER — Encounter: Payer: Self-pay | Admitting: Oncology

## 2019-05-02 ENCOUNTER — Inpatient Hospital Stay: Payer: Medicare Other

## 2019-05-02 ENCOUNTER — Inpatient Hospital Stay (HOSPITAL_BASED_OUTPATIENT_CLINIC_OR_DEPARTMENT_OTHER): Payer: Medicare Other | Admitting: Oncology

## 2019-05-02 VITALS — BP 121/69 | HR 85 | Temp 97.7°F | Resp 18 | Wt 238.0 lb

## 2019-05-02 DIAGNOSIS — R42 Dizziness and giddiness: Secondary | ICD-10-CM | POA: Diagnosis not present

## 2019-05-02 DIAGNOSIS — D509 Iron deficiency anemia, unspecified: Secondary | ICD-10-CM | POA: Insufficient documentation

## 2019-05-02 DIAGNOSIS — E1122 Type 2 diabetes mellitus with diabetic chronic kidney disease: Secondary | ICD-10-CM | POA: Insufficient documentation

## 2019-05-02 DIAGNOSIS — N189 Chronic kidney disease, unspecified: Secondary | ICD-10-CM | POA: Diagnosis not present

## 2019-05-02 DIAGNOSIS — J449 Chronic obstructive pulmonary disease, unspecified: Secondary | ICD-10-CM | POA: Insufficient documentation

## 2019-05-02 LAB — CBC WITH DIFFERENTIAL/PLATELET
Abs Immature Granulocytes: 0.05 10*3/uL (ref 0.00–0.07)
Basophils Absolute: 0 10*3/uL (ref 0.0–0.1)
Basophils Relative: 0 %
Eosinophils Absolute: 0.1 10*3/uL (ref 0.0–0.5)
Eosinophils Relative: 1 %
HCT: 40.9 % (ref 36.0–46.0)
Hemoglobin: 12.3 g/dL (ref 12.0–15.0)
Immature Granulocytes: 1 %
Lymphocytes Relative: 14 %
Lymphs Abs: 1.3 10*3/uL (ref 0.7–4.0)
MCH: 27.8 pg (ref 26.0–34.0)
MCHC: 30.1 g/dL (ref 30.0–36.0)
MCV: 92.5 fL (ref 80.0–100.0)
Monocytes Absolute: 0.5 10*3/uL (ref 0.1–1.0)
Monocytes Relative: 5 %
Neutro Abs: 7.5 10*3/uL (ref 1.7–7.7)
Neutrophils Relative %: 79 %
Platelets: 240 10*3/uL (ref 150–400)
RBC: 4.42 MIL/uL (ref 3.87–5.11)
RDW: 15.2 % (ref 11.5–15.5)
WBC: 9.4 10*3/uL (ref 4.0–10.5)
nRBC: 0 % (ref 0.0–0.2)

## 2019-05-02 LAB — IRON AND TIBC
Iron: 53 ug/dL (ref 28–170)
Saturation Ratios: 14 % (ref 10.4–31.8)
TIBC: 377 ug/dL (ref 250–450)
UIBC: 324 ug/dL

## 2019-05-02 LAB — FERRITIN: Ferritin: 29 ng/mL (ref 11–307)

## 2019-06-02 ENCOUNTER — Other Ambulatory Visit: Payer: Self-pay | Admitting: Internal Medicine

## 2019-06-02 DIAGNOSIS — Z1231 Encounter for screening mammogram for malignant neoplasm of breast: Secondary | ICD-10-CM

## 2019-06-30 ENCOUNTER — Ambulatory Visit
Admission: RE | Admit: 2019-06-30 | Discharge: 2019-06-30 | Disposition: A | Discharge status: Home / Self Care | Payer: Medicare Other | Source: Ambulatory Visit | Attending: Internal Medicine | Admitting: Internal Medicine

## 2019-06-30 DIAGNOSIS — Z1231 Encounter for screening mammogram for malignant neoplasm of breast: Secondary | ICD-10-CM | POA: Insufficient documentation

## 2019-08-03 ENCOUNTER — Other Ambulatory Visit: Payer: Self-pay | Admitting: Oncology

## 2019-10-29 NOTE — Progress Notes (Deleted)
[place]  [phone] [phone]  ID: Deanna Gay OB: Jan 03, 1956  MR#: [id number]  NOI#:[id number]  Patient Care Team: Kirk Ruths, MD as PCP - General (Internal Medicine)  I connected with Deanna Gay on 10/29/19 at  2:15 PM EDT by {Blank single:19197::"video enabled telemedicine visit","telephone visit"} and verified that I am speaking with the correct person using two identifiers.   I discussed the limitations, risks, security and privacy concerns of performing an evaluation and management service by telemedicine and the availability of in-person appointments. I also discussed with the patient that there may be a patient responsible charge related to this service. The patient expressed understanding and agreed to proceed.   Other persons participating in the visit and their role in the encounter: Patient, MD.  Patient's location: Home. Provider's location: Clinic.  CHIEF COMPLAINT: Iron deficiency anemia.  INTERVAL HISTORY: Patient returns to clinic today for repeat laboratory can further evaluation.  She does not complain of any weakness or fatigue today.  She has occasional dizziness, particularly with standing too quickly.  She has chronic shortness of breath and plans to see her pulmonologist in the near future. She has no neurologic complaints.  She denies any recent fevers or illnesses. She has no chest pain, cough, or hemoptysis. She denies any nausea, vomiting, constipation, or diarrhea. She has no melena or hematochezia.  She has no urinary complaints.  Patient offers no further specific complaints today.  REVIEW OF SYSTEMS:   Review of Systems  Constitutional: Negative.  Negative for fever, malaise/fatigue and weight loss.  Respiratory: Positive for shortness of breath. Negative for cough and hemoptysis.   Cardiovascular: Negative.  Negative for chest pain and leg swelling.  Gastrointestinal: Negative.  Negative  for abdominal pain, blood in stool and melena.  Genitourinary: Negative.  Negative for frequency and hematuria.  Musculoskeletal: Negative.  Negative for myalgias.  Skin: Negative.  Negative for rash.  Neurological: Positive for dizziness. Negative for focal weakness, weakness and headaches.  Psychiatric/Behavioral: Negative.  The patient is not nervous/anxious.     As per HPI. Otherwise, a complete review of systems is negative.  PAST MEDICAL HISTORY: Past Medical History:  Diagnosis Date  . Anemia   . Arthritis   . Cancer ([place])    cervical  . COPD (chronic obstructive pulmonary disease) ([place])   . COPD exacerbation ([place]) 07/19/2017  . Diabetes mellitus without complication ([place])   . GERD (gastroesophageal reflux disease)   . Hypertension   . Schizophrenia ([place])   . Sleep apnea     PAST SURGICAL HISTORY: Past Surgical History:  Procedure Laterality Date  . APPENDECTOMY    . COLONOSCOPY WITH PROPOFOL N/A 12/25/2014   Procedure: COLONOSCOPY WITH PROPOFOL;  Surgeon: Josefine Class, MD;  Location: [facility] ENDOSCOPY;  Service: Endoscopy;  Laterality: N/A;  . COLONOSCOPY WITH PROPOFOL N/A 01/21/2018   Procedure: COLONOSCOPY WITH PROPOFOL;  Surgeon: Lin Landsman, MD;  Location: [facility] ENDOSCOPY;  Service: Gastroenterology;  Laterality: N/A;  . ESOPHAGOGASTRODUODENOSCOPY (EGD) WITH PROPOFOL N/A 12/25/2014   Procedure: ESOPHAGOGASTRODUODENOSCOPY (EGD) WITH PROPOFOL;  Surgeon: Josefine Class, MD;  Location: [facility] ENDOSCOPY;  Service: Endoscopy;  Laterality: N/A;  . ESOPHAGOGASTRODUODENOSCOPY (EGD) WITH PROPOFOL N/A 01/21/2018   Procedure: ESOPHAGOGASTRODUODENOSCOPY (EGD) WITH PROPOFOL;  Surgeon: Lin Landsman, MD;  Location: [facility] ENDOSCOPY;  Service: Gastroenterology;  Laterality: N/A;  . GIVENS CAPSULE STUDY N/A 03/02/2018   Procedure: GIVENS CAPSULE STUDY;  Surgeon: Lin Landsman, MD;  Location: [facility] ENDOSCOPY;  Service: Gastroenterology;  Laterality: N/A;  . JOINT  REPLACEMENT     right hip  . TOTAL HIP ARTHROPLASTY Left 05/16/2016   Procedure: LEFT TOTAL HIP ARTHROPLASTY ANTERIOR APPROACH;  Surgeon: Mcarthur Rossetti, MD;  Location: [facility] ORS;  Service: Orthopedics;  Laterality: Left;    FAMILY HISTORY Family History  Problem Relation Age of Onset  . Hypertension Mother   . Diabetes Mellitus II Mother   . Breast cancer Neg Hx   . Kidney cancer Neg Hx   . Bladder Cancer Neg Hx        ADVANCED DIRECTIVES:    HEALTH MAINTENANCE: Social History   Tobacco Use  . Smoking status: Current Every Day Smoker    Packs/day: 1.00    Years: 48.00    Pack years: 48.00    Types: Cigarettes  . Smokeless tobacco: Never Used  Vaping Use  . Vaping Use: Never used  Substance Use Topics  . Alcohol use: No  . Drug use: No     Colonoscopy:  PAP:  Bone density:  Lipid panel:  Allergies  Allergen Reactions  . Aspirin Other (See Comments)    Ongoing anemia  . Iodine Swelling  . Penicillins Swelling    Has patient had a PCN reaction causing immediate rash, facial/tongue/throat swelling, SOB or lightheadedness with hypotension: Yes Has patient had a PCN reaction causing severe rash involving mucus membranes or skin necrosis: Yes Has patient had a PCN reaction that required hospitalization: No Has patient had a PCN reaction occurring within the last 10 years: No If all of the above answers are "NO", then may proceed with Cephalosporin use.     Current Outpatient Medications  Medication Sig Dispense Refill  . ACCU-CHEK AVIVA PLUS test strip use 1 TEST STRIP to TEST BLOOD SUGAR four times a day  0  . albuterol (PROVENTIL HFA;VENTOLIN HFA) 108 (90 BASE) MCG/ACT inhaler Inhale 2 puffs into the lungs every 4 (four) hours as needed for wheezing or shortness of breath.     . benzonatate (TESSALON PERLES) 100 MG capsule Take 2 capsules (200 mg total) by mouth 3 (three) times daily as needed. 30 capsule 0  . Blood Glucose Monitoring Suppl (ACCU-CHEK  AVIVA PLUS) w/Device KIT See admin instructions.  0  . budesonide-formoterol (SYMBICORT) 160-4.5 MCG/ACT inhaler Inhale 2 puffs into the lungs 2 (two) times daily.    . diclofenac sodium (VOLTAREN) 1 % GEL APP 2 GRAMS EXT AA QID  11  . diltiazem (CARDIZEM CD) 180 MG 24 hr capsule Take 180 mg by mouth at bedtime.     Marland Kitchen FERREX 150 150 MG capsule TAKE 1 CAPSULE BY MOUTH TWICE A DAY 633 capsule 0  . folic acid (FOLVITE) 1 MG tablet TAKE 1 TABLET(1 MG) BY MOUTH DAILY 90 tablet 0  . hydrochlorothiazide (HYDRODIURIL) 25 MG tablet take 1 tablet by mouth every morning    . insulin glargine (LANTUS) 100 UNIT/ML injection Inject 0.1 mLs (10 Units total) into the skin at bedtime. 10 mL 11  . insulin lispro (HUMALOG) 100 UNIT/ML injection Please check your sugars before each meal and use lispro per the below sliding scale:  For sugars 151-200: take 2 units For 201-250: take 4units For 251-300: take 6 units For 301-350: take 8 units For 351-400: take 10 units For > 401: call MD 10 mL 11  . Insulin Syringe-Needle U-100 (BD INSULIN SYRINGE ULTRAFINE) 31G X 5/16" 0.5 ML MISC use as directed once daily    . ipratropium-albuterol (DUONEB)  0.5-2.5 (3) MG/3ML SOLN Take 3 mLs by nebulization every 6 (six) hours as needed.    . methylPREDNISolone (MEDROL DOSEPAK) 4 MG TBPK tablet Take Tapered dose as directed 21 tablet 0  . nitroGLYCERIN (NITROSTAT) 0.4 MG SL tablet Place 1 tablet under the tongue every 5 (five) minutes as needed.  0  . omeprazole (PRILOSEC) 20 MG capsule Take 20 mg by mouth 2 (two) times daily.    Marland Kitchen oxyCODONE-acetaminophen (PERCOCET) 7.5-325 MG tablet Take 1-2 tablets by mouth every 6 (six) hours as needed for severe pain. 30 tablet 0  . predniSONE (DELTASONE) 10 MG tablet Label  & dispense according to the schedule below. 5 Pills PO for 1 day then, 4 Pills PO for 1 day, 3 Pills PO for 1 day, 2 Pills PO for 1 day, 1 Pill PO for 1 days then STOP. 15 tablet 0  . promethazine (PHENERGAN) 25 MG tablet  Take 25 mg by mouth every 4 (four) hours as needed.   0  . triamcinolone (KENALOG) 0.1 % paste APPLY TO AREA OF IRRITATION TID PRN FOR PAIN AND SWELLING    . triamcinolone cream (KENALOG) 0.1 % Apply 1 application topically 2 (two) times daily.    . Vitamin D, Ergocalciferol, (DRISDOL) 1.25 MG (50000 UT) CAPS capsule Take 1 capsule (50,000 Units total) by mouth once a week. For 12 weeks 12 capsule 0   No current facility-administered medications for this visit.    OBJECTIVE: There were no vitals filed for this visit.   There is no height or weight on file to calculate BMI.    ECOG FS:0 - Asymptomatic  General: Well-developed, well-nourished, no acute distress. Eyes: Pink conjunctiva, anicteric sclera. HEENT: Normocephalic, moist mucous membranes. Lungs: No audible wheezing or coughing. Heart: Regular rate and rhythm. Abdomen: Soft, nontender, no obvious distention. Musculoskeletal: No edema, cyanosis, or clubbing. Neuro: Alert, answering all questions appropriately. Cranial nerves grossly intact. Skin: No rashes or petechiae noted. Psych: Normal affect.  LAB RESULTS:  Lab Results  Component Value Date   NA 136 10/28/2018   K 3.8 10/28/2018   CL 98 10/28/2018   CO2 29 10/28/2018   GLUCOSE 131 (H) 10/28/2018   BUN 19 10/28/2018   CREATININE 1.18 (H) 10/28/2018   CALCIUM 8.1 (L) 10/28/2018   PROT 7.2 07/28/2018   ALBUMIN 3.7 (L) 07/28/2018   AST 7 07/28/2018   ALT 8 07/28/2018   ALKPHOS 137 (H) 07/28/2018   BILITOT <0.2 07/28/2018   GFRNONAA 49 (L) 10/28/2018   GFRAA 57 (L) 10/28/2018    Lab Results  Component Value Date   WBC 9.4 05/02/2019   NEUTROABS 7.5 05/02/2019   HGB 12.3 05/02/2019   HCT 40.9 05/02/2019   MCV 92.5 05/02/2019   PLT 240 05/02/2019   Lab Results  Component Value Date   FERRITIN 29 05/02/2019   Lab Results  Component Value Date   IRON 53 05/02/2019   TIBC 377 05/02/2019   IRONPCTSAT 14 05/02/2019      STUDIES: No results  found.  ASSESSMENT: Iron deficiency anemia.  PLAN:    1. Iron deficiency anemia: Patient's hemoglobin continues to be within normal limits.  Her iron stores are pending at time of dictation.  She last received IV Feraheme on January 15, 2018.  Colonoscopy on January 21, 2018 revealed 6 polyps that were negative for malignancy.  Recommendation was to repeat in 3 years.  EGD did not reveal any evidence of bleeding.  Patient reports her capsule endoscopy was  also normal, but we do not have these results.  No interventions are needed at this time.  Patient has been instructed to continue oral iron supplementation, but decrease her dosage to 1 tab daily.  Return to clinic in 6 months for repeat laboratory work and video assisted telemedicine visit.  If patient's iron stores and hemoglobin continues to be within normal limits, she likely can be discharged from clinic.    2. Hyperglycemia: Patient continues to have poor glycemic control.  Continue insulin and diabetic medications as prescribed. 3.  Chronic renal insufficiency: Patient's most recent creatinine was 1.18.  Which is approximately her baseline. 4.  Shortness of breath: Likely related to underlying COPD.  Continue follow-up with pulmonology as indicated. 5.  Smoking cessation: Patient continues to smoke heavily and has declined cessation counseling multiple times in the past.  I provided *** minutes of {Blank single:19197::"face-to-face video visit time","non face-to-face telephone visit time"} during this encounter which included chart review, counseling, and coordination of care as documented above.   Patient expressed understanding and was in agreement with this plan. She also understands that She can call clinic at any time with any questions, concerns, or complaints.    Lloyd Huger, MD   10/29/2019 7:40 AM

## 2019-10-31 ENCOUNTER — Other Ambulatory Visit: Payer: Self-pay | Admitting: Emergency Medicine

## 2019-10-31 ENCOUNTER — Inpatient Hospital Stay: Payer: Medicare Other | Admitting: Oncology

## 2019-10-31 ENCOUNTER — Other Ambulatory Visit: Payer: Self-pay

## 2019-10-31 ENCOUNTER — Inpatient Hospital Stay: Payer: Medicare Other | Attending: Oncology

## 2019-10-31 DIAGNOSIS — D509 Iron deficiency anemia, unspecified: Secondary | ICD-10-CM

## 2019-10-31 LAB — IRON AND TIBC
Iron: 42 ug/dL (ref 28–170)
Saturation Ratios: 11 % (ref 10.4–31.8)
TIBC: 396 ug/dL (ref 250–450)
UIBC: 354 ug/dL

## 2019-10-31 LAB — CBC WITH DIFFERENTIAL/PLATELET
Abs Immature Granulocytes: 0.02 10*3/uL (ref 0.00–0.07)
Basophils Absolute: 0 10*3/uL (ref 0.0–0.1)
Basophils Relative: 0 %
Eosinophils Absolute: 0.1 10*3/uL (ref 0.0–0.5)
Eosinophils Relative: 1 %
HCT: 40.8 % (ref 36.0–46.0)
Hemoglobin: 13.1 g/dL (ref 12.0–15.0)
Immature Granulocytes: 0 %
Lymphocytes Relative: 27 %
Lymphs Abs: 1.9 10*3/uL (ref 0.7–4.0)
MCH: 27.9 pg (ref 26.0–34.0)
MCHC: 32.1 g/dL (ref 30.0–36.0)
MCV: 86.8 fL (ref 80.0–100.0)
Monocytes Absolute: 0.4 10*3/uL (ref 0.1–1.0)
Monocytes Relative: 6 %
Neutro Abs: 4.7 10*3/uL (ref 1.7–7.7)
Neutrophils Relative %: 66 %
Platelets: 226 10*3/uL (ref 150–400)
RBC: 4.7 MIL/uL (ref 3.87–5.11)
RDW: 14.8 % (ref 11.5–15.5)
WBC: 7.1 10*3/uL (ref 4.0–10.5)
nRBC: 0 % (ref 0.0–0.2)

## 2019-10-31 LAB — FERRITIN: Ferritin: 33 ng/mL (ref 11–307)

## 2019-10-31 LAB — BASIC METABOLIC PANEL
Anion gap: 10 (ref 5–15)
BUN: 14 mg/dL (ref 8–23)
CO2: 28 mmol/L (ref 22–32)
Calcium: 8.5 mg/dL — ABNORMAL LOW (ref 8.9–10.3)
Chloride: 101 mmol/L (ref 98–111)
Creatinine, Ser: 1.33 mg/dL — ABNORMAL HIGH (ref 0.44–1.00)
GFR calc Af Amer: 49 mL/min — ABNORMAL LOW (ref >60)
GFR calc non Af Amer: 42 mL/min — ABNORMAL LOW (ref >60)
Glucose, Bld: 171 mg/dL — ABNORMAL HIGH (ref 70–99)
Potassium: 4 mmol/L (ref 3.5–5.1)
Sodium: 139 mmol/L (ref 135–145)

## 2019-12-25 IMAGING — CT CT CHEST LCS NODULE FOLLOW-UP W/O CM
2 of 5 series · 15 of 40 positions shown, 18 images · non-contrast
Comparison: Low-dose lung cancer screening CT chest dated
07/26/2018

CLINICAL DATA: 63-year-old female current smoker, with 48 pack-year
history of smoking, for short-term follow-up lung cancer screening

EXAM:
CT CHEST WITHOUT CONTRAST FOR LUNG CANCER SCREENING NODULE FOLLOW-UP
TECHNIQUE: Multidetector CT imaging of the chest was performed following the
standard protocol without IV contrast.

[Series 3: lung lcs f/u · axial · 0.67mm/px · z∈[-1241,-929]mm · 12 of 348 slices shown, 15 images]
[im 18/348  mediastinal]
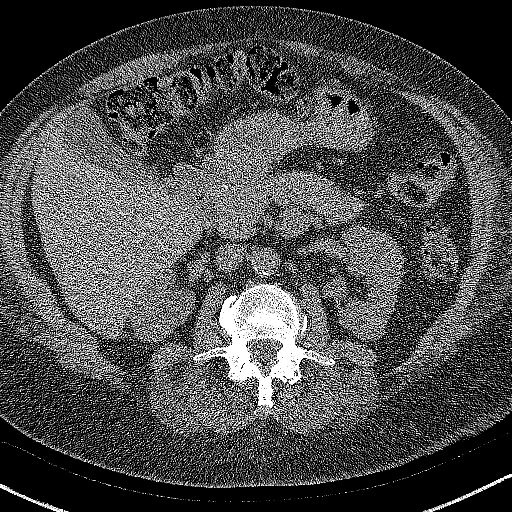
[im 18/348  lung]
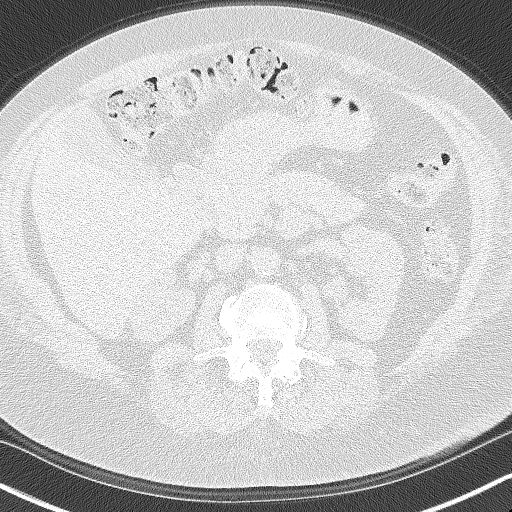
[im 53/348  lung]
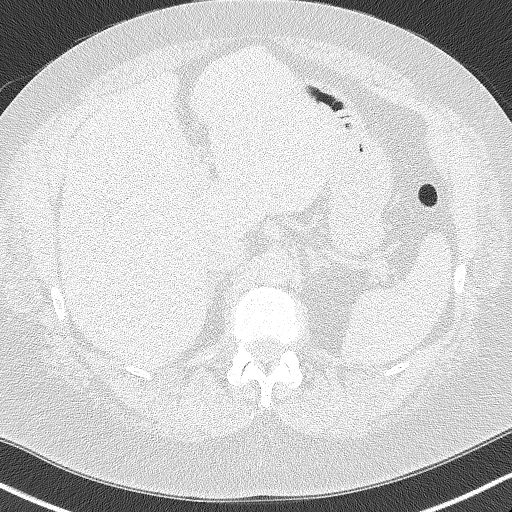
[im 70/348  lung]
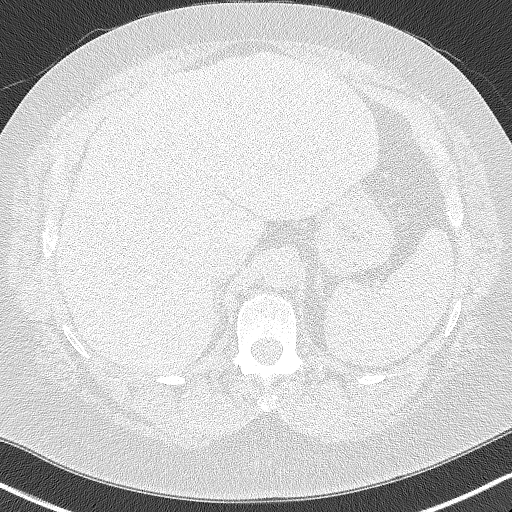
[im 105/348  lung]
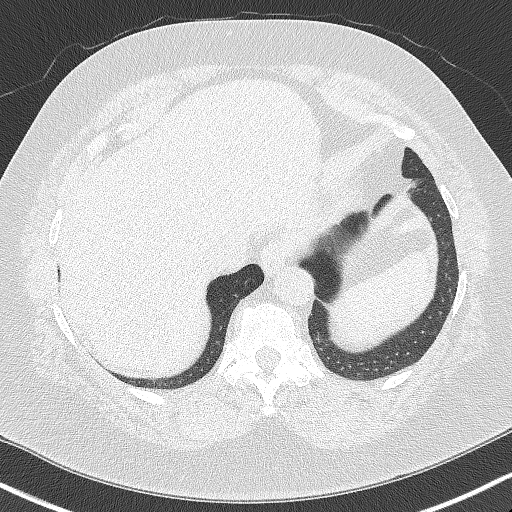
[im 139/348  mediastinal]
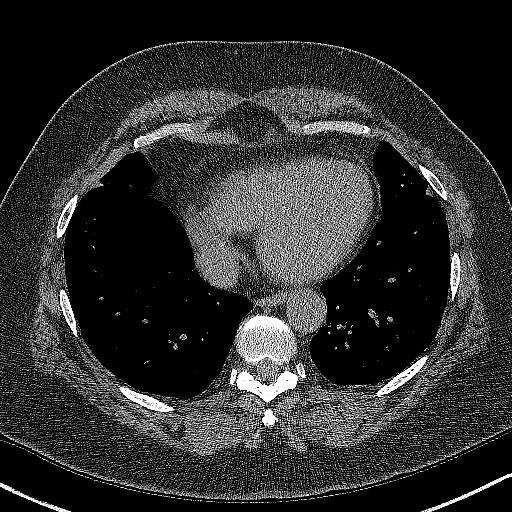
[im 139/348  lung]
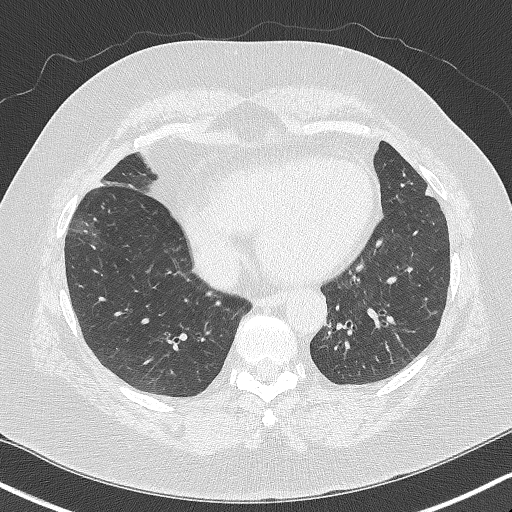
[im 157/348  lung]
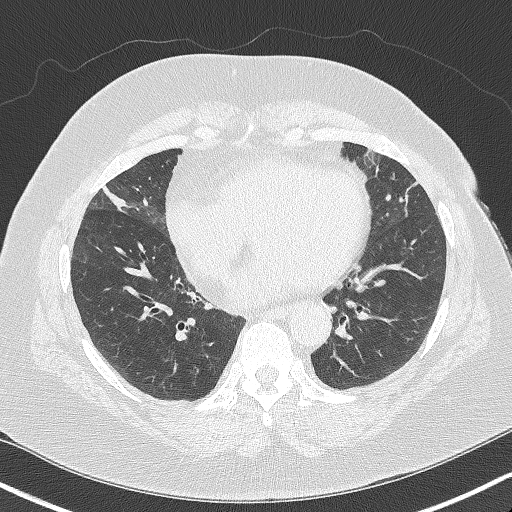
[im 191/348  lung]
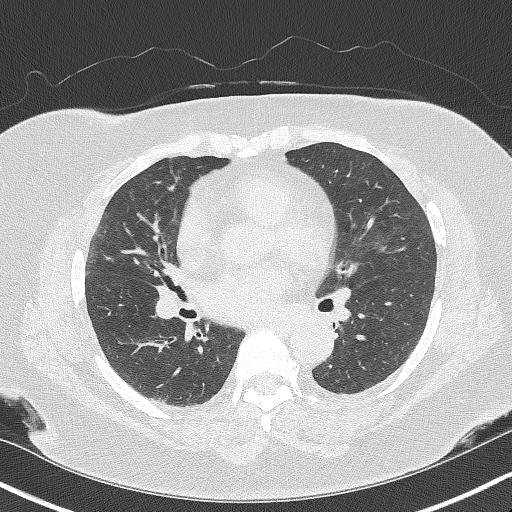
[im 209/348  lung]
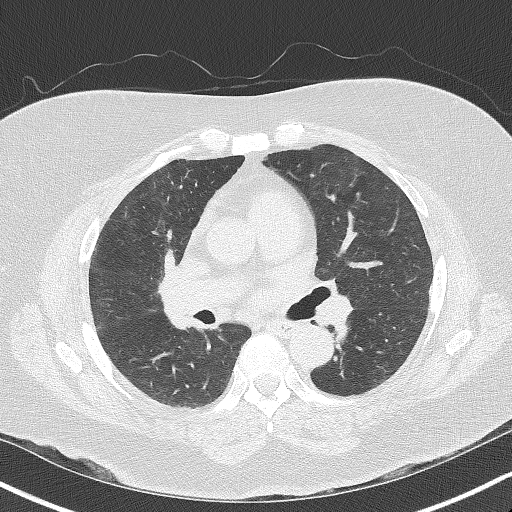
[im 243/348  mediastinal]
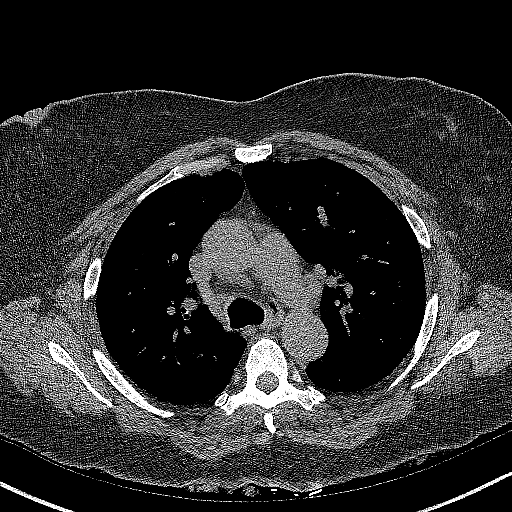
[im 243/348  lung]
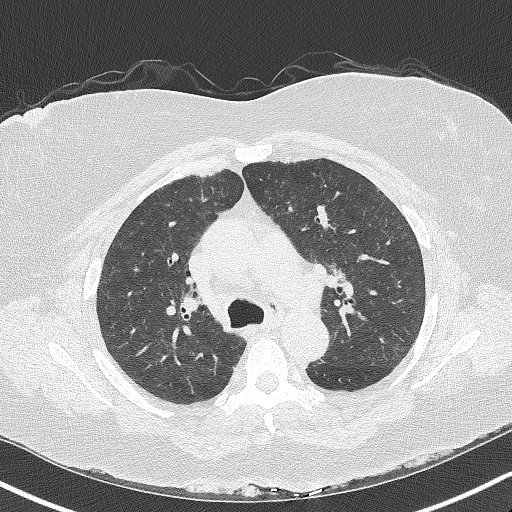
[im 278/348  lung]
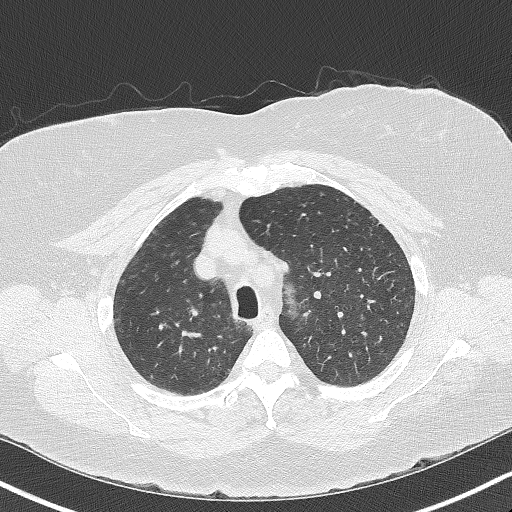
[im 295/348  lung]
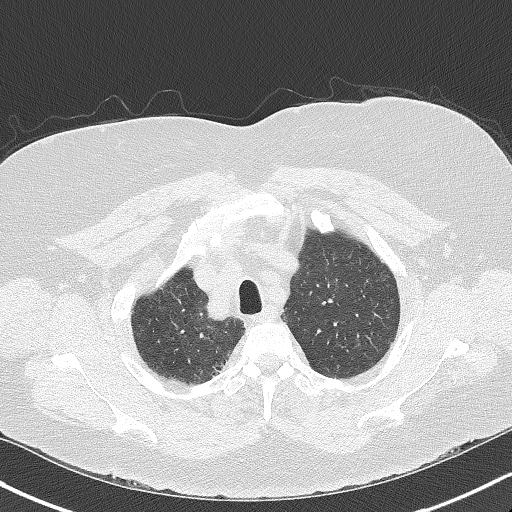
[im 330/348  lung]
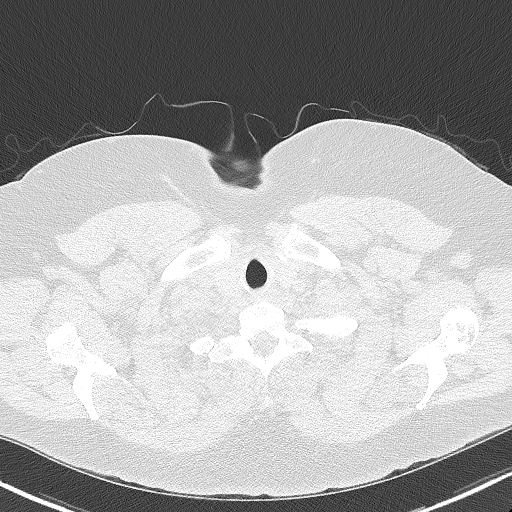

[Series 4: coronal lcs f/u · coronal · 0.66mm/px · 3 of 301 slices shown]
[im 61/301  lung]
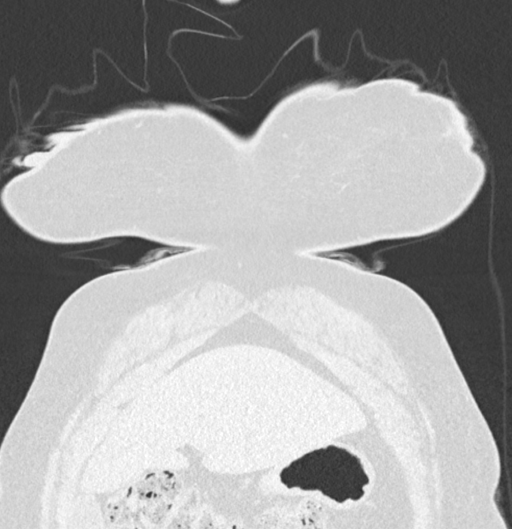
[im 121/301  lung]
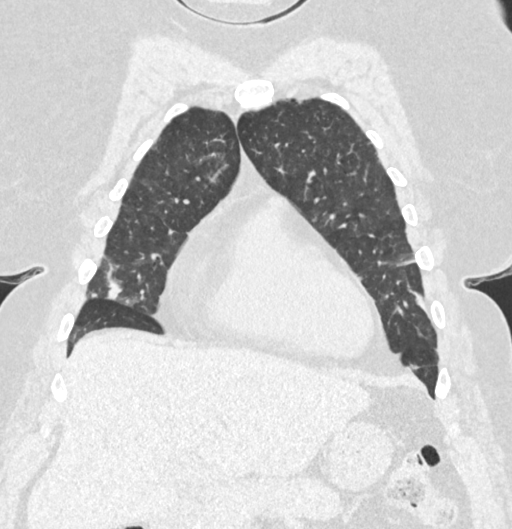
[im 181/301  lung]
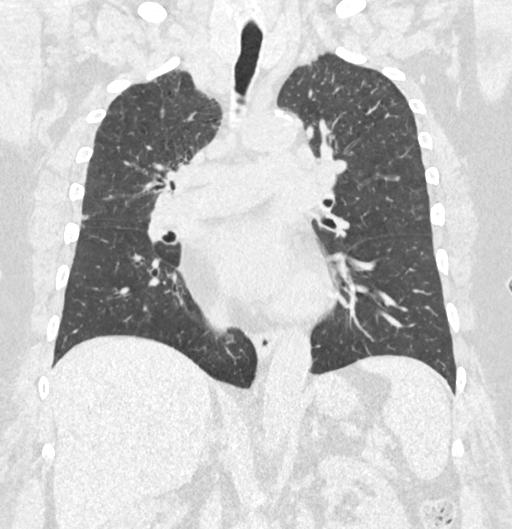

[15 of 40 positions shown; findings below may reference images not displayed]

FINDINGS: Cardiovascular: Heart is normal in size.  No pericardial effusion.

No evidence thoracic aortic aneurysm. Atherosclerotic calcifications
of the aortic arch.

Mediastinum/Nodes: Small mediastinal lymph nodes which do not meet
pathologic CT size criteria.

Visualized thyroid is unremarkable.

Lungs/Pleura: Mild paraseptal emphysematous changes in the lung
apices.

Scattered mild patchy/ground-glass opacity in the lungs bilaterally,
most prominently inferiorly in the right middle lobe and lingula.
This appearance is nonspecific but suggests mild infection,
including atypical mycobacterial infection and viral etiologies.

Prior dominant peribronchovascular nodularity in the left lower lobe
is no longer visualized, likely infectious/inflammatory. Additional
scattered small bilateral pulmonary nodules measuring up to 4.7 mm
in the anterior left upper lobe on the prior study are unchanged.
New scattered small pulmonary nodules measuring up to 3.9 mm,
possibly infectious, although indeterminate.

No pleural effusion or pneumothorax.

Upper Abdomen: Visualized upper abdomen is notable for a nodular
hepatic contour and vascular calcifications.

Musculoskeletal: Mild degenerative changes of the visualized
thoracolumbar spine.
IMPRESSION: Lung-RADS 0, incomplete. Additional lung cancer screening CT
images/or comparison to prior chest CT examinations is needed.

New multifocal patchy/ground-glass opacities in the lungs
bilaterally, most prominently in the right middle lobe and lingula.
Additional scattered small pulmonary nodules measuring up to 3.9 mm,
possibly infectious, although indeterminate. These findings favor
mild infection, including atypical mycobacterial and viral
etiologies.

Consider repeat low-dose lung cancer screening CT chest in 3 months
after appropriate antimicrobial therapy, with testing for possible
viral etiologies as clinically warranted.

These results will be called to the ordering clinician or
representative by the Radiologist Assistant, and communication
documented in the PACS or zVision Dashboard.

Aortic Atherosclerosis (H8FOH-KW1.1) and Emphysema (H8FOH-684.K).

## 2020-01-17 ENCOUNTER — Other Ambulatory Visit (HOSPITAL_COMMUNITY): Payer: Self-pay | Admitting: Orthopedic Surgery

## 2020-01-17 ENCOUNTER — Other Ambulatory Visit: Payer: Self-pay | Admitting: Orthopedic Surgery

## 2020-01-17 DIAGNOSIS — M25511 Pain in right shoulder: Secondary | ICD-10-CM

## 2020-01-17 DIAGNOSIS — M25311 Other instability, right shoulder: Secondary | ICD-10-CM

## 2020-01-17 DIAGNOSIS — M19011 Primary osteoarthritis, right shoulder: Secondary | ICD-10-CM

## 2020-01-30 DIAGNOSIS — F411 Generalized anxiety disorder: Secondary | ICD-10-CM | POA: Insufficient documentation

## 2020-01-30 DIAGNOSIS — I951 Orthostatic hypotension: Secondary | ICD-10-CM | POA: Insufficient documentation

## 2020-01-30 DIAGNOSIS — R0602 Shortness of breath: Secondary | ICD-10-CM | POA: Insufficient documentation

## 2020-02-09 ENCOUNTER — Ambulatory Visit
Admission: RE | Admit: 2020-02-09 | Discharge: 2020-02-09 | Disposition: A | Discharge status: Home / Self Care | Payer: Medicare Other | Source: Ambulatory Visit | Attending: Orthopedic Surgery | Admitting: Orthopedic Surgery

## 2020-02-09 ENCOUNTER — Other Ambulatory Visit: Payer: Self-pay

## 2020-02-09 DIAGNOSIS — M25311 Other instability, right shoulder: Secondary | ICD-10-CM

## 2020-02-09 DIAGNOSIS — M19011 Primary osteoarthritis, right shoulder: Secondary | ICD-10-CM | POA: Diagnosis present

## 2020-02-09 DIAGNOSIS — G8929 Other chronic pain: Secondary | ICD-10-CM

## 2020-02-09 DIAGNOSIS — M25511 Pain in right shoulder: Secondary | ICD-10-CM | POA: Diagnosis not present

## 2020-02-29 ENCOUNTER — Other Ambulatory Visit: Payer: Self-pay | Admitting: *Deleted

## 2020-02-29 DIAGNOSIS — Z122 Encounter for screening for malignant neoplasm of respiratory organs: Secondary | ICD-10-CM

## 2020-02-29 DIAGNOSIS — Z87891 Personal history of nicotine dependence: Secondary | ICD-10-CM

## 2020-02-29 NOTE — Progress Notes (Signed)
Current smoker, 49 pack year

## 2020-03-09 ENCOUNTER — Other Ambulatory Visit: Payer: Self-pay

## 2020-03-09 ENCOUNTER — Ambulatory Visit
Admission: RE | Admit: 2020-03-09 | Discharge: 2020-03-09 | Disposition: A | Discharge status: Home / Self Care | Payer: Medicare Other | Source: Ambulatory Visit | Attending: Nurse Practitioner | Admitting: Nurse Practitioner

## 2020-03-09 DIAGNOSIS — Z87891 Personal history of nicotine dependence: Secondary | ICD-10-CM | POA: Diagnosis present

## 2020-03-09 DIAGNOSIS — Z122 Encounter for screening for malignant neoplasm of respiratory organs: Secondary | ICD-10-CM

## 2020-03-15 ENCOUNTER — Encounter: Payer: Self-pay | Admitting: *Deleted

## 2020-03-16 ENCOUNTER — Other Ambulatory Visit: Payer: Self-pay | Admitting: Oncology

## 2020-03-16 NOTE — Telephone Encounter (Signed)
Patient has not been seen since 05/02/2019 by Dr. Grayland Ormond, was advised to come back to clinic in 6 months for labs and video. Patient did come for labs but video visit was canceled. Patient has no upcoming appointments scheduled with provider. Please advise.

## 2020-06-05 ENCOUNTER — Emergency Department: Payer: Medicare Other

## 2020-06-05 ENCOUNTER — Other Ambulatory Visit: Payer: Self-pay

## 2020-06-05 ENCOUNTER — Inpatient Hospital Stay
Admission: EM | Admit: 2020-06-05 | Discharge: 2020-06-07 | DRG: 177 | Discharge status: Against Medical Advice | Payer: Medicare Other | Attending: Hospitalist | Admitting: Hospitalist

## 2020-06-05 DIAGNOSIS — Z8249 Family history of ischemic heart disease and other diseases of the circulatory system: Secondary | ICD-10-CM

## 2020-06-05 DIAGNOSIS — U071 COVID-19: Principal | ICD-10-CM | POA: Diagnosis present

## 2020-06-05 DIAGNOSIS — D649 Anemia, unspecified: Secondary | ICD-10-CM | POA: Diagnosis present

## 2020-06-05 DIAGNOSIS — Z833 Family history of diabetes mellitus: Secondary | ICD-10-CM

## 2020-06-05 DIAGNOSIS — Z79899 Other long term (current) drug therapy: Secondary | ICD-10-CM

## 2020-06-05 DIAGNOSIS — I272 Pulmonary hypertension, unspecified: Secondary | ICD-10-CM | POA: Diagnosis present

## 2020-06-05 DIAGNOSIS — Z7951 Long term (current) use of inhaled steroids: Secondary | ICD-10-CM

## 2020-06-05 DIAGNOSIS — K219 Gastro-esophageal reflux disease without esophagitis: Secondary | ICD-10-CM | POA: Diagnosis present

## 2020-06-05 DIAGNOSIS — F209 Schizophrenia, unspecified: Secondary | ICD-10-CM | POA: Diagnosis present

## 2020-06-05 DIAGNOSIS — Z794 Long term (current) use of insulin: Secondary | ICD-10-CM

## 2020-06-05 DIAGNOSIS — G4730 Sleep apnea, unspecified: Secondary | ICD-10-CM | POA: Diagnosis present

## 2020-06-05 DIAGNOSIS — I10 Essential (primary) hypertension: Secondary | ICD-10-CM | POA: Diagnosis present

## 2020-06-05 DIAGNOSIS — E1142 Type 2 diabetes mellitus with diabetic polyneuropathy: Secondary | ICD-10-CM | POA: Diagnosis present

## 2020-06-05 DIAGNOSIS — Z886 Allergy status to analgesic agent status: Secondary | ICD-10-CM | POA: Diagnosis not present

## 2020-06-05 DIAGNOSIS — Z9981 Dependence on supplemental oxygen: Secondary | ICD-10-CM | POA: Diagnosis not present

## 2020-06-05 DIAGNOSIS — F17210 Nicotine dependence, cigarettes, uncomplicated: Secondary | ICD-10-CM | POA: Diagnosis present

## 2020-06-05 DIAGNOSIS — J9601 Acute respiratory failure with hypoxia: Secondary | ICD-10-CM | POA: Diagnosis present

## 2020-06-05 DIAGNOSIS — E1165 Type 2 diabetes mellitus with hyperglycemia: Secondary | ICD-10-CM | POA: Diagnosis present

## 2020-06-05 DIAGNOSIS — J9621 Acute and chronic respiratory failure with hypoxia: Secondary | ICD-10-CM | POA: Diagnosis present

## 2020-06-05 DIAGNOSIS — Z96642 Presence of left artificial hip joint: Secondary | ICD-10-CM | POA: Diagnosis present

## 2020-06-05 DIAGNOSIS — J1282 Pneumonia due to coronavirus disease 2019: Secondary | ICD-10-CM | POA: Diagnosis present

## 2020-06-05 DIAGNOSIS — J441 Chronic obstructive pulmonary disease with (acute) exacerbation: Secondary | ICD-10-CM | POA: Diagnosis present

## 2020-06-05 DIAGNOSIS — Z91041 Radiographic dye allergy status: Secondary | ICD-10-CM | POA: Diagnosis not present

## 2020-06-05 DIAGNOSIS — Z888 Allergy status to other drugs, medicaments and biological substances status: Secondary | ICD-10-CM

## 2020-06-05 DIAGNOSIS — Z791 Long term (current) use of non-steroidal anti-inflammatories (NSAID): Secondary | ICD-10-CM

## 2020-06-05 DIAGNOSIS — J1108 Influenza due to unidentified influenza virus with specified pneumonia: Secondary | ICD-10-CM | POA: Diagnosis present

## 2020-06-05 DIAGNOSIS — Z880 Allergy status to penicillin: Secondary | ICD-10-CM | POA: Diagnosis not present

## 2020-06-05 DIAGNOSIS — J440 Chronic obstructive pulmonary disease with acute lower respiratory infection: Secondary | ICD-10-CM | POA: Diagnosis present

## 2020-06-05 LAB — CBC WITH DIFFERENTIAL/PLATELET
Abs Immature Granulocytes: 0.04 10*3/uL (ref 0.00–0.07)
Basophils Absolute: 0.1 10*3/uL (ref 0.0–0.1)
Basophils Relative: 1 %
Eosinophils Absolute: 0.1 10*3/uL (ref 0.0–0.5)
Eosinophils Relative: 1 %
HCT: 34.1 % — ABNORMAL LOW (ref 36.0–46.0)
Hemoglobin: 10.9 g/dL — ABNORMAL LOW (ref 12.0–15.0)
Immature Granulocytes: 1 %
Lymphocytes Relative: 20 %
Lymphs Abs: 1.7 10*3/uL (ref 0.7–4.0)
MCH: 28.3 pg (ref 26.0–34.0)
MCHC: 32 g/dL (ref 30.0–36.0)
MCV: 88.6 fL (ref 80.0–100.0)
Monocytes Absolute: 0.7 10*3/uL (ref 0.1–1.0)
Monocytes Relative: 9 %
Neutro Abs: 5.8 10*3/uL (ref 1.7–7.7)
Neutrophils Relative %: 68 %
Platelets: 273 10*3/uL (ref 150–400)
RBC: 3.85 MIL/uL — ABNORMAL LOW (ref 3.87–5.11)
RDW: 15.3 % (ref 11.5–15.5)
WBC: 8.4 10*3/uL (ref 4.0–10.5)
nRBC: 0 % (ref 0.0–0.2)

## 2020-06-05 LAB — PROCALCITONIN: Procalcitonin: <0.1 ng/mL

## 2020-06-05 LAB — COMPREHENSIVE METABOLIC PANEL
ALT: 13 U/L (ref 0–44)
AST: 12 U/L — ABNORMAL LOW (ref 15–41)
Albumin: 2.5 g/dL — ABNORMAL LOW (ref 3.5–5.0)
Alkaline Phosphatase: 62 U/L (ref 38–126)
Anion gap: 10 (ref 5–15)
BUN: 15 mg/dL (ref 8–23)
CO2: 26 mmol/L (ref 22–32)
Calcium: 8.2 mg/dL — ABNORMAL LOW (ref 8.9–10.3)
Chloride: 104 mmol/L (ref 98–111)
Creatinine, Ser: 1.02 mg/dL — ABNORMAL HIGH (ref 0.44–1.00)
GFR, Estimated: >60 mL/min (ref >60)
Glucose, Bld: 138 mg/dL — ABNORMAL HIGH (ref 70–99)
Potassium: 4.4 mmol/L (ref 3.5–5.1)
Sodium: 140 mmol/L (ref 135–145)
Total Bilirubin: 0.7 mg/dL (ref 0.3–1.2)
Total Protein: 6.7 g/dL (ref 6.5–8.1)

## 2020-06-05 LAB — HIV ANTIBODY (ROUTINE TESTING W REFLEX): HIV Screen 4th Generation wRfx: NONREACTIVE

## 2020-06-05 LAB — TROPONIN I (HIGH SENSITIVITY)
Troponin I (High Sensitivity): 4 ng/L (ref <18)
Troponin I (High Sensitivity): 4 ng/L (ref <18)

## 2020-06-05 LAB — BRAIN NATRIURETIC PEPTIDE: B Natriuretic Peptide: 39.1 pg/mL (ref 0.0–100.0)

## 2020-06-05 MED ORDER — TRAZODONE HCL 50 MG PO TABS
25.0000 mg | ORAL_TABLET | Freq: Every evening | ORAL | Status: DC | PRN
  Administered 2020-06-05 – 2020-06-06 (×2): 25 mg via ORAL
  Filled 2020-06-05 (×2): qty 1

## 2020-06-05 MED ORDER — PROMETHAZINE HCL 25 MG PO TABS
25.0000 mg | ORAL_TABLET | ORAL | Status: DC | PRN
  Filled 2020-06-05: qty 1

## 2020-06-05 MED ORDER — PREDNISONE 50 MG PO TABS: 50.0000 mg | ORAL_TABLET | Freq: Every day | ORAL | Status: DC

## 2020-06-05 MED ORDER — DILTIAZEM HCL ER COATED BEADS 180 MG PO CP24
180.0000 mg | ORAL_CAPSULE | Freq: Every day | ORAL | Status: DC
Start: 2020-06-05 — End: 2020-06-07
  Administered 2020-06-05 – 2020-06-06 (×3): 180 mg via ORAL
  Filled 2020-06-05 (×3): qty 1

## 2020-06-05 MED ORDER — PANTOPRAZOLE SODIUM 40 MG PO TBEC
40.0000 mg | DELAYED_RELEASE_TABLET | Freq: Every day | ORAL | Status: DC
  Administered 2020-06-05 – 2020-06-06 (×2): 40 mg via ORAL
  Filled 2020-06-05 (×2): qty 1

## 2020-06-05 MED ORDER — INSULIN GLARGINE 100 UNIT/ML ~~LOC~~ SOLN
10.0000 [IU] | Freq: Every day | SUBCUTANEOUS | Status: DC
  Filled 2020-06-05 (×2): qty 0.1

## 2020-06-05 MED ORDER — HYDROCOD POLST-CPM POLST ER 10-8 MG/5ML PO SUER: 5.0000 mL | Freq: Two times a day (BID) | ORAL | Status: DC | PRN

## 2020-06-05 MED ORDER — GUAIFENESIN-DM 100-10 MG/5ML PO SYRP: 10.0000 mL | ORAL_SOLUTION | ORAL | Status: DC | PRN

## 2020-06-05 MED ORDER — ONDANSETRON HCL 4 MG/2ML IJ SOLN: 4.0000 mg | Freq: Four times a day (QID) | INTRAMUSCULAR | Status: DC | PRN

## 2020-06-05 MED ORDER — METOPROLOL SUCCINATE ER 25 MG PO TB24
25.0000 mg | ORAL_TABLET | Freq: Every day | ORAL | Status: DC
  Administered 2020-06-06: 10:00:00 25 mg via ORAL
  Filled 2020-06-05: qty 1

## 2020-06-05 MED ORDER — ESCITALOPRAM OXALATE 10 MG PO TABS
10.0000 mg | ORAL_TABLET | Freq: Every day | ORAL | Status: DC
  Filled 2020-06-05 (×2): qty 1

## 2020-06-05 MED ORDER — FAMOTIDINE 20 MG PO TABS
20.0000 mg | ORAL_TABLET | Freq: Two times a day (BID) | ORAL | Status: DC
  Administered 2020-06-05 – 2020-06-06 (×5): 20 mg via ORAL
  Filled 2020-06-05 (×5): qty 1

## 2020-06-05 MED ORDER — LOSARTAN POTASSIUM 50 MG PO TABS
50.0000 mg | ORAL_TABLET | Freq: Every day | ORAL | Status: DC
  Administered 2020-06-05 – 2020-06-06 (×2): 50 mg via ORAL
  Filled 2020-06-05 (×2): qty 1

## 2020-06-05 MED ORDER — MAGNESIUM HYDROXIDE 400 MG/5ML PO SUSP
30.0000 mL | Freq: Every day | ORAL | Status: DC | PRN
  Filled 2020-06-05: qty 30

## 2020-06-05 MED ORDER — IPRATROPIUM-ALBUTEROL 0.5-2.5 (3) MG/3ML IN SOLN
3.0000 mL | Freq: Once | RESPIRATORY_TRACT | Status: AC
  Administered 2020-06-05: 3 mL via RESPIRATORY_TRACT
  Filled 2020-06-05: qty 3

## 2020-06-05 MED ORDER — VITAMIN D (ERGOCALCIFEROL) 1.25 MG (50000 UNIT) PO CAPS: 50000.0000 [IU] | ORAL_CAPSULE | ORAL | Status: DC

## 2020-06-05 MED ORDER — NICOTINE 21 MG/24HR TD PT24
21.0000 mg | MEDICATED_PATCH | Freq: Every day | TRANSDERMAL | Status: DC
  Administered 2020-06-05 – 2020-06-06 (×2): 21 mg via TRANSDERMAL
  Filled 2020-06-05 (×2): qty 1

## 2020-06-05 MED ORDER — SODIUM CHLORIDE 0.9 % IV SOLN: INTRAVENOUS | Status: DC

## 2020-06-05 MED ORDER — ACETAMINOPHEN 325 MG PO TABS
650.0000 mg | ORAL_TABLET | Freq: Four times a day (QID) | ORAL | Status: DC | PRN
  Administered 2020-06-05 – 2020-06-06 (×2): 650 mg via ORAL
  Filled 2020-06-05 (×2): qty 2

## 2020-06-05 MED ORDER — ASCORBIC ACID 500 MG PO TABS
500.0000 mg | ORAL_TABLET | Freq: Every day | ORAL | Status: DC
  Administered 2020-06-05 – 2020-06-06 (×2): 500 mg via ORAL
  Filled 2020-06-05 (×2): qty 1

## 2020-06-05 MED ORDER — SODIUM CHLORIDE 0.9 % IV SOLN
100.0000 mg | Freq: Every day | INTRAVENOUS | Status: DC
  Administered 2020-06-06: 100 mg via INTRAVENOUS
  Filled 2020-06-05: qty 20

## 2020-06-05 MED ORDER — SODIUM CHLORIDE 0.9 % IV SOLN
200.0000 mg | Freq: Once | INTRAVENOUS | Status: AC
  Administered 2020-06-05: 200 mg via INTRAVENOUS
  Filled 2020-06-05: qty 200

## 2020-06-05 MED ORDER — DILTIAZEM HCL ER COATED BEADS 180 MG PO CP24
180.0000 mg | ORAL_CAPSULE | Freq: Every day | ORAL | Status: DC
  Filled 2020-06-05: qty 1

## 2020-06-05 MED ORDER — GUAIFENESIN ER 600 MG PO TB12
600.0000 mg | ORAL_TABLET | Freq: Two times a day (BID) | ORAL | Status: DC
  Administered 2020-06-05 – 2020-06-06 (×4): 600 mg via ORAL
  Filled 2020-06-05 (×5): qty 1

## 2020-06-05 MED ORDER — ZINC SULFATE 220 (50 ZN) MG PO CAPS
220.0000 mg | ORAL_CAPSULE | Freq: Every day | ORAL | Status: DC
  Administered 2020-06-05 – 2020-06-06 (×2): 220 mg via ORAL
  Filled 2020-06-05 (×2): qty 1

## 2020-06-05 MED ORDER — ENOXAPARIN SODIUM 60 MG/0.6ML ~~LOC~~ SOLN
50.0000 mg | SUBCUTANEOUS | Status: DC
  Administered 2020-06-05 – 2020-06-06 (×2): 50 mg via SUBCUTANEOUS
  Filled 2020-06-05 (×3): qty 0.6

## 2020-06-05 MED ORDER — VITAMIN D 25 MCG (1000 UNIT) PO TABS
1000.0000 [IU] | ORAL_TABLET | Freq: Every day | ORAL | Status: DC
  Administered 2020-06-05 – 2020-06-06 (×2): 1000 [IU] via ORAL
  Filled 2020-06-05 (×2): qty 1

## 2020-06-05 MED ORDER — ONDANSETRON HCL 4 MG PO TABS: 4.0000 mg | ORAL_TABLET | Freq: Four times a day (QID) | ORAL | Status: DC | PRN

## 2020-06-05 MED ORDER — OXYCODONE-ACETAMINOPHEN 7.5-325 MG PO TABS
1.0000 | ORAL_TABLET | Freq: Four times a day (QID) | ORAL | Status: DC | PRN
  Administered 2020-06-05: 1 via ORAL
  Administered 2020-06-06: 2 via ORAL
  Filled 2020-06-05: qty 1
  Filled 2020-06-05: qty 2

## 2020-06-05 MED ORDER — HYDROCHLOROTHIAZIDE 25 MG PO TABS
25.0000 mg | ORAL_TABLET | Freq: Every morning | ORAL | Status: DC
  Administered 2020-06-05 – 2020-06-06 (×2): 25 mg via ORAL
  Filled 2020-06-05 (×2): qty 1

## 2020-06-05 MED ORDER — PREGABALIN 25 MG PO CAPS
25.0000 mg | ORAL_CAPSULE | Freq: Four times a day (QID) | ORAL | Status: DC
  Administered 2020-06-05 – 2020-06-06 (×4): 25 mg via ORAL
  Filled 2020-06-05 (×5): qty 1

## 2020-06-05 MED ORDER — NITROGLYCERIN 0.4 MG SL SUBL: 0.4000 mg | SUBLINGUAL_TABLET | SUBLINGUAL | Status: DC | PRN

## 2020-06-05 MED ORDER — METHYLPREDNISOLONE SODIUM SUCC 125 MG IJ SOLR
1.0000 mg/kg | Freq: Two times a day (BID) | INTRAMUSCULAR | Status: DC
  Administered 2020-06-05 – 2020-06-07 (×5): 101.25 mg via INTRAVENOUS
  Filled 2020-06-05 (×5): qty 2

## 2020-06-05 NOTE — ED Notes (Signed)
Pharmacy contacted requesting they send Remdesivir for RN to administer.

## 2020-06-05 NOTE — H&P (Signed)
[place]   PATIENT NAME: Deanna Gay    MR#:  [id number]  DATE OF BIRTH:  11/02/1955  DATE OF ADMISSION:  06/05/2020  PRIMARY CARE PHYSICIAN: Kirk Ruths, MD   REQUESTING/REFERRING PHYSICIAN: Rudene Re, MD.  CHIEF COMPLAINT:   Chief Complaint  Patient presents with  . Respiratory Distress    HISTORY OF PRESENT ILLNESS:  Deanna Gay  is a 65 y.o. African-American female with a known history of hypertension, type 2 diabetes mellitus, COPD, chronic respiratory failure on home O2 at 2 L/min, schizophrenia and sleep apnea, who presented to the emergency room with acute onset of worsening dyspnea with desaturating to the 60s on her home O2.  She admitted to dry cough.  She has been having nausea without vomiting or diarrhea.  No fever or chills.  No loss of taste or smell.  She admitted to generalized weakness as well as muscle aches all over.  She has received 2 doses of COVID-19 vaccines and was getting ready to get her booster.  The patient was diagnosed with COVID-19 on 05/22/2020. Upon arrival of EMS pulse oximetry was 88% on 2 L of O2 by nasal cannula.  She was having bilateral wheezing.  She was given 2 DuoNeb as well as 125 mg of IV Solu-Medrol.. She later needed increased amount of oxygen to 4 L to maintain adequate pulse oximetry. Pulse oximetry was initially 88% on room air.  When he came to the ER, vital signs were within normal and pulse emergency was up to 97% on 4 L of O2 by nasal cannula. Labs revealed unremarkable CMP. High-sensitivity troponin I was 4 and later the same. BNP was 39.1. Procalcitonin was less than 0.1. CBC showed anemia. Chest x-ray showed cardiomegaly with bilateral hazy focal infiltrates consistent with multifocal pneumonia.EKG showed normal sinus rhythm with rate of 98 with slightly poor R wave progression.  The patient was given 3 duo nebs and was started on IV remdesivir.  She will be admitted to a medical monitored  isolation bed for further evaluation and management. PAST MEDICAL HISTORY:   Past Medical History:  Diagnosis Date  . Anemia   . Arthritis   . Cancer ([place])    cervical  . COPD (chronic obstructive pulmonary disease) ([place])   . COPD exacerbation ([place]) 07/19/2017  . Diabetes mellitus without complication ([place])   . GERD (gastroesophageal reflux disease)   . Hypertension   . Schizophrenia ([place])   . Sleep apnea     PAST SURGICAL HISTORY:   Past Surgical History:  Procedure Laterality Date  . APPENDECTOMY    . COLONOSCOPY WITH PROPOFOL N/A 12/25/2014   Procedure: COLONOSCOPY WITH PROPOFOL;  Surgeon: Josefine Class, MD;  Location: [facility] ENDOSCOPY;  Service: Endoscopy;  Laterality: N/A;  . COLONOSCOPY WITH PROPOFOL N/A 01/21/2018   Procedure: COLONOSCOPY WITH PROPOFOL;  Surgeon: Lin Landsman, MD;  Location: [facility] ENDOSCOPY;  Service: Gastroenterology;  Laterality: N/A;  . ESOPHAGOGASTRODUODENOSCOPY (EGD) WITH PROPOFOL N/A 12/25/2014   Procedure: ESOPHAGOGASTRODUODENOSCOPY (EGD) WITH PROPOFOL;  Surgeon: Josefine Class, MD;  Location: [facility] ENDOSCOPY;  Service: Endoscopy;  Laterality: N/A;  . ESOPHAGOGASTRODUODENOSCOPY (EGD) WITH PROPOFOL N/A 01/21/2018   Procedure: ESOPHAGOGASTRODUODENOSCOPY (EGD) WITH PROPOFOL;  Surgeon: Lin Landsman, MD;  Location: [facility] ENDOSCOPY;  Service: Gastroenterology;  Laterality: N/A;  . GIVENS CAPSULE STUDY N/A 03/02/2018   Procedure: GIVENS CAPSULE STUDY;  Surgeon: Lin Landsman, MD;  Location: [facility] ENDOSCOPY;  Service: Gastroenterology;  Laterality: N/A;  . JOINT  REPLACEMENT     right hip  . TOTAL HIP ARTHROPLASTY Left 05/16/2016   Procedure: LEFT TOTAL HIP ARTHROPLASTY ANTERIOR APPROACH;  Surgeon: Mcarthur Rossetti, MD;  Location: [facility] ORS;  Service: Orthopedics;  Laterality: Left;    SOCIAL HISTORY:   Social History   Tobacco Use  . Smoking status: Current Every Day Smoker    Packs/day: 1.00    Years: 48.00    Pack years: 48.00     Types: Cigarettes  . Smokeless tobacco: Never Used  Substance Use Topics  . Alcohol use: No    FAMILY HISTORY:   Family History  Problem Relation Age of Onset  . Hypertension Mother   . Diabetes Mellitus II Mother   . Breast cancer Neg Hx   . Kidney cancer Neg Hx   . Bladder Cancer Neg Hx     DRUG ALLERGIES:   Allergies  Allergen Reactions  . Penicillins Swelling    Has patient had a PCN reaction causing immediate rash, facial/tongue/throat swelling, SOB or lightheadedness with hypotension: Yes Has patient had a PCN reaction causing severe rash involving mucus membranes or skin necrosis: Yes Has patient had a PCN reaction that required hospitalization: No Has patient had a PCN reaction occurring within the last 10 years: No If all of the above answers are "NO", then may proceed with Cephalosporin use.  Has patient had a PCN reaction causing immediate rash, facial/tongue/throat swelling, SOB or lightheadedness with hypotension: Yes Has patient had a PCN reaction causing severe rash involving mucus membranes or skin necrosis: Yes Has patient had a PCN reaction that required hospitalization: No Has patient had a PCN reaction occurring within the last 10 years: No If all of the above answers are "NO", then may proceed with Cephalosporin use. Has patient had a PCN reaction causing immediate rash, faci  . Aspirin Other (See Comments)    Ongoing anemia  . Iodinated Diagnostic Agents Swelling  . Iodine Swelling    REVIEW OF SYSTEMS:   ROS As per history of present illness. All pertinent systems were reviewed above. Constitutional, HEENT, cardiovascular, respiratory, GI, GU, musculoskeletal, neuro, psychiatric, endocrine, integumentary and hematologic systems were reviewed and are otherwise negative/unremarkable except for positive findings mentioned above in the HPI.   MEDICATIONS AT HOME:   Prior to Admission medications   Medication Sig Start Date End Date Taking?  Authorizing Provider  ACCU-CHEK AVIVA PLUS test strip use 1 TEST STRIP to TEST BLOOD SUGAR four times a day 12/17/17   [provider]  albuterol (PROVENTIL HFA;VENTOLIN HFA) 108 (90 BASE) MCG/ACT inhaler Inhale 2 puffs into the lungs every 4 (four) hours as needed for wheezing or shortness of breath.     [provider]  Blood Glucose Monitoring Suppl (ACCU-CHEK AVIVA PLUS) w/Device KIT See admin instructions. 12/19/17   [provider]  budesonide-formoterol (SYMBICORT) 160-4.5 MCG/ACT inhaler Inhale 2 puffs into the lungs 2 (two) times daily.    [provider]  diclofenac sodium (VOLTAREN) 1 % GEL APP 2 GRAMS EXT AA QID 03/17/18   [provider]  diltiazem (CARDIZEM CD) 180 MG 24 hr capsule Take 180 mg by mouth at bedtime.     [provider]  FERREX 150 150 MG capsule TAKE 1 CAPSULE BY MOUTH TWICE A DAY 08/03/19   Lloyd Huger, MD  folic acid (FOLVITE) 1 MG tablet TAKE 1 TABLET(1 MG) BY MOUTH DAILY 02/01/18   Vanga, Tally Due, MD  hydrochlorothiazide (HYDRODIURIL) 25 MG tablet take 1  tablet by mouth every morning 01/27/13   [provider]  insulin glargine (LANTUS) 100 UNIT/ML injection Inject 0.1 mLs (10 Units total) into the skin at bedtime. 11/19/17   Bettey Costa, MD  insulin lispro (HUMALOG) 100 UNIT/ML injection Please check your sugars before each meal and use lispro per the below sliding scale:  For sugars 151-200: take 2 units For 201-250: take 4units For 251-300: take 6 units For 301-350: take 8 units For 351-400: take 10 units For > 401: call MD 07/20/17   Gladstone Lighter, MD  Insulin Syringe-Needle U-100 (BD INSULIN SYRINGE ULTRAFINE) 31G X 5/16" 0.5 ML MISC use as directed once daily 06/25/17   [provider]  ipratropium-albuterol (DUONEB) 0.5-2.5 (3) MG/3ML SOLN Take 3 mLs by nebulization every 6 (six) hours as needed.    [provider]  methylPREDNISolone (MEDROL DOSEPAK) 4 MG TBPK tablet  Take Tapered dose as directed 02/19/19   Sable Feil, PA-C  nitroGLYCERIN (NITROSTAT) 0.4 MG SL tablet Place 1 tablet under the tongue every 5 (five) minutes as needed. 08/17/17   [provider]  omeprazole (PRILOSEC) 20 MG capsule Take 20 mg by mouth 2 (two) times daily. 05/26/18   [provider]  oxyCODONE-acetaminophen (PERCOCET) 7.5-325 MG tablet Take 1-2 tablets by mouth every 6 (six) hours as needed for severe pain. 05/18/16   Mcarthur Rossetti, MD  predniSONE (DELTASONE) 10 MG tablet Label  & dispense according to the schedule below. 5 Pills PO for 1 day then, 4 Pills PO for 1 day, 3 Pills PO for 1 day, 2 Pills PO for 1 day, 1 Pill PO for 1 days then STOP. 05/16/18   Sainani, Belia Heman, MD  promethazine (PHENERGAN) 25 MG tablet Take 25 mg by mouth every 4 (four) hours as needed.  04/05/18   [provider]  triamcinolone (KENALOG) 0.1 % paste APPLY TO AREA OF IRRITATION TID PRN FOR PAIN AND SWELLING 07/12/18   [provider]  triamcinolone cream (KENALOG) 0.1 % Apply 1 application topically 2 (two) times daily.    [provider]  Vitamin D, Ergocalciferol, (DRISDOL) 1.25 MG (50000 UT) CAPS capsule Take 1 capsule (50,000 Units total) by mouth once a week. For 12 weeks 08/04/18   Lin Landsman, MD      VITAL SIGNS:  Blood pressure (!) 153/76, pulse (!) 107, temperature 98.2 F (36.8 C), temperature source Oral, resp. rate (!) 24, height 5' 4"  (1.626 m), weight 101.2 kg, SpO2 94 %.  PHYSICAL EXAMINATION:  Physical Exam  GENERAL:  65 y.o.-year-old African-American female patient lying in the bed with mild respiratory distress with conversational dyspnea.   EYES: Pupils equal, round, reactive to light and accommodation. No scleral icterus. Extraocular muscles intact.  HEENT: Head atraumatic, normocephalic. Oropharynx and nasopharynx clear.  NECK:  Supple, no jugular venous distention. No thyroid enlargement, no tenderness.  LUNGS:  Diminished bibasal breath sounds with bibasal crackles.   CARDIOVASCULAR: Regular rate and rhythm, S1, S2 normal. No murmurs, rubs, or gallops.  ABDOMEN: Soft, nondistended, nontender. Bowel sounds present. No organomegaly or mass.  EXTREMITIES: No pedal edema, cyanosis, or clubbing.  NEUROLOGIC: Cranial nerves II through XII are intact. Muscle strength 5/5 in all extremities. Sensation intact. Gait not checked.  PSYCHIATRIC: The patient is alert and oriented x 3.  Normal affect and good eye contact. SKIN: No obvious rash, lesion, or ulcer.   LABORATORY PANEL:   CBC Recent Labs  Lab 06/05/20 0018  WBC 8.4  HGB  10.9*  HCT 34.1*  PLT 273   ------------------------------------------------------------------------------------------------------------------  Chemistries  Recent Labs  Lab 06/05/20 0129  NA 140  K 4.4  CL 104  CO2 26  GLUCOSE 138*  BUN 15  CREATININE 1.02*  CALCIUM 8.2*  AST 12*  ALT 13  ALKPHOS 62  BILITOT 0.7   ------------------------------------------------------------------------------------------------------------------  Cardiac Enzymes No results for input(s): TROPONINI in the last 168 hours. ------------------------------------------------------------------------------------------------------------------  RADIOLOGY:  DG Chest Portable 1 View  Result Date: 06/05/2020 CLINICAL DATA:  Shortness of breath with COVID pneumonia. EXAM: PORTABLE CHEST 1 VIEW COMPARISON:  02/19/2019 FINDINGS: The heart size is significantly enlarged. There is no pneumothorax or large pleural effusion. Bilateral hazy focal infiltrates are noted. The lungs are hyperexpanded. There are probable trace bilateral pleural effusions. Aortic calcifications are noted. IMPRESSION: Cardiomegaly with bilateral hazy focal infiltrates consistent with multifocal pneumonia (viral or bacterial). Electronically Signed   By: Constance Holster M.D.   On: 06/05/2020 00:30      IMPRESSION AND  PLAN:   1.  Acute hypoxemic respiratory failure secondary to COVID-19 on top of chronic respiratory failure. -The patient will be admitted to a medically monitored isolation bed. -O2 protocol will be followed to keep O2 saturation above 93.   2.  Multifocal pneumonia secondary to COVID-19. -The patient will be admitted to an isolation monitored bed with droplet and contact precautions. -Given multifocal pneumonia we will empirically place the patient on IV Rocephin and Zithromax for possible bacterial superinfection only with elevated Procalcitonin. -The patient will be placed on scheduled Mucinex and as needed Tussionex. -We will avoid nebulization as much as we can, give bronchodilator MDI if needed, and with deterioration of oxygenation try to avoid BiPAP/CPAP if possible.    -Will obtain sputum Gram stain culture and sensitivity and follow blood cultures. -O2 protocol will be followed. -We will follow CRP, ferritin, LDH and D-dimer. -Will follow manual differential for ANC/ALC ratio as well as follow troponin I and daily CBC with manual differential and CMP. - Will place the patient on IV Remdesivir and IV steroid therapy with IV Solu-Medrol with elevated inflammatory markers. -The patient will be placed on vitamin D3, vitamin C, zinc sulfate, p.o. Pepcid and aspirin.  3.  Type 2 diabetes mellitus with peripheral neuropathy. - The patient will be placed on supplement coverage with NovoLog and basal coverage will be continued. - Lyrica will be continued.  4.  Essential hypertension. - We will continue his Toprol-XL, Cardizem CD and Cozaar.  5.  GERD. - PPI therapy will be resumed.  6.  COPD with pulmonary hypertension. - We will continue Spiriva and she will be placed on albuterol MDI. - We will continue her Daliresp. - We will hold off Symbicort.  7.  DVT prophylaxis. - Subcutaneous Lovenox.    All the records are reviewed and case discussed with ED provider. The plan of  care was discussed in details with the patient (and family). I answered all questions. The patient agreed to proceed with the above mentioned plan. Further management will depend upon hospital course.   CODE STATUS: Full code  Status is: Inpatient  Remains inpatient appropriate because:Ongoing diagnostic testing needed not appropriate for outpatient work up, Unsafe d/c plan, IV treatments appropriate due to intensity of illness or inability to take PO and Inpatient level of care appropriate due to severity of illness   Dispo: The patient is from: Home              Anticipated d/c  is to: Home              Anticipated d/c date is: > 3 days              Patient currently is not medically stable to d/c.    TOTAL TIME TAKING CARE OF THIS PATIENT: 55 minutes.    Christel Mormon M.D on 06/05/2020 at 3:34 AM  [facility]   From 7 PM-7 AM, contact night-coverage [email or web address]  CC: Primary care physician; Kirk Ruths, MD

## 2020-06-05 NOTE — ED Notes (Signed)
Pt ambulatory to restroom with steady gait noted  

## 2020-06-05 NOTE — ED Triage Notes (Signed)
COVID + 05/22/20. Hx of COPD and on 2L at home at baseline. 1 duoneb en route, 1 albuterol neb en route, 125 mg solumedrol en route. Pt 89% on 2L at home. Pt 98% on 4 L now. MD at bedside.

## 2020-06-05 NOTE — Progress Notes (Signed)
Remdesivir - Pharmacy Brief Note   A/P:  Remdesivir 200 mg IVPB once followed by 100 mg IVPB daily x 4 days.   Hart Robinsons, PharmD Clinical Pharmacist

## 2020-06-05 NOTE — Progress Notes (Signed)
No charge progress note.  Deanna Gay  is a 65 y.o. African-American female with a known history of hypertension, type 2 diabetes mellitus, COPD, chronic respiratory failure on home O2 at 2 L/min, schizophrenia and sleep apnea, who presented to the emergency room with acute onset of worsening dyspnea with desaturating to the 60s on her home O2.  She admitted to dry cough.  She has been having nausea without vomiting or diarrhea.  No fever or chills.  No loss of taste or smell.  She admitted to generalized weakness as well as muscle aches all over.  She has received 2 doses of COVID-19 vaccines and was getting ready to get her booster.  The patient was diagnosed with COVID-19 on 05/22/2020. Upon arrival of EMS pulse oximetry was 88% on 2 L of O2 by nasal cannula.  She was having bilateral wheezing.  She was given 2 DuoNeb as well as 125 mg of IV Solu-Medrol.. She later needed increased amount of oxygen to 4 L to maintain adequate pulse oximetry. She was started on steroid and remdesivir.  Patient was feeling better and back to her baseline oxygen requirement of 2 L when seen this morning.  She was more upset that she did not received her breakfast stating that she is diabetic and has to eat. Denies any other complaint.  Patient being positive for about 2 weeks now.  I do not know how much remdesivir will work. Continue current management. If remains stable on her baseline can be discharged home tomorrow. I do not think that she will need continuation of remdesivir at that point.

## 2020-06-05 NOTE — ED Notes (Signed)
Pt ambulatory to toilet to have BM. Pt kept on 2L [place] at this time. Steady gait noted.

## 2020-06-05 NOTE — ED Notes (Signed)
Pt called this nurse in. Pt angry and frustrated that "nothing being done" to treat her covid. Explained normal course of covid treatment and that remdisivir starts tomorrow. Pt states has not seen doctor. Messaged attending who said she did see this pt this morning and that pt was yelling at her. Pt c/o HA and that she "feels worse than when she was at home". Explained that nurses have multiple pts and will be back as soon as possible with PRN tylenol.

## 2020-06-05 NOTE — ED Notes (Signed)
Patient assisted to and from bathroom. Voided independently and ambulated with independent and steady gait. Patient placed back in bed with bed low and locked, side rails raised x2, cardiac monitor in place, and call bell in reach. Patient covered back with provided blankets. Snack tray and water provided per pt request and with physician approval. Pt denies further needs at this time.

## 2020-06-05 NOTE — ED Notes (Signed)
Pt voided. Bed linens changed and pericare provided. Placed back in bed with bed low and locked, side rails raised x2, and cardiac monitor in place. Call bell in reach.

## 2020-06-05 NOTE — ED Notes (Signed)
Patient breathing unlabored on arrival and speaking in fast loud sentences.

## 2020-06-05 NOTE — ED Provider Notes (Signed)
[facility] Emergency Department Provider Note  ____________________________________________  Time seen: Approximately 12:54 AM  I have reviewed the triage vital signs and the nursing notes.   HISTORY  Chief Complaint Respiratory Distress   HPI Deanna Gay is a 65 y.o. female with history of COPD, chronic respiratory failure on 2 L nasal cannula, hypertension, anemia, diabetes, sleep apnea, schizophrenia who presents from home for shortness of breath.  Patient reports that she was diagnosed with COVID on January 4.  Reports that she has been having progressively worsening shortness of breath.  This evening she felt severely short of breath and her sats were in the 60s which made her call 911.  When EMS arrived patient was on 2 L nasal cannula satting 88% with significant wheezing bilaterally.  She received 2 breathing breathing treatments and 125 mg of Solu-Medrol in route.  Upon arrival patient looks markedly improved respiratory wise.  She denies any chest pain, fever, vomiting, diarrhea.  She does report having to increase her oxygen at home to 4 L due to worsening shortness of breath.  Patient reports being vaccinated against COVID and influenza.   Past Medical History:  Diagnosis Date  . Anemia   . Arthritis   . Cancer ([place])    cervical  . COPD (chronic obstructive pulmonary disease) ([place])   . COPD exacerbation ([place]) 07/19/2017  . Diabetes mellitus without complication ([place])   . GERD (gastroesophageal reflux disease)   . Hypertension   . Schizophrenia ([place])   . Sleep apnea     Patient Active Problem List   Diagnosis Date Noted  . COPD with acute exacerbation ([place]) 05/14/2018  . Folate or folic acid deficiency anemia 01/26/2018  . Severe sepsis ([place]) 11/11/2017  . Severe obesity (BMI 35.0-39.9) with comorbidity ([place]) 03/12/2017  . PMB (postmenopausal bleeding) 12/04/2016  . Bilateral carpal tunnel syndrome 10/14/2016  . Healthcare  maintenance 08/18/2016  . Unilateral primary osteoarthritis, left hip 05/16/2016  . Status post left hip replacement 05/16/2016  . Iron deficiency anemia 02/27/2015  . Aortic atherosclerosis ([place]) 03/04/2014  . Sleep apnea 10/31/2013  . Hyperlipidemia 09/23/2013  . 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 Hypertension, essential 08/27/2013  . COPD (chronic obstructive pulmonary disease) ([place]) 08/27/2013  . Chronic undifferentiated schizophrenia ([place]) 08/27/2013    Past Surgical History:  Procedure Laterality Date  . APPENDECTOMY    . COLONOSCOPY WITH PROPOFOL N/A 12/25/2014   Procedure: COLONOSCOPY WITH PROPOFOL;  Surgeon: Josefine Class, MD;  Location: [facility]COPY;  Service: Endoscopy;  Laterality: N/A;  . COLONOSCOPY WITH PROPOFOL N/A 01/21/2018   Procedure: COLONOSCOPY WITH PROPOFOL;  Surgeon: Lin Landsman, MD;  Location: [facility] ENDOSCOPY;  Service: Gastroenterology;  Laterality: N/A;  . ESOPHAGOGASTRODUODENOSCOPY (EGD) WITH PROPOFOL N/A 12/25/2014   Procedure: ESOPHAGOGASTRODUODENOSCOPY (EGD) WITH PROPOFOL;  Surgeon: Josefine Class, MD;  Location: [facility] ENDOSCOPY;  Service: Endoscopy;  Laterality: N/A;  . ESOPHAGOGASTRODUODENOSCOPY (EGD) WITH PROPOFOL N/A 01/21/2018   Procedure: ESOPHAGOGASTRODUODENOSCOPY (EGD) WITH PROPOFOL;  Surgeon: Lin Landsman, MD;  Location: [facility] ENDOSCOPY;  Service: Gastroenterology;  Laterality: N/A;  . GIVENS CAPSULE STUDY N/A 03/02/2018   Procedure: GIVENS CAPSULE STUDY;  Surgeon: Lin Landsman, MD;  Location: [facility] ENDOSCOPY;  Service: Gastroenterology;  Laterality: N/A;  . JOINT REPLACEMENT     right hip  . TOTAL HIP ARTHROPLASTY Left 05/16/2016   Procedure: LEFT TOTAL HIP ARTHROPLASTY ANTERIOR APPROACH;  Surgeon: Mcarthur Rossetti, MD;  Location: [facility] ORS;  Service: Orthopedics;  Laterality: Left;    Prior to Admission  medications   Medication Sig Start Date End Date Taking? Authorizing Provider  ACCU-CHEK AVIVA PLUS test strip use 1 TEST STRIP to TEST BLOOD SUGAR four times a day 12/17/17   [provider]  albuterol (PROVENTIL HFA;VENTOLIN HFA) 108 (90 BASE) MCG/ACT inhaler Inhale 2 puffs into the lungs every 4 (four) hours as needed for wheezing or shortness of breath.     [provider]  Blood Glucose Monitoring Suppl (ACCU-CHEK AVIVA PLUS) w/Device KIT See admin instructions. 12/19/17   [provider]  budesonide-formoterol (SYMBICORT) 160-4.5 MCG/ACT inhaler Inhale 2 puffs into the lungs 2 (two) times daily.    [provider]  diclofenac sodium (VOLTAREN) 1 % GEL APP 2 GRAMS EXT AA QID 03/17/18   [provider]  diltiazem (CARDIZEM CD) 180 MG 24 hr capsule Take 180 mg by mouth at bedtime.     [provider]  FERREX 150 150 MG capsule TAKE 1 CAPSULE BY MOUTH TWICE A DAY 08/03/19   Lloyd Huger, MD  folic acid (FOLVITE) 1 MG tablet TAKE 1 TABLET(1 MG) BY MOUTH DAILY 02/01/18   Lin Landsman, MD  hydrochlorothiazide (HYDRODIURIL) 25 MG tablet take 1 tablet by mouth every morning 01/27/13   [provider]  insulin glargine (LANTUS) 100 UNIT/ML injection Inject 0.1 mLs (10 Units total) into the skin at bedtime. 11/19/17   Bettey Costa, MD  insulin lispro (HUMALOG) 100 UNIT/ML injection Please check your sugars before each meal and use lispro per the below sliding scale:  For sugars 151-200: take 2 units For 201-250: take 4units For 251-300: take 6 units For 301-350: take 8 units For 351-400: take 10 units For > 401: call MD 07/20/17   Gladstone Lighter, MD  Insulin Syringe-Needle U-100 (BD INSULIN SYRINGE ULTRAFINE) 31G X 5/16" 0.5 ML MISC use as directed once daily 06/25/17   [provider]  ipratropium-albuterol (DUONEB) 0.5-2.5 (3) MG/3ML SOLN Take 3 mLs by nebulization every 6 (six) hours as needed.    [provider]   methylPREDNISolone (MEDROL DOSEPAK) 4 MG TBPK tablet Take Tapered dose as directed 02/19/19   Sable Feil, PA-C  nitroGLYCERIN (NITROSTAT) 0.4 MG SL tablet Place 1 tablet under the tongue every 5 (five) minutes as needed. 08/17/17   [provider]  omeprazole (PRILOSEC) 20 MG capsule Take 20 mg by mouth 2 (two) times daily. 05/26/18   [provider]  oxyCODONE-acetaminophen (PERCOCET) 7.5-325 MG tablet Take 1-2 tablets by mouth every 6 (six) hours as needed for severe pain. 05/18/16   Mcarthur Rossetti, MD  predniSONE (DELTASONE) 10 MG tablet Label  & dispense according to the schedule below. 5 Pills PO for 1 day then, 4 Pills PO for 1 day, 3 Pills PO for 1 day, 2 Pills PO for 1 day, 1 Pill PO for 1 days then STOP. 05/16/18   Sainani, Belia Heman, MD  promethazine (PHENERGAN) 25 MG tablet Take 25 mg by mouth every 4 (four) hours as needed.  04/05/18   [provider]  triamcinolone (KENALOG) 0.1 % paste APPLY TO AREA OF IRRITATION TID PRN FOR PAIN AND SWELLING 07/12/18   [provider]  triamcinolone cream (KENALOG) 0.1 % Apply 1 application topically 2 (two) times daily.    [provider]  Vitamin D, Ergocalciferol, (DRISDOL) 1.25 MG (50000 UT) CAPS capsule Take 1 capsule (50,000  Units total) by mouth once a week. For 12 weeks 08/04/18   Lin Landsman, MD    Allergies Penicillins, Aspirin, Iodinated diagnostic agents, and Iodine  Family History  Problem Relation Age of Onset  . Hypertension Mother   . Diabetes Mellitus II Mother   . Breast cancer Neg Hx   . Kidney cancer Neg Hx   . Bladder Cancer Neg Hx     Social History Social History   Tobacco Use  . Smoking status: Current Every Day Smoker    Packs/day: 1.00    Years: 48.00    Pack years: 48.00    Types: Cigarettes  . Smokeless tobacco: Never Used  Vaping Use  . Vaping Use: Never used  Substance Use Topics  . Alcohol use: No  . Drug use: No    Review of  Systems  Constitutional: Negative for fever. Eyes: Negative for visual changes. ENT: Negative for sore throat. Neck: No neck pain  Cardiovascular: Negative for chest pain. Respiratory: + shortness of breath and cough Gastrointestinal: Negative for abdominal pain, vomiting or diarrhea. Genitourinary: Negative for dysuria. Musculoskeletal: Negative for back pain. Skin: Negative for rash. Neurological: Negative for headaches, weakness or numbness. Psych: No SI or HI  ____________________________________________   PHYSICAL EXAM:  VITAL SIGNS: ED Triage Vitals  Enc Vitals Group     BP 06/05/20 0016 132/69     Pulse Rate 06/05/20 0016 94     Resp 06/05/20 0016 16     Temp 06/05/20 0016 98.2 F (36.8 C)     Temp Source 06/05/20 0016 Oral     SpO2 06/05/20 0016 97 %     Weight 06/05/20 0014 223 lb (101.2 kg)     Height 06/05/20 0014 5' 4"  (1.626 m)     Head Circumference --      Peak Flow --      Pain Score 06/05/20 0014 0     Pain Loc --      Pain Edu? --      Excl. in [place]? --     Constitutional: Alert and oriented. Well appearing and in no apparent distress. HEENT:      Head: Normocephalic and atraumatic.         Eyes: Conjunctivae are normal. Sclera is non-icteric.       Mouth/Throat: Mucous membranes are moist.       Neck: Supple with no signs of meningismus. Cardiovascular: Regular rate and rhythm. No murmurs, gallops, or rubs. 2+ symmetrical distal pulses are present in all extremities. No JVD. Respiratory: Normal respiratory effort.  Satting well on 3 L nasal cannula. lungs are clear to auscultation bilaterally. No wheezes, crackles, or rhonchi.  Gastrointestinal: Soft, non tender. Musculoskeletal: No edema, cyanosis, or erythema of extremities. Neurologic: Normal speech and language. Face is symmetric. Moving all extremities. No gross focal neurologic deficits are appreciated. Skin: Skin is warm, dry and intact. No rash noted. Psychiatric: Mood and affect are  normal. Speech and behavior are normal.  ____________________________________________   LABS (all labs ordered are listed, but only abnormal results are displayed)  Labs Reviewed  CBC WITH DIFFERENTIAL/PLATELET - Abnormal; Notable for the following components:      Result Value   RBC 3.85 (*)    Hemoglobin 10.9 (*)    HCT 34.1 (*)    All other components within normal limits  COMPREHENSIVE METABOLIC PANEL - Abnormal; Notable for the following components:   Glucose, Bld 138 (*)    Creatinine, Ser 1.02 (*)  Calcium 8.2 (*)    Albumin 2.5 (*)    AST 12 (*)    All other components within normal limits  BRAIN NATRIURETIC PEPTIDE  PROCALCITONIN  TROPONIN I (HIGH SENSITIVITY)  TROPONIN I (HIGH SENSITIVITY)   ____________________________________________  EKG  ED ECG REPORT I, Rudene Re, the attending physician, personally viewed and interpreted this ECG.  Normal sinus rhythm, rate of 98, normal intervals, normal axis, no ST elevations or depressions.  Normal EKG. ____________________________________________  RADIOLOGY  I have personally reviewed the images performed during this visit and I agree with the Radiologist's read.   Interpretation by Radiologist:  DG Chest Portable 1 View  Result Date: 06/05/2020 CLINICAL DATA:  Shortness of breath with COVID pneumonia. EXAM: PORTABLE CHEST 1 VIEW COMPARISON:  02/19/2019 FINDINGS: The heart size is significantly enlarged. There is no pneumothorax or large pleural effusion. Bilateral hazy focal infiltrates are noted. The lungs are hyperexpanded. There are probable trace bilateral pleural effusions. Aortic calcifications are noted. IMPRESSION: Cardiomegaly with bilateral hazy focal infiltrates consistent with multifocal pneumonia (viral or bacterial). Electronically Signed   By: Constance Holster M.D.   On: 06/05/2020 00:30     ____________________________________________   PROCEDURES  Procedure(s) performed:yes .1-3  Lead EKG Interpretation Performed by: Rudene Re, MD Authorized by: Rudene Re, MD     Interpretation: normal     ECG rate assessment: normal     Rhythm: sinus rhythm     Ectopy: none     Critical Care performed: yes  CRITICAL CARE Performed by: Rudene Re  ?  Total critical care time: 35 min  Critical care time was exclusive of separately billable procedures and treating other patients.  Critical care was necessary to treat or prevent imminent or life-threatening deterioration.  Critical care was time spent personally by me on the following activities: development of treatment plan with patient and/or surrogate as well as nursing, discussions with consultants, evaluation of patient's response to treatment, examination of patient, obtaining history from patient or surrogate, ordering and performing treatments and interventions, ordering and review of laboratory studies, ordering and review of radiographic studies, pulse oximetry and re-evaluation of patient's condition.  ____________________________________________   INITIAL IMPRESSION / ASSESSMENT AND PLAN / ED COURSE  65 y.o. female with history of COPD, chronic respiratory failure on 2 L nasal cannula, hypertension, anemia, diabetes, sleep apnea, schizophrenia who presents from home for progressively worsening shortness of breath since being diagnosed with Covid on 05/22/20.  Patient initially hypoxic per EMS on her baseline 2 L.  Received 2 breathing treatments in route and arrives in no respiratory distress with normal WOB, normal sats on 3L [place], slightly decreased air movement with no wheezing or crackles. Patient looks euvolemic with normal EKG.  Ddx progression of Covid infection, bacterial PNA, edema, pericarditis, myocarditis, COPD exacerbation, PTX.  Chest x-ray visualized by me consistent with multifocal pneumonia, confirmed by radiology.  Patient has already received Solu-Medrol per EMS.  Will give 3  more duo nebs and check basic blood work.  Old medical records reviewed.  Patient placed on telemetry for close monitoring of respiratory status and cardiac activity.  _________________________ 2:11 AM on 06/05/2020 -----------------------------------------  Patient with worsening respiratory symptoms and increasing O2 demand in the setting of + Covid 19 two weeks ago. Will start remdesivir and discuss with the Hospitalist for admission.    _____________________________________________ Please note:  Patient was evaluated in Emergency Department today for the symptoms described in the history of present illness. Patient was evaluated in  the context of the global COVID-19 pandemic, which necessitated consideration that the patient might be at risk for infection with the SARS-CoV-2 virus that causes COVID-19. Institutional protocols and algorithms that pertain to the evaluation of patients at risk for COVID-19 are in a state of rapid change based on information released by regulatory bodies including the CDC and federal and state organizations. These policies and algorithms were followed during the patient's care in the ED.  Some ED evaluations and interventions may be delayed as a result of limited staffing during the pandemic.   [place] Controlled Substance Database was reviewed by me. ____________________________________________   FINAL CLINICAL IMPRESSION(S) / ED DIAGNOSES   Final diagnoses:  Acute on chronic respiratory failure with hypoxia ([place])  Pneumonia due to COVID-19 virus      NEW MEDICATIONS STARTED DURING THIS VISIT:  ED Discharge Orders    None       Note:  This document was prepared using Dragon voice recognition software and may include unintentional dictation errors.    Rudene Re, MD 06/05/20 [phone]

## 2020-06-05 NOTE — Progress Notes (Signed)
PHARMACIST - PHYSICIAN COMMUNICATION  CONCERNING:  Enoxaparin (Lovenox) for DVT Prophylaxis    RECOMMENDATION: Patient was prescribed enoxaprin 49m q24 hours for VTE prophylaxis.   Filed Weights   06/05/20 0014  Weight: 101.2 kg (223 lb)    Body mass index is 38.28 kg/m.  Estimated Creatinine Clearance: 64.5 mL/min (A) (by C-G formula based on SCr of 1.02 mg/dL (H)).   [place] for enoxaparin 0.598mkg TBW SQ every 24 hours based on BMI being >30.  DESCRIPTION: Pharm[facility]  Patient is now receiving enoxaparin 50 mg every 24 hours    Deanna DawleyPharmD Clinical Pharmacist  06/05/2020 3:06 AM

## 2020-06-05 NOTE — ED Notes (Signed)
Sent secure chat to receiving nurse.

## 2020-06-05 NOTE — ED Notes (Signed)
cmp and procalcitonin re-collected and sent to lab. Patient provided with 2 warm blankets per request.

## 2020-06-05 NOTE — ED Notes (Signed)
Patient assisted with use of bedpan. Positioned back in bed for comfort with cardiac monitor in place. Call bell in reach.

## 2020-06-06 DIAGNOSIS — J441 Chronic obstructive pulmonary disease with (acute) exacerbation: Secondary | ICD-10-CM

## 2020-06-06 LAB — CBC WITH DIFFERENTIAL/PLATELET
Abs Immature Granulocytes: 0.09 10*3/uL — ABNORMAL HIGH (ref 0.00–0.07)
Basophils Absolute: 0 10*3/uL (ref 0.0–0.1)
Basophils Relative: 0 %
Eosinophils Absolute: 0 10*3/uL (ref 0.0–0.5)
Eosinophils Relative: 0 %
HCT: 36.1 % (ref 36.0–46.0)
Hemoglobin: 11.8 g/dL — ABNORMAL LOW (ref 12.0–15.0)
Immature Granulocytes: 1 %
Lymphocytes Relative: 7 %
Lymphs Abs: 0.7 10*3/uL (ref 0.7–4.0)
MCH: 28.7 pg (ref 26.0–34.0)
MCHC: 32.7 g/dL (ref 30.0–36.0)
MCV: 87.8 fL (ref 80.0–100.0)
Monocytes Absolute: 0.2 10*3/uL (ref 0.1–1.0)
Monocytes Relative: 2 %
Neutro Abs: 8.7 10*3/uL — ABNORMAL HIGH (ref 1.7–7.7)
Neutrophils Relative %: 90 %
Platelets: 314 10*3/uL (ref 150–400)
RBC: 4.11 MIL/uL (ref 3.87–5.11)
RDW: 14.6 % (ref 11.5–15.5)
WBC: 9.7 10*3/uL (ref 4.0–10.5)
nRBC: 0 % (ref 0.0–0.2)

## 2020-06-06 LAB — GLUCOSE, CAPILLARY
Glucose-Capillary: 491 mg/dL — ABNORMAL HIGH (ref 70–99)
Glucose-Capillary: 346 mg/dL — ABNORMAL HIGH (ref 70–99)
Glucose-Capillary: 289 mg/dL — ABNORMAL HIGH (ref 70–99)

## 2020-06-06 LAB — COMPREHENSIVE METABOLIC PANEL
ALT: 17 U/L (ref 0–44)
AST: 13 U/L — ABNORMAL LOW (ref 15–41)
Albumin: 2.8 g/dL — ABNORMAL LOW (ref 3.5–5.0)
Alkaline Phosphatase: 70 U/L (ref 38–126)
Anion gap: 12 (ref 5–15)
BUN: 24 mg/dL — ABNORMAL HIGH (ref 8–23)
CO2: 27 mmol/L (ref 22–32)
Calcium: 9 mg/dL (ref 8.9–10.3)
Chloride: 97 mmol/L — ABNORMAL LOW (ref 98–111)
Creatinine, Ser: 1.19 mg/dL — ABNORMAL HIGH (ref 0.44–1.00)
GFR, Estimated: 51 mL/min — ABNORMAL LOW (ref >60)
Glucose, Bld: 413 mg/dL — ABNORMAL HIGH (ref 70–99)
Potassium: 4.9 mmol/L (ref 3.5–5.1)
Sodium: 136 mmol/L (ref 135–145)
Total Bilirubin: 0.5 mg/dL (ref 0.3–1.2)
Total Protein: 7.6 g/dL (ref 6.5–8.1)

## 2020-06-06 LAB — HEMOGLOBIN A1C
Hgb A1c MFr Bld: 8.2 % — ABNORMAL HIGH (ref 4.8–5.6)
Mean Plasma Glucose: 188.64 mg/dL

## 2020-06-06 LAB — C-REACTIVE PROTEIN: CRP: 6.3 mg/dL — ABNORMAL HIGH (ref <1.0)

## 2020-06-06 LAB — FIBRIN DERIVATIVES D-DIMER (ARMC ONLY): Fibrin derivatives D-dimer (ARMC): 761.67 ng/mL (FEU) — ABNORMAL HIGH (ref 0.00–499.00)

## 2020-06-06 LAB — FERRITIN: Ferritin: 82 ng/mL (ref 11–307)

## 2020-06-06 MED ORDER — INSULIN GLARGINE 100 UNIT/ML ~~LOC~~ SOLN
10.0000 [IU] | Freq: Two times a day (BID) | SUBCUTANEOUS | Status: DC
  Filled 2020-06-06 (×2): qty 0.1

## 2020-06-06 MED ORDER — INSULIN GLARGINE 100 UNIT/ML ~~LOC~~ SOLN
25.0000 [IU] | Freq: Every day | SUBCUTANEOUS | Status: DC
  Administered 2020-06-06: 25 [IU] via SUBCUTANEOUS
  Filled 2020-06-06 (×2): qty 0.25

## 2020-06-06 MED ORDER — IPRATROPIUM-ALBUTEROL 20-100 MCG/ACT IN AERS
1.0000 | INHALATION_SPRAY | Freq: Four times a day (QID) | RESPIRATORY_TRACT | Status: DC
  Filled 2020-06-06: qty 4

## 2020-06-06 MED ORDER — INSULIN ASPART 100 UNIT/ML ~~LOC~~ SOLN
0.0000 [IU] | Freq: Every day | SUBCUTANEOUS | Status: DC
  Administered 2020-06-06: 3 [IU] via SUBCUTANEOUS
  Filled 2020-06-06: qty 1

## 2020-06-06 MED ORDER — INSULIN GLARGINE 100 UNIT/ML ~~LOC~~ SOLN
10.0000 [IU] | Freq: Every day | SUBCUTANEOUS | Status: DC
  Filled 2020-06-06: qty 0.1

## 2020-06-06 MED ORDER — FLUTICASONE FUROATE-VILANTEROL 200-25 MCG/INH IN AEPB
1.0000 | INHALATION_SPRAY | Freq: Every day | RESPIRATORY_TRACT | Status: DC
  Filled 2020-06-06: qty 28

## 2020-06-06 MED ORDER — INSULIN GLARGINE 100 UNIT/ML ~~LOC~~ SOLN
25.0000 [IU] | Freq: Every day | SUBCUTANEOUS | Status: DC
  Filled 2020-06-06: qty 0.25

## 2020-06-06 MED ORDER — INSULIN ASPART 100 UNIT/ML ~~LOC~~ SOLN
0.0000 [IU] | Freq: Three times a day (TID) | SUBCUTANEOUS | Status: DC
  Administered 2020-06-06: 12:00:00 20 [IU] via SUBCUTANEOUS
  Administered 2020-06-06: 15 [IU] via SUBCUTANEOUS
  Filled 2020-06-06 (×2): qty 1

## 2020-06-06 NOTE — Progress Notes (Signed)
Inpatient Diabetes Program Recommendations  AACE/ADA: New Consensus Statement on Inpatient Glycemic Control (2015)  Target Ranges:  Prepandial:   less than 140 mg/dL      Peak postprandial:   less than 180 mg/dL (1-2 hours)      Critically ill patients:  140 - 180 mg/dL   Lab Results  Component Value Date   GLUCAP 491 (H) 06/06/2020   HGBA1C 7.6 (H) 11/11/2017    Review of Glycemic Control Results for Deanna Gay, Deanna Gay (MRN [id number]) as of 06/06/2020 12:35  Ref. Range 06/06/2020 11:49 06/06/2020 11:52  Glucose-Capillary Latest Ref Range: 70 - 99 mg/dL 506 (HH) 491 (H)   Diabetes history: Type 2 DM Outpatient Diabetes medications: Lantus 10 units QHS, Humalog 2-10 units TID Current orders for Inpatient glycemic control: Lantus 10 units QHS, Novolog 0-20 units TID, Novolog 0-5 units QHS Solumedrol 101 mg BID  Inpatient Diabetes Program Recommendations:    In the setting of Covid and steroids consider: -Further increasing Lantus to 15 units BID -Adding tradjenta 5 mg QD -Novolog 5 units TID (assuming patient is consuming >50% of meals).  -Adding A1C, last result from 2019  Thanks, Bronson Curb, MSN, RNC-OB Diabetes Coordinator [phone] (8a-5p)

## 2020-06-06 NOTE — Progress Notes (Signed)
PROGRESS NOTE    Deanna Gay  QJF:[id number] DOB: 1956-03-26 DOA: 06/05/2020 PCP: Kirk Ruths, MD    Assessment & Plan:   Active Problems:   Acute hypoxemic respiratory failure due to COVID-19 [facility])   Deanna Gay  is a 65 y.o. African-American female with a known history of hypertension, type 2 diabetes mellitus, COPD, chronic respiratory failure on home O2 at 2 L/min, schizophrenia and sleep apnea, who presented to the emergency room with acute onset of worsening dyspnea with desaturating to the 60s on her home O2.  She admitted to dry cough.  She has been having nausea without vomiting or diarrhea.  No fever or chills.  No loss of taste or smell.  She admitted to generalized weakness as well as muscle aches all over.  She has received 2 doses of COVID-19 vaccines and was getting ready to get her booster.  The patient was diagnosed with COVID-19 on 05/22/2020. Upon arrival of EMS pulse oximetry was 88% on 2 L of O2 by nasal cannula.  She was having bilateral wheezing.  She was given 2 DuoNeb as well as 125 mg of IV Solu-Medrol.. She later needed increased amount of oxygen to 4 L to maintain adequate pulse oximetry. Pulse oximetry was initially 88% on room air.   1. Acute hypoxemic respiratory failure secondary to COVID-19   chronic respiratory failure on 2L O2 baseline Continue supplemental O2 to keep sats between 88-92%, wean as tolerated  2. Multifocal pneumonia secondary to COVID-19. --procal neg.  CRP 6.3. Plan: --cont Remdesivir --cont solumedrol --scheduled Mucinex and as needed Tussionex. --Pt refused Combivent --order Breo Ellipta  Likely COPD exacerbation --triggered by covid PNA Plan: --cont solumedrol --will discharge on a steroid taper --Pt refused Combivent --order Breo Ellipta  3.  Type 2 diabetes mellitus uncontrolled - A1c 8.2 --resume Lantus 25u at 6 pm, per pt's instruction --SSI resistant scale  peripheral neuropathy. --cont  Lyrica  4.  Essential hypertension. --cont cardizem, HCTZ, losartan and Toprol  5.  GERD. - cont PPI    DVT prophylaxis: Lovenox SQ Code Status: Full code  Family Communication:  Status is: inpatient Dispo:   The patient is from: home Anticipated d/c is to: home Anticipated d/c date is: 1-2 days Patient currently is not medically ready to d/c due to: covid and COPD exacerbation.   Subjective and Interval History:  Pt refused her Lantus last night due to it being offered too late.  BG elevated this morning to 500's.   Pt reported breathing better.  No cough.  Pt said she doesn't believe she has COVID.  Also insisted that she hasn't received any solumedrol since presentation (3 doses charted as given).     Objective: Vitals:   06/06/20 0744 06/06/20 0944 06/06/20 1610 06/06/20 1922  BP: (!) 159/81 127/62 (!) 146/71 133/75  Pulse: 93 91 85 92  Resp: 18  18 18   Temp: (!) 97.5 F (36.4 C)  97.9 F (36.6 C) 98 F (36.7 C)  TempSrc:      SpO2: 92%  96% 91%  Weight:      Height:        Intake/Output Summary (Last 24 hours) at 06/06/2020 2033 Last data filed at 06/06/2020 1530 Gross per 24 hour  Intake 880.12 ml  Output 1600 ml  Net -719.88 ml   Filed Weights   06/05/20 0014 06/05/20 1625  Weight: 101.2 kg 99.3 kg    Examination:   Constitutional: NAD, AAOx3 HEENT: conjunctivae and lids  normal, EOMI CV: No cyanosis.   RESP: normal respiratory effort, on RA (Pt took off [place] herself).   Extremities: No effusions, edema in BLE SKIN: warm, dry Neuro: II - XII grossly intact.   Psych: Normal mood and affect.     Data Reviewed: I have personally reviewed following labs and imaging studies  CBC: Recent Labs  Lab 06/05/20 0018 06/06/20 0420  WBC 8.4 9.7  NEUTROABS 5.8 8.7*  HGB 10.9* 11.8*  HCT 34.1* 36.1  MCV 88.6 87.8  PLT 273 409   Basic Metabolic Panel: Recent Labs  Lab 06/05/20 0129 06/06/20 0420  NA 140 136  K 4.4 4.9  CL 104 97*  CO2 26 27   GLUCOSE 138* 413*  BUN 15 24*  CREATININE 1.02* 1.19*  CALCIUM 8.2* 9.0   GFR: Estimated Creatinine Clearance: 54.7 mL/min (A) (by C-G formula based on SCr of 1.19 mg/dL (H)). Liver Function Tests: Recent Labs  Lab 06/05/20 0129 06/06/20 0420  AST 12* 13*  ALT 13 17  ALKPHOS 62 70  BILITOT 0.7 0.5  PROT 6.7 7.6  ALBUMIN 2.5* 2.8*   No results for input(s): LIPASE, AMYLASE in the last 168 hours. No results for input(s): AMMONIA in the last 168 hours. Coagulation Profile: No results for input(s): INR, PROTIME in the last 168 hours. Cardiac Enzymes: No results for input(s): CKTOTAL, CKMB, CKMBINDEX, TROPONINI in the last 168 hours. BNP (last 3 results) No results for input(s): PROBNP in the last 8760 hours. HbA1C: Recent Labs    06/06/20 0420  HGBA1C 8.2*   CBG: Recent Labs  Lab 06/06/20 1149 06/06/20 1152 06/06/20 1612  GLUCAP 506* 491* 346*   Lipid Profile: No results for input(s): CHOL, HDL, LDLCALC, TRIG, CHOLHDL, LDLDIRECT in the last 72 hours. Thyroid Function Tests: No results for input(s): TSH, T4TOTAL, FREET4, T3FREE, THYROIDAB in the last 72 hours. Anemia Panel: Recent Labs    06/06/20 0420  FERRITIN 82   Sepsis Labs: Recent Labs  Lab 06/05/20 0129  PROCALCITON <0.10    No results found for this or any previous visit (from the past 240 hour(s)).    Radiology Studies: DG Chest Portable 1 View  Result Date: 06/05/2020 CLINICAL DATA:  Shortness of breath with COVID pneumonia. EXAM: PORTABLE CHEST 1 VIEW COMPARISON:  02/19/2019 FINDINGS: The heart size is significantly enlarged. There is no pneumothorax or large pleural effusion. Bilateral hazy focal infiltrates are noted. The lungs are hyperexpanded. There are probable trace bilateral pleural effusions. Aortic calcifications are noted. IMPRESSION: Cardiomegaly with bilateral hazy focal infiltrates consistent with multifocal pneumonia (viral or bacterial). Electronically Signed   By: Constance Holster M.D.   On: 06/05/2020 00:30     Scheduled Meds: . vitamin C  500 mg Oral Daily  . cholecalciferol  1,000 Units Oral Daily  . diltiazem  180 mg Oral QHS  . enoxaparin (LOVENOX) injection  50 mg Subcutaneous Q24H  . escitalopram  10 mg Oral Daily  . famotidine  20 mg Oral BID  . guaiFENesin  600 mg Oral BID  . hydrochlorothiazide  25 mg Oral q morning - 10a  . insulin aspart  0-20 Units Subcutaneous TID WC  . insulin aspart  0-5 Units Subcutaneous QHS  . insulin glargine  25 Units Subcutaneous Daily  . Ipratropium-Albuterol  1 puff Inhalation QID  . losartan  50 mg Oral Daily  . methylPREDNISolone (SOLU-MEDROL) injection  1 mg/kg Intravenous Q12H   Followed by  . [START ON 06/08/2020] predniSONE  50  mg Oral Daily  . metoprolol succinate  25 mg Oral Daily  . nicotine  21 mg Transdermal Daily  . pantoprazole  40 mg Oral Daily  . pregabalin  25 mg Oral Q6H  . [START ON 06/10/2020] Vitamin D (Ergocalciferol)  50,000 Units Oral Weekly  . zinc sulfate  220 mg Oral Daily   Continuous Infusions: . remdesivir 100 mg in NS 100 mL Stopped (06/06/20 1857)     LOS: 1 day     Enzo Bi, MD [facility] If 7PM-7AM, please contact night-coverage 06/06/2020, 8:33 PM

## 2020-06-07 LAB — CBC
HCT: 36 % (ref 36.0–46.0)
Hemoglobin: 11.3 g/dL — ABNORMAL LOW (ref 12.0–15.0)
MCH: 28 pg (ref 26.0–34.0)
MCHC: 31.4 g/dL (ref 30.0–36.0)
MCV: 89.1 fL (ref 80.0–100.0)
Platelets: 289 10*3/uL (ref 150–400)
RBC: 4.04 MIL/uL (ref 3.87–5.11)
RDW: 14.5 % (ref 11.5–15.5)
WBC: 10.9 10*3/uL — ABNORMAL HIGH (ref 4.0–10.5)
nRBC: 0 % (ref 0.0–0.2)

## 2020-06-07 LAB — BASIC METABOLIC PANEL
Anion gap: 8 (ref 5–15)
BUN: 37 mg/dL — ABNORMAL HIGH (ref 8–23)
CO2: 32 mmol/L (ref 22–32)
Calcium: 9.2 mg/dL (ref 8.9–10.3)
Chloride: 97 mmol/L — ABNORMAL LOW (ref 98–111)
Creatinine, Ser: 1.51 mg/dL — ABNORMAL HIGH (ref 0.44–1.00)
GFR, Estimated: 38 mL/min — ABNORMAL LOW (ref >60)
Glucose, Bld: 416 mg/dL — ABNORMAL HIGH (ref 70–99)
Potassium: 4.7 mmol/L (ref 3.5–5.1)
Sodium: 137 mmol/L (ref 135–145)

## 2020-06-07 LAB — C-REACTIVE PROTEIN: CRP: 3.1 mg/dL — ABNORMAL HIGH (ref <1.0)

## 2020-06-07 LAB — MAGNESIUM: Magnesium: 2 mg/dL (ref 1.7–2.4)

## 2020-06-07 NOTE — Progress Notes (Signed)
Pt leaving AMA. States that she has a court date today and needs to go because she doesn't want to go to jail. Pt educated on procedure and possibility of death if leaves AMA. Pt understands and still wants to leave. AMA paper signed. MD notified.

## 2020-06-08 LAB — GLUCOSE, CAPILLARY: Glucose-Capillary: 506 mg/dL (ref 70–99)

## 2020-06-15 NOTE — Discharge Summary (Signed)
Physician Against Medical Advise Discharge Summary   Deanna Gay  female DOB: 01-Jun-1955  IDP:[id number]  PCP: Kirk Ruths, MD  Admit date: 06/05/2020 Left AMA date: 06/07/2020  Admitted From: home CODE STATUS: Full code   Hospital Course:  For full details, please see H&P, progress notes, consult notes and ancillary notes.  Briefly,  MichellaEdwardsis a41 y.o.African-American femalewith a known history of hypertension, type 2 diabetes mellitus, COPD, chronic respiratory failure on home O2 at 2 L/min, schizophrenia and sleep apnea, who presented to the emergency room with acute onset of worsening dyspnea with desaturating to the 60s on her home O2.  She has received 2 doses of COVID-19 vaccines and was getting ready to get her booster. The patient was diagnosed with COVID-19 on 05/22/2020.  Upon arrival of EMS pulse oximetry was 88% on 2 L of O2 by nasal cannula.Shewas having bilateral wheezing. Shewas given 2 DuoNeb as well as 125 mg of IV Solu-Medrol.  She later needed increased amount of oxygen to 4 L to maintain adequate pulse oximetry. Pulse oximetry was initially 88% on room air.   # Acute hypoxemic respiratory failure secondary to COVID-19 # chronic respiratory failure on 2L O2 baseline Continued supplemental O2 to keep sats between 88-92%.  2. Multifocal pneumonia secondary to COVID-19. procal neg.  CRP 6.3.  Pt was started on Remdesivir, solumedrol.  Pt refused Combivent or home bronchodilators.  Likely COPD exacerbation triggered by covid PNA.  Pt was started on solumedrol.  Pt refused Combivent or home bronchodilators.  3. Type 2 diabetes mellitus uncontrolled A1c 8.2.  Pt was prescribed Lantus 25u at 6 pm, per pt's instruction and SSI.  peripheral neuropathy. Continued Lyrica  4. Essential hypertension. Continued cardizem, HCTZ, losartan and Toprol  5. GERD. Continued PPI   Discharge Diagnoses:  Active Problems:   Acute  hypoxemic respiratory failure due to COVID-19 [facility])    Discharge Instructions:  Allergies as of 06/07/2020      Reactions   Penicillins Swelling   Has patient had a PCN reaction causing immediate rash, facial/tongue/throat swelling, SOB or lightheadedness with hypotension: Yes Has patient had a PCN reaction causing severe rash involving mucus membranes or skin necrosis: Yes Has patient had a PCN reaction that required hospitalization: No Has patient had a PCN reaction occurring within the last 10 years: No If all of the above answers are "NO", then may proceed with Cephalosporin use. Has patient had a PCN reaction causing immediate rash, facial/tongue/throat swelling, SOB or lightheadedness with hypotension: Yes Has patient had a PCN reaction causing severe rash involving mucus membranes or skin necrosis: Yes Has patient had a PCN reaction that required hospitalization: No Has patient had a PCN reaction occurring within the last 10 years: No If all of the above answers are "NO", then may proceed with Cephalosporin use. Has patient had a PCN reaction causing immediate rash, faci   Aspirin Other (See Comments)   Ongoing anemia   Iodinated Diagnostic Agents Swelling   Iodine Swelling      Medication List    ASK your doctor about these medications   Accu-Chek Aviva Plus test strip Generic drug: glucose blood use 1 TEST STRIP to TEST BLOOD SUGAR four times a day   Accu-Chek Aviva Plus w/Device Kit See admin instructions.   albuterol 108 (90 Base) MCG/ACT inhaler Commonly known as: VENTOLIN HFA Inhale 2 puffs into the lungs every 4 (four) hours as needed for wheezing or shortness of breath.   BD Insulin  Syringe Ultrafine 31G X 5/16" 0.5 ML Misc Generic drug: Insulin Syringe-Needle U-100 use as directed once daily   budesonide-formoterol 160-4.5 MCG/ACT inhaler Commonly known as: SYMBICORT Inhale 2 puffs into the lungs 2 (two) times daily.   diltiazem 180 MG 24 hr  capsule Commonly known as: CARDIZEM CD Take 180 mg by mouth at bedtime.   escitalopram 10 MG tablet Commonly known as: LEXAPRO Take 10 mg by mouth daily.   furosemide 20 MG tablet Commonly known as: LASIX Take 20 mg by mouth daily.   hydrochlorothiazide 25 MG tablet Commonly known as: HYDRODIURIL take 1 tablet by mouth every morning   insulin glargine 100 UNIT/ML injection Commonly known as: LANTUS Inject 0.1 mLs (10 Units total) into the skin at bedtime.   insulin lispro 100 UNIT/ML injection Commonly known as: HumaLOG Please check your sugars before each meal and use lispro per the below sliding scale:  For sugars 151-200: take 2 units For 201-250: take 4units For 251-300: take 6 units For 301-350: take 8 units For 351-400: take 10 units For > 401: call MD   ipratropium-albuterol 0.5-2.5 (3) MG/3ML Soln Commonly known as: DUONEB Take 3 mLs by nebulization every 6 (six) hours as needed.   losartan 50 MG tablet Commonly known as: COZAAR Take 50 mg by mouth daily.   meloxicam 15 MG tablet Commonly known as: MOBIC Take 15 mg by mouth daily.   metoprolol succinate 25 MG 24 hr tablet Commonly known as: TOPROL-XL Take 25 mg by mouth daily.   montelukast 10 MG tablet Commonly known as: SINGULAIR Take 10 mg by mouth at bedtime.   nitroGLYCERIN 0.4 MG SL tablet Commonly known as: NITROSTAT Place 1 tablet under the tongue every 5 (five) minutes as needed.   omeprazole 20 MG capsule Commonly known as: PRILOSEC Take 20 mg by mouth 2 (two) times daily.   oxyCODONE-acetaminophen 7.5-325 MG tablet Commonly known as: PERCOCET Take 1-2 tablets by mouth every 6 (six) hours as needed for severe pain.   predniSONE 10 MG tablet Commonly known as: DELTASONE Label  & dispense according to the schedule below. 5 Pills PO for 1 day then, 4 Pills PO for 1 day, 3 Pills PO for 1 day, 2 Pills PO for 1 day, 1 Pill PO for 1 days then STOP.         Allergies  Allergen  Reactions  . Penicillins Swelling    Has patient had a PCN reaction causing immediate rash, facial/tongue/throat swelling, SOB or lightheadedness with hypotension: Yes Has patient had a PCN reaction causing severe rash involving mucus membranes or skin necrosis: Yes Has patient had a PCN reaction that required hospitalization: No Has patient had a PCN reaction occurring within the last 10 years: No If all of the above answers are "NO", then may proceed with Cephalosporin use.  Has patient had a PCN reaction causing immediate rash, facial/tongue/throat swelling, SOB or lightheadedness with hypotension: Yes Has patient had a PCN reaction causing severe rash involving mucus membranes or skin necrosis: Yes Has patient had a PCN reaction that required hospitalization: No Has patient had a PCN reaction occurring within the last 10 years: No If all of the above answers are "NO", then may proceed with Cephalosporin use. Has patient had a PCN reaction causing immediate rash, faci  . Aspirin Other (See Comments)    Ongoing anemia  . Iodinated Diagnostic Agents Swelling  . Iodine Swelling     The results of significant diagnostics from this hospitalization (including  imaging, microbiology, ancillary and laboratory) are listed below for reference.   Consultations:   Procedures/Studies: DG Chest Portable 1 View  Result Date: 06/05/2020 CLINICAL DATA:  Shortness of breath with COVID pneumonia. EXAM: PORTABLE CHEST 1 VIEW COMPARISON:  02/19/2019 FINDINGS: The heart size is significantly enlarged. There is no pneumothorax or large pleural effusion. Bilateral hazy focal infiltrates are noted. The lungs are hyperexpanded. There are probable trace bilateral pleural effusions. Aortic calcifications are noted. IMPRESSION: Cardiomegaly with bilateral hazy focal infiltrates consistent with multifocal pneumonia (viral or bacterial). Electronically Signed   By: Constance Holster M.D.   On: 06/05/2020 00:30       Labs: BNP (last 3 results) Recent Labs    06/05/20 0018  BNP 88.9   Basic Metabolic Panel: No results for input(s): NA, K, CL, CO2, GLUCOSE, BUN, CREATININE, CALCIUM, MG, PHOS in the last 168 hours. Liver Function Tests: No results for input(s): AST, ALT, ALKPHOS, BILITOT, PROT, ALBUMIN in the last 168 hours. No results for input(s): LIPASE, AMYLASE in the last 168 hours. No results for input(s): AMMONIA in the last 168 hours. CBC: No results for input(s): WBC, NEUTROABS, HGB, HCT, MCV, PLT in the last 168 hours. Cardiac Enzymes: No results for input(s): CKTOTAL, CKMB, CKMBINDEX, TROPONINI in the last 168 hours. BNP: Invalid input(s): POCBNP CBG: No results for input(s): GLUCAP in the last 168 hours. D-Dimer No results for input(s): DDIMER in the last 72 hours. Hgb A1c No results for input(s): HGBA1C in the last 72 hours. Lipid Profile No results for input(s): CHOL, HDL, LDLCALC, TRIG, CHOLHDL, LDLDIRECT in the last 72 hours. Thyroid function studies No results for input(s): TSH, T4TOTAL, T3FREE, THYROIDAB in the last 72 hours.  Invalid input(s): FREET3 Anemia work up No results for input(s): VITAMINB12, FOLATE, FERRITIN, TIBC, IRON, RETICCTPCT in the last 72 hours. Urinalysis    Component Value Date/Time   COLORURINE AMBER (A) 11/11/2017 1226   APPEARANCEUR Cloudy (A) 12/04/2017 0843   LABSPEC 1.017 11/11/2017 1226   LABSPEC 1.005 10/24/2011 1742   PHURINE 5.0 11/11/2017 1226   GLUCOSEU Negative 12/04/2017 0843   GLUCOSEU Negative 10/24/2011 1742   HGBUR MODERATE (A) 11/11/2017 1226   BILIRUBINUR Negative 12/04/2017 0843   BILIRUBINUR Negative 10/24/2011 1742   KETONESUR NEGATIVE 11/11/2017 1226   PROTEINUR Trace (A) 12/04/2017 0843   PROTEINUR 100 (A) 11/11/2017 1226   NITRITE Negative 12/04/2017 0843   NITRITE NEGATIVE 11/11/2017 1226   LEUKOCYTESUR 2+ (A) 12/04/2017 0843   LEUKOCYTESUR 3+ 10/24/2011 1742   Sepsis Labs Invalid input(s):  PROCALCITONIN,  WBC,  LACTICIDVEN Microbiology No results found for this or any previous visit (from the past 240 hour(s)).   Pt left AMA.  No charge note.   Enzo Bi, MD  [facility] 06/15/2020, 1:44 PM

## 2020-07-02 ENCOUNTER — Telehealth: Payer: Self-pay | Admitting: *Deleted

## 2020-07-02 NOTE — Telephone Encounter (Signed)
She can come in for lab this week and then see Sonia Baller on Friday for IV iron if needed.

## 2020-07-02 NOTE — Telephone Encounter (Signed)
07/02/2020 Called pt and informed her of new appts for labs on 2/15, f/u with Rulon Abide on 2/16, and possible iron on 2/17; per Haywood Pao due to infusion being at max on Friday 2/18. PT confirmed appts  SRW

## 2020-07-02 NOTE — Telephone Encounter (Signed)
Patient called stating that she feels she needs iron due to overly tired. She has no follow up appointments with Dr Grayland Ormond. Please advise.     Component Ref Range & Units 3 wk ago  (06/06/20) 8 mo ago  (10/31/19) 1 yr ago  (05/02/19) 1 yr ago  (03/09/19) 1 yr ago  (07/28/18) 2 yr ago  (05/11/18) 2 yr ago  (03/03/18)  Ferritin 11 - 307 ng/mL 82  33 CM  29 CM  35 CM  68 R  174 CM  83 CM

## 2020-07-03 ENCOUNTER — Other Ambulatory Visit
Admission: RE | Admit: 2020-07-03 | Discharge: 2020-07-03 | Disposition: A | Discharge status: Home / Self Care | Payer: Medicare Other | Source: Ambulatory Visit | Attending: Specialist | Admitting: Specialist

## 2020-07-03 ENCOUNTER — Other Ambulatory Visit: Payer: Self-pay | Admitting: Specialist

## 2020-07-03 ENCOUNTER — Telehealth: Payer: Self-pay | Admitting: Oncology

## 2020-07-03 ENCOUNTER — Inpatient Hospital Stay: Payer: Medicare Other | Attending: Oncology

## 2020-07-03 ENCOUNTER — Other Ambulatory Visit: Payer: Self-pay | Admitting: *Deleted

## 2020-07-03 DIAGNOSIS — R0602 Shortness of breath: Secondary | ICD-10-CM | POA: Insufficient documentation

## 2020-07-03 DIAGNOSIS — R319 Hematuria, unspecified: Secondary | ICD-10-CM | POA: Insufficient documentation

## 2020-07-03 DIAGNOSIS — R7989 Other specified abnormal findings of blood chemistry: Secondary | ICD-10-CM

## 2020-07-03 DIAGNOSIS — D509 Iron deficiency anemia, unspecified: Secondary | ICD-10-CM | POA: Insufficient documentation

## 2020-07-03 DIAGNOSIS — R0902 Hypoxemia: Secondary | ICD-10-CM | POA: Diagnosis not present

## 2020-07-03 LAB — BASIC METABOLIC PANEL
Anion gap: 11 (ref 5–15)
BUN: 17 mg/dL (ref 8–23)
CO2: 31 mmol/L (ref 22–32)
Calcium: 8.6 mg/dL — ABNORMAL LOW (ref 8.9–10.3)
Chloride: 96 mmol/L — ABNORMAL LOW (ref 98–111)
Creatinine, Ser: 1.31 mg/dL — ABNORMAL HIGH (ref 0.44–1.00)
GFR, Estimated: 45 mL/min — ABNORMAL LOW (ref >60)
Glucose, Bld: 114 mg/dL — ABNORMAL HIGH (ref 70–99)
Potassium: 4.4 mmol/L (ref 3.5–5.1)
Sodium: 138 mmol/L (ref 135–145)

## 2020-07-03 LAB — FIBRIN DERIVATIVES D-DIMER (ARMC ONLY): Fibrin derivatives D-dimer (ARMC): 873.65 ng/mL (FEU) — ABNORMAL HIGH (ref 0.00–499.00)

## 2020-07-03 NOTE — Telephone Encounter (Signed)
Left VM for patient to see if she could come and get her labs completed again prior to her 2/16 CT scan. Pt did not answer, so a VM was left for patient to contact the office in the morning to schedule if that was durable.

## 2020-07-04 ENCOUNTER — Inpatient Hospital Stay: Payer: Medicare Other

## 2020-07-04 ENCOUNTER — Ambulatory Visit: Payer: Medicare Other

## 2020-07-04 ENCOUNTER — Inpatient Hospital Stay (HOSPITAL_BASED_OUTPATIENT_CLINIC_OR_DEPARTMENT_OTHER): Payer: Medicare Other | Admitting: Oncology

## 2020-07-04 ENCOUNTER — Other Ambulatory Visit: Payer: Self-pay

## 2020-07-04 DIAGNOSIS — D509 Iron deficiency anemia, unspecified: Secondary | ICD-10-CM

## 2020-07-04 LAB — CBC WITH DIFFERENTIAL/PLATELET
Abs Immature Granulocytes: 0.02 10*3/uL (ref 0.00–0.07)
Basophils Absolute: 0 10*3/uL (ref 0.0–0.1)
Basophils Relative: 0 %
Eosinophils Absolute: 0.1 10*3/uL (ref 0.0–0.5)
Eosinophils Relative: 1 %
HCT: 35.3 % — ABNORMAL LOW (ref 36.0–46.0)
Hemoglobin: 11.4 g/dL — ABNORMAL LOW (ref 12.0–15.0)
Immature Granulocytes: 0 %
Lymphocytes Relative: 21 %
Lymphs Abs: 1.4 10*3/uL (ref 0.7–4.0)
MCH: 29.5 pg (ref 26.0–34.0)
MCHC: 32.3 g/dL (ref 30.0–36.0)
MCV: 91.5 fL (ref 80.0–100.0)
Monocytes Absolute: 0.5 10*3/uL (ref 0.1–1.0)
Monocytes Relative: 7 %
Neutro Abs: 5 10*3/uL (ref 1.7–7.7)
Neutrophils Relative %: 71 %
Platelets: 247 10*3/uL (ref 150–400)
RBC: 3.86 MIL/uL — ABNORMAL LOW (ref 3.87–5.11)
RDW: 16.5 % — ABNORMAL HIGH (ref 11.5–15.5)
WBC: 7 10*3/uL (ref 4.0–10.5)
nRBC: 0 % (ref 0.0–0.2)

## 2020-07-04 LAB — IRON AND TIBC
Iron: 118 ug/dL (ref 28–170)
Saturation Ratios: 29 % (ref 10.4–31.8)
TIBC: 402 ug/dL (ref 250–450)
UIBC: 284 ug/dL

## 2020-07-04 LAB — FERRITIN: Ferritin: 37 ng/mL (ref 11–307)

## 2020-07-04 NOTE — Progress Notes (Signed)
[facility]  [phone] [phone]  ID: Deanna Gay OB: 05-Mar-1956  MR#: [id number]  VOJ#:[id number]  Patient Care Team: Kirk Ruths, MD as PCP - General (Internal Medicine) Lloyd Huger, MD as Consulting Physician (Oncology)  I connected with Deanna Gay on 07/04/20 at  1:45 PM EST by telephone visit and verified that I am speaking with the correct person using two identifiers.   I discussed the limitations, risks, security and privacy concerns of performing an evaluation and management service by telemedicine and the availability of in-person appointments. I also discussed with the patient that there may be a patient responsible charge related to this service. The patient expressed understanding and agreed to proceed.   Other persons participating in the visit and their role in the encounter: Patient, MD  Patient's location: Home Provider's location: Clinic  CHIEF COMPLAINT: Iron deficiency anemia.  INTERVAL HISTORY: Patient agreed to telephone visit for evaluation and discussion of her laboratory work.  She was last seen in clinic on 05/02/2019.  She last received IV Feraheme on 01/08/2018 and 01/15/2018.  She was recently hospitalized from 06/05/2020-06/07/2020 for hypoxia secondary to COVID-19.  She was given IV remdesivir and IV Solu-Medrol.  She left AMA.  She is followed by Pulmonology for COPD. On chronic O2.   She is followed by GI Dr. Alice Reichert for cirrhosis of the liver.   She recently called clinic expressing some concern about being tired and wanting to check her iron levels. States that " I know my levels are low".  She denies any GI bleeding. States she has chronic hematuria but is not followed by a urologist.  She continues to have chronic weakness and fatigue which has worsened since discharge from hospital. She also has persistent shortness of breath that is unchanged. On chronic O2- 2L. She has no  neurologic complaints.  She denies any recent fevers or illnesses. She has no chest pain, cough, or hemoptysis. She denies any nausea, vomiting, constipation, or diarrhea. She has no melena or hematochezia.  She has no urinary complaints.  Patient otherwise feels well and offers no further specific complaints today.   REVIEW OF SYSTEMS:   Review of Systems  Constitutional: Positive for malaise/fatigue. Negative for fever and weight loss.  Respiratory: Positive for shortness of breath. Negative for cough and hemoptysis.   Cardiovascular: Negative.  Negative for chest pain and leg swelling.  Gastrointestinal: Negative.  Negative for abdominal pain, blood in stool and melena.  Genitourinary: Negative.  Negative for frequency and hematuria.  Musculoskeletal: Negative.  Negative for myalgias.  Skin: Negative.  Negative for rash.  Neurological: Positive for weakness. Negative for dizziness, focal weakness and headaches.  Psychiatric/Behavioral: Negative.  The patient is not nervous/anxious.     As per HPI. Otherwise, a complete review of systems is negative.  PAST MEDICAL HISTORY: Past Medical History:  Diagnosis Date  . Anemia   . Arthritis   . Cancer ([place])    cervical  . COPD (chronic obstructive pulmonary disease) ([place])   . COPD exacerbation ([place]) 07/19/2017  . Diabetes mellitus without complication ([place])   . GERD (gastroesophageal reflux disease)   . Hypertension   . Schizophrenia ([place])   . Sleep apnea     PAST SURGICAL HISTORY: Past Surgical History:  Procedure Laterality Date  . APPENDECTOMY    . COLONOSCOPY WITH PROPOFOL N/A 12/25/2014   Procedure: COLONOSCOPY WITH PROPOFOL;  Surgeon: Josefine Class, MD;  Location: [facility] ENDOSCOPY;  Service: Endoscopy;  Laterality: N/A;  . COLONOSCOPY WITH PROPOFOL N/A 01/21/2018   Procedure: COLONOSCOPY WITH PROPOFOL;  Surgeon: Lin Landsman, MD;  Location: [facility] ENDOSCOPY;  Service: Gastroenterology;  Laterality: N/A;  .  ESOPHAGOGASTRODUODENOSCOPY (EGD) WITH PROPOFOL N/A 12/25/2014   Procedure: ESOPHAGOGASTRODUODENOSCOPY (EGD) WITH PROPOFOL;  Surgeon: Josefine Class, MD;  Location: [facility] ENDOSCOPY;  Service: Endoscopy;  Laterality: N/A;  . ESOPHAGOGASTRODUODENOSCOPY (EGD) WITH PROPOFOL N/A 01/21/2018   Procedure: ESOPHAGOGASTRODUODENOSCOPY (EGD) WITH PROPOFOL;  Surgeon: Lin Landsman, MD;  Location: [facility] ENDOSCOPY;  Service: Gastroenterology;  Laterality: N/A;  . GIVENS CAPSULE STUDY N/A 03/02/2018   Procedure: GIVENS CAPSULE STUDY;  Surgeon: Lin Landsman, MD;  Location: [facility] ENDOSCOPY;  Service: Gastroenterology;  Laterality: N/A;  . JOINT REPLACEMENT     right hip  . TOTAL HIP ARTHROPLASTY Left 05/16/2016   Procedure: LEFT TOTAL HIP ARTHROPLASTY ANTERIOR APPROACH;  Surgeon: Mcarthur Rossetti, MD;  Location: [facility] ORS;  Service: Orthopedics;  Laterality: Left;    FAMILY HISTORY Family History  Problem Relation Age of Onset  . Hypertension Mother   . Diabetes Mellitus II Mother   . Breast cancer Neg Hx   . Kidney cancer Neg Hx   . Bladder Cancer Neg Hx        ADVANCED DIRECTIVES:    HEALTH MAINTENANCE: Social History   Tobacco Use  . Smoking status: Current Every Day Smoker    Packs/day: 1.00    Years: 48.00    Pack years: 48.00    Types: Cigarettes  . Smokeless tobacco: Never Used  Vaping Use  . Vaping Use: Never used  Substance Use Topics  . Alcohol use: No  . Drug use: No     Colonoscopy:  PAP:  Bone density:  Lipid panel:  Allergies  Allergen Reactions  . Penicillins Swelling    Has patient had a PCN reaction causing immediate rash, facial/tongue/throat swelling, SOB or lightheadedness with hypotension: Yes Has patient had a PCN reaction causing severe rash involving mucus membranes or skin necrosis: Yes Has patient had a PCN reaction that required hospitalization: No Has patient had a PCN reaction occurring within the last 10 years: No If all of the above  answers are "NO", then may proceed with Cephalosporin use.  Has patient had a PCN reaction causing immediate rash, facial/tongue/throat swelling, SOB or lightheadedness with hypotension: Yes Has patient had a PCN reaction causing severe rash involving mucus membranes or skin necrosis: Yes Has patient had a PCN reaction that required hospitalization: No Has patient had a PCN reaction occurring within the last 10 years: No If all of the above answers are "NO", then may proceed with Cephalosporin use. Has patient had a PCN reaction causing immediate rash, faci  . Aspirin Other (See Comments)    Ongoing anemia  . Iodinated Diagnostic Agents Swelling  . Iodine Swelling    Current Outpatient Medications  Medication Sig Dispense Refill  . ACCU-CHEK AVIVA PLUS test strip use 1 TEST STRIP to TEST BLOOD SUGAR four times a day  0  . albuterol (PROVENTIL HFA;VENTOLIN HFA) 108 (90 BASE) MCG/ACT inhaler Inhale 2 puffs into the lungs every 4 (four) hours as needed for wheezing or shortness of breath.     . Blood Glucose Monitoring Suppl (ACCU-CHEK AVIVA PLUS) w/Device KIT See admin instructions.  0  . budesonide-formoterol (SYMBICORT) 160-4.5 MCG/ACT inhaler Inhale 2 puffs into the lungs 2 (two) times daily.    Marland Kitchen diltiazem (CARDIZEM CD) 180 MG 24 hr capsule Take 180 mg by mouth  at bedtime.     Marland Kitchen escitalopram (LEXAPRO) 10 MG tablet Take 10 mg by mouth daily.    . furosemide (LASIX) 20 MG tablet Take 20 mg by mouth daily.    . hydrochlorothiazide (HYDRODIURIL) 25 MG tablet take 1 tablet by mouth every morning    . insulin glargine (LANTUS) 100 UNIT/ML injection Inject 0.1 mLs (10 Units total) into the skin at bedtime. 10 mL 11  . insulin lispro (HUMALOG) 100 UNIT/ML injection Please check your sugars before each meal and use lispro per the below sliding scale:  For sugars 151-200: take 2 units For 201-250: take 4units For 251-300: take 6 units For 301-350: take 8 units For 351-400: take 10 units For >  401: call MD 10 mL 11  . Insulin Syringe-Needle U-100 (BD INSULIN SYRINGE ULTRAFINE) 31G X 5/16" 0.5 ML MISC use as directed once daily    . ipratropium-albuterol (DUONEB) 0.5-2.5 (3) MG/3ML SOLN Take 3 mLs by nebulization every 6 (six) hours as needed.    Marland Kitchen losartan (COZAAR) 50 MG tablet Take 50 mg by mouth daily.    . meloxicam (MOBIC) 15 MG tablet Take 15 mg by mouth daily.    . metoprolol succinate (TOPROL-XL) 25 MG 24 hr tablet Take 25 mg by mouth daily.    . montelukast (SINGULAIR) 10 MG tablet Take 10 mg by mouth at bedtime.    . nitroGLYCERIN (NITROSTAT) 0.4 MG SL tablet Place 1 tablet under the tongue every 5 (five) minutes as needed.  0  . omeprazole (PRILOSEC) 20 MG capsule Take 20 mg by mouth 2 (two) times daily.    Marland Kitchen oxyCODONE-acetaminophen (PERCOCET) 7.5-325 MG tablet Take 1-2 tablets by mouth every 6 (six) hours as needed for severe pain. 30 tablet 0  . predniSONE (DELTASONE) 10 MG tablet Label  & dispense according to the schedule below. 5 Pills PO for 1 day then, 4 Pills PO for 1 day, 3 Pills PO for 1 day, 2 Pills PO for 1 day, 1 Pill PO for 1 days then STOP. 15 tablet 0   No current facility-administered medications for this visit.    OBJECTIVE: There were no vitals filed for this visit.   There is no height or weight on file to calculate BMI.    ECOG FS:0 - Asymptomatic   LAB RESULTS:  Lab Results  Component Value Date   NA 138 07/03/2020   K 4.4 07/03/2020   CL 96 (L) 07/03/2020   CO2 31 07/03/2020   GLUCOSE 114 (H) 07/03/2020   BUN 17 07/03/2020   CREATININE 1.31 (H) 07/03/2020   CALCIUM 8.6 (L) 07/03/2020   PROT 7.6 06/06/2020   ALBUMIN 2.8 (L) 06/06/2020   AST 13 (L) 06/06/2020   ALT 17 06/06/2020   ALKPHOS 70 06/06/2020   BILITOT 0.5 06/06/2020   GFRNONAA 45 (L) 07/03/2020   GFRAA 49 (L) 10/31/2019    Lab Results  Component Value Date   WBC 7.0 07/04/2020   NEUTROABS 5.0 07/04/2020   HGB 11.4 (L) 07/04/2020   HCT 35.3 (L) 07/04/2020   MCV 91.5  07/04/2020   PLT 247 07/04/2020   Lab Results  Component Value Date   FERRITIN 37 07/04/2020   Lab Results  Component Value Date   IRON 118 07/04/2020   TIBC 402 07/04/2020   IRONPCTSAT 29 07/04/2020      STUDIES: DG Chest Portable 1 View  Result Date: 06/05/2020 CLINICAL DATA:  Shortness of breath with COVID pneumonia. EXAM: PORTABLE CHEST 1  VIEW COMPARISON:  02/19/2019 FINDINGS: The heart size is significantly enlarged. There is no pneumothorax or large pleural effusion. Bilateral hazy focal infiltrates are noted. The lungs are hyperexpanded. There are probable trace bilateral pleural effusions. Aortic calcifications are noted. IMPRESSION: Cardiomegaly with bilateral hazy focal infiltrates consistent with multifocal pneumonia (viral or bacterial). Electronically Signed   By: Constance Holster M.D.   On: 06/05/2020 00:30    ASSESSMENT: Iron deficiency anemia.  PLAN:    1. Iron deficiency anemia:  -Last received iron in 2019 -Denies any Gi bleeding -Admits to chronic hematuria  -Last colonoscopy is from 01/15/18- no evidence of bleeding -Labs from 07/04/20 show a hemoglobin 11.4, ferritin 37 and iron saturation 29% -Given she is symptomatic and she is anemic, would recommend 2 doses of IV iron. -She will return to clinic in approximately 6 months for repeat labs and possible IV iron.  2.  Hypoxia secondary to COVID-19: -Hospitalized from 06/05/2020-06/07/2020 for oxygen saturations in the 60's. -Received IV remdesivir and Solu-Medrol. -Ultimately left AMA.   -On chronic O2. -She continues to slowly recover.  Disposition: -RTC in 6 months for repeat labs (ferritin, iron, cbc) and assessment.   I provided 15 minutes of non face-to-face telephone visit time during this encounter, and > 50% was spent counseling as documented under my assessment & plan.   Patient expressed understanding and was in agreement with this plan. She also understands that She can call clinic at any  time with any questions, concerns, or complaints.    Jacquelin Hawking, NP   07/04/2020 1:50 PM

## 2020-07-05 ENCOUNTER — Ambulatory Visit
Admission: RE | Admit: 2020-07-05 | Discharge: 2020-07-05 | Disposition: A | Discharge status: Home / Self Care | Payer: Medicare Other | Source: Ambulatory Visit | Attending: Specialist | Admitting: Specialist

## 2020-07-05 ENCOUNTER — Other Ambulatory Visit: Payer: Self-pay

## 2020-07-05 ENCOUNTER — Inpatient Hospital Stay: Payer: Medicare Other

## 2020-07-05 VITALS — BP 137/72 | HR 99 | Temp 98.2°F | Resp 16

## 2020-07-05 DIAGNOSIS — R0602 Shortness of breath: Secondary | ICD-10-CM | POA: Diagnosis present

## 2020-07-05 DIAGNOSIS — D509 Iron deficiency anemia, unspecified: Secondary | ICD-10-CM

## 2020-07-05 DIAGNOSIS — R7989 Other specified abnormal findings of blood chemistry: Secondary | ICD-10-CM | POA: Diagnosis present

## 2020-07-05 MED ORDER — IOHEXOL 350 MG/ML SOLN
60.0000 mL | Freq: Once | INTRAVENOUS | Status: AC | PRN
  Administered 2020-07-05: 60 mL via INTRAVENOUS

## 2020-07-05 MED ORDER — SODIUM CHLORIDE 0.9 % IV SOLN
Freq: Once | INTRAVENOUS | Status: AC
  Filled 2020-07-05: qty 250

## 2020-07-05 MED ORDER — SODIUM CHLORIDE 0.9 % IV SOLN
510.0000 mg | Freq: Once | INTRAVENOUS | Status: AC
  Administered 2020-07-05: 510 mg via INTRAVENOUS
  Filled 2020-07-05: qty 510

## 2020-07-05 NOTE — Progress Notes (Signed)
Patient monitored x 30 minutes post Feraheme infusion. Tolerated well.

## 2020-07-23 ENCOUNTER — Other Ambulatory Visit: Payer: Self-pay | Admitting: Oncology

## 2020-07-27 ENCOUNTER — Other Ambulatory Visit: Payer: Self-pay | Admitting: Anesthesiology

## 2020-07-27 DIAGNOSIS — M5030 Other cervical disc degeneration, unspecified cervical region: Secondary | ICD-10-CM

## 2020-07-27 DIAGNOSIS — M545 Low back pain, unspecified: Secondary | ICD-10-CM

## 2020-07-27 DIAGNOSIS — M542 Cervicalgia: Secondary | ICD-10-CM

## 2020-07-27 DIAGNOSIS — M4327 Fusion of spine, lumbosacral region: Secondary | ICD-10-CM

## 2020-07-27 DIAGNOSIS — M5136 Other intervertebral disc degeneration, lumbar region: Secondary | ICD-10-CM

## 2020-08-18 ENCOUNTER — Ambulatory Visit
Admission: RE | Admit: 2020-08-18 | Discharge: 2020-08-18 | Disposition: A | Discharge status: Home / Self Care | Payer: Medicare Other | Source: Ambulatory Visit | Attending: Anesthesiology | Admitting: Anesthesiology

## 2020-08-18 ENCOUNTER — Other Ambulatory Visit: Payer: Self-pay

## 2020-08-18 DIAGNOSIS — M5030 Other cervical disc degeneration, unspecified cervical region: Secondary | ICD-10-CM

## 2020-08-18 DIAGNOSIS — M4327 Fusion of spine, lumbosacral region: Secondary | ICD-10-CM

## 2020-08-18 DIAGNOSIS — M545 Low back pain, unspecified: Secondary | ICD-10-CM

## 2020-08-18 DIAGNOSIS — M542 Cervicalgia: Secondary | ICD-10-CM

## 2020-08-18 DIAGNOSIS — M5136 Other intervertebral disc degeneration, lumbar region: Secondary | ICD-10-CM

## 2020-10-02 ENCOUNTER — Telehealth: Payer: Self-pay | Admitting: *Deleted

## 2020-10-02 ENCOUNTER — Other Ambulatory Visit: Payer: Self-pay | Admitting: Internal Medicine

## 2020-10-02 DIAGNOSIS — D509 Iron deficiency anemia, unspecified: Secondary | ICD-10-CM

## 2020-10-02 DIAGNOSIS — Z1231 Encounter for screening mammogram for malignant neoplasm of breast: Secondary | ICD-10-CM

## 2020-10-02 NOTE — Telephone Encounter (Signed)
Patient wants to come in at 8 AM because she has appointment at [facility] at 730. Appointment made for 8 AM

## 2020-10-02 NOTE — Telephone Encounter (Signed)
Patient called stating that she feels she needs a blood transfusion or Iron infusion. Her last visit was video on 07/04/20 and she has no future follow up, but per office note from 2/16, she is to follow up in 6 months. She is feeling weak. Please advise

## 2020-10-03 ENCOUNTER — Inpatient Hospital Stay: Payer: Medicare Other | Attending: Oncology

## 2020-10-03 ENCOUNTER — Other Ambulatory Visit: Payer: Self-pay

## 2020-10-03 DIAGNOSIS — D509 Iron deficiency anemia, unspecified: Secondary | ICD-10-CM | POA: Diagnosis present

## 2020-10-03 LAB — CBC WITH DIFFERENTIAL/PLATELET
Abs Immature Granulocytes: 0.02 10*3/uL (ref 0.00–0.07)
Basophils Absolute: 0 10*3/uL (ref 0.0–0.1)
Basophils Relative: 1 %
Eosinophils Absolute: 0.1 10*3/uL (ref 0.0–0.5)
Eosinophils Relative: 2 %
HCT: 40.5 % (ref 36.0–46.0)
Hemoglobin: 12.9 g/dL (ref 12.0–15.0)
Immature Granulocytes: 0 %
Lymphocytes Relative: 29 %
Lymphs Abs: 1.8 10*3/uL (ref 0.7–4.0)
MCH: 28.3 pg (ref 26.0–34.0)
MCHC: 31.9 g/dL (ref 30.0–36.0)
MCV: 88.8 fL (ref 80.0–100.0)
Monocytes Absolute: 0.5 10*3/uL (ref 0.1–1.0)
Monocytes Relative: 9 %
Neutro Abs: 3.7 10*3/uL (ref 1.7–7.7)
Neutrophils Relative %: 59 %
Platelets: 250 10*3/uL (ref 150–400)
RBC: 4.56 MIL/uL (ref 3.87–5.11)
RDW: 13.5 % (ref 11.5–15.5)
WBC: 6.2 10*3/uL (ref 4.0–10.5)
nRBC: 0 % (ref 0.0–0.2)

## 2020-10-03 LAB — IRON AND TIBC
Iron: 52 ug/dL (ref 28–170)
Saturation Ratios: 16 % (ref 10.4–31.8)
TIBC: 329 ug/dL (ref 250–450)
UIBC: 277 ug/dL

## 2020-10-03 LAB — SAMPLE TO BLOOD BANK

## 2020-10-03 LAB — FERRITIN: Ferritin: 86 ng/mL (ref 11–307)

## 2020-10-04 ENCOUNTER — Encounter: Payer: Self-pay | Admitting: *Deleted

## 2020-10-08 ENCOUNTER — Ambulatory Visit
Admission: RE | Admit: 2020-10-08 | Discharge: 2020-10-08 | Disposition: A | Discharge status: Home / Self Care | Payer: Medicare Other | Source: Ambulatory Visit | Attending: Internal Medicine | Admitting: Internal Medicine

## 2020-10-08 ENCOUNTER — Other Ambulatory Visit: Payer: Self-pay

## 2020-10-08 DIAGNOSIS — Z1231 Encounter for screening mammogram for malignant neoplasm of breast: Secondary | ICD-10-CM | POA: Insufficient documentation

## 2020-12-07 ENCOUNTER — Other Ambulatory Visit: Payer: Self-pay | Admitting: Oncology

## 2021-01-07 ENCOUNTER — Other Ambulatory Visit: Payer: Self-pay | Admitting: Gastroenterology

## 2021-01-23 ENCOUNTER — Encounter: Payer: Self-pay | Admitting: *Deleted

## 2021-01-24 ENCOUNTER — Ambulatory Visit: Payer: Medicare Other | Admitting: Anesthesiology

## 2021-01-24 ENCOUNTER — Other Ambulatory Visit: Payer: Self-pay

## 2021-01-24 ENCOUNTER — Encounter: Payer: Self-pay | Admitting: Anesthesiology

## 2021-01-24 ENCOUNTER — Ambulatory Visit
Admission: RE | Admit: 2021-01-24 | Discharge: 2021-01-24 | Disposition: A | Discharge status: Home / Self Care | Payer: Medicare Other | Source: Ambulatory Visit | Attending: Gastroenterology | Admitting: Gastroenterology

## 2021-01-24 ENCOUNTER — Encounter
Admission: RE | Disposition: A | Discharge status: Home / Self Care | Payer: Self-pay | Source: Ambulatory Visit | Attending: Gastroenterology

## 2021-01-24 DIAGNOSIS — Z888 Allergy status to other drugs, medicaments and biological substances status: Secondary | ICD-10-CM | POA: Diagnosis not present

## 2021-01-24 DIAGNOSIS — D509 Iron deficiency anemia, unspecified: Secondary | ICD-10-CM | POA: Insufficient documentation

## 2021-01-24 DIAGNOSIS — K621 Rectal polyp: Secondary | ICD-10-CM | POA: Insufficient documentation

## 2021-01-24 DIAGNOSIS — K5909 Other constipation: Secondary | ICD-10-CM | POA: Insufficient documentation

## 2021-01-24 DIAGNOSIS — K449 Diaphragmatic hernia without obstruction or gangrene: Secondary | ICD-10-CM | POA: Insufficient documentation

## 2021-01-24 DIAGNOSIS — Z7952 Long term (current) use of systemic steroids: Secondary | ICD-10-CM | POA: Diagnosis not present

## 2021-01-24 DIAGNOSIS — Z886 Allergy status to analgesic agent status: Secondary | ICD-10-CM | POA: Insufficient documentation

## 2021-01-24 DIAGNOSIS — K5730 Diverticulosis of large intestine without perforation or abscess without bleeding: Secondary | ICD-10-CM | POA: Diagnosis not present

## 2021-01-24 DIAGNOSIS — Z79891 Long term (current) use of opiate analgesic: Secondary | ICD-10-CM | POA: Insufficient documentation

## 2021-01-24 DIAGNOSIS — K7581 Nonalcoholic steatohepatitis (NASH): Secondary | ICD-10-CM | POA: Diagnosis not present

## 2021-01-24 DIAGNOSIS — Z7951 Long term (current) use of inhaled steroids: Secondary | ICD-10-CM | POA: Insufficient documentation

## 2021-01-24 DIAGNOSIS — Z6833 Body mass index (BMI) 33.0-33.9, adult: Secondary | ICD-10-CM | POA: Diagnosis not present

## 2021-01-24 DIAGNOSIS — Z794 Long term (current) use of insulin: Secondary | ICD-10-CM | POA: Diagnosis not present

## 2021-01-24 DIAGNOSIS — R634 Abnormal weight loss: Secondary | ICD-10-CM | POA: Insufficient documentation

## 2021-01-24 DIAGNOSIS — R1084 Generalized abdominal pain: Secondary | ICD-10-CM | POA: Insufficient documentation

## 2021-01-24 DIAGNOSIS — K219 Gastro-esophageal reflux disease without esophagitis: Secondary | ICD-10-CM | POA: Insufficient documentation

## 2021-01-24 DIAGNOSIS — Z91041 Radiographic dye allergy status: Secondary | ICD-10-CM | POA: Insufficient documentation

## 2021-01-24 DIAGNOSIS — K640 First degree hemorrhoids: Secondary | ICD-10-CM | POA: Insufficient documentation

## 2021-01-24 DIAGNOSIS — K319 Disease of stomach and duodenum, unspecified: Secondary | ICD-10-CM | POA: Insufficient documentation

## 2021-01-24 DIAGNOSIS — Z880 Allergy status to penicillin: Secondary | ICD-10-CM | POA: Insufficient documentation

## 2021-01-24 DIAGNOSIS — Z79899 Other long term (current) drug therapy: Secondary | ICD-10-CM | POA: Insufficient documentation

## 2021-01-24 DIAGNOSIS — E119 Type 2 diabetes mellitus without complications: Secondary | ICD-10-CM | POA: Diagnosis not present

## 2021-01-24 DIAGNOSIS — D120 Benign neoplasm of cecum: Secondary | ICD-10-CM | POA: Insufficient documentation

## 2021-01-24 DIAGNOSIS — K2970 Gastritis, unspecified, without bleeding: Secondary | ICD-10-CM | POA: Insufficient documentation

## 2021-01-24 DIAGNOSIS — D124 Benign neoplasm of descending colon: Secondary | ICD-10-CM | POA: Diagnosis not present

## 2021-01-24 DIAGNOSIS — Z791 Long term (current) use of non-steroidal anti-inflammatories (NSAID): Secondary | ICD-10-CM | POA: Insufficient documentation

## 2021-01-24 HISTORY — PX: COLONOSCOPY: SHX5424

## 2021-01-24 HISTORY — PX: ESOPHAGOGASTRODUODENOSCOPY: SHX5428

## 2021-01-24 SURGERY — COLONOSCOPY
Anesthesia: General

## 2021-01-24 MED ORDER — PROPOFOL 500 MG/50ML IV EMUL
INTRAVENOUS | Status: AC
  Filled 2021-01-24: qty 50

## 2021-01-24 MED ORDER — LIDOCAINE HCL (PF) 2 % IJ SOLN
INTRAMUSCULAR | Status: AC
  Filled 2021-01-24: qty 5

## 2021-01-24 MED ORDER — PROPOFOL 500 MG/50ML IV EMUL
INTRAVENOUS | Status: DC | PRN
  Administered 2021-01-24: 150 ug/kg/min via INTRAVENOUS

## 2021-01-24 MED ORDER — SODIUM CHLORIDE 0.9 % IV SOLN: INTRAVENOUS | Status: DC

## 2021-01-24 MED ORDER — LIDOCAINE HCL (CARDIAC) PF 100 MG/5ML IV SOSY
PREFILLED_SYRINGE | INTRAVENOUS | Status: DC | PRN
  Administered 2021-01-24: 50 mg via INTRAVENOUS

## 2021-01-24 MED ORDER — PHENYLEPHRINE HCL (PRESSORS) 10 MG/ML IV SOLN
INTRAVENOUS | Status: DC | PRN
  Administered 2021-01-24 (×4): 50 ug via INTRAVENOUS

## 2021-01-24 NOTE — Interval H&P Note (Signed)
History and Physical Interval Note: Preprocedure H&P from 01/24/21  was reviewed and there was no interval change after seeing and examining the patient.  Written consent was obtained from the patient after discussion of risks, benefits, and alternatives. Patient has consented to proceed with Esophagogastroduodenoscopy and Colonoscopy with possible intervention   01/24/2021 9:34 AM  Macenzie B Mcdaniel  has presented today for surgery, with the diagnosis of (K74.60) CIRRHOSIS OF LIVER NOT DUE TO ALCOHOL (D50.9) IRON DEFICENCY ANEMIA (R10.84) GENERALIZED POSPRANDIAL ABDOMINAL PAIN  (Z86.010) HIS OF ADENOMATOUS COLONOIC POLYPS (R11.0) NAUSEA.  The various methods of treatment have been discussed with the patient and family. After consideration of risks, benefits and other options for treatment, the patient has consented to  Procedure(s): COLONOSCOPY (N/A) ESOPHAGOGASTRODUODENOSCOPY (EGD) (N/A) as a surgical intervention.  The patient's history has been reviewed, patient examined, no change in status, stable for surgery.  I have reviewed the patient's chart and labs.  Questions were answered to the patient's satisfaction.     Annamaria Helling

## 2021-01-24 NOTE — Op Note (Addendum)
[facility]astroenterology Patient Name: Esha Fincher Procedure Date: 01/24/2021 9:17 AM MRN: [id number] Account #: [id number] Date of Birth: 06-28-1955 Admit Type: Outpatient Age: 65 Room: [facility] ENDO ROOM 1 Gender: Female Note Status: Supervisor Override Instrument Name: Park Meo [id number] Procedure:             Colonoscopy Indications:           High risk colon cancer surveillance: Personal history                         of colonic polyps, Generalized abdominal pain, Iron                         deficiency anemia Providers:             Rueben Bash, DO Referring MD:          Ocie Cornfield. Ouida Sills MD, MD (Referring MD) Medicines:             Monitored Anesthesia Care Complications:         No immediate complications. Estimated blood loss:                         Minimal. Procedure:             Pre-Anesthesia Assessment:                        - Prior to the procedure, a History and Physical was                         performed, and patient medications and allergies were                         reviewed. The patient is competent. The risks and                         benefits of the procedure and the sedation options and                         risks were discussed with the patient. All questions                         were answered and informed consent was obtained.                         Patient identification and proposed procedure were                         verified by the physician, the nurse, the anesthetist                         and the technician in the endoscopy suite. Mental                         Status Examination: normal. Airway Examination: normal                         oropharyngeal airway and neck mobility. Respiratory  Examination: clear to auscultation. CV Examination:                         RRR, no murmurs, no S3 or S4. Prophylactic                         Antibiotics: The patient does not require  prophylactic                         antibiotics. Prior Anticoagulants: The patient has                         taken no previous anticoagulant or antiplatelet                         agents. ASA Grade Assessment: III - A patient with                         severe systemic disease. After reviewing the risks and                         benefits, the patient was deemed in satisfactory                         condition to undergo the procedure. The anesthesia                         plan was to use monitored anesthesia care (MAC).                         Immediately prior to administration of medications,                         the patient was re-assessed for adequacy to receive                         sedatives. The heart rate, respiratory rate, oxygen                         saturations, blood pressure, adequacy of pulmonary                         ventilation, and response to care were monitored                         throughout the procedure. The physical status of the                         patient was re-assessed after the procedure.                        After obtaining informed consent, the colonoscope was                         passed under direct vision. Throughout the procedure,                         the patient's blood pressure, pulse, and oxygen  saturations were monitored continuously. The                         Colonoscope was introduced through the anus and                         advanced to the the cecum, identified by appendiceal                         orifice and ileocecal valve. The colonoscopy was                         performed without difficulty. The patient tolerated                         the procedure well. The quality of the bowel                         preparation was evaluated using the BBPS [facility] Bowel                         Preparation Scale) with scores of: Right Colon = 2                         (minor amount of residual  staining, small fragments of                         stool and/or opaque liquid, but mucosa seen well),                         Transverse Colon = 2 (minor amount of residual                         staining, small fragments of stool and/or opaque                         liquid, but mucosa seen well) and Left Colon = 3                         (entire mucosa seen well with no residual staining,                         small fragments of stool or opaque liquid). The total                         BBPS score equals 7. The quality of the bowel                         preparation was good. The ileocecal valve, appendiceal                         orifice, and rectum were photographed. Findings:      The perianal and digital rectal examinations were normal. Pertinent       negatives include normal sphincter tone.      Multiple small-mouthed diverticula were found in the left colon.       Estimated blood loss: none.  Non-bleeding internal hemorrhoids were found during retroflexion. The       hemorrhoids were Grade I (internal hemorrhoids that do not prolapse).       Estimated blood loss: none.      A 8 to 10 mm polyp was found in the cecum. The polyp was flat. Area was       successfully injected with 2 mL Eleview for a lift polypectomy. The       polyp was removed with a cold snare. Resection and retrieval were       complete. Estimated blood loss was minimal.      Two sessile polyps were found in the descending colon. The polyps were 2       to 3 mm in size. These polyps were removed with a cold biopsy forceps.       Resection and retrieval were complete. Estimated blood loss was minimal.      A 7 to 8 mm polyp was found in the descending colon. The polyp was flat.       The polyp was removed with a cold snare. Resection and retrieval were       complete. Estimated blood loss was minimal.      A 7 to 9 mm polyp was found in the rectum. The polyp was flat. Area was       successfully  injected with 1 mL Eleview for lesion assessment, and this       injection appeared to lift the lesion adequately. Estimated blood loss       was minimal. The polyp was removed with a cold snare. Resection and       retrieval were complete. Estimated blood loss was minimal.      The exam was otherwise without abnormality on direct and retroflexion       views. Impression:            - Diverticulosis in the left colon.                        - Non-bleeding internal hemorrhoids.                        - One 8 to 10 mm polyp in the cecum, removed with a                         cold snare. Resected and retrieved. Injected.                        - Two 2 to 3 mm polyps in the descending colon,                         removed with a cold biopsy forceps. Resected and                         retrieved.                        - One 7 to 8 mm polyp in the descending colon, removed                         with a cold snare. Resected and retrieved.                        -  One 7 to 9 mm polyp in the rectum, removed with a                         cold snare. Resected and retrieved. Injected.                        - The examination was otherwise normal on direct and                         retroflexion views. Recommendation:        - Discharge patient to home.                        - Resume previous diet.                        - No aspirin, ibuprofen, naproxen, or other                         non-steroidal anti-inflammatory drugs for 2 days after                         polyp removal.                        - Await pathology results.                        - Repeat colonoscopy for surveillance based on                         pathology results.                        - Return to referring physician as previously                         scheduled. Procedure Code(s):     --- Professional ---                        [phone], Colonoscopy, flexible; with removal of                         tumor(s),  polyp(s), or other lesion(s) by snare                         technique                        45381, Colonoscopy, flexible; with directed submucosal                         injection(s), any substance                        41287, 59, Colonoscopy, flexible; with biopsy, single                         or multiple Diagnosis Code(s):     --- Professional ---  Z86.010, Personal history of colonic polyps                        K64.0, First degree hemorrhoids                        K63.5, Polyp of colon                        K62.1, Rectal polyp                        K57.30, Diverticulosis of large intestine without                         perforation or abscess without bleeding CPT copyright 2019 American Medical Association. All rights reserved. The codes documented in this report are preliminary and upon coder review may  be revised to meet current compliance requirements. Attending Participation:      I personally performed the entire procedure. Volney American, DO Annamaria Helling DO, DO 01/24/2021 11:01:07 AM This report has been signed electronically. Number of Addenda: 0 Note Initiated On: 01/24/2021 9:17 AM Scope Withdrawal Time: 0 hours 34 minutes 20 seconds  Total Procedure Duration: 0 hours 39 minutes 53 seconds  Estimated Blood Loss:  Estimated blood loss was minimal.      [facility]

## 2021-01-24 NOTE — Anesthesia Procedure Notes (Signed)
Date/Time: 01/24/2021 9:45 AM Performed by: Vaughan Sine Pre-anesthesia Checklist: Patient identified, Emergency Drugs available, Suction available, Timeout performed and Patient being monitored Patient Re-evaluated:Patient Re-evaluated prior to induction Oxygen Delivery Method: Simple face mask Preoxygenation: Pre-oxygenation with 100% oxygen Induction Type: IV induction Airway Equipment and Method: Bite block Placement Confirmation: positive ETCO2 and CO2 detector

## 2021-01-24 NOTE — Anesthesia Preprocedure Evaluation (Addendum)
Anesthesia Evaluation  Patient identified by MRN, date of birth, ID band Patient awake    Reviewed: Allergy & Precautions, NPO status , Patient's Chart, lab work & pertinent test results, reviewed documented beta blocker date and time   History of Anesthesia Complications Negative for: history of anesthetic complications  Airway Mallampati: III       Dental  (+) Edentulous Upper, Edentulous Lower   Pulmonary sleep apnea, Continuous Positive Airway Pressure Ventilation and Oxygen sleep apnea , COPD (On chronic O2),  COPD inhaler and oxygen dependent, Current SmokerPatient did not abstain from smoking.,    Pulmonary exam normal        Cardiovascular Exercise Tolerance: Poor hypertension, Pt. on medications (-) Past MI and (-) CHF Normal cardiovascular exam(-) dysrhythmias (-) Valvular Problems/Murmurs     Neuro/Psych neg Seizures Depression Schizophrenia    GI/Hepatic GERD  Medicated and Controlled,(+) Cirrhosis  (NOT DUE TO ALCOHOL)      ,  IRON DEFICENCY ANEMIA  GENERALIZED POSPRANDIAL ABDOMINAL PAIN  HIS OF ADENOMATOUS COLONOIC POLYPS     Endo/Other  diabetes, Type 2, Oral Hypoglycemic Agents, Insulin Dependent  Renal/GU Renal InsufficiencyRenal disease     Musculoskeletal   Abdominal (+) + obese,   Peds  Hematology  (+) anemia ,   Anesthesia Other Findings   Reproductive/Obstetrics                          Anesthesia Physical  Anesthesia Plan  ASA: III  Anesthesia Plan: General   Post-op Pain Management:    Induction: Intravenous  PONV Risk Score and Plan: 2 and Propofol infusion and TIVA  Airway Management Planned: Nasal Cannula  Additional Equipment:   Intra-op Plan:   Post-operative Plan:   Informed Consent: I have reviewed the patients History and Physical, chart, labs and discussed the procedure including the risks, benefits and alternatives for the proposed  anesthesia with the patient or authorized representative who has indicated his/her understanding and acceptance.     Dental advisory given  Plan Discussed with: CRNA and Anesthesiologist  Anesthesia Plan Comments:        Anesthesia Quick Evaluation

## 2021-01-24 NOTE — Anesthesia Postprocedure Evaluation (Signed)
Anesthesia Post Note  Patient: Deanna Gay  Procedure(s) Performed: COLONOSCOPY ESOPHAGOGASTRODUODENOSCOPY (EGD)  Patient location during evaluation: Endoscopy Anesthesia Type: General Level of consciousness: awake and alert Pain management: pain level controlled Vital Signs Assessment: post-procedure vital signs reviewed and stable Respiratory status: spontaneous breathing, nonlabored ventilation and respiratory function stable Cardiovascular status: blood pressure returned to baseline and stable Postop Assessment: no apparent nausea or vomiting Anesthetic complications: no   No notable events documented.   Last Vitals:  Vitals:   01/24/21 1056 01/24/21 1101  BP:  116/73  Pulse: 95 95  Resp: (!) 28 (!) 26  Temp:    SpO2: 96% 93%    Last Pain:  Vitals:   01/24/21 1051  TempSrc:   PainSc: 0-No pain                 Iran Ouch

## 2021-01-24 NOTE — H&P (Signed)
[facility] Pre-Procedure H&P   Patient ID: Deanna Gay is a 65 y.o. female.  Gastroenterology Provider: Annamaria Helling, DO  Referring Provider: Octavia Bruckner, PA PCP: Kirk Ruths, MD  Date: 01/24/2021  HPI Deanna Gay is a 65 y.o. female who presents today for Esophagogastroduodenoscopy and Colonoscopy for cirrhosis, esophageal varices screening, weight loss, nausea, iron deficiency anemia, personal history of polyps..  Patient with cirrhosis 2/2 NASH undergoing egd for esophageal varices screening. Has noted nausea, weight loss, and post prandial abdominal discomfort. No dysphagia, odynophagia, melena, hematochezia. She is iron deficient receiving iv iron infusions. Deals with chronic constipation on chronic opioids. Reports down 31 lbs since November 2021. Has not had much of an appetite, but not necessarily early satiety.  Has personal history of colon polyps- last colonoscopy 2019 with six tubular adenomas.  + tobacco. Appendectomy and ex lap.  No other acute GI complaints.  Past Medical History:  Diagnosis Date   Anemia    Arthritis    Cancer ([place])    cervical   COPD (chronic obstructive pulmonary disease) (HCC)    COPD exacerbation ([place]) 07/19/2017   Depression    Diabetes mellitus without complication (HCC)    GERD (gastroesophageal reflux disease)    Hypertension    Schizophrenia ([place])    Sleep apnea     Past Surgical History:  Procedure Laterality Date   APPENDECTOMY     COLONOSCOPY WITH PROPOFOL N/A 12/25/2014   Procedure: COLONOSCOPY WITH PROPOFOL;  Surgeon: Josefine Class, MD;  Location: [facility] ENDOSCOPY;  Service: Endoscopy;  Laterality: N/A;   COLONOSCOPY WITH PROPOFOL N/A 01/21/2018   Procedure: COLONOSCOPY WITH PROPOFOL;  Surgeon: Lin Landsman, MD;  Location: [facility] ENDOSCOPY;  Service: Gastroenterology;  Laterality: N/A;   ESOPHAGOGASTRODUODENOSCOPY (EGD) WITH PROPOFOL N/A 12/25/2014   Procedure:  ESOPHAGOGASTRODUODENOSCOPY (EGD) WITH PROPOFOL;  Surgeon: Josefine Class, MD;  Location: [facility] ENDOSCOPY;  Service: Endoscopy;  Laterality: N/A;   ESOPHAGOGASTRODUODENOSCOPY (EGD) WITH PROPOFOL N/A 01/21/2018   Procedure: ESOPHAGOGASTRODUODENOSCOPY (EGD) WITH PROPOFOL;  Surgeon: Lin Landsman, MD;  Location: [facility] ENDOSCOPY;  Service: Gastroenterology;  Laterality: N/A;   GIVENS CAPSULE STUDY N/A 03/02/2018   Procedure: GIVENS CAPSULE STUDY;  Surgeon: Lin Landsman, MD;  Location: [facility] ENDOSCOPY;  Service: Gastroenterology;  Laterality: N/A;   JOINT REPLACEMENT     right hip   TOTAL HIP ARTHROPLASTY Left 05/16/2016   Procedure: LEFT TOTAL HIP ARTHROPLASTY ANTERIOR APPROACH;  Surgeon: Mcarthur Rossetti, MD;  Location: [facility] ORS;  Service: Orthopedics;  Laterality: Left;    Family History No h/o GI disease or malignancy  Review of Systems  Constitutional:  Positive for unexpected weight change. Negative for activity change, appetite change, chills, fatigue and fever.  HENT:  Negative for trouble swallowing and voice change.   Respiratory:  Negative for shortness of breath and wheezing.   Cardiovascular:  Negative for chest pain and palpitations.  Gastrointestinal:  Positive for abdominal pain (postprandial) and nausea. Negative for abdominal distention, anal bleeding, blood in stool, constipation, diarrhea, rectal pain and vomiting.  Musculoskeletal:  Negative for arthralgias and myalgias.  Skin:  Negative for color change and pallor.  Neurological:  Negative for dizziness, syncope and weakness.  Psychiatric/Behavioral:  Negative for confusion.   All other systems reviewed and are negative.   Medications No current facility-administered medications on file prior to encounter.   Current Outpatient Medications on File Prior to Encounter  Medication Sig Dispense Refill   albuterol (PROVENTIL HFA;VENTOLIN HFA) 108 (  90 BASE) MCG/ACT inhaler Inhale 2 puffs into the lungs every 4  (four) hours as needed for wheezing or shortness of breath.      atorvastatin (LIPITOR) 40 MG tablet Take 40 mg by mouth daily.     budesonide-formoterol (SYMBICORT) 160-4.5 MCG/ACT inhaler Inhale 2 puffs into the lungs 2 (two) times daily.     calcium carbonate (OSCAL) 1500 (600 Ca) MG TABS tablet Take by mouth 2 (two) times daily with a meal.     diltiazem (CARDIZEM CD) 180 MG 24 hr capsule Take 180 mg by mouth at bedtime.      docusate sodium (COLACE) 100 MG capsule Take 100 mg by mouth 2 (two) times daily.     escitalopram (LEXAPRO) 10 MG tablet Take 10 mg by mouth daily.     FERREX 150 150 MG capsule TAKE 1 CAPSULE BY MOUTH TWICE DAILY 200 capsule 0   fexofenadine (ALLEGRA) 180 MG tablet Take 180 mg by mouth daily.     fluticasone (FLONASE) 50 MCG/ACT nasal spray Place 2 sprays into both nostrils daily.     furosemide (LASIX) 20 MG tablet Take 20 mg by mouth daily.     hydrochlorothiazide (HYDRODIURIL) 25 MG tablet take 1 tablet by mouth every morning     insulin glargine (LANTUS) 100 UNIT/ML injection Inject 0.1 mLs (10 Units total) into the skin at bedtime. 10 mL 11   insulin lispro (HUMALOG) 100 UNIT/ML injection Please check your sugars before each meal and use lispro per the below sliding scale:  For sugars 151-200: take 2 units For 201-250: take 4units For 251-300: take 6 units For 301-350: take 8 units For 351-400: take 10 units For > 401: call MD 10 mL 11   ipratropium-albuterol (DUONEB) 0.5-2.5 (3) MG/3ML SOLN Take 3 mLs by nebulization every 6 (six) hours as needed.     losartan (COZAAR) 50 MG tablet Take 50 mg by mouth daily.     Magnesium 250 MG TABS Take by mouth daily.     meloxicam (MOBIC) 15 MG tablet Take 15 mg by mouth daily.     metoprolol succinate (TOPROL-XL) 25 MG 24 hr tablet Take 25 mg by mouth daily.     montelukast (SINGULAIR) 10 MG tablet Take 10 mg by mouth at bedtime.     Multiple Vitamins-Minerals (ZINC PO) Take by mouth.     Naloxone HCl (EVZIO) 0.4  MG/0.4ML SOAJ Inject as directed.     omeprazole (PRILOSEC) 20 MG capsule Take 20 mg by mouth 2 (two) times daily.     potassium chloride (KLOR-CON) 10 MEQ tablet Take 10 mEq by mouth daily.     predniSONE (DELTASONE) 10 MG tablet Label  & dispense according to the schedule below. 5 Pills PO for 1 day then, 4 Pills PO for 1 day, 3 Pills PO for 1 day, 2 Pills PO for 1 day, 1 Pill PO for 1 days then STOP. 15 tablet 0   pregabalin (LYRICA) 25 MG capsule Take 25 mg by mouth 2 (two) times daily.     roflumilast (DALIRESP) 500 MCG TABS tablet Take 500 mcg by mouth daily.     tiotropium (SPIRIVA) 18 MCG inhalation capsule Place 18 mcg into inhaler and inhale daily.     triamcinolone (KENALOG) 0.1 % paste Use as directed 1 application in the mouth or throat 2 (two) times daily.     vitamin B-12 (CYANOCOBALAMIN) 500 MCG tablet Take 500 mcg by mouth daily.     ACCU-CHEK AVIVA PLUS  test strip use 1 TEST STRIP to TEST BLOOD SUGAR four times a day  0   Blood Glucose Monitoring Suppl (ACCU-CHEK AVIVA PLUS) w/Device KIT See admin instructions.  0   Insulin Syringe-Needle U-100 (BD INSULIN SYRINGE ULTRAFINE) 31G X 5/16" 0.5 ML MISC use as directed once daily     nitroGLYCERIN (NITROSTAT) 0.4 MG SL tablet Place 1 tablet under the tongue every 5 (five) minutes as needed.  0   oxyCODONE-acetaminophen (PERCOCET) 7.5-325 MG tablet Take 1-2 tablets by mouth every 6 (six) hours as needed for severe pain. 30 tablet 0    Pertinent medications related to GI and procedure were reviewed by me with the patient prior to the procedure   Current Facility-Administered Medications:    0.9 %  sodium chloride infusion, , Intravenous, Continuous, Annamaria Helling, DO  sodium chloride         Allergies  Allergen Reactions   Penicillins Swelling    Has patient had a PCN reaction causing immediate rash, facial/tongue/throat swelling, SOB or lightheadedness with hypotension: Yes Has patient had a PCN reaction causing  severe rash involving mucus membranes or skin necrosis: Yes Has patient had a PCN reaction that required hospitalization: No Has patient had a PCN reaction occurring within the last 10 years: No If all of the above answers are "NO", then may proceed with Cephalosporin use.  Has patient had a PCN reaction causing immediate rash, facial/tongue/throat swelling, SOB or lightheadedness with hypotension: Yes Has patient had a PCN reaction causing severe rash involving mucus membranes or skin necrosis: Yes Has patient had a PCN reaction that required hospitalization: No Has patient had a PCN reaction occurring within the last 10 years: No If all of the above answers are "NO", then may proceed with Cephalosporin use. Has patient had a PCN reaction causing immediate rash, faci   Aspirin Other (See Comments)    Ongoing anemia   Iodinated Diagnostic Agents Swelling   Iodine Swelling   Allergies were reviewed by me prior to the procedure  Objective    Vitals:   01/24/21 0902  BP: 129/80  Pulse: 99  Resp: 16  Temp: (!) 97.1 F (36.2 C)  TempSrc: Temporal  SpO2: 96%  Weight: 89.4 kg  Height: _0  (1.626 m)     Physical Exam Vitals and nursing note reviewed.  Constitutional:      General: She is not in acute distress.    Appearance: Normal appearance. She is obese. She is not ill-appearing, toxic-appearing or diaphoretic.  HENT:     Head: Normocephalic and atraumatic.     Nose: Nose normal.     Mouth/Throat:     Mouth: Mucous membranes are moist.     Pharynx: Oropharynx is clear.     Comments: Edentulous Eyes:     General: No scleral icterus.    Extraocular Movements: Extraocular movements intact.  Cardiovascular:     Rate and Rhythm: Normal rate and regular rhythm.     Heart sounds: Normal heart sounds. No murmur heard.   No friction rub. No gallop.  Pulmonary:     Effort: Pulmonary effort is normal. No respiratory distress.     Breath sounds: Normal breath sounds. No  wheezing, rhonchi or rales.  Abdominal:     General: Bowel sounds are normal. There is no distension.     Palpations: Abdomen is soft.     Tenderness: There is no abdominal tenderness. There is no guarding or rebound.  Musculoskeletal:  Cervical back: Neck supple.     Right lower leg: No edema.     Left lower leg: No edema.  Skin:    General: Skin is warm and dry.     Coloration: Skin is not jaundiced or pale.  Neurological:     General: No focal deficit present.     Mental Status: She is alert and oriented to person, place, and time. Mental status is at baseline.  Psychiatric:        Behavior: Behavior normal.        Thought Content: Thought content normal.        Judgment: Judgment normal.     Assessment:  Ms. KARIN PINEDO is a 65 y.o. female  who presents today for Esophagogastroduodenoscopy and Colonoscopy for cirrhosis, esophageal varices screening, weight loss, nausea, iron deficiency anemia, personal history of polyps.  Plan:  Esophagogastroduodenoscopy and Colonoscopy with possible intervention today  Esophagogastroduodenoscopy and colonoscopy with possible biopsy, control of bleeding, polypectomy, and interventions as necessary has been discussed with the patient/patient representative. Informed consent was obtained from the patient/patient representative after explaining the indication, nature, and risks of the procedure including but not limited to death, bleeding, perforation, missed neoplasm/lesions, cardiorespiratory compromise, and reaction to medications. Opportunity for questions was given and appropriate answers were provided. Patient/patient representative has verbalized understanding is amenable to undergoing the procedure.   Annamaria Helling, DO  [facility]  Portions of the record may have been created with voice recognition software. Occasional wrong-word or 'sound-a-like' substitutions may have occurred due to the inherent  limitations of voice recognition software.  Read the chart carefully and recognize, using context, where substitutions may have occurred.

## 2021-01-24 NOTE — Transfer of Care (Signed)
Immediate Anesthesia Transfer of Care Note  Patient: Deanna Gay  Procedure(s) Performed: COLONOSCOPY ESOPHAGOGASTRODUODENOSCOPY (EGD)  Patient Location: PACU  Anesthesia Type:General  Level of Consciousness: awake and sedated  Airway & Oxygen Therapy: Patient Spontanous Breathing and Patient connected to face mask oxygen  Post-op Assessment: Report given to RN and Post -op Vital signs reviewed and stable  Post vital signs: Reviewed and stable  Last Vitals:  Vitals Value Taken Time  BP    Temp    Pulse    Resp    SpO2      Last Pain:  Vitals:   01/24/21 0902  TempSrc: Temporal  PainSc: 0-No pain         Complications: No notable events documented.

## 2021-01-24 NOTE — Progress Notes (Signed)
Patient desatting applied oxygen via nasal cannula at 4L/min

## 2021-01-24 NOTE — Op Note (Addendum)
[facility] Patient Name: Deanna Gay Procedure Date: 01/24/2021 9:17 AM MRN: [id number] Account #: [id number] Date of Birth: [birth year] Admit Type: Outpatient Age: 65 Room: [facility] ENDO ROOM 1 Gender: Female Note Status: Supervisor Override Instrument Name: Altamese Cabal Endoscope [id number] Procedure:             Upper GI endoscopy Indications:           Generalized abdominal pain, Iron deficiency anemia,                         Variceal screening (no known varices or prior                         bleeding), Weight loss Providers:             Rueben Bash, DO Referring MD:          Ocie Cornfield. Ouida Sills MD, MD (Referring MD) Medicines:             Monitored Anesthesia Care Complications:         No immediate complications. Estimated blood loss:                         Minimal. Procedure:             Pre-Anesthesia Assessment:                        - Prior to the procedure, a History and Physical was                         performed, and patient medications and allergies were                         reviewed. The patient is competent. The risks and                         benefits of the procedure and the sedation options and                         risks were discussed with the patient. All questions                         were answered and informed consent was obtained.                         Patient identification and proposed procedure were                         verified by the physician, the nurse, the anesthetist                         and the technician in the endoscopy suite. Mental                         Status Examination: alert and oriented. Airway                         Examination: normal oropharyngeal airway and neck  mobility. Respiratory Examination: clear to                         auscultation. CV Examination: RRR, no murmurs, no S3                         or S4. Prophylactic Antibiotics: The  patient does not                         require prophylactic antibiotics. Prior                         Anticoagulants: The patient has taken no previous                         anticoagulant or antiplatelet agents. ASA Grade                         Assessment: III - A patient with severe systemic                         disease. After reviewing the risks and benefits, the                         patient was deemed in satisfactory condition to                         undergo the procedure. The anesthesia plan was to use                         monitored anesthesia care (MAC). Immediately prior to                         administration of medications, the patient was                         re-assessed for adequacy to receive sedatives. The                         heart rate, respiratory rate, oxygen saturations,                         blood pressure, adequacy of pulmonary ventilation, and                         response to care were monitored throughout the                         procedure. The physical status of the patient was                         re-assessed after the procedure.                        After obtaining informed consent, the endoscope was                         passed under direct vision. Throughout the procedure,  the patient's blood pressure, pulse, and oxygen                         saturations were monitored continuously. The Endoscope                         was introduced through the mouth, and advanced to the                         second part of duodenum. The upper GI endoscopy was                         accomplished without difficulty. The patient tolerated                         the procedure well. Findings:      The duodenal bulb, first portion of the duodenum and second portion of       the duodenum were normal. Biopsies for histology were taken with a cold       forceps for evaluation of celiac disease. Estimated blood loss  was       minimal.      Diffuse mild inflammation old blood noted characterized by adherent       blood was found in the entire examined stomach. Biopsies were taken with       a cold forceps for histology. Estimated blood loss was minimal.      - No portal gastropathy or GAVE was noted      Esophagogastric landmarks were identified: the Z-line was found at 40 cm       and the gastroesophageal junction was found at 40 cm from the incisors.       Z line was regular. No esophageal varices appreciated. Otherwise normal       mucosa      A small hiatal hernia was present. Estimated blood loss: none. Impression:            - Normal duodenal bulb, first portion of the duodenum                         and second portion of the duodenum. Biopsied.                        - Gastritis old blood noted. Biopsied.                        - Esophagogastric landmarks identified. Recommendation:        - Discharge patient to home.                        - Resume previous diet.                        - No aspirin, ibuprofen, naproxen, or other                         non-steroidal anti-inflammatory drugs at least 48                         hours, recommend indefinitely.                        -  Continue present medications.                        - Await pathology results.                        - Repeat upper endoscopy in 2 years for screening                         purposes.                        - Return to referring physician. Procedure Code(s):     --- Professional ---                        [phone], Esophagogastroduodenoscopy, flexible,                         transoral; with biopsy, single or multiple Diagnosis Code(s):     --- Professional ---                        Z13.810, Encounter for screening for upper                         gastrointestinal disorder                        R63.4, Abnormal weight loss CPT copyright 2019 [employer]. All rights reserved. The codes  documented in this report are preliminary and upon coder review may  be revised to meet current compliance requirements. Attending Participation:      I personally performed the entire procedure. Volney American, DO Annamaria Helling DO, DO 01/24/2021 10:53:24 AM This report has been signed electronically. Number of Addenda: 0 Note Initiated On: 01/24/2021 9:17 AM Estimated Blood Loss:  Estimated blood loss was minimal.      [facility]

## 2021-01-25 ENCOUNTER — Encounter: Payer: Self-pay | Admitting: Gastroenterology

## 2021-01-25 LAB — SURGICAL PATHOLOGY

## 2021-01-31 ENCOUNTER — Other Ambulatory Visit (INDEPENDENT_AMBULATORY_CARE_PROVIDER_SITE_OTHER): Payer: Self-pay | Admitting: Vascular Surgery

## 2021-01-31 ENCOUNTER — Encounter: Payer: Self-pay | Admitting: Oncology

## 2021-01-31 DIAGNOSIS — K559 Vascular disorder of intestine, unspecified: Secondary | ICD-10-CM

## 2021-02-01 ENCOUNTER — Ambulatory Visit (INDEPENDENT_AMBULATORY_CARE_PROVIDER_SITE_OTHER): Payer: Medicare Other | Admitting: Vascular Surgery

## 2021-02-01 ENCOUNTER — Ambulatory Visit (INDEPENDENT_AMBULATORY_CARE_PROVIDER_SITE_OTHER): Payer: Medicare Other

## 2021-02-01 ENCOUNTER — Other Ambulatory Visit: Payer: Self-pay

## 2021-02-01 VITALS — BP 143/74 | HR 92 | Ht 64.0 in | Wt 196.0 lb

## 2021-02-01 DIAGNOSIS — R109 Unspecified abdominal pain: Secondary | ICD-10-CM | POA: Insufficient documentation

## 2021-02-01 DIAGNOSIS — R1084 Generalized abdominal pain: Secondary | ICD-10-CM | POA: Diagnosis not present

## 2021-02-01 DIAGNOSIS — I10 Essential (primary) hypertension: Secondary | ICD-10-CM

## 2021-02-01 DIAGNOSIS — E1122 Type 2 diabetes mellitus with diabetic chronic kidney disease: Secondary | ICD-10-CM | POA: Diagnosis not present

## 2021-02-01 DIAGNOSIS — K559 Vascular disorder of intestine, unspecified: Secondary | ICD-10-CM

## 2021-02-01 DIAGNOSIS — E785 Hyperlipidemia, unspecified: Secondary | ICD-10-CM

## 2021-02-01 DIAGNOSIS — Z794 Long term (current) use of insulin: Secondary | ICD-10-CM

## 2021-02-01 NOTE — Progress Notes (Signed)
Patient ID: Deanna Gay, female   DOB: [birth year], 65 y.o.   MRN: [id number]  Chief Complaint  Patient presents with   New Patient (Initial Visit)    NP Mesenteric and consult referred by [place]     HPI Deanna Gay is a 65 y.o. female.  I am asked to see the patient by Dr. Clayborn Bigness for evaluation of postprandial abdominal pain, food fear, and weight loss.  She has several atherosclerotic risk factors including tobacco use and diabetes.  She has pain that does seem to be exacerbated with eating.  She is taking OxyContin for it.  The pain is mostly upper abdominal.  She has been diagnosed with liver disease and possible cirrhosis even though she does not drink.  She reports having had an endoscopy and colonoscopy not too long ago.  Given her symptoms she is referred with significant concern for mesenteric insufficiency as a cause of her pain.  To evaluate for this, mesenteric duplex was performed today.  This demonstrates normal velocities in the celiac artery, superior mesenteric artery, and inferior mesenteric artery without hemodynamically significant mesenteric stenosis identified.     Past Medical History:  Diagnosis Date   Anemia    Arthritis    Cancer ([place])    cervical   COPD (chronic obstructive pulmonary disease) ([place])    COPD exacerbation ([place]) 07/19/2017   Depression    Diabetes mellitus without complication ([place])    GERD (gastroesophageal reflux disease)    Hypertension    Schizophrenia ([place])    Sleep apnea     Past Surgical History:  Procedure Laterality Date   APPENDECTOMY     COLONOSCOPY N/A 01/24/2021   Procedure: COLONOSCOPY;  Surgeon: Annamaria Helling, DO;  Location: [facility] ENDOSCOPY;  Service: Gastroenterology;  Laterality: N/A;   COLONOSCOPY WITH PROPOFOL N/A 12/25/2014   Procedure: COLONOSCOPY WITH PROPOFOL;  Surgeon: Josefine Class, MD;  Location: [facility] ENDOSCOPY;  Service: Endoscopy;  Laterality: N/A;   COLONOSCOPY WITH PROPOFOL N/A  01/21/2018   Procedure: COLONOSCOPY WITH PROPOFOL;  Surgeon: Lin Landsman, MD;  Location: [facility] ENDOSCOPY;  Service: Gastroenterology;  Laterality: N/A;   ESOPHAGOGASTRODUODENOSCOPY N/A 01/24/2021   Procedure: ESOPHAGOGASTRODUODENOSCOPY (EGD);  Surgeon: Annamaria Helling, DO;  Location: [facility] ENDOSCOPY;  Service: Gastroenterology;  Laterality: N/A;   ESOPHAGOGASTRODUODENOSCOPY (EGD) WITH PROPOFOL N/A 12/25/2014   Procedure: ESOPHAGOGASTRODUODENOSCOPY (EGD) WITH PROPOFOL;  Surgeon: Josefine Class, MD;  Location: [facility] ENDOSCOPY;  Service: Endoscopy;  Laterality: N/A;   ESOPHAGOGASTRODUODENOSCOPY (EGD) WITH PROPOFOL N/A 01/21/2018   Procedure: ESOPHAGOGASTRODUODENOSCOPY (EGD) WITH PROPOFOL;  Surgeon: Lin Landsman, MD;  Location: [facility] ENDOSCOPY;  Service: Gastroenterology;  Laterality: N/A;   GIVENS CAPSULE STUDY N/A 03/02/2018   Procedure: GIVENS CAPSULE STUDY;  Surgeon: Lin Landsman, MD;  Location: [facility] ENDOSCOPY;  Service: Gastroenterology;  Laterality: N/A;   JOINT REPLACEMENT     right hip   TOTAL HIP ARTHROPLASTY Left 05/16/2016   Procedure: LEFT TOTAL HIP ARTHROPLASTY ANTERIOR APPROACH;  Surgeon: Mcarthur Rossetti, MD;  Location: [facility] ORS;  Service: Orthopedics;  Laterality: Left;     Family History  Problem Relation Age of Onset   Hypertension Mother    Diabetes Mellitus II Mother    Breast cancer Neg Hx    Kidney cancer Neg Hx    Bladder Cancer Neg Hx       Social History   Tobacco Use   Smoking status: Every Day    Packs/day: 1.00    Years: 48.00  Pack years: 48.00    Types: Cigarettes   Smokeless tobacco: Never  Vaping Use   Vaping Use: Some days  Substance Use Topics   Alcohol use: No   Drug use: No     Allergies  Allergen Reactions   Penicillins Swelling    Has patient had a PCN reaction causing immediate rash, facial/tongue/throat swelling, SOB or lightheadedness with hypotension: Yes Has patient had a PCN reaction causing severe  rash involving mucus membranes or skin necrosis: Yes Has patient had a PCN reaction that required hospitalization: No Has patient had a PCN reaction occurring within the last 10 years: No If all of the above answers are "NO", then may proceed with Cephalosporin use.  Has patient had a PCN reaction causing immediate rash, facial/tongue/throat swelling, SOB or lightheadedness with hypotension: Yes Has patient had a PCN reaction causing severe rash involving mucus membranes or skin necrosis: Yes Has patient had a PCN reaction that required hospitalization: No Has patient had a PCN reaction occurring within the last 10 years: No If all of the above answers are "NO", then may proceed with Cephalosporin use. Has patient had a PCN reaction causing immediate rash, faci   Aspirin Other (See Comments)    Ongoing anemia   Iodinated Diagnostic Agents Swelling   Iodine Swelling    Current Outpatient Medications  Medication Sig Dispense Refill   ACCU-CHEK AVIVA PLUS test strip use 1 TEST STRIP to TEST BLOOD SUGAR four times a day  0   albuterol (PROVENTIL HFA;VENTOLIN HFA) 108 (90 BASE) MCG/ACT inhaler Inhale 2 puffs into the lungs every 4 (four) hours as needed for wheezing or shortness of breath.      atorvastatin (LIPITOR) 40 MG tablet Take 40 mg by mouth daily.     BANOPHEN 25 MG capsule SMARTSIG:2 Capsule(s) By Mouth     Blood Glucose Monitoring Suppl (ACCU-CHEK AVIVA PLUS) w/Device KIT See admin instructions.  0   budesonide-formoterol (SYMBICORT) 160-4.5 MCG/ACT inhaler Inhale 2 puffs into the lungs 2 (two) times daily.     Calcium Carb-Cholecalciferol 600-400 MG-UNIT TABS Take 1 tablet by mouth daily.     calcium carbonate (OSCAL) 1500 (600 Ca) MG TABS tablet Take by mouth 2 (two) times daily with a meal.     diltiazem (CARDIZEM CD) 180 MG 24 hr capsule Take 180 mg by mouth at bedtime.      diltiazem (TIAZAC) 180 MG 24 hr capsule Take by mouth.     docusate sodium (COLACE) 100 MG capsule Take  100 mg by mouth 2 (two) times daily.     escitalopram (LEXAPRO) 10 MG tablet Take 10 mg by mouth daily.     famotidine (PEPCID) 10 MG tablet Take 1 tablet by mouth daily.     FERREX 150 150 MG capsule TAKE 1 CAPSULE BY MOUTH TWICE DAILY 200 capsule 0   fexofenadine (ALLEGRA) 180 MG tablet Take 180 mg by mouth daily.     fexofenadine (ALLEGRA) 180 MG tablet Take by mouth.     fluticasone (FLONASE) 50 MCG/ACT nasal spray Place 2 sprays into both nostrils daily.     furosemide (LASIX) 20 MG tablet Take 20 mg by mouth daily.     hydrochlorothiazide (HYDRODIURIL) 25 MG tablet take 1 tablet by mouth every morning     insulin glargine (LANTUS) 100 UNIT/ML injection Inject 0.1 mLs (10 Units total) into the skin at bedtime. 10 mL 11   insulin lispro (HUMALOG) 100 UNIT/ML injection Please check your sugars before each  meal and use lispro per the below sliding scale:  For sugars 151-200: take 2 units For 201-250: take 4units For 251-300: take 6 units For 301-350: take 8 units For 351-400: take 10 units For > 401: call MD 10 mL 11   Insulin Syringe-Needle U-100 31G X 5/16" 0.5 ML MISC use as directed once daily     ipratropium-albuterol (DUONEB) 0.5-2.5 (3) MG/3ML SOLN Take 3 mLs by nebulization every 6 (six) hours as needed.     losartan (COZAAR) 50 MG tablet Take 50 mg by mouth daily.     Magnesium 250 MG TABS Take by mouth daily.     meloxicam (MOBIC) 15 MG tablet Take 15 mg by mouth daily.     metoprolol succinate (TOPROL-XL) 25 MG 24 hr tablet Take 25 mg by mouth daily.     montelukast (SINGULAIR) 10 MG tablet Take 10 mg by mouth at bedtime.     Multiple Vitamins-Minerals (ZINC PO) Take by mouth.     Naloxone HCl (EVZIO) 0.4 MG/0.4ML SOAJ Inject as directed.     nitroGLYCERIN (NITROSTAT) 0.4 MG SL tablet Place 1 tablet under the tongue every 5 (five) minutes as needed.  0   nitroGLYCERIN (NITROSTAT) 0.4 MG SL tablet Place under the tongue.     omeprazole (PRILOSEC) 20 MG capsule Take 20 mg by  mouth 2 (two) times daily.     omeprazole (PRILOSEC) 40 MG capsule Take 40 mg by mouth 2 (two) times daily.     oxyCODONE-acetaminophen (PERCOCET) 7.5-325 MG tablet Take 1-2 tablets by mouth every 6 (six) hours as needed for severe pain. 30 tablet 0   potassium chloride (KLOR-CON) 10 MEQ tablet Take 10 mEq by mouth daily.     potassium chloride (KLOR-CON) 10 MEQ tablet TAKE 1 TABLET(10 MEQ) BY MOUTH EVERY DAY     predniSONE (DELTASONE) 10 MG tablet Label  & dispense according to the schedule below. 5 Pills PO for 1 day then, 4 Pills PO for 1 day, 3 Pills PO for 1 day, 2 Pills PO for 1 day, 1 Pill PO for 1 days then STOP. 15 tablet 0   pregabalin (LYRICA) 25 MG capsule Take 25 mg by mouth 2 (two) times daily.     pregabalin (LYRICA) 25 MG capsule TAKE 1 CAPSULE BY MOUTH EVERY 6 HOURS     roflumilast (DALIRESP) 500 MCG TABS tablet Take 500 mcg by mouth daily.     roflumilast (DALIRESP) 500 MCG TABS tablet Take by mouth.     tiotropium (SPIRIVA) 18 MCG inhalation capsule Place 18 mcg into inhaler and inhale daily.     triamcinolone (KENALOG) 0.1 % paste Use as directed 1 application in the mouth or throat 2 (two) times daily.     triamcinolone (KENALOG) 0.1 % paste Apply topically 2 (two) times daily.     TRULICITY 1.74 BS/4.9QP SOPN Inject 0.75 mg into the skin once a week.     vitamin B-12 (CYANOCOBALAMIN) 500 MCG tablet Take 500 mcg by mouth daily.     vitamin B-12 (CYANOCOBALAMIN) 500 MCG tablet Take by mouth.     No current facility-administered medications for this visit.      REVIEW OF SYSTEMS (Negative unless checked)  Constitutional: [] Weight loss  [] Fever  [] Chills Cardiac: [] Chest pain   [] Chest pressure   [] Palpitations   [] Shortness of breath when laying flat   [] Shortness of breath at rest   [] Shortness of breath with exertion. Vascular:  [] Pain in legs with walking   []   Pain in legs at rest   [] Pain in legs when laying flat   [] Claudication   [] Pain in feet when walking  [] Pain  in feet at rest  [] Pain in feet when laying flat   [] History of DVT   [] Phlebitis   [] Swelling in legs   [] Varicose veins   [] Non-healing ulcers Pulmonary:   [] Uses home oxygen   [] Productive cough   [] Hemoptysis   [] Wheeze  [x] COPD   [] Asthma Neurologic:  [] Dizziness  [] Blackouts   [] Seizures   [] History of stroke   [] History of TIA  [] Aphasia   [] Temporary blindness   [] Dysphagia   [] Weakness or numbness in arms   [] Weakness or numbness in legs Musculoskeletal:  [x] Arthritis   [] Joint swelling   [] Joint pain   [] Low back pain Hematologic:  [] Easy bruising  [] Easy bleeding   [] Hypercoagulable state   [x] Anemic  [] Hepatitis Gastrointestinal:  [] Blood in stool   [] Vomiting blood  [x] Gastroesophageal reflux/heartburn   [x] Abdominal pain Genitourinary:  [] Chronic kidney disease   [] Difficult urination  [] Frequent urination  [] Burning with urination   [] Hematuria Skin:  [] Rashes   [] Ulcers   [] Wounds Psychological:  [] History of anxiety   []  History of major depression.    Physical Exam BP (!) 143/74   Pulse 92   Ht 5' 4"  (1.626 m)   Wt 196 lb (88.9 kg)   LMP  (LMP Unknown) Comment: postmenopausal  BMI 33.64 kg/m  Gen:  WD/WN, NAD Head: [place]/AT, No temporalis wasting.  Ear/Nose/Throat: Hearing grossly intact, nares w/o erythema or drainage, oropharynx w/o Erythema/Exudate Eyes: Conjunctiva clear, sclera non-icteric  Neck: trachea midline.  No JVD.  Pulmonary:  Good air movement, respirations not labored, no use of accessory muscles  Cardiac: RRR, no JVD Vascular:  Vessel Right Left  Radial Palpable Palpable                                   Gastrointestinal:. No masses, surgical incisions, or scars. Mild abdominal tenderness. Musculoskeletal: M/S 5/5 throughout.  Extremities without ischemic changes.  No deformity or atrophy. Mild LE edema. Neurologic: Sensation grossly intact in extremities.  Symmetrical.  Speech is fluent. Motor exam as listed above. Psychiatric: Judgment  intact, Mood & affect appropriate for pt's clinical situation. Dermatologic: No rashes or ulcers noted.  No cellulitis or open wounds.    Radiology No results found.  Labs Recent Results (from the past 2160 hour(s))  Surgical pathology     Status: None   Collection Time: 01/24/21  9:45 AM  Result Value Ref Range   SURGICAL PATHOLOGY      SURGICAL PATHOLOGY CASE: [id number] PATIENT: [facility] Surgical Pathology Report     Specimen Submitted: A. Small bowel; cbxs B. Stomach, random; cbxs C. Colon polyp, cecum; cold snare D. Colon polyp x3, descending; c snare and cbx E. 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  small hiatal hernia, gastritis, polyps, diverticulosis, hemorrhoids    DIAGNOSIS: A. SMALL BOWEL; COLD BIOPSY: - ENTERIC MUCOSA WITH PRESERVED VILLOUS ARCHITECTURE AND NO SIGNIFICANT HISTOPATHOLOGIC CHANGE. - NEGATIVE FOR FEATURES OF CELIAC, DYSPLASIA, AND MALIGNANCY.  B. STOMACH, RANDOM; COLD BIOPSY: - GASTRIC ANTRAL AND OXYNTIC MUCOSA WITH FEATURES OF MILD REACTIVE GASTROPATHY. - GASTRIC OXYNTIC MUCOSA WITH PPI EFFECT. - NEGATIVE FOR H. PYLORI, DYSPLASIA, AND  MALIGNANCY.  C. CO LON POLYP, CECUM; COLD SNARE: - SESSILE SERRATED POLYP. - NEGATIVE FOR DYSPLASIA AND MALIGNANCY.  D. COLON POLYPS X3, DESCENDING; COLD SNARE AND COLD BIOPSY: - MULTIPLE FRAGMENTS OF SESSILE SERRATED POLYPS. - NEGATIVE FOR DYSPLASIA AND MALIGNANCY.  E. RECTAL POLYP; COLD SNARE: - TUBULOVILLOUS ADENOMA. - NEGATIVE FOR HIGH-GRADE DYSPLASIA AND MALIGNANCY.  GROSS DESCRIPTION: A. Labeled: Small bowel cbxs rule out celiac disease Received: Formalin Collection time: 9:45 AM on 01/24/2021 Placed into formalin time: 9:45 AM on 01/24/2021 Tissue fragment(s): Multiple Size: Aggregate,  0.8 x 0.4 x 0.1 cm Description: Tan soft tissue fragments Entirely submitted in 1 cassette.  B. Labeled: Random gastric cbxs rule out H. pylori Received: Formalin Collection time: 9:46 AM on 01/24/2021 Placed into formalin time: 9:46 AM on 01/24/2021 Tissue fragment(s): Multiple Size: Aggregate, 0.5 x 0.4 x 0.1 cm Description: Tan soft tissue fragments Entirely submitted in 1 cassette.  C. Lab eled: Cecum polyp cold snare Received: Formalin Collection time: 10:15 AM on 01/24/2021 Placed into formalin time: 10:15 AM on 01/24/2021 Tissue fragment(s): Multiple Size: Aggregate, 0.5 x 0.5 x 0.1 cm Description: Received are tan soft tissue fragments admixed with intestinal debris.  The ratio of soft tissue to intestinal debris is 50: 50. Entirely submitted in 1 cassette.  D. Labeled: Descending colon polyp x3 cold snare and cbx Received: Formalin Collection time: 10:20 AM on 01/24/2021 Placed into formalin time: 10:20 AM on 01/24/2021 Tissue fragment(s): Multiple Size: Aggregate, 2.0 x 0.5 x 0.1 cm Description: Received are tan soft tissue fragments admixed with intestinal debris.  The ratio of soft tissue to intestinal debris is 60: 40. Entirely submitted in 1 cassette.  E. Labeled: Rectum polyp cold snare Received: Formalin Collection time: 10:32 AM on 01/24/2021 Placed into formalin time: 10:32 AM on 01/24/2021 Tissue fragment(s): Multiple Size: Aggregate, 2.5 x  0.9 x 0.2 cm Description: Received are tan soft tissue fragments admixed with intestinal debris.  The ratio of soft tissue to intestinal debris is 30: 70. Entirely submitted in 1 cassette.  [facility] 01/24/2021  Final Diagnosis performed by Allena Napoleon, MD.   Electronically signed 01/25/2021 9:19:25AM The electronic signature indicates that the named Attending Pathologist has evaluated the specimen Technical component performed at [facility], [address], [place], [place] [address] Lab: [phone] Dir: Rush Farmer, MD, MMM   Professional component performed at [facility], [facility], [address], [place], [place] [address] Lab: [phone] Dir: Dellia Nims. Rubinas, MD     Assessment/Plan:  Abdominal pain mesenteric duplex was performed today.  This demonstrates normal velocities in the celiac artery, superior mesenteric artery, and inferior mesenteric artery without hemodynamically significant mesenteric stenosis identified.  She does have a enlarged liver on her ultrasound today.  This correlates with her diagnosis of cirrhosis that she tells me she has.  It does not appear as if her abdominal pain is related to mesenteric ischemia at this time, and I have told her that is a good thing is that is a very serious and life-threatening problem.  She is already plugged into a gastroenterology doctor so I will defer further management work-up of her abdominal pain to them.  I will see her back as needed.  Type 2 diabetes mellitus with chronic kidney disease (HCC) blood glucose control important in reducing the progression of atherosclerotic disease. Also, involved in wound healing. On appropriate medications.   Hypertension, essential blood pressure control important in reducing the progression of atherosclerotic disease. On appropriate oral medications.   Hyperlipidemia lipid  control important in reducing the progression of atherosclerotic disease. Continue statin therapy      Leotis Pain 02/01/2021, 10:33 AM   This note was created with Dragon medical transcription system.  Any errors from dictation are unintentional.

## 2021-02-01 NOTE — Assessment & Plan Note (Signed)
blood glucose control important in reducing the progression of atherosclerotic disease. Also, involved in wound healing. On appropriate medications.  

## 2021-02-01 NOTE — Assessment & Plan Note (Signed)
mesenteric duplex was performed today.  This demonstrates normal velocities in the celiac artery, superior mesenteric artery, and inferior mesenteric artery without hemodynamically significant mesenteric stenosis identified.  She does have a enlarged liver on her ultrasound today.  This correlates with her diagnosis of cirrhosis that she tells me she has.  It does not appear as if her abdominal pain is related to mesenteric ischemia at this time, and I have told her that is a good thing is that is a very serious and life-threatening problem.  She is already plugged into a gastroenterology doctor so I will defer further management work-up of her abdominal pain to them.  I will see her back as needed.

## 2021-02-01 NOTE — Assessment & Plan Note (Signed)
blood pressure control important in reducing the progression of atherosclerotic disease. On appropriate oral medications.  

## 2021-02-01 NOTE — Assessment & Plan Note (Signed)
lipid control important in reducing the progression of atherosclerotic disease. Continue statin therapy  

## 2021-02-12 ENCOUNTER — Other Ambulatory Visit (HOSPITAL_COMMUNITY): Payer: Self-pay | Admitting: Internal Medicine

## 2021-02-12 ENCOUNTER — Other Ambulatory Visit: Payer: Self-pay | Admitting: Internal Medicine

## 2021-02-12 DIAGNOSIS — I350 Nonrheumatic aortic (valve) stenosis: Secondary | ICD-10-CM

## 2021-02-12 DIAGNOSIS — K829 Disease of gallbladder, unspecified: Secondary | ICD-10-CM

## 2021-02-26 ENCOUNTER — Other Ambulatory Visit: Payer: Self-pay

## 2021-02-26 ENCOUNTER — Ambulatory Visit
Admission: RE | Admit: 2021-02-26 | Discharge: 2021-02-26 | Disposition: A | Discharge status: Home / Self Care | Payer: Medicare Other | Source: Ambulatory Visit | Attending: Internal Medicine | Admitting: Internal Medicine

## 2021-02-26 DIAGNOSIS — K829 Disease of gallbladder, unspecified: Secondary | ICD-10-CM | POA: Insufficient documentation

## 2021-03-08 ENCOUNTER — Ambulatory Visit: Payer: Medicare Other

## 2021-03-08 ENCOUNTER — Ambulatory Visit
Admission: RE | Admit: 2021-03-08 | Discharge: 2021-03-08 | Disposition: A | Discharge status: Home / Self Care | Payer: Medicare Other | Source: Ambulatory Visit | Attending: Internal Medicine | Admitting: Internal Medicine

## 2021-03-08 ENCOUNTER — Other Ambulatory Visit: Payer: Self-pay

## 2021-03-08 DIAGNOSIS — I350 Nonrheumatic aortic (valve) stenosis: Secondary | ICD-10-CM

## 2021-03-08 LAB — POCT I-STAT CREATININE: Creatinine, Ser: 1.3 mg/dL — ABNORMAL HIGH (ref 0.44–1.00)

## 2021-03-08 MED ORDER — IOHEXOL 350 MG/ML SOLN
100.0000 mL | Freq: Once | INTRAVENOUS | Status: AC | PRN
  Administered 2021-03-08: 100 mL via INTRAVENOUS

## 2021-05-21 ENCOUNTER — Other Ambulatory Visit: Payer: Self-pay | Admitting: Oncology

## 2021-07-16 ENCOUNTER — Other Ambulatory Visit: Payer: Self-pay | Admitting: Oncology

## 2021-07-17 ENCOUNTER — Other Ambulatory Visit: Payer: Self-pay

## 2021-07-17 DIAGNOSIS — F17210 Nicotine dependence, cigarettes, uncomplicated: Secondary | ICD-10-CM

## 2021-07-17 DIAGNOSIS — Z87891 Personal history of nicotine dependence: Secondary | ICD-10-CM

## 2021-07-29 ENCOUNTER — Ambulatory Visit
Admission: RE | Admit: 2021-07-29 | Discharge: 2021-07-29 | Disposition: A | Discharge status: Home / Self Care | Payer: Medicare Other | Source: Ambulatory Visit | Attending: Acute Care | Admitting: Acute Care

## 2021-07-29 ENCOUNTER — Other Ambulatory Visit: Payer: Self-pay

## 2021-07-29 DIAGNOSIS — Z87891 Personal history of nicotine dependence: Secondary | ICD-10-CM | POA: Diagnosis present

## 2021-07-29 DIAGNOSIS — F17210 Nicotine dependence, cigarettes, uncomplicated: Secondary | ICD-10-CM

## 2021-07-31 ENCOUNTER — Other Ambulatory Visit: Payer: Self-pay | Admitting: *Deleted

## 2021-07-31 DIAGNOSIS — D509 Iron deficiency anemia, unspecified: Secondary | ICD-10-CM

## 2021-08-02 ENCOUNTER — Telehealth: Payer: Self-pay | Admitting: Acute Care

## 2021-08-02 NOTE — Telephone Encounter (Signed)
I have called the patient with the results of her low dose CT. Her scan was read as a Lung  RADS 3, nodules that are probably benign findings, short term follow up suggested: includes nodules with a low likelihood of becoming a clinically active cancer. Radiology recommends a 6 month repeat LDCT follow up. I explained there was a very small new nodule that we need to re-evaluate in 6 months, as we do not feel comfortable  waiting a full year. She is in agreement with this plan. I explained we will call closer to August to get this scheduled .  ?Denise, please place order for 6 month follow up low dose CT Chest., and fax results to PCP. Thanks so much ?

## 2021-08-04 NOTE — Progress Notes (Signed)
?Deanna Gay  ?[phone] [phone] ? ?ID: [name] OB: 03-Sep-1955  MR#: [id number]  GYF#:[id number] ? ?Patient Care Team: ?Kirk Ruths, MD as PCP - General (Internal Medicine) ?Lloyd Huger, MD as Consulting Physician (Oncology) ? ? ?CHIEF COMPLAINT: Iron deficiency anemia. ? ?INTERVAL HISTORY: Patient returns to clinic today for repeat laboratory work and further evaluation.  She complains of chronic weakness and fatigue, but otherwise feels well.  She has no neurologic complaints.  She denies any recent fevers or illnesses.  She denies any chest pain, shortness of breath, cough, or hemoptysis.  She denies any nausea, vomiting, constipation, or diarrhea. She has no melena or hematochezia.  She has no urinary complaints.  Patient offers no further specific complaints today. ? ?REVIEW OF SYSTEMS:   ?Review of Systems  ?Constitutional:  Positive for malaise/fatigue. Negative for fever and weight loss.  ?Respiratory: Negative.  Negative for cough, hemoptysis and shortness of breath.   ?Cardiovascular: Negative.  Negative for chest pain and leg swelling.  ?Gastrointestinal: Negative.  Negative for abdominal pain, blood in stool and melena.  ?Genitourinary: Negative.  Negative for frequency and hematuria.  ?Musculoskeletal: Negative.  Negative for myalgias.  ?Skin: Negative.  Negative for rash.  ?Neurological:  Positive for weakness. Negative for dizziness, focal weakness and headaches.  ?Psychiatric/Behavioral: Negative.  The patient is not nervous/anxious.   ? ?As per HPI. Otherwise, a complete review of systems is negative. ? ?PAST MEDICAL HISTORY: ?Past Medical History:  ?Diagnosis Date  ? Anemia   ? Arthritis   ? Cancer [facility])   ? cervical  ? COPD (chronic obstructive pulmonary disease) ([place])   ? COPD exacerbation ([place]) 07/19/2017  ? Depression   ? Diabetes mellitus without complication ([place])   ? GERD (gastroesophageal reflux disease)   ?  Hypertension   ? Schizophrenia ([place])   ? Sleep apnea   ? ? ?PAST SURGICAL HISTORY: ?Past Surgical History:  ?Procedure Laterality Date  ? APPENDECTOMY    ? COLONOSCOPY N/A 01/24/2021  ? Procedure: COLONOSCOPY;  Surgeon: Annamaria Helling, DO;  Location: [facility] ENDOSCOPY;  Service: Gastroenterology;  Laterality: N/A;  ? COLONOSCOPY WITH PROPOFOL N/A 12/25/2014  ? Procedure: COLONOSCOPY WITH PROPOFOL;  Surgeon: Josefine Class, MD;  Location: [facility] ENDOSCOPY;  Service: Endoscopy;  Laterality: N/A;  ? COLONOSCOPY WITH PROPOFOL N/A 01/21/2018  ? Procedure: COLONOSCOPY WITH PROPOFOL;  Surgeon: Lin Landsman, MD;  Location: [facility] ENDOSCOPY;  Service: Gastroenterology;  Laterality: N/A;  ? ESOPHAGOGASTRODUODENOSCOPY N/A 01/24/2021  ? Procedure: ESOPHAGOGASTRODUODENOSCOPY (EGD);  Surgeon: Annamaria Helling, DO;  Location: [facility] ENDOSCOPY;  Service: Gastroenterology;  Laterality: N/A;  ? ESOPHAGOGASTRODUODENOSCOPY (EGD) WITH PROPOFOL N/A 12/25/2014  ? Procedure: ESOPHAGOGASTRODUODENOSCOPY (EGD) WITH PROPOFOL;  Surgeon: Josefine Class, MD;  Location: [facility] ENDOSCOPY;  Service: Endoscopy;  Laterality: N/A;  ? ESOPHAGOGASTRODUODENOSCOPY (EGD) WITH PROPOFOL N/A 01/21/2018  ? Procedure: ESOPHAGOGASTRODUODENOSCOPY (EGD) WITH PROPOFOL;  Surgeon: Lin Landsman, MD;  Location[facility] ENDOSCOPY;  Service: Gastroenterology;  Laterality: N/A;  ? GIVENS CAPSULE STUDY N/A 03/02/2018  ? Procedure: GIVENS CAPSULE STUDY;  Surgeon: Lin Landsman, MD;  Location: [facility] ENDOSCOPY;  Service: Gastroenterology;  Laterality: N/A;  ? JOINT REPLACEMENT    ? right hip  ? TOTAL HIP ARTHROPLASTY Left 05/16/2016  ? Procedure: LEFT TOTAL HIP ARTHROPLASTY ANTERIOR APPROACH;  Surgeon: Mcarthur Rossetti, MD;  Location: [facility] ORS;  Service: Orthopedics;  Laterality: Left;  ? ? ?FAMILY HISTORY ?Family History  ?Problem Relation Age of Onset  ? Hypertension  Mother   ? Diabetes Mellitus II Mother   ? Breast cancer Neg Hx   ? Kidney cancer Neg Hx    ? Bladder Cancer Neg Hx   ? ? ?  ? ADVANCED DIRECTIVES:  ? ? ?HEALTH MAINTENANCE: ?Social History  ? ?Tobacco Use  ? Smoking status: Every Day  ?  Packs/day: 1.00  ?  Years: 48.00  ?  Pack years: 48.00  ?  Types: Cigarettes  ? Smokeless tobacco: Never  ?Vaping Use  ? Vaping Use: Some days  ?Substance Use Topics  ? Alcohol use: No  ? Drug use: No  ? ? ? Colonoscopy: ? PAP: ? Bone density: ? Lipid panel: ? ?Allergies  ?Allergen Reactions  ? Penicillins Swelling  ?  Has patient had a PCN reaction causing immediate rash, facial/tongue/throat swelling, SOB or lightheadedness with hypotension: Yes ?Has patient had a PCN reaction causing severe rash involving mucus membranes or skin necrosis: Yes ?Has patient had a PCN reaction that required hospitalization: No ?Has patient had a PCN reaction occurring within the last 10 years: No ?If all of the above answers are "NO", then may proceed with Cephalosporin use. ? ?Has patient had a PCN reaction causing immediate rash, facial/tongue/throat swelling, SOB or lightheadedness with hypotension: Yes ?Has patient had a PCN reaction causing severe rash involving mucus membranes or skin necrosis: Yes ?Has patient had a PCN reaction that required hospitalization: No ?Has patient had a PCN reaction occurring within the last 10 years: No ?If all of the above answers are "NO", then may proceed with Cephalosporin use. ?Has patient had a PCN reaction causing immediate rash, faci  ? Aspirin Other (See Comments)  ?  Ongoing anemia  ? Iodinated Contrast Media Swelling  ? Iodine Swelling  ? ? ?Current Outpatient Medications  ?Medication Sig Dispense Refill  ? ACCU-CHEK AVIVA PLUS test strip use 1 TEST STRIP to TEST BLOOD SUGAR four times a day  0  ? albuterol (PROVENTIL HFA;VENTOLIN HFA) 108 (90 BASE) MCG/ACT inhaler Inhale 2 puffs into the lungs every 4 (four) hours as needed for wheezing or shortness of breath.     ? atorvastatin (LIPITOR) 40 MG tablet Take 40 mg by mouth daily.    ?  BANOPHEN 25 MG capsule SMARTSIG:2 Capsule(s) By Mouth    ? Blood Glucose Monitoring Suppl (ACCU-CHEK AVIVA PLUS) w/Device KIT See admin instructions.  0  ? budesonide-formoterol (SYMBICORT) 160-4.5 MCG/ACT inhaler Inhale 2 puffs into the lungs 2 (two) times daily.    ? Calcium Carb-Cholecalciferol 600-400 MG-UNIT TABS Take 1 tablet by mouth daily.    ? calcium carbonate (OSCAL) 1500 (600 Ca) MG TABS tablet Take by mouth 2 (two) times daily with a meal.    ? diltiazem (CARDIZEM CD) 180 MG 24 hr capsule Take 180 mg by mouth at bedtime.     ? diltiazem (TIAZAC) 180 MG 24 hr capsule Take by mouth.    ? docusate sodium (COLACE) 100 MG capsule Take 100 mg by mouth 2 (two) times daily.    ? escitalopram (LEXAPRO) 10 MG tablet Take 10 mg by mouth daily.    ? famotidine (PEPCID) 10 MG tablet Take 1 tablet by mouth daily.    ? FERREX 150 150 MG capsule TAKE 1 CAPSULE BY MOUTH TWICE DAILY 200 capsule 0  ? fexofenadine (ALLEGRA) 180 MG tablet Take 180 mg by mouth daily.    ? fexofenadine (ALLEGRA) 180 MG tablet Take by mouth.    ? fluticasone (FLONASE) 50 MCG/ACT  nasal spray Place 2 sprays into both nostrils daily.    ? furosemide (LASIX) 20 MG tablet Take 20 mg by mouth daily.    ? hydrochlorothiazide (HYDRODIURIL) 25 MG tablet take 1 tablet by mouth every morning    ? insulin glargine (LANTUS) 100 UNIT/ML injection Inject 0.1 mLs (10 Units total) into the skin at bedtime. 10 mL 11  ? insulin lispro (HUMALOG) 100 UNIT/ML injection Please check your sugars before each meal and use lispro per the below sliding scale: ? ?For sugars 151-200: take 2 units ?For 201-250: take 4units ?For 251-300: take 6 units ?For 301-350: take 8 units ?For 351-400: take 10 units ?For > 401: call MD 10 mL 11  ? Insulin Syringe-Needle U-100 31G X 5/16" 0.5 ML MISC use as directed once daily    ? ipratropium-albuterol (DUONEB) 0.5-2.5 (3) MG/3ML SOLN Take 3 mLs by nebulization every 6 (six) hours as needed.    ? losartan (COZAAR) 50 MG tablet Take 50  mg by mouth daily.    ? Magnesium 250 MG TABS Take by mouth daily.    ? meloxicam (MOBIC) 15 MG tablet Take 15 mg by mouth daily.    ? metoprolol succinate (TOPROL-XL) 25 MG 24 hr tablet Take 25 mg by mouth daily.    ?

## 2021-08-05 ENCOUNTER — Other Ambulatory Visit: Payer: Self-pay | Admitting: Acute Care

## 2021-08-05 DIAGNOSIS — F17210 Nicotine dependence, cigarettes, uncomplicated: Secondary | ICD-10-CM

## 2021-08-05 DIAGNOSIS — Z87891 Personal history of nicotine dependence: Secondary | ICD-10-CM

## 2021-08-05 NOTE — Telephone Encounter (Signed)
CT results faxed to PCP. Order placed for 6 mth nodule f/u CT.  ?

## 2021-08-08 ENCOUNTER — Inpatient Hospital Stay (HOSPITAL_BASED_OUTPATIENT_CLINIC_OR_DEPARTMENT_OTHER): Payer: Medicare Other | Admitting: Oncology

## 2021-08-08 ENCOUNTER — Encounter: Payer: Self-pay | Admitting: Oncology

## 2021-08-08 ENCOUNTER — Inpatient Hospital Stay: Payer: Medicare Other | Attending: Oncology

## 2021-08-08 ENCOUNTER — Other Ambulatory Visit: Payer: Self-pay

## 2021-08-08 VITALS — BP 129/76 | HR 101 | Temp 97.1°F | Resp 18 | Wt 214.3 lb

## 2021-08-08 DIAGNOSIS — E1165 Type 2 diabetes mellitus with hyperglycemia: Secondary | ICD-10-CM | POA: Insufficient documentation

## 2021-08-08 DIAGNOSIS — D509 Iron deficiency anemia, unspecified: Secondary | ICD-10-CM | POA: Diagnosis present

## 2021-08-08 DIAGNOSIS — N189 Chronic kidney disease, unspecified: Secondary | ICD-10-CM | POA: Diagnosis not present

## 2021-08-08 DIAGNOSIS — Z8541 Personal history of malignant neoplasm of cervix uteri: Secondary | ICD-10-CM | POA: Diagnosis not present

## 2021-08-08 LAB — CBC WITH DIFFERENTIAL/PLATELET
Abs Immature Granulocytes: 0.01 10*3/uL (ref 0.00–0.07)
Basophils Absolute: 0 10*3/uL (ref 0.0–0.1)
Basophils Relative: 1 %
Eosinophils Absolute: 0.1 10*3/uL (ref 0.0–0.5)
Eosinophils Relative: 2 %
HCT: 37.1 % (ref 36.0–46.0)
Hemoglobin: 11.6 g/dL — ABNORMAL LOW (ref 12.0–15.0)
Immature Granulocytes: 0 %
Lymphocytes Relative: 32 %
Lymphs Abs: 2 10*3/uL (ref 0.7–4.0)
MCH: 26.1 pg (ref 26.0–34.0)
MCHC: 31.3 g/dL (ref 30.0–36.0)
MCV: 83.6 fL (ref 80.0–100.0)
Monocytes Absolute: 0.4 10*3/uL (ref 0.1–1.0)
Monocytes Relative: 7 %
Neutro Abs: 3.6 10*3/uL (ref 1.7–7.7)
Neutrophils Relative %: 58 %
Platelets: 239 10*3/uL (ref 150–400)
RBC: 4.44 MIL/uL (ref 3.87–5.11)
RDW: 15.3 % (ref 11.5–15.5)
WBC: 6.1 10*3/uL (ref 4.0–10.5)
nRBC: 0 % (ref 0.0–0.2)

## 2021-08-08 LAB — FERRITIN: Ferritin: 10 ng/mL — ABNORMAL LOW (ref 11–307)

## 2021-08-08 LAB — IRON AND TIBC
Iron: 44 ug/dL (ref 28–170)
Saturation Ratios: 9 % — ABNORMAL LOW (ref 10.4–31.8)
TIBC: 466 ug/dL — ABNORMAL HIGH (ref 250–450)
UIBC: 422 ug/dL

## 2021-08-08 LAB — SAMPLE TO BLOOD BANK

## 2021-08-08 NOTE — Progress Notes (Signed)
Pt states she is constantly dizzy and feeling tired all of the time. Also, noticing blood in her urine.  ?

## 2021-08-09 ENCOUNTER — Encounter: Payer: Self-pay | Admitting: Oncology

## 2021-08-13 ENCOUNTER — Other Ambulatory Visit: Payer: Self-pay

## 2021-08-13 ENCOUNTER — Ambulatory Visit
Admission: RE | Admit: 2021-08-13 | Discharge: 2021-08-13 | Disposition: A | Discharge status: Home / Self Care | Payer: Medicare Other | Source: Ambulatory Visit | Attending: Nurse Practitioner | Admitting: Nurse Practitioner

## 2021-08-13 DIAGNOSIS — D509 Iron deficiency anemia, unspecified: Secondary | ICD-10-CM | POA: Diagnosis not present

## 2021-08-14 ENCOUNTER — Telehealth: Payer: Self-pay | Admitting: Nurse Practitioner

## 2021-08-14 DIAGNOSIS — N950 Postmenopausal bleeding: Secondary | ICD-10-CM

## 2021-08-14 DIAGNOSIS — Z79810 Long term (current) use of selective estrogen receptor modulators (SERMs): Secondary | ICD-10-CM

## 2021-08-14 NOTE — Telephone Encounter (Signed)
Spoke to patient re: pelvic ultrasound results which was performed for spotting while on tamoxifen. Reviewed imaging with Dr. Fransisca Connors as well who recommends endometrial biopsy. Patient has previously been seen at [facility]. Will ask nursing to reach out to [facility] to get patient scheduled. Patient agreeable and has had biopsy in the past.  ?

## 2021-08-15 ENCOUNTER — Inpatient Hospital Stay: Payer: Medicare Other

## 2021-08-15 ENCOUNTER — Other Ambulatory Visit: Payer: Self-pay | Admitting: Oncology

## 2021-08-15 ENCOUNTER — Other Ambulatory Visit: Payer: Self-pay | Admitting: Emergency Medicine

## 2021-08-15 VITALS — BP 126/80 | HR 80 | Temp 96.4°F | Resp 18

## 2021-08-15 DIAGNOSIS — D509 Iron deficiency anemia, unspecified: Secondary | ICD-10-CM | POA: Diagnosis not present

## 2021-08-15 DIAGNOSIS — N950 Postmenopausal bleeding: Secondary | ICD-10-CM

## 2021-08-15 MED ORDER — SODIUM CHLORIDE 0.9 % IV SOLN
INTRAVENOUS | Status: DC
  Filled 2021-08-15: qty 250

## 2021-08-15 MED ORDER — SODIUM CHLORIDE 0.9 % IV SOLN
510.0000 mg | Freq: Once | INTRAVENOUS | Status: AC
  Administered 2021-08-15: 510 mg via INTRAVENOUS
  Filled 2021-08-15: qty 510

## 2021-08-15 NOTE — Telephone Encounter (Signed)
After speaking with scheduler at [facility], referral faxed to office. ?

## 2021-08-15 NOTE — Patient Instructions (Signed)
[facility] [place]  Discharge Instructions: ?Thank you for choosing [facility] to provide your oncology and hematology care.  ?If you have a lab appointment with the [facility], please go directly to the [facility] and check in at the registration area. ? ?Wear comfortable clothing and clothing appropriate for easy access to any Portacath or PICC line.  ? ?We strive to give you quality time with your provider. You may need to reschedule your appointment if you arrive late (15 or more minutes).  Arriving late affects you and other patients whose appointments are after yours.  Also, if you miss three or more appointments without notifying the office, you may be dismissed from the clinic at the provider?s discretion.    ?  ?For prescription refill requests, have your pharmacy contact our office and allow 72 hours for refills to be completed.   ? ?Today you received the following chemotherapy and/or immunotherapy agents FERAHEME    ?  ?To help prevent nausea and vomiting after your treatment, we encourage you to take your nausea medication as directed. ? ?BELOW ARE SYMPTOMS THAT SHOULD BE REPORTED IMMEDIATELY: ?*FEVER GREATER THAN 100.4 F (38 ?C) OR HIGHER ?*CHILLS OR SWEATING ?*NAUSEA AND VOMITING THAT IS NOT CONTROLLED WITH YOUR NAUSEA MEDICATION ?*UNUSUAL SHORTNESS OF BREATH ?*UNUSUAL BRUISING OR BLEEDING ?*URINARY PROBLEMS (pain or burning when urinating, or frequent urination) ?*BOWEL PROBLEMS (unusual diarrhea, constipation, pain near the anus) ?TENDERNESS IN MOUTH AND THROAT WITH OR WITHOUT PRESENCE OF ULCERS (sore throat, sores in mouth, or a toothache) ?UNUSUAL RASH, SWELLING OR PAIN  ?UNUSUAL VAGINAL DISCHARGE OR ITCHING  ? ?Items with * indicate a potential emergency and should be followed up as soon as possible or go to the Emergency Department if any problems should occur. ? ?Please show the CHEMOTHERAPY ALERT CARD or IMMUNOTHERAPY ALERT CARD at check-in to  the Emergency Department and triage nurse. ? ?Should you have questions after your visit or need to cancel or reschedule your appointment, please contact [facility] [place] [facility] [place]  [phone] and follow the prompts.  Office hours are 8:00 a.m. to 4:30 p.m. Monday - Friday. Please note that voicemails left after 4:00 p.m. may not be returned until the following business day.  We are closed weekends and major holidays. You have access to a nurse at all times for urgent questions. Please call the main number to the clinic [phone] and follow the prompts. ? ?For any non-urgent questions, you may also contact your provider using MyChart. We now offer e-Visits for anyone 3 and older to request care online for non-urgent symptoms. For details visit [email or web address]. ?  ?Also download the MyChart app! Go to the app store, search "MyChart", open the app, select [facility], and log in with your MyChart username and password. ? ?Due to Covid, a mask is required upon entering the hospital/clinic. If you do not have a mask, one will be given to you upon arrival. For doctor visits, patients may have 1 support person aged 41 or older with them. For treatment visits, patients cannot have anyone with them due to current Covid guidelines and our immunocompromised population.  ? ? ?Ferumoxytol Injection ?What is this medication? ?FERUMOXYTOL (FER ue MOX i tol) treats low levels of iron in your body (iron deficiency anemia). Iron is a mineral that plays an important role in making red blood cells, which carry oxygen from your lungs to the rest of your body. ?This medicine may be used  for other purposes; ask your health care provider or pharmacist if you have questions. ?COMMON BRAND NAME(S): Feraheme ?What should I tell my care team before I take this medication? ?They need to know if you have any of these conditions: ?Anemia not caused by low iron levels ?High levels of iron in the blood ?Magnetic  resonance imaging (MRI) test scheduled ?An unusual or allergic reaction to iron, other medications, foods, dyes, or preservatives ?Pregnant or trying to get pregnant ?Breast-feeding ?How should I use this medication? ?This medication is for injection into a vein. It is given in a hospital or clinic setting. ?Talk to your care team the use of this medication in children. Special care may be needed. ?Overdosage: If you think you have taken too much of this medicine contact a poison control center or emergency room at once. ?NOTE: This medicine is only for you. Do not share this medicine with others. ?What if I miss a dose? ?It is important not to miss your dose. Call your care team if you are unable to keep an appointment. ?What may interact with this medication? ?Other iron products ?This list may not describe all possible interactions. Give your health care provider a list of all the medicines, herbs, non-prescription drugs, or dietary supplements you use. Also tell them if you smoke, drink alcohol, or use illegal drugs. Some items may interact with your medicine. ?What should I watch for while using this medication? ?Visit your care team regularly. Tell your care team if your symptoms do not start to get better or if they get worse. You may need blood work done while you are taking this medication. ?You may need to follow a special diet. Talk to your care team. Foods that contain iron include: whole grains/cereals, dried fruits, beans, or peas, leafy green vegetables, and organ meats (liver, kidney). ?What side effects may I notice from receiving this medication? ?Side effects that you should report to your care team as soon as possible: ?Allergic reactions--skin rash, itching, hives, swelling of the face, lips, tongue, or throat ?Low blood pressure--dizziness, feeling faint or lightheaded, blurry vision ?Shortness of breath ?Side effects that usually do not require medical attention (report to your care team if  they continue or are bothersome): ?Flushing ?Headache ?Joint pain ?Muscle pain ?Nausea ?Pain, redness, or irritation at injection site ?This list may not describe all possible side effects. Call your doctor for medical advice about side effects. You may report side effects to FDA at 1-800-FDA-1088. ?Where should I keep my medication? ?This medication is given in a hospital or clinic and will not be stored at home. ?NOTE: This sheet is a summary. It may not cover all possible information. If you have questions about this medicine, talk to your doctor, pharmacist, or health care provider. ?? 2022 Elsevier/Gold Standard (2020-09-28 00:00:00) ? ?

## 2021-08-22 ENCOUNTER — Inpatient Hospital Stay: Payer: Medicare Other | Attending: Oncology

## 2021-08-22 ENCOUNTER — Ambulatory Visit: Payer: Medicare Other

## 2021-08-22 VITALS — BP 133/71 | HR 63 | Temp 97.0°F | Resp 18

## 2021-08-22 DIAGNOSIS — D509 Iron deficiency anemia, unspecified: Secondary | ICD-10-CM | POA: Diagnosis not present

## 2021-08-22 MED ORDER — SODIUM CHLORIDE 0.9 % IV SOLN
510.0000 mg | Freq: Once | INTRAVENOUS | Status: AC
  Administered 2021-08-22: 510 mg via INTRAVENOUS
  Filled 2021-08-22: qty 510

## 2021-08-22 MED ORDER — SODIUM CHLORIDE 0.9 % IV SOLN
Freq: Once | INTRAVENOUS | Status: AC
  Filled 2021-08-22: qty 250

## 2021-10-10 ENCOUNTER — Other Ambulatory Visit: Payer: Self-pay | Admitting: Oncology

## 2021-10-11 ENCOUNTER — Other Ambulatory Visit: Payer: Self-pay | Admitting: Internal Medicine

## 2021-10-11 DIAGNOSIS — Z1231 Encounter for screening mammogram for malignant neoplasm of breast: Secondary | ICD-10-CM

## 2021-11-05 ENCOUNTER — Other Ambulatory Visit: Payer: Self-pay

## 2021-11-05 DIAGNOSIS — D509 Iron deficiency anemia, unspecified: Secondary | ICD-10-CM

## 2021-11-06 ENCOUNTER — Inpatient Hospital Stay: Payer: Medicare Other | Attending: Oncology

## 2021-11-06 DIAGNOSIS — Z8541 Personal history of malignant neoplasm of cervix uteri: Secondary | ICD-10-CM | POA: Diagnosis not present

## 2021-11-06 DIAGNOSIS — E1122 Type 2 diabetes mellitus with diabetic chronic kidney disease: Secondary | ICD-10-CM | POA: Insufficient documentation

## 2021-11-06 DIAGNOSIS — N189 Chronic kidney disease, unspecified: Secondary | ICD-10-CM | POA: Diagnosis not present

## 2021-11-06 DIAGNOSIS — I129 Hypertensive chronic kidney disease with stage 1 through stage 4 chronic kidney disease, or unspecified chronic kidney disease: Secondary | ICD-10-CM | POA: Insufficient documentation

## 2021-11-06 DIAGNOSIS — D509 Iron deficiency anemia, unspecified: Secondary | ICD-10-CM | POA: Diagnosis present

## 2021-11-06 LAB — CBC WITH DIFFERENTIAL/PLATELET
Abs Immature Granulocytes: 0.01 10*3/uL (ref 0.00–0.07)
Basophils Absolute: 0.1 10*3/uL (ref 0.0–0.1)
Basophils Relative: 1 %
Eosinophils Absolute: 0.1 10*3/uL (ref 0.0–0.5)
Eosinophils Relative: 2 %
HCT: 42.4 % (ref 36.0–46.0)
Hemoglobin: 13.5 g/dL (ref 12.0–15.0)
Immature Granulocytes: 0 %
Lymphocytes Relative: 36 %
Lymphs Abs: 2 10*3/uL (ref 0.7–4.0)
MCH: 27.6 pg (ref 26.0–34.0)
MCHC: 31.8 g/dL (ref 30.0–36.0)
MCV: 86.5 fL (ref 80.0–100.0)
Monocytes Absolute: 0.5 10*3/uL (ref 0.1–1.0)
Monocytes Relative: 8 %
Neutro Abs: 3.1 10*3/uL (ref 1.7–7.7)
Neutrophils Relative %: 53 %
Platelets: 251 10*3/uL (ref 150–400)
RBC: 4.9 MIL/uL (ref 3.87–5.11)
RDW: 16.4 % — ABNORMAL HIGH (ref 11.5–15.5)
WBC: 5.7 10*3/uL (ref 4.0–10.5)
nRBC: 0 % (ref 0.0–0.2)

## 2021-11-06 LAB — IRON AND TIBC
Iron: 55 ug/dL (ref 28–170)
Saturation Ratios: 16 % (ref 10.4–31.8)
TIBC: 351 ug/dL (ref 250–450)
UIBC: 296 ug/dL

## 2021-11-06 LAB — FERRITIN: Ferritin: 71 ng/mL (ref 11–307)

## 2021-11-07 ENCOUNTER — Inpatient Hospital Stay (HOSPITAL_BASED_OUTPATIENT_CLINIC_OR_DEPARTMENT_OTHER): Payer: Medicare Other | Admitting: Medical Oncology

## 2021-11-07 VITALS — BP 143/78 | HR 61 | Temp 97.2°F | Wt 213.0 lb

## 2021-11-07 DIAGNOSIS — N950 Postmenopausal bleeding: Secondary | ICD-10-CM | POA: Diagnosis not present

## 2021-11-07 DIAGNOSIS — D509 Iron deficiency anemia, unspecified: Secondary | ICD-10-CM

## 2021-11-07 NOTE — Progress Notes (Signed)
[place] [facility]  Telephone:(336) [phone] Fax:(336) [phone]  ID: [name] OB: 1956/02/04  MR#: [id number]  BSW#:[id number]  Patient Care Team: Kirk Ruths, MD as PCP - General (Internal Medicine) Lloyd Huger, MD as Consulting Physician (Oncology)   CHIEF COMPLAINT: Iron deficiency anemia.  INTERVAL HISTORY: Patient returns to clinic today for repeat laboratory work and further evaluation. She still struggles with chronic fatigue and weakness but is having extensive work up with cardiology, pulmonology. She has an appointment with cardiology today. She also has an upcoming appointment on 11/14/2021 with GYN related to scant urinary or vaginal bleeding that she has had for "years". Last Feraheme treatment was on 08/22/2021 which she tolerated well. Now taking oral iron which she is tolerating well. She has no neurologic complaints.  She denies any recent fevers or illnesses.  She denies any chest pain, shortness of breath, cough, or hemoptysis.  She denies any nausea, vomiting, constipation, or diarrhea. She has no melena or hematochezia.  She has no urinary complaints.  Patient offers no further specific complaints today.  REVIEW OF SYSTEMS:   Review of Systems  Constitutional:  Positive for malaise/fatigue. Negative for fever and weight loss.  Respiratory: Negative.  Negative for cough, hemoptysis and shortness of breath.   Cardiovascular: Negative.  Negative for chest pain and leg swelling.  Gastrointestinal: Negative.  Negative for abdominal pain, blood in stool and melena.  Genitourinary: Negative.  Negative for frequency and hematuria.  Musculoskeletal: Negative.  Negative for myalgias.  Skin: Negative.  Negative for rash.  Neurological:  Positive for weakness. Negative for dizziness, focal weakness and headaches.  Psychiatric/Behavioral: Negative.  The patient is not nervous/anxious.     As per HPI. Otherwise, a complete review of systems is  negative.  PAST MEDICAL HISTORY: Past Medical History:  Diagnosis Date   Anemia    Arthritis    Cancer ([place])    cervical   COPD (chronic obstructive pulmonary disease) ([place])    COPD exacerbation ([place]) 07/19/2017   Depression    Diabetes mellitus without complication ([place])    GERD (gastroesophageal reflux disease)    Hypertension    Schizophrenia ([place])    Sleep apnea     PAST SURGICAL HISTORY: Past Surgical History:  Procedure Laterality Date   APPENDECTOMY     COLONOSCOPY N/A 01/24/2021   Procedure: COLONOSCOPY;  Surgeon: Annamaria Helling, DO;  Location: [facility] ENDOSCOPY;  Service: Gastroenterology;  Laterality: N/A;   COLONOSCOPY WITH PROPOFOL N/A 12/25/2014   Procedure: COLONOSCOPY WITH PROPOFOL;  Surgeon: Josefine Class, MD;  Location: [facility] ENDOSCOPY;  Service: Endoscopy;  Laterality: N/A;   COLONOSCOPY WITH PROPOFOL N/A 01/21/2018   Procedure: COLONOSCOPY WITH PROPOFOL;  Surgeon: Lin Landsman, MD;  Location: [facility] ENDOSCOPY;  Service: Gastroenterology;  Laterality: N/A;   ESOPHAGOGASTRODUODENOSCOPY N/A 01/24/2021   Procedure: ESOPHAGOGASTRODUODENOSCOPY (EGD);  Surgeon: Annamaria Helling, DO;  Location: [facility] ENDOSCOPY;  Service: Gastroenterology;  Laterality: N/A;   ESOPHAGOGASTRODUODENOSCOPY (EGD) WITH PROPOFOL N/A 12/25/2014   Procedure: ESOPHAGOGASTRODUODENOSCOPY (EGD) WITH PROPOFOL;  Surgeon: Josefine Class, MD;  Location: [facility] ENDOSCOPY;  Service: Endoscopy;  Laterality: N/A;   ESOPHAGOGASTRODUODENOSCOPY (EGD) WITH PROPOFOL N/A 01/21/2018   Procedure: ESOPHAGOGASTRODUODENOSCOPY (EGD) WITH PROPOFOL;  Surgeon: Lin Landsman, MD;  Location: [facility] ENDOSCOPY;  Service: Gastroenterology;  Laterality: N/A;   GIVENS CAPSULE STUDY N/A 03/02/2018   Procedure: GIVENS CAPSULE STUDY;  Surgeon: Lin Landsman, MD;  Location: [facility] ENDOSCOPY;  Service: Gastroenterology;  Laterality: N/A;   JOINT REPLACEMENT  right hip   TOTAL HIP ARTHROPLASTY Left 05/16/2016    Procedure: LEFT TOTAL HIP ARTHROPLASTY ANTERIOR APPROACH;  Surgeon: Mcarthur Rossetti, MD;  Location: [facility] ORS;  Service: Orthopedics;  Laterality: Left;    FAMILY HISTORY Family History  Problem Relation Age of Onset   Hypertension Mother    Diabetes Mellitus II Mother    Breast cancer Neg Hx    Kidney cancer Neg Hx    Bladder Cancer Neg Hx        ADVANCED DIRECTIVES:    HEALTH MAINTENANCE: Social History   Tobacco Use   Smoking status: Every Day    Packs/day: 1.00    Years: 48.00    Total pack years: 48.00    Types: Cigarettes   Smokeless tobacco: Never  Vaping Use   Vaping Use: Some days  Substance Use Topics   Alcohol use: No   Drug use: No     Colonoscopy:  PAP:  Bone density:  Lipid panel:  Allergies  Allergen Reactions   Penicillins Swelling    Has patient had a PCN reaction causing immediate rash, facial/tongue/throat swelling, SOB or lightheadedness with hypotension: Yes Has patient had a PCN reaction causing severe rash involving mucus membranes or skin necrosis: Yes Has patient had a PCN reaction that required hospitalization: No Has patient had a PCN reaction occurring within the last 10 years: No If all of the above answers are "NO", then may proceed with Cephalosporin use.  Has patient had a PCN reaction causing immediate rash, facial/tongue/throat swelling, SOB or lightheadedness with hypotension: Yes Has patient had a PCN reaction causing severe rash involving mucus membranes or skin necrosis: Yes Has patient had a PCN reaction that required hospitalization: No Has patient had a PCN reaction occurring within the last 10 years: No If all of the above answers are "NO", then may proceed with Cephalosporin use. Has patient had a PCN reaction causing immediate rash, faci   Aspirin Other (See Comments)    Ongoing anemia   Iodinated Contrast Media Swelling   Iodine Swelling    Current Outpatient Medications  Medication Sig Dispense Refill    ACCU-CHEK AVIVA PLUS test strip use 1 TEST STRIP to TEST BLOOD SUGAR four times a day  0   albuterol (PROVENTIL HFA;VENTOLIN HFA) 108 (90 BASE) MCG/ACT inhaler Inhale 2 puffs into the lungs every 4 (four) hours as needed for wheezing or shortness of breath.      atorvastatin (LIPITOR) 40 MG tablet Take 40 mg by mouth daily.     BANOPHEN 25 MG capsule SMARTSIG:2 Capsule(s) By Mouth     Blood Glucose Monitoring Suppl (ACCU-CHEK AVIVA PLUS) w/Device KIT See admin instructions.  0   budesonide-formoterol (SYMBICORT) 160-4.5 MCG/ACT inhaler Inhale 2 puffs into the lungs 2 (two) times daily.     Calcium Carb-Cholecalciferol 600-400 MG-UNIT TABS Take 1 tablet by mouth daily.     calcium carbonate (OSCAL) 1500 (600 Ca) MG TABS tablet Take by mouth 2 (two) times daily with a meal.     diltiazem (CARDIZEM CD) 180 MG 24 hr capsule Take 180 mg by mouth at bedtime.      diltiazem (TIAZAC) 180 MG 24 hr capsule Take by mouth.     docusate sodium (COLACE) 100 MG capsule Take 100 mg by mouth 2 (two) times daily.     escitalopram (LEXAPRO) 10 MG tablet Take 10 mg by mouth daily.     famotidine (PEPCID) 10 MG tablet Take 1 tablet by mouth daily.  fexofenadine (ALLEGRA) 180 MG tablet Take 180 mg by mouth daily.     fexofenadine (ALLEGRA) 180 MG tablet Take by mouth.     fluticasone (FLONASE) 50 MCG/ACT nasal spray Place 2 sprays into both nostrils daily.     furosemide (LASIX) 20 MG tablet Take 20 mg by mouth daily.     hydrochlorothiazide (HYDRODIURIL) 25 MG tablet take 1 tablet by mouth every morning     insulin glargine (LANTUS) 100 UNIT/ML injection Inject 0.1 mLs (10 Units total) into the skin at bedtime. 10 mL 11   insulin lispro (HUMALOG) 100 UNIT/ML injection Please check your sugars before each meal and use lispro per the below sliding scale:  For sugars 151-200: take 2 units For 201-250: take 4units For 251-300: take 6 units For 301-350: take 8 units For 351-400: take 10 units For > 401: call MD  10 mL 11   Insulin Syringe-Needle U-100 31G X 5/16" 0.5 ML MISC use as directed once daily     ipratropium-albuterol (DUONEB) 0.5-2.5 (3) MG/3ML SOLN Take 3 mLs by nebulization every 6 (six) hours as needed.     iron polysaccharides (NIFEREX) 150 MG capsule TAKE 1 CAPSULE BY MOUTH TWICE DAILY 200 capsule 0   losartan (COZAAR) 50 MG tablet Take 50 mg by mouth daily.     Magnesium 250 MG TABS Take by mouth daily.     meloxicam (MOBIC) 15 MG tablet Take 15 mg by mouth daily.     metoprolol succinate (TOPROL-XL) 25 MG 24 hr tablet Take 25 mg by mouth daily.     montelukast (SINGULAIR) 10 MG tablet Take 10 mg by mouth at bedtime.     Multiple Vitamins-Minerals (ZINC PO) Take by mouth.     Naloxone HCl (EVZIO) 0.4 MG/0.4ML SOAJ Inject as directed.     nitroGLYCERIN (NITROSTAT) 0.4 MG SL tablet Place 1 tablet under the tongue every 5 (five) minutes as needed.  0   nitroGLYCERIN (NITROSTAT) 0.4 MG SL tablet Place under the tongue.     omeprazole (PRILOSEC) 20 MG capsule Take 20 mg by mouth 2 (two) times daily.     omeprazole (PRILOSEC) 40 MG capsule Take 40 mg by mouth 2 (two) times daily. (Patient not taking: Reported on 08/08/2021)     oxyCODONE-acetaminophen (PERCOCET) 7.5-325 MG tablet Take 1-2 tablets by mouth every 6 (six) hours as needed for severe pain. 30 tablet 0   potassium chloride (KLOR-CON) 10 MEQ tablet Take 10 mEq by mouth daily.     predniSONE (DELTASONE) 10 MG tablet Label  & dispense according to the schedule below. 5 Pills PO for 1 day then, 4 Pills PO for 1 day, 3 Pills PO for 1 day, 2 Pills PO for 1 day, 1 Pill PO for 1 days then STOP. 15 tablet 0   pregabalin (LYRICA) 25 MG capsule Take 25 mg by mouth 2 (two) times daily.     roflumilast (DALIRESP) 500 MCG TABS tablet Take 500 mcg by mouth daily.     tiotropium (SPIRIVA) 18 MCG inhalation capsule Place 18 mcg into inhaler and inhale daily.     triamcinolone (KENALOG) 0.1 % paste Use as directed 1 application in the mouth or throat  2 (two) times daily.     TRULICITY 1.32 GM/0.1UU SOPN Inject 0.75 mg into the skin once a week.     vitamin B-12 (CYANOCOBALAMIN) 500 MCG tablet Take 500 mcg by mouth daily.     No current facility-administered medications for this visit.  OBJECTIVE: Vitals:   11/07/21 0923  BP: (!) 143/78  Pulse: 61  Temp: (!) 97.2 F (36.2 C)     Body mass index is 36.56 kg/m.    ECOG FS:0 - Asymptomatic  General: Well-developed, well-nourished, no acute distress. Eyes: Pink conjunctiva, anicteric sclera. HEENT: Normocephalic, moist mucous membranes. Lungs: No audible wheezing or coughing. Heart: Regular rate and rhythm. Abdomen: Soft, nontender, no obvious distention. Musculoskeletal: No edema, cyanosis, or clubbing. Neuro: Alert, answering all questions appropriately. Cranial nerves grossly intact. Skin: No rashes or petechiae noted. Psych: Normal affect.   LAB RESULTS:  Lab Results  Component Value Date   NA 138 07/03/2020   K 4.4 07/03/2020   CL 96 (L) 07/03/2020   CO2 31 07/03/2020   GLUCOSE 114 (H) 07/03/2020   BUN 17 07/03/2020   CREATININE 1.30 (H) 03/08/2021   CALCIUM 8.6 (L) 07/03/2020   PROT 7.6 06/06/2020   ALBUMIN 2.8 (L) 06/06/2020   AST 13 (L) 06/06/2020   ALT 17 06/06/2020   ALKPHOS 70 06/06/2020   BILITOT 0.5 06/06/2020   GFRNONAA 45 (L) 07/03/2020   GFRAA 49 (L) 10/31/2019    Lab Results  Component Value Date   WBC 5.7 11/06/2021   NEUTROABS 3.1 11/06/2021   HGB 13.5 11/06/2021   HCT 42.4 11/06/2021   MCV 86.5 11/06/2021   PLT 251 11/06/2021   Lab Results  Component Value Date   FERRITIN 71 11/06/2021   Lab Results  Component Value Date   IRON 55 11/06/2021   TIBC 351 11/06/2021   IRONPCTSAT 16 11/06/2021      STUDIES: No results found.  ASSESSMENT: Iron deficiency anemia.  PLAN:    1. Iron deficiency anemia: Chronic and greatly improved with Feraheme and oral iron. Patient's most recent colonoscopy and endoscopy on January 24, 2021 revealed multiple polyps, but no other significant pathology. GYN evaluation pending and she reports that she has had urological evaluation as well. She will continue her oral supplementation and follow up in 3 months or sooner as needed.   2. Hyperglycemia:  Continue insulin and diabetic medications as prescribed. 3.  Chronic renal insufficiency: Chronic. Continue PCP follow up 4.  Spotting: Patient reports a distant history of cervical cancer from the 87s. Has GYN appointment for this on the 29th which she is keeping.   RTC 3 months with labs(CBC, BMP, Iron/TIBC, ferritin) on Day 1 and MD on day 2  Patient expressed understanding and was in agreement with this plan. She also understands that She can call clinic at any time with any questions, concerns, or complaints.    Hughie Closs, PA-C   11/07/2021 9:51 AM

## 2021-11-07 NOTE — Progress Notes (Signed)
Returns for follow-up. Reports continued dizziness with standing and fatigue.

## 2021-11-12 ENCOUNTER — Ambulatory Visit
Admission: RE | Admit: 2021-11-12 | Discharge: 2021-11-12 | Disposition: A | Discharge status: Home / Self Care | Payer: Medicare Other | Source: Ambulatory Visit | Attending: Internal Medicine | Admitting: Internal Medicine

## 2021-11-12 DIAGNOSIS — Z1231 Encounter for screening mammogram for malignant neoplasm of breast: Secondary | ICD-10-CM | POA: Diagnosis present

## 2021-12-13 ENCOUNTER — Other Ambulatory Visit: Payer: Self-pay | Admitting: Oncology

## 2022-01-30 ENCOUNTER — Ambulatory Visit (INDEPENDENT_AMBULATORY_CARE_PROVIDER_SITE_OTHER): Payer: Medicare Other | Admitting: Dermatology

## 2022-01-30 DIAGNOSIS — S50862A Insect bite (nonvenomous) of left forearm, initial encounter: Secondary | ICD-10-CM | POA: Diagnosis not present

## 2022-01-30 DIAGNOSIS — L03114 Cellulitis of left upper limb: Secondary | ICD-10-CM | POA: Diagnosis not present

## 2022-01-30 DIAGNOSIS — Z79899 Other long term (current) drug therapy: Secondary | ICD-10-CM

## 2022-01-30 DIAGNOSIS — W57XXXA Bitten or stung by nonvenomous insect and other nonvenomous arthropods, initial encounter: Secondary | ICD-10-CM | POA: Diagnosis not present

## 2022-01-30 DIAGNOSIS — R21 Rash and other nonspecific skin eruption: Secondary | ICD-10-CM | POA: Diagnosis not present

## 2022-01-30 MED ORDER — DOXYCYCLINE HYCLATE 100 MG PO TABS: 100.0000 mg | ORAL_TABLET | Freq: Two times a day (BID) | ORAL | 0 refills | Status: AC

## 2022-01-30 MED ORDER — MUPIROCIN 2 % EX OINT: 1.0000 | TOPICAL_OINTMENT | Freq: Two times a day (BID) | CUTANEOUS | 0 refills | Status: DC

## 2022-01-30 NOTE — Progress Notes (Unsigned)
   Follow-Up Visit   Subjective  Deanna Gay is a 66 y.o. female who presents for the following: Other (Possible bug bite of left arm - very sore and swollen, feels hot).  The following portions of the chart were reviewed this encounter and updated as appropriate:   Tobacco  Allergies  Meds  Problems  Med Hx  Surg Hx  Fam Hx     Review of Systems:  No other skin or systemic complaints except as noted in HPI or Assessment and Plan.  Objective  Well appearing patient in no apparent distress; mood and affect are within normal limits.  A focused examination was performed including left arm. Relevant physical exam findings are noted in the Assessment and Plan.  Left Forearm - Posterior 2.0 cm swelling with central crust, tender with edema of left arm.   Assessment & Plan  Rash Left Forearm - Posterior  Bite reaction with cellulitis  Doxycycline 100 mg 1 po bid with food and plenty of fluid  Mupirocin ointment qd-bid  doxycycline (VIBRA-TABS) 100 MG tablet - Left Forearm - Posterior Take 1 tablet (100 mg total) by mouth 2 (two) times daily. With food and plenty of fluid  mupirocin ointment (BACTROBAN) 2 % - Left Forearm - Posterior Apply 1 Application topically 2 (two) times daily.   Return if symptoms worsen or fail to improve.  I, Ashok Cordia, CMA, am acting as scribe for Sarina Ser, MD . Documentation: I have reviewed the above documentation for accuracy and completeness, and I agree with the above.  Sarina Ser, MD

## 2022-01-30 NOTE — Patient Instructions (Signed)
Due to recent changes in healthcare laws, you may see results of your pathology and/or laboratory studies on MyChart before the doctors have had a chance to review them. We understand that in some cases there may be results that are confusing or concerning to you. Please understand that not all results are received at the same time and often the doctors may need to interpret multiple results in order to provide you with the best plan of care or course of treatment. Therefore, we ask that you please give [place] 2 business days to thoroughly review all your results before contacting the office for clarification. Should we see a critical lab result, you will be contacted sooner.   If You Need Anything After Your Visit  If you have any questions or concerns for your doctor, please call our main line at [phone] and press option 4 to reach your doctor's medical assistant. If no one answers, please leave a voicemail as directed and we will return your call as soon as possible. Messages left after 4 pm will be answered the following business day.   You may also send [place] a message via [place]. We typically respond to MyChart messages within 1-2 business days.  For prescription refills, please ask your pharmacy to contact our office. Our fax number is [phone].  If you have an urgent issue when the clinic is closed that cannot wait until the next business day, you can page your doctor at the number below.    Please note that while we do our best to be available for urgent issues outside of office hours, we are not available 24/7.   If you have an urgent issue and are unable to reach [place], you may choose to seek medical care at your doctor's office, retail clinic, urgent care center, or emergency room.  If you have a medical emergency, please immediately call 911 or go to the emergency department.  Pager Numbers  - Dr. Nehemiah Massed: [phone]  - Dr. Laurence Ferrari: [phone]  - Dr. Nicole Kindred:  [phone]  In the event of inclement weather, please call our main line at [phone] for an update on the status of any delays or closures.  Dermatology Medication Tips: Please keep the boxes that topical medications come in in order to help keep track of the instructions about where and how to use these. Pharmacies typically print the medication instructions only on the boxes and not directly on the medication tubes.   If your medication is too expensive, please contact our office at [phone] option 4 or send [place] a message through [place].   We are unable to tell what your co-pay for medications will be in advance as this is different depending on your insurance coverage. However, we may be able to find a substitute medication at lower cost or fill out paperwork to get insurance to cover a needed medication.   If a prior authorization is required to get your medication covered by your insurance company, please allow [place] 1-2 business days to complete this process.  Drug prices often vary depending on where the prescription is filled and some pharmacies may offer cheaper prices.  The website www.goodrx.com contains coupons for medications through different pharmacies. The prices here do not account for what the cost may be with help from insurance (it may be cheaper with your insurance), but the website can give you the price if you did not use any insurance.  - You can print the associated coupon and take it with  your prescription to the pharmacy.  - You may also stop by our office during regular business hours and pick up a GoodRx coupon card.  - If you need your prescription sent electronically to a different pharmacy, notify our office through [facility] or by phone at [phone] option 4.     Si Usted Necesita Algo Despus de Su Visita  Tambin puede enviarnos un mensaje a travs de Pharmacist, community. Por lo general respondemos a los mensajes de MyChart en el transcurso de 1 a 2  das hbiles.  Para renovar recetas, por favor pida a su farmacia que se ponga en contacto con nuestra oficina. Harland Dingwall de fax es [place] [phone].  Si tiene un asunto urgente cuando la clnica est cerrada y que no puede esperar hasta el siguiente da hbil, puede llamar/localizar a su doctor(a) al nmero que aparece a continuacin.   Por favor, tenga en cuenta que aunque hacemos todo lo posible para estar disponibles para asuntos urgentes fuera del horario de [place], no estamos disponibles las 24 horas del da, los 7 das de la [place].   Si tiene un problema urgente y no puede comunicarse con nosotros, puede optar por buscar atencin mdica  en el consultorio de su doctor(a), en una clnica privada, en un centro de atencin urgente o en una sala de emergencias.  Si tiene Engineering geologist, por favor llame inmediatamente al 911 o vaya a la sala de emergencias.  Nmeros de bper  - Dr. Nehemiah Massed: [phone]  - Dra. Moye: [phone]  - Dra. Nicole Kindred: [phone]  En caso de inclemencias del [place], por favor llame a Johnsie Kindred principal al [phone] para una actualizacin sobre el [place] de cualquier retraso o cierre.  Consejos para la medicacin en dermatologa: Por favor, guarde las cajas en las que vienen los medicamentos de uso tpico para ayudarle a seguir las instrucciones sobre dnde y cmo usarlos. Las farmacias generalmente imprimen las instrucciones del medicamento slo en las cajas y no directamente en los tubos del [place].   Si su medicamento es muy caro, por favor, pngase en contacto con Zigmund Daniel llamando al [phone] y presione la opcin 4 o envenos un mensaje a travs de Pharmacist, community.   No podemos decirle cul ser su copago por los medicamentos por adelantado ya que esto es diferente dependiendo de la cobertura de su seguro. Sin embargo, es posible que podamos encontrar un medicamento sustituto a Electrical engineer un formulario para que el  seguro cubra el medicamento que se considera necesario.   Si se requiere una autorizacin previa para que su compaa de seguros [place] su medicamento, por favor permtanos de 1 a 2 das hbiles para completar este proceso.  Los precios de los medicamentos varan con frecuencia dependiendo del Environmental consultant de dnde se surte la receta y alguna farmacias pueden ofrecer precios ms baratos.  El sitio web www.goodrx.com tiene cupones para medicamentos de Airline pilot. Los precios aqu no tienen en cuenta lo que podra costar con la ayuda del seguro (puede ser ms barato con su seguro), pero el sitio web puede darle el precio si no utiliz Research scientist (physical sciences).  - Puede imprimir el cupn correspondiente y llevarlo con su receta a la farmacia.  - Tambin puede pasar por nuestra oficina durante el horario de atencin regular y Charity fundraiser una tarjeta de cupones de GoodRx.  - Si necesita que su receta se enve electrnicamente a Chiropodist, informe a nuestra oficina a travs [place]  o por telfono llamando al [phone] y presione la opcin 4.

## 2022-02-02 ENCOUNTER — Encounter: Payer: Self-pay | Admitting: Dermatology

## 2022-02-07 NOTE — Progress Notes (Unsigned)
[place]  Telephone:([phone]) [phone] Fax:(336) [phone]  ID: Deanna Gay OB: Jan 22, 1956  MR#: [id number]  GYF#:[id number]  Patient Care Team: Kirk Ruths, MD as PCP - General (Internal Medicine) Lloyd Huger, MD as Consulting Physician (Oncology)   CHIEF COMPLAINT: Iron deficiency anemia.  INTERVAL HISTORY: Patient returns to clinic today for repeat laboratory work, further evaluation, and consideration of additional IV Feraheme.  She currently feels well and is asymptomatic.  She does not complain of any weakness or fatigue today.  She has no neurologic complaints.  She denies any recent fevers or illnesses.  She denies any chest pain, shortness of breath, cough, or hemoptysis.  She denies any nausea, vomiting, constipation, or diarrhea. She has no melena or hematochezia.  She has no urinary complaints.  Patient offers no specific complaints today.  REVIEW OF SYSTEMS:   Review of Systems  Constitutional: Negative.  Negative for fever, malaise/fatigue and weight loss.  Respiratory: Negative.  Negative for cough, hemoptysis and shortness of breath.   Cardiovascular: Negative.  Negative for chest pain and leg swelling.  Gastrointestinal: Negative.  Negative for abdominal pain, blood in stool and melena.  Genitourinary: Negative.  Negative for frequency and hematuria.  Musculoskeletal: Negative.  Negative for myalgias.  Skin: Negative.  Negative for rash.  Neurological: Negative.  Negative for dizziness, focal weakness, weakness and headaches.  Psychiatric/Behavioral: Negative.  The patient is not nervous/anxious.     As per HPI. Otherwise, a complete review of systems is negative.  PAST MEDICAL HISTORY: Past Medical History:  Diagnosis Date   Anemia    Arthritis    Cancer ([place])    cervical   COPD (chronic obstructive pulmonary disease) ([place])    COPD exacerbation ([place]) 07/19/2017   Depression    Diabetes mellitus without complication  ([place])    GERD (gastroesophageal reflux disease)    Hypertension    Schizophrenia ([place])    Sleep apnea     PAST SURGICAL HISTORY: Past Surgical History:  Procedure Laterality Date   APPENDECTOMY     COLONOSCOPY N/A 01/24/2021   Procedure: COLONOSCOPY;  Surgeon: Annamaria Helling, DO;  Location: [facility] ENDOSCOPY;  Service: Gastroenterology;  Laterality: N/A;   COLONOSCOPY WITH PROPOFOL N/A 12/25/2014   Procedure: COLONOSCOPY WITH PROPOFOL;  Surgeon: Josefine Class, MD;  Location: [facility] ENDOSCOPY;  Service: Endoscopy;  Laterality: N/A;   COLONOSCOPY WITH PROPOFOL N/A 01/21/2018   Procedure: COLONOSCOPY WITH PROPOFOL;  Surgeon: Lin Landsman, MD;  Location: [facility] ENDOSCOPY;  Service: Gastroenterology;  Laterality: N/A;   ESOPHAGOGASTRODUODENOSCOPY N/A 01/24/2021   Procedure: ESOPHAGOGASTRODUODENOSCOPY (EGD);  Surgeon: Annamaria Helling, DO;  Location[facility] ENDOSCOPY;  Service: Gastroenterology;  Laterality: N/A;   ESOPHAGOGASTRODUODENOSCOPY (EGD) WITH PROPOFOL N/A 12/25/2014   Procedure: ESOPHAGOGASTRODUODENOSCOPY (EGD) WITH PROPOFOL;  Surgeon: Josefine Class, MD;  Location: [facility] ENDOSCOPY;  Service: Endoscopy;  Laterality: N/A;   ESOPHAGOGASTRODUODENOSCOPY (EGD) WITH PROPOFOL N/A 01/21/2018   Procedure: ESOPHAGOGASTRODUODENOSCOPY (EGD) WITH PROPOFOL;  Surgeon: Lin Landsman, MD;  Location: [facility] ENDOSCOPY;  Service: Gastroenterology;  Laterality: N/A;   GIVENS CAPSULE STUDY N/A 03/02/2018   Procedure: GIVENS CAPSULE STUDY;  Surgeon: Lin Landsman, MD;  Location: [facility] ENDOSCOPY;  Service: Gastroenterology;  Laterality: N/A;   JOINT REPLACEMENT     right hip   TOTAL HIP ARTHROPLASTY Left 05/16/2016   Procedure: LEFT TOTAL HIP ARTHROPLASTY ANTERIOR APPROACH;  Surgeon: Mcarthur Rossetti, MD;  Location: [facility] ORS;  Service: Orthopedics;  Laterality: Left;    FAMILY HISTORY Family  History  Problem Relation Age of Onset   Hypertension Mother    Diabetes Mellitus II  Mother    Breast cancer Neg Hx    Kidney cancer Neg Hx    Bladder Cancer Neg Hx        ADVANCED DIRECTIVES:    HEALTH MAINTENANCE: Social History   Tobacco Use   Smoking status: Every Day    Packs/day: 1.00    Years: 48.00    Total pack years: 48.00    Types: Cigarettes   Smokeless tobacco: Never  Vaping Use   Vaping Use: Some days  Substance Use Topics   Alcohol use: No   Drug use: No     Colonoscopy:  PAP:  Bone density:  Lipid panel:  Allergies  Allergen Reactions   Penicillins Swelling    Has patient had a PCN reaction causing immediate rash, facial/tongue/throat swelling, SOB or lightheadedness with hypotension: Yes Has patient had a PCN reaction causing severe rash involving mucus membranes or skin necrosis: Yes Has patient had a PCN reaction that required hospitalization: No Has patient had a PCN reaction occurring within the last 10 years: No If all of the above answers are "NO", then may proceed with Cephalosporin use.  Has patient had a PCN reaction causing immediate rash, facial/tongue/throat swelling, SOB or lightheadedness with hypotension: Yes Has patient had a PCN reaction causing severe rash involving mucus membranes or skin necrosis: Yes Has patient had a PCN reaction that required hospitalization: No Has patient had a PCN reaction occurring within the last 10 years: No If all of the above answers are "NO", then may proceed with Cephalosporin use. Has patient had a PCN reaction causing immediate rash, faci   Aspirin Other (See Comments)    Ongoing anemia   Iodinated Contrast Media Swelling   Iodine Swelling    Current Outpatient Medications  Medication Sig Dispense Refill   ACCU-CHEK AVIVA PLUS test strip use 1 TEST STRIP to TEST BLOOD SUGAR four times a day  0   albuterol (PROVENTIL HFA;VENTOLIN HFA) 108 (90 BASE) MCG/ACT inhaler Inhale 2 puffs into the lungs every 4 (four) hours as needed for wheezing or shortness of breath.       atorvastatin (LIPITOR) 40 MG tablet Take 40 mg by mouth daily.     BANOPHEN 25 MG capsule SMARTSIG:2 Capsule(s) By Mouth     Blood Glucose Monitoring Suppl (ACCU-CHEK AVIVA PLUS) w/Device KIT See admin instructions.  0   budesonide-formoterol (SYMBICORT) 160-4.5 MCG/ACT inhaler Inhale 2 puffs into the lungs 2 (two) times daily.     Calcium Carb-Cholecalciferol 600-400 MG-UNIT TABS Take 1 tablet by mouth daily.     calcium carbonate (OSCAL) 1500 (600 Ca) MG TABS tablet Take by mouth 2 (two) times daily with a meal.     diltiazem (CARDIZEM CD) 180 MG 24 hr capsule Take 180 mg by mouth at bedtime.      diltiazem (TIAZAC) 180 MG 24 hr capsule Take by mouth.     docusate sodium (COLACE) 100 MG capsule Take 100 mg by mouth 2 (two) times daily.     doxycycline (VIBRA-TABS) 100 MG tablet Take 1 tablet (100 mg total) by mouth 2 (two) times daily. With food and plenty of fluid 20 tablet 0   escitalopram (LEXAPRO) 10 MG tablet Take 10 mg by mouth daily.     fexofenadine (ALLEGRA) 180 MG tablet Take 180 mg by mouth daily.     fluticasone (FLONASE) 50 MCG/ACT nasal spray Place 2 sprays  into both nostrils daily.     furosemide (LASIX) 20 MG tablet Take 20 mg by mouth daily.     hydrochlorothiazide (HYDRODIURIL) 25 MG tablet take 1 tablet by mouth every morning     insulin glargine (LANTUS) 100 UNIT/ML injection Inject 0.1 mLs (10 Units total) into the skin at bedtime. 10 mL 11   insulin lispro (HUMALOG) 100 UNIT/ML injection Please check your sugars before each meal and use lispro per the below sliding scale:  For sugars 151-200: take 2 units For 201-250: take 4units For 251-300: take 6 units For 301-350: take 8 units For 351-400: take 10 units For > 401: call MD 10 mL 11   Insulin Syringe-Needle U-100 31G X 5/16" 0.5 ML MISC use as directed once daily     ipratropium-albuterol (DUONEB) 0.5-2.5 (3) MG/3ML SOLN Take 3 mLs by nebulization every 6 (six) hours as needed.     iron polysaccharides (NIFEREX)  150 MG capsule TAKE 1 CAPSULE BY MOUTH TWICE DAILY 90 capsule 3   losartan (COZAAR) 50 MG tablet Take 50 mg by mouth daily.     Magnesium 250 MG TABS Take by mouth daily.     meloxicam (MOBIC) 15 MG tablet Take 15 mg by mouth daily.     metoprolol succinate (TOPROL-XL) 25 MG 24 hr tablet Take 25 mg by mouth daily.     montelukast (SINGULAIR) 10 MG tablet Take 10 mg by mouth at bedtime.     Multiple Vitamins-Minerals (ZINC PO) Take by mouth.     mupirocin ointment (BACTROBAN) 2 % Apply 1 Application topically 2 (two) times daily. 22 g 0   Naloxone HCl (EVZIO) 0.4 MG/0.4ML SOAJ Inject as directed.     nitroGLYCERIN (NITROSTAT) 0.4 MG SL tablet Place 1 tablet under the tongue every 5 (five) minutes as needed.  0   nitroGLYCERIN (NITROSTAT) 0.4 MG SL tablet Place under the tongue.     omeprazole (PRILOSEC) 20 MG capsule Take 20 mg by mouth 2 (two) times daily.     oxyCODONE-acetaminophen (PERCOCET) 7.5-325 MG tablet Take 1-2 tablets by mouth every 6 (six) hours as needed for severe pain. 30 tablet 0   potassium chloride (KLOR-CON) 10 MEQ tablet Take 10 mEq by mouth daily.     predniSONE (DELTASONE) 10 MG tablet Label  & dispense according to the schedule below. 5 Pills PO for 1 day then, 4 Pills PO for 1 day, 3 Pills PO for 1 day, 2 Pills PO for 1 day, 1 Pill PO for 1 days then STOP. 15 tablet 0   pregabalin (LYRICA) 25 MG capsule Take 25 mg by mouth 2 (two) times daily.     roflumilast (DALIRESP) 500 MCG TABS tablet Take 500 mcg by mouth daily.     tiotropium (SPIRIVA) 18 MCG inhalation capsule Place 18 mcg into inhaler and inhale daily.     triamcinolone (KENALOG) 0.1 % paste Use as directed 1 application in the mouth or throat 2 (two) times daily.     TRULICITY 3.71 GG/2.6RS SOPN Inject 0.75 mg into the skin once a week.     vitamin B-12 (CYANOCOBALAMIN) 500 MCG tablet Take 500 mcg by mouth daily.     fexofenadine (ALLEGRA) 180 MG tablet Take by mouth.     omeprazole (PRILOSEC) 40 MG capsule  Take 40 mg by mouth 2 (two) times daily. (Patient not taking: Reported on 08/08/2021)     No current facility-administered medications for this visit.    OBJECTIVE: Vitals:   02/11/22  1002  BP: (!) 142/68  Pulse: 99  Temp: 98.3 F (36.8 C)     Body mass index is 37.25 kg/m.    ECOG FS:0 - Asymptomatic  General: Well-developed, well-nourished, no acute distress. Eyes: Pink conjunctiva, anicteric sclera. HEENT: Normocephalic, moist mucous membranes. Lungs: No audible wheezing or coughing. Heart: Regular rate and rhythm. Abdomen: Soft, nontender, no obvious distention. Musculoskeletal: No edema, cyanosis, or clubbing. Neuro: Alert, answering all questions appropriately. Cranial nerves grossly intact. Skin: No rashes or petechiae noted. Psych: Normal affect.   LAB RESULTS:  Lab Results  Component Value Date   NA 136 02/10/2022   K 3.7 02/10/2022   CL 99 02/10/2022   CO2 30 02/10/2022   GLUCOSE 190 (H) 02/10/2022   BUN 15 02/10/2022   CREATININE 1.31 (H) 02/10/2022   CALCIUM 8.3 (L) 02/10/2022   PROT 7.6 06/06/2020   ALBUMIN 2.8 (L) 06/06/2020   AST 13 (L) 06/06/2020   ALT 17 06/06/2020   ALKPHOS 70 06/06/2020   BILITOT 0.5 06/06/2020   GFRNONAA 45 (L) 02/10/2022   GFRAA 49 (L) 10/31/2019    Lab Results  Component Value Date   WBC 6.5 02/10/2022   NEUTROABS 4.4 02/10/2022   HGB 12.8 02/10/2022   HCT 39.4 02/10/2022   MCV 85.5 02/10/2022   PLT 232 02/10/2022   Lab Results  Component Value Date   FERRITIN 28 02/10/2022   Lab Results  Component Value Date   IRON 45 02/10/2022   TIBC 391 02/10/2022   IRONPCTSAT 12 02/10/2022      STUDIES: No results found.  ASSESSMENT: Iron deficiency anemia.  PLAN:    1. Iron deficiency anemia: Patient's hemoglobin and iron stores are now within normal limits.  Her most recent colonoscopy and endoscopy on January 24, 2021 revealed multiple polyps, but no other significant pathology.  She does not require  additional IV Feraheme today.  Patient last received treatment on August 22, 2021.  Return to clinic in 6 months with repeat laboratory work, further evaluation, and continuation of treatment if needed.   2.  Hyperglycemia: Chronic and unchanged.  Continue insulin and diabetic medications as prescribed. 3.  Chronic renal insufficiency: Chronic and unchanged.  Patient's most recent creatinine is 1.31.  Patient expressed understanding and was in agreement with this plan. She also understands that She can call clinic at any time with any questions, concerns, or complaints.    Lloyd Huger, MD   02/11/2022 2:18 PM

## 2022-02-10 ENCOUNTER — Inpatient Hospital Stay: Payer: Medicare Other | Attending: Oncology

## 2022-02-10 ENCOUNTER — Other Ambulatory Visit: Payer: Self-pay

## 2022-02-10 DIAGNOSIS — Z79899 Other long term (current) drug therapy: Secondary | ICD-10-CM | POA: Diagnosis not present

## 2022-02-10 DIAGNOSIS — D509 Iron deficiency anemia, unspecified: Secondary | ICD-10-CM | POA: Insufficient documentation

## 2022-02-10 DIAGNOSIS — Z794 Long term (current) use of insulin: Secondary | ICD-10-CM | POA: Insufficient documentation

## 2022-02-10 DIAGNOSIS — E1165 Type 2 diabetes mellitus with hyperglycemia: Secondary | ICD-10-CM | POA: Diagnosis not present

## 2022-02-10 DIAGNOSIS — N189 Chronic kidney disease, unspecified: Secondary | ICD-10-CM | POA: Diagnosis not present

## 2022-02-10 LAB — CBC WITH DIFFERENTIAL/PLATELET
Abs Immature Granulocytes: 0.02 10*3/uL (ref 0.00–0.07)
Basophils Absolute: 0.1 10*3/uL (ref 0.0–0.1)
Basophils Relative: 1 %
Eosinophils Absolute: 0.1 10*3/uL (ref 0.0–0.5)
Eosinophils Relative: 1 %
HCT: 39.4 % (ref 36.0–46.0)
Hemoglobin: 12.8 g/dL (ref 12.0–15.0)
Immature Granulocytes: 0 %
Lymphocytes Relative: 24 %
Lymphs Abs: 1.5 10*3/uL (ref 0.7–4.0)
MCH: 27.8 pg (ref 26.0–34.0)
MCHC: 32.5 g/dL (ref 30.0–36.0)
MCV: 85.5 fL (ref 80.0–100.0)
Monocytes Absolute: 0.4 10*3/uL (ref 0.1–1.0)
Monocytes Relative: 7 %
Neutro Abs: 4.4 10*3/uL (ref 1.7–7.7)
Neutrophils Relative %: 67 %
Platelets: 232 10*3/uL (ref 150–400)
RBC: 4.61 MIL/uL (ref 3.87–5.11)
RDW: 14.4 % (ref 11.5–15.5)
WBC: 6.5 10*3/uL (ref 4.0–10.5)
nRBC: 0 % (ref 0.0–0.2)

## 2022-02-10 LAB — BASIC METABOLIC PANEL
Anion gap: 7 (ref 5–15)
BUN: 15 mg/dL (ref 8–23)
CO2: 30 mmol/L (ref 22–32)
Calcium: 8.3 mg/dL — ABNORMAL LOW (ref 8.9–10.3)
Chloride: 99 mmol/L (ref 98–111)
Creatinine, Ser: 1.31 mg/dL — ABNORMAL HIGH (ref 0.44–1.00)
GFR, Estimated: 45 mL/min — ABNORMAL LOW (ref >60)
Glucose, Bld: 190 mg/dL — ABNORMAL HIGH (ref 70–99)
Potassium: 3.7 mmol/L (ref 3.5–5.1)
Sodium: 136 mmol/L (ref 135–145)

## 2022-02-10 LAB — IRON AND TIBC
Iron: 45 ug/dL (ref 28–170)
Saturation Ratios: 12 % (ref 10.4–31.8)
TIBC: 391 ug/dL (ref 250–450)
UIBC: 346 ug/dL

## 2022-02-10 LAB — FERRITIN: Ferritin: 28 ng/mL (ref 11–307)

## 2022-02-11 ENCOUNTER — Encounter: Payer: Self-pay | Admitting: Oncology

## 2022-02-11 ENCOUNTER — Inpatient Hospital Stay (HOSPITAL_BASED_OUTPATIENT_CLINIC_OR_DEPARTMENT_OTHER): Payer: Medicare Other | Admitting: Oncology

## 2022-02-11 VITALS — BP 142/68 | HR 99 | Temp 98.3°F | Wt 217.0 lb

## 2022-02-11 DIAGNOSIS — D509 Iron deficiency anemia, unspecified: Secondary | ICD-10-CM | POA: Diagnosis not present

## 2022-02-13 ENCOUNTER — Telehealth: Payer: Self-pay | Admitting: *Deleted

## 2022-02-13 NOTE — Telephone Encounter (Signed)
Left message for patient to call back to schedule follow up lung cancer screening CT scan.

## 2022-02-21 ENCOUNTER — Other Ambulatory Visit: Payer: Self-pay

## 2022-02-21 DIAGNOSIS — Z87891 Personal history of nicotine dependence: Secondary | ICD-10-CM

## 2022-02-21 DIAGNOSIS — R911 Solitary pulmonary nodule: Secondary | ICD-10-CM

## 2022-02-21 DIAGNOSIS — F17210 Nicotine dependence, cigarettes, uncomplicated: Secondary | ICD-10-CM

## 2022-03-10 ENCOUNTER — Ambulatory Visit: Payer: Medicare Other

## 2022-03-17 ENCOUNTER — Encounter (INDEPENDENT_AMBULATORY_CARE_PROVIDER_SITE_OTHER): Payer: Self-pay

## 2022-03-18 ENCOUNTER — Ambulatory Visit
Admission: RE | Admit: 2022-03-18 | Discharge: 2022-03-18 | Disposition: A | Discharge status: Home / Self Care | Payer: Medicare Other | Source: Ambulatory Visit | Attending: Acute Care | Admitting: Acute Care

## 2022-03-18 DIAGNOSIS — F17210 Nicotine dependence, cigarettes, uncomplicated: Secondary | ICD-10-CM | POA: Diagnosis present

## 2022-03-18 DIAGNOSIS — Z87891 Personal history of nicotine dependence: Secondary | ICD-10-CM | POA: Insufficient documentation

## 2022-03-18 DIAGNOSIS — R911 Solitary pulmonary nodule: Secondary | ICD-10-CM | POA: Diagnosis present

## 2022-03-20 ENCOUNTER — Telehealth: Payer: Self-pay | Admitting: Acute Care

## 2022-03-20 NOTE — Telephone Encounter (Signed)
I have attempted to call the patient with the results of their  Low Dose CT Chest Lung cancer screening scan. There was no answer. I have left a HIPPA compliant VM requesting the patient call the office for the scan results. I included the office contact information in the message. We will await his return call. If no return call we will continue to call until patient is contacted.   Langley Gauss if this patient returns to call when I am not here can you please ask her if she has been sick?  The area that we see is large and has developed pretty quickly so they are suspicious that it could be infectious or inflammatory.  I will call her back but if she calls first that will be valuable information for me to have.  We will await return call she will most likely need a 1 month follow-up after treatment for suspected infection if she has been sick  Thanks so much

## 2022-03-20 NOTE — Telephone Encounter (Signed)
Received a call from [facility] Radiology for patient's lung cancer screening results. Below is a copy of the impression.   IMPRESSION: 1. Lung-RADS 4Bs, suspicious. Additional imaging evaluation or consultation with Pulmonology or Thoracic Surgery recommended. 18.4 mm, left upper lobe, image 144/4. 2. The S modifier above refers to the new subpleural nodular consolidation in the left upper lobe. For new large nodules that develop on an annual repeat screening CT, a 1 month LDCT may be considered to address potentially infectious or inflammatory conditions 3. Scattered centrilobular ground-glass nodularity within upper lung zone predominance compatible with smoking respiratory bronchiolitis 4. Aortic Atherosclerosis (ICD10-I70.0) and Emphysema (ICD10-J43.9).   These results will be called to the ordering clinician or representative by the Radiologist Assistant, and communication documented in the PACS or [employer].  Will route to Judson Roch and the lung cancer screening program for follow up.

## 2022-03-21 ENCOUNTER — Telehealth: Payer: Self-pay | Admitting: Acute Care

## 2022-03-21 DIAGNOSIS — Z87891 Personal history of nicotine dependence: Secondary | ICD-10-CM

## 2022-03-21 DIAGNOSIS — R911 Solitary pulmonary nodule: Secondary | ICD-10-CM

## 2022-03-21 NOTE — Telephone Encounter (Signed)
I have called the patient with the results of her low dose screening CT Chest. I explained that her scan was read as a Lung RADS 4B, suspicious. There was question by radiology if this could be infectious or inflammatory. The patient states she has not been sick. Just her usual baseline COPD state. No fevers or discolored secretions.I  asked Dr. Patsey Berthold to look at the scan,and she agrees with radiology that we should do a 1 month follow up.  Langley Gauss, please place order for 1 month follow up Low dose CT /chest.  Please fax results of current scan to  PCP , and let them know plan is for 1 month follow up low dose CT Chest. If this remains abnormal and concerning, we will have Dr. Patsey Berthold refer back to Dr. Vella Kohler for follow up.  Please add to tickle list. Thanks so much

## 2022-03-24 NOTE — Telephone Encounter (Signed)
Ct results faxed to PCP with follow up plans for 1 month repeat Chest CT included. Order placed for 1 mth nodule follow up low dose CT .

## 2022-03-24 NOTE — Telephone Encounter (Signed)
See other telephone note from 03/21/2022.

## 2022-03-27 ENCOUNTER — Other Ambulatory Visit: Payer: Self-pay | Admitting: Oncology

## 2022-03-27 NOTE — Telephone Encounter (Signed)
Refilled Ferrex tabs

## 2022-04-17 ENCOUNTER — Ambulatory Visit: Payer: Medicare Other | Attending: Acute Care

## 2022-04-28 NOTE — Telephone Encounter (Signed)
Spoke with pt and scheduled follow up lung screening CT 05/02/22 9:00.

## 2022-05-02 ENCOUNTER — Ambulatory Visit
Admission: RE | Admit: 2022-05-02 | Discharge: 2022-05-02 | Disposition: A | Discharge status: Home / Self Care | Payer: Medicare Other | Source: Ambulatory Visit | Attending: Acute Care | Admitting: Acute Care

## 2022-05-02 DIAGNOSIS — Z87891 Personal history of nicotine dependence: Secondary | ICD-10-CM | POA: Diagnosis present

## 2022-05-02 DIAGNOSIS — R911 Solitary pulmonary nodule: Secondary | ICD-10-CM | POA: Diagnosis present

## 2022-05-05 ENCOUNTER — Other Ambulatory Visit: Payer: Self-pay | Admitting: Acute Care

## 2022-05-05 DIAGNOSIS — F17210 Nicotine dependence, cigarettes, uncomplicated: Secondary | ICD-10-CM

## 2022-05-05 DIAGNOSIS — Z87891 Personal history of nicotine dependence: Secondary | ICD-10-CM

## 2022-05-05 DIAGNOSIS — Z122 Encounter for screening for malignant neoplasm of respiratory organs: Secondary | ICD-10-CM

## 2022-05-07 ENCOUNTER — Telehealth: Payer: Self-pay | Admitting: *Deleted

## 2022-05-07 NOTE — Telephone Encounter (Signed)
Patient called asking to come in to be seen citing weakness, fatigue, no energy and dizziness. Her next appointment is not until March 2024. Please advise

## 2022-05-08 ENCOUNTER — Encounter: Payer: Self-pay | Admitting: Oncology

## 2022-05-08 ENCOUNTER — Other Ambulatory Visit: Payer: Self-pay

## 2022-05-08 ENCOUNTER — Inpatient Hospital Stay: Payer: Medicare Other | Attending: Oncology

## 2022-05-08 DIAGNOSIS — D509 Iron deficiency anemia, unspecified: Secondary | ICD-10-CM

## 2022-05-08 LAB — COMPREHENSIVE METABOLIC PANEL
ALT: 14 U/L (ref 0–44)
AST: 14 U/L — ABNORMAL LOW (ref 15–41)
Albumin: 3.4 g/dL — ABNORMAL LOW (ref 3.5–5.0)
Alkaline Phosphatase: 107 U/L (ref 38–126)
Anion gap: 10 (ref 5–15)
BUN: 24 mg/dL — ABNORMAL HIGH (ref 8–23)
CO2: 29 mmol/L (ref 22–32)
Calcium: 8.3 mg/dL — ABNORMAL LOW (ref 8.9–10.3)
Chloride: 96 mmol/L — ABNORMAL LOW (ref 98–111)
Creatinine, Ser: 1.35 mg/dL — ABNORMAL HIGH (ref 0.44–1.00)
GFR, Estimated: 43 mL/min — ABNORMAL LOW (ref >60)
Glucose, Bld: 287 mg/dL — ABNORMAL HIGH (ref 70–99)
Potassium: 3.4 mmol/L — ABNORMAL LOW (ref 3.5–5.1)
Sodium: 135 mmol/L (ref 135–145)
Total Bilirubin: 0.5 mg/dL (ref 0.3–1.2)
Total Protein: 7.4 g/dL (ref 6.5–8.1)

## 2022-05-08 LAB — CBC WITH DIFFERENTIAL/PLATELET
Abs Immature Granulocytes: 0.01 10*3/uL (ref 0.00–0.07)
Basophils Absolute: 0 10*3/uL (ref 0.0–0.1)
Basophils Relative: 1 %
Eosinophils Absolute: 0.1 10*3/uL (ref 0.0–0.5)
Eosinophils Relative: 1 %
HCT: 40.8 % (ref 36.0–46.0)
Hemoglobin: 13 g/dL (ref 12.0–15.0)
Immature Granulocytes: 0 %
Lymphocytes Relative: 35 %
Lymphs Abs: 2 10*3/uL (ref 0.7–4.0)
MCH: 26.1 pg (ref 26.0–34.0)
MCHC: 31.9 g/dL (ref 30.0–36.0)
MCV: 81.9 fL (ref 80.0–100.0)
Monocytes Absolute: 0.4 10*3/uL (ref 0.1–1.0)
Monocytes Relative: 8 %
Neutro Abs: 3.1 10*3/uL (ref 1.7–7.7)
Neutrophils Relative %: 55 %
Platelets: 224 10*3/uL (ref 150–400)
RBC: 4.98 MIL/uL (ref 3.87–5.11)
RDW: 15.1 % (ref 11.5–15.5)
WBC: 5.6 10*3/uL (ref 4.0–10.5)
nRBC: 0 % (ref 0.0–0.2)

## 2022-05-08 LAB — FERRITIN: Ferritin: 17 ng/mL (ref 11–307)

## 2022-05-08 LAB — IRON AND TIBC
Iron: 58 ug/dL (ref 28–170)
Saturation Ratios: 14 % (ref 10.4–31.8)
TIBC: 430 ug/dL (ref 250–450)
UIBC: 372 ug/dL

## 2022-05-09 ENCOUNTER — Encounter: Payer: Self-pay | Admitting: Nurse Practitioner

## 2022-05-09 ENCOUNTER — Inpatient Hospital Stay (HOSPITAL_BASED_OUTPATIENT_CLINIC_OR_DEPARTMENT_OTHER): Payer: Medicare Other | Admitting: Nurse Practitioner

## 2022-05-09 VITALS — BP 140/73 | HR 111 | Temp 97.5°F | Ht 64.0 in | Wt 221.0 lb

## 2022-05-09 DIAGNOSIS — D631 Anemia in chronic kidney disease: Secondary | ICD-10-CM | POA: Diagnosis not present

## 2022-05-09 DIAGNOSIS — N1832 Chronic kidney disease, stage 3b: Secondary | ICD-10-CM

## 2022-05-09 DIAGNOSIS — D509 Iron deficiency anemia, unspecified: Secondary | ICD-10-CM

## 2022-05-09 NOTE — Progress Notes (Signed)
Deanna Gay  [phone]) [phone] Fax:(336) [phone]  ID: Deanna Gay OB: 04/22/1956  MR#: [id number]  JQB#:[id number]  Patient Care Team: Kirk Ruths, MD as PCP - General (Internal Medicine) Lloyd Huger, MD as Consulting Physician (Oncology)   CHIEF COMPLAINT: Iron deficiency anemia.  INTERVAL HISTORY: Patient returns to clinic today for repeat laboratory work, further evaluation, and consideration of additional IV Feraheme.  She currently feels well and is asymptomatic.  She does not complain of any weakness or fatigue today.  She has no neurologic complaints.  She denies any recent fevers or illnesses.  She denies any chest pain, shortness of breath, cough, or hemoptysis.  She denies any nausea, vomiting, constipation, or diarrhea. She has no melena or hematochezia.  She has no urinary complaints.  Patient offers no specific complaints today.  REVIEW OF SYSTEMS:   Review of Systems  Constitutional: Negative.  Negative for fever, malaise/fatigue and weight loss.  Respiratory: Negative.  Negative for cough, hemoptysis and shortness of breath.   Cardiovascular: Negative.  Negative for chest pain and leg swelling.  Gastrointestinal: Negative.  Negative for abdominal pain, blood in stool and melena.  Genitourinary: Negative.  Negative for frequency and hematuria.  Musculoskeletal: Negative.  Negative for myalgias.  Skin: Negative.  Negative for rash.  Neurological: Negative.  Negative for dizziness, focal weakness, weakness and headaches.  Psychiatric/Behavioral: Negative.  The patient is not nervous/anxious.   As per HPI. Otherwise, a complete review of systems is negative.  PAST MEDICAL HISTORY: Past Medical History:  Diagnosis Date   Anemia    Arthritis    Cancer ([place])    cervical   COPD (chronic obstructive pulmonary disease) ([place])    COPD exacerbation ([place]) 07/19/2017   Depression    Diabetes mellitus without complication ([place])     GERD (gastroesophageal reflux disease)    Hypertension    Schizophrenia ([place])    Sleep apnea     PAST SURGICAL HISTORY: Past Surgical History:  Procedure Laterality Date   APPENDECTOMY     COLONOSCOPY N/A 01/24/2021   Procedure: COLONOSCOPY;  Surgeon: Annamaria Helling, DO;  Location: [facility] ENDOSCOPY;  Service: Gastroenterology;  Laterality: N/A;   COLONOSCOPY WITH PROPOFOL N/A 12/25/2014   Procedure: COLONOSCOPY WITH PROPOFOL;  Surgeon: Josefine Class, MD;  Location[facility] ENDOSCOPY;  Service: Endoscopy;  Laterality: N/A;   COLONOSCOPY WITH PROPOFOL N/A 01/21/2018   Procedure: COLONOSCOPY WITH PROPOFOL;  Surgeon: Lin Landsman, MD;  Location: [facility] ENDOSCOPY;  Service: Gastroenterology;  Laterality: N/A;   ESOPHAGOGASTRODUODENOSCOPY N/A 01/24/2021   Procedure: ESOPHAGOGASTRODUODENOSCOPY (EGD);  Surgeon: Annamaria Helling, DO;  Location: [facility] ENDOSCOPY;  Service: Gastroenterology;  Laterality: N/A;   ESOPHAGOGASTRODUODENOSCOPY (EGD) WITH PROPOFOL N/A 12/25/2014   Procedure: ESOPHAGOGASTRODUODENOSCOPY (EGD) WITH PROPOFOL;  Surgeon: Josefine Class, MD;  Location: [facility] ENDOSCOPY;  Service: Endoscopy;  Laterality: N/A;   ESOPHAGOGASTRODUODENOSCOPY (EGD) WITH PROPOFOL N/A 01/21/2018   Procedure: ESOPHAGOGASTRODUODENOSCOPY (EGD) WITH PROPOFOL;  Surgeon: Lin Landsman, MD;  Location: [facility] ENDOSCOPY;  Service: Gastroenterology;  Laterality: N/A;   GIVENS CAPSULE STUDY N/A 03/02/2018   Procedure: GIVENS CAPSULE STUDY;  Surgeon: Lin Landsman, MD;  Location: [facility] ENDOSCOPY;  Service: Gastroenterology;  Laterality: N/A;   JOINT REPLACEMENT     right hip   TOTAL HIP ARTHROPLASTY Left 05/16/2016   Procedure: LEFT TOTAL HIP ARTHROPLASTY ANTERIOR APPROACH;  Surgeon: Mcarthur Rossetti, MD;  Location: [facility] ORS;  Service: Orthopedics;  Laterality: Left;    FAMILY HISTORY Family History  Problem Relation Age of Onset   Hypertension Mother    Diabetes Mellitus II Mother     Breast cancer Neg Hx    Kidney cancer Neg Hx    Bladder Cancer Neg Hx        ADVANCED DIRECTIVES:    HEALTH MAINTENANCE: Social History   Tobacco Use   Smoking status: Every Day    Packs/day: 1.00    Years: 48.00    Total pack years: 48.00    Types: Cigarettes   Smokeless tobacco: Never  Vaping Use   Vaping Use: Some days  Substance Use Topics   Alcohol use: No   Drug use: No     Colonoscopy:  PAP:  Bone density:  Lipid panel:  Allergies  Allergen Reactions   Penicillins Swelling    Has patient had a PCN reaction causing immediate rash, facial/tongue/throat swelling, SOB or lightheadedness with hypotension: Yes Has patient had a PCN reaction causing severe rash involving mucus membranes or skin necrosis: Yes Has patient had a PCN reaction that required hospitalization: No Has patient had a PCN reaction occurring within the last 10 years: No If all of the above answers are "NO", then may proceed with Cephalosporin use.  Has patient had a PCN reaction causing immediate rash, facial/tongue/throat swelling, SOB or lightheadedness with hypotension: Yes Has patient had a PCN reaction causing severe rash involving mucus membranes or skin necrosis: Yes Has patient had a PCN reaction that required hospitalization: No Has patient had a PCN reaction occurring within the last 10 years: No If all of the above answers are "NO", then may proceed with Cephalosporin use. Has patient had a PCN reaction causing immediate rash, faci   Aspirin Other (See Comments)    Ongoing anemia   Iodinated Contrast Media Swelling   Iodine Swelling    Current Outpatient Medications  Medication Sig Dispense Refill   ACCU-CHEK AVIVA PLUS test strip use 1 TEST STRIP to TEST BLOOD SUGAR four times a day  0   albuterol (PROVENTIL HFA;VENTOLIN HFA) 108 (90 BASE) MCG/ACT inhaler Inhale 2 puffs into the lungs every 4 (four) hours as needed for wheezing or shortness of breath.      atorvastatin (LIPITOR)  40 MG tablet Take 40 mg by mouth daily.     BANOPHEN 25 MG capsule SMARTSIG:2 Capsule(s) By Mouth     Blood Glucose Monitoring Suppl (ACCU-CHEK AVIVA PLUS) w/Device KIT See admin instructions.  0   budesonide-formoterol (SYMBICORT) 160-4.5 MCG/ACT inhaler Inhale 2 puffs into the lungs 2 (two) times daily.     Calcium Carb-Cholecalciferol 600-400 MG-UNIT TABS Take 1 tablet by mouth daily.     calcium carbonate (OSCAL) 1500 (600 Ca) MG TABS tablet Take by mouth 2 (two) times daily with a meal.     diltiazem (CARDIZEM CD) 180 MG 24 hr capsule Take 180 mg by mouth at bedtime.      diltiazem (TIAZAC) 180 MG 24 hr capsule Take by mouth.     docusate sodium (COLACE) 100 MG capsule Take 100 mg by mouth 2 (two) times daily.     escitalopram (LEXAPRO) 10 MG tablet Take 10 mg by mouth daily.     fexofenadine (ALLEGRA) 180 MG tablet Take 180 mg by mouth daily.     fluticasone (FLONASE) 50 MCG/ACT nasal spray Place 2 sprays into both nostrils daily.     furosemide (LASIX) 20 MG tablet Take 20 mg by mouth daily.     hydrochlorothiazide (HYDRODIURIL) 25 MG tablet take 1  tablet by mouth every morning     insulin glargine (LANTUS) 100 UNIT/ML injection Inject 0.1 mLs (10 Units total) into the skin at bedtime. 10 mL 11   insulin lispro (HUMALOG) 100 UNIT/ML injection Please check your sugars before each meal and use lispro per the below sliding scale:  For sugars 151-200: take 2 units For 201-250: take 4units For 251-300: take 6 units For 301-350: take 8 units For 351-400: take 10 units For > 401: call MD 10 mL 11   Insulin Syringe-Needle U-100 31G X 5/16" 0.5 ML MISC use as directed once daily     ipratropium-albuterol (DUONEB) 0.5-2.5 (3) MG/3ML SOLN Take 3 mLs by nebulization every 6 (six) hours as needed.     iron polysaccharides (FERREX 150) 150 MG capsule TAKE 1 CAPSULE BY MOUTH TWICE DAILY 200 capsule 1   losartan (COZAAR) 50 MG tablet Take 50 mg by mouth daily.     Magnesium 250 MG TABS Take by  mouth daily.     meloxicam (MOBIC) 15 MG tablet Take 15 mg by mouth daily.     metoprolol succinate (TOPROL-XL) 25 MG 24 hr tablet Take 25 mg by mouth daily.     montelukast (SINGULAIR) 10 MG tablet Take 10 mg by mouth at bedtime.     Multiple Vitamins-Minerals (ZINC PO) Take by mouth.     mupirocin ointment (BACTROBAN) 2 % Apply 1 Application topically 2 (two) times daily. 22 g 0   Naloxone HCl (EVZIO) 0.4 MG/0.4ML SOAJ Inject as directed.     nitroGLYCERIN (NITROSTAT) 0.4 MG SL tablet Place 1 tablet under the tongue every 5 (five) minutes as needed.  0   nitroGLYCERIN (NITROSTAT) 0.4 MG SL tablet Place under the tongue.     omeprazole (PRILOSEC) 20 MG capsule Take 20 mg by mouth 2 (two) times daily.     omeprazole (PRILOSEC) 40 MG capsule Take 40 mg by mouth 2 (two) times daily.     oxyCODONE-acetaminophen (PERCOCET) 7.5-325 MG tablet Take 1-2 tablets by mouth every 6 (six) hours as needed for severe pain. 30 tablet 0   potassium chloride (KLOR-CON) 10 MEQ tablet Take 10 mEq by mouth daily.     predniSONE (DELTASONE) 10 MG tablet Label  & dispense according to the schedule below. 5 Pills PO for 1 day then, 4 Pills PO for 1 day, 3 Pills PO for 1 day, 2 Pills PO for 1 day, 1 Pill PO for 1 days then STOP. 15 tablet 0   pregabalin (LYRICA) 25 MG capsule Take 25 mg by mouth 2 (two) times daily.     roflumilast (DALIRESP) 500 MCG TABS tablet Take 500 mcg by mouth daily.     tiotropium (SPIRIVA) 18 MCG inhalation capsule Place 18 mcg into inhaler and inhale daily.     triamcinolone (KENALOG) 0.1 % paste Use as directed 1 application in the mouth or throat 2 (two) times daily.     TRULICITY 5.91 MB/8.4YK SOPN Inject 0.75 mg into the skin once a week.     vitamin B-12 (CYANOCOBALAMIN) 500 MCG tablet Take 500 mcg by mouth daily.     fexofenadine (ALLEGRA) 180 MG tablet Take by mouth.     No current facility-administered medications for this visit.    OBJECTIVE: Vitals:   05/09/22 1325  BP: (!)  140/73  Pulse: (!) 111  Temp: (!) 97.5 F (36.4 C)  SpO2: 98%     Body mass index is 37.93 kg/m.    ECOG FS:0 -  Asymptomatic  General: Well-developed, well-nourished, no acute distress. Eyes: Pink conjunctiva, anicteric sclera. HEENT: Normocephalic, moist mucous membranes. Lungs: No audible wheezing or coughing. Heart: Regular rate and rhythm. Abdomen: Soft, nontender, no obvious distention. Musculoskeletal: No edema, cyanosis, or clubbing. Neuro: Alert, answering all questions appropriately. Cranial nerves grossly intact. Skin: No rashes or petechiae noted. Psych: Normal affect.   LAB RESULTS:  Lab Results  Component Value Date   NA 135 05/08/2022   K 3.4 (L) 05/08/2022   CL 96 (L) 05/08/2022   CO2 29 05/08/2022   GLUCOSE 287 (H) 05/08/2022   BUN 24 (H) 05/08/2022   CREATININE 1.35 (H) 05/08/2022   CALCIUM 8.3 (L) 05/08/2022   PROT 7.4 05/08/2022   ALBUMIN 3.4 (L) 05/08/2022   AST 14 (L) 05/08/2022   ALT 14 05/08/2022   ALKPHOS 107 05/08/2022   BILITOT 0.5 05/08/2022   GFRNONAA 43 (L) 05/08/2022   GFRAA 49 (L) 10/31/2019    Lab Results  Component Value Date   WBC 5.6 05/08/2022   NEUTROABS 3.1 05/08/2022   HGB 13.0 05/08/2022   HCT 40.8 05/08/2022   MCV 81.9 05/08/2022   PLT 224 05/08/2022   Lab Results  Component Value Date   FERRITIN 17 05/08/2022   Lab Results  Component Value Date   IRON 58 05/08/2022   TIBC 430 05/08/2022   IRONPCTSAT 14 05/08/2022     STUDIES: CT CHEST LCS NODULE F/U LOW DOSE WO CONTRAST  Result Date: 05/04/2022 CLINICAL DATA:  66 year old asymptomatic female current smoker presents for short-term follow-up of a left upper lobe nodule. Additional history of cervical cancer. EXAM: CT CHEST WITHOUT CONTRAST FOR LUNG CANCER SCREENING NODULE FOLLOW-UP TECHNIQUE: Multidetector CT imaging of the chest was performed following the standard protocol without IV contrast. RADIATION DOSE REDUCTION: This exam was performed according to the  departmental dose-optimization program which includes automated exposure control, adjustment of the mA and/or kV according to patient size and/or use of iterative reconstruction technique. COMPARISON:  03/18/2022 screening chest CT. FINDINGS: Cardiovascular: Normal heart size. No significant pericardial effusion/thickening. Atherosclerotic nonaneurysmal thoracic aorta. Dilated main pulmonary artery (3.6 cm diameter). Mediastinum/Nodes: No significant thyroid nodules. Unremarkable esophagus. No pathologically enlarged axillary, mediastinal or hilar lymph nodes, noting limited sensitivity for the detection of hilar adenopathy on this noncontrast study. Lungs/Pleura: No pneumothorax. No pleural effusion. Mild paraseptal and centrilobular emphysema with diffuse bronchial wall thickening. No acute consolidative airspace disease or lung masses. Previously described 18.4 mm nodular opacity in the anteromedial left upper lobe has resolved. No significant growth of any of the other previously visualized scattered small bilateral pulmonary nodules. No new significant pulmonary nodules. Upper abdomen: No acute abnormality. Musculoskeletal: No aggressive appearing focal osseous lesions. Moderate thoracic spondylosis. IMPRESSION: 1. Lung-RADS 2, benign appearance or behavior. Continue annual screening with low-dose chest CT without contrast in 12 months. 2. Dilated main pulmonary artery, suggesting pulmonary arterial hypertension. 3. Aortic Atherosclerosis (ICD10-I70.0) and Emphysema (ICD10-J43.9). Electronically Signed   By: Ilona Sorrel M.D.   On: 05/04/2022 13:48   ASSESSMENT: Iron deficiency anemia.  PLAN:  1. Iron deficiency anemia and anemia of CKD stage 3b:  Patient last received treatment on August 22, 2021. Her most recent colonoscopy and endoscopy on January 24, 2021 revealed multiple polyps, but no other significant pathology.  Patient's hemoglobin remains normal. Ferritin down trending gradually. Remains normal  but decreased. Symptomatic. Proceed with IV Feraheme x 1.  Return to clinic as scheduled.   2.  Hyperglycemia: Chronic. Worse. Glucose 287 today.  Continue insulin and diabetic medications as prescribed as well as dietary discrimination.  3.  Chronic renal insufficiency/ckd stage 3b: GFR 43 today. Chronic and unchanged.  Patient's most recent creatinine is 1.35. 4. COPD- unfortunately, continues to smoke.  5. Sleep apnea- may be contributing to fatigue. Encouraged compliance with cpap.  6. Obesity- encouraged weight loss.   Disposition:  Feraheme x 1 Rtc as scheduled. Patient prefers to see Dr Grayland Ormond  Patient expressed understanding and was in agreement with this plan. She also understands that She can call clinic at any time with any questions, concerns, or complaints.   Thank you for allowing me to participate in the care of this patient.   Verlon Au, NP 05/09/2022

## 2022-05-21 ENCOUNTER — Inpatient Hospital Stay: Payer: Medicare Other | Attending: Oncology

## 2022-05-21 VITALS — BP 144/71 | HR 91 | Temp 95.0°F | Resp 20

## 2022-05-21 DIAGNOSIS — D509 Iron deficiency anemia, unspecified: Secondary | ICD-10-CM | POA: Diagnosis present

## 2022-05-21 MED ORDER — SODIUM CHLORIDE 0.9 % IV SOLN
510.0000 mg | Freq: Once | INTRAVENOUS | Status: AC
  Administered 2022-05-21: 510 mg via INTRAVENOUS
  Filled 2022-05-21: qty 510

## 2022-05-21 MED ORDER — SODIUM CHLORIDE 0.9 % IV SOLN
Freq: Once | INTRAVENOUS | Status: AC
  Filled 2022-05-21: qty 250

## 2022-05-21 NOTE — Patient Instructions (Signed)

## 2022-05-29 ENCOUNTER — Encounter: Payer: Self-pay | Admitting: Oncology

## 2022-07-12 ENCOUNTER — Encounter: Payer: Self-pay | Admitting: Oncology

## 2022-08-08 ENCOUNTER — Other Ambulatory Visit: Payer: Self-pay

## 2022-08-08 DIAGNOSIS — D509 Iron deficiency anemia, unspecified: Secondary | ICD-10-CM

## 2022-08-11 ENCOUNTER — Inpatient Hospital Stay: Payer: 59

## 2022-08-11 ENCOUNTER — Inpatient Hospital Stay: Payer: 59 | Attending: Oncology

## 2022-08-11 DIAGNOSIS — D509 Iron deficiency anemia, unspecified: Secondary | ICD-10-CM | POA: Insufficient documentation

## 2022-08-11 DIAGNOSIS — Z794 Long term (current) use of insulin: Secondary | ICD-10-CM | POA: Diagnosis not present

## 2022-08-11 DIAGNOSIS — E1122 Type 2 diabetes mellitus with diabetic chronic kidney disease: Secondary | ICD-10-CM | POA: Insufficient documentation

## 2022-08-11 DIAGNOSIS — N189 Chronic kidney disease, unspecified: Secondary | ICD-10-CM | POA: Diagnosis not present

## 2022-08-11 LAB — CBC WITH DIFFERENTIAL (CANCER CENTER ONLY)
Abs Immature Granulocytes: 0.02 10*3/uL (ref 0.00–0.07)
Basophils Absolute: 0.1 10*3/uL (ref 0.0–0.1)
Basophils Relative: 1 %
Eosinophils Absolute: 0.1 10*3/uL (ref 0.0–0.5)
Eosinophils Relative: 1 %
HCT: 42.6 % (ref 36.0–46.0)
Hemoglobin: 13.4 g/dL (ref 12.0–15.0)
Immature Granulocytes: 0 %
Lymphocytes Relative: 32 %
Lymphs Abs: 2 10*3/uL (ref 0.7–4.0)
MCH: 26.6 pg (ref 26.0–34.0)
MCHC: 31.5 g/dL (ref 30.0–36.0)
MCV: 84.7 fL (ref 80.0–100.0)
Monocytes Absolute: 0.4 10*3/uL (ref 0.1–1.0)
Monocytes Relative: 7 %
Neutro Abs: 3.7 10*3/uL (ref 1.7–7.7)
Neutrophils Relative %: 59 %
Platelet Count: 235 10*3/uL (ref 150–400)
RBC: 5.03 MIL/uL (ref 3.87–5.11)
RDW: 15.4 % (ref 11.5–15.5)
WBC Count: 6.3 10*3/uL (ref 4.0–10.5)
nRBC: 0 % (ref 0.0–0.2)

## 2022-08-11 LAB — IRON AND TIBC
Iron: 52 ug/dL (ref 28–170)
Saturation Ratios: 14 % (ref 10.4–31.8)
TIBC: 365 ug/dL (ref 250–450)
UIBC: 313 ug/dL

## 2022-08-11 LAB — FERRITIN: Ferritin: 30 ng/mL (ref 11–307)

## 2022-08-11 MED FILL — Ferumoxytol Inj 510 MG/17ML (30 MG/ML) (Elemental Fe): INTRAVENOUS | Qty: 17 | Status: AC

## 2022-08-12 ENCOUNTER — Inpatient Hospital Stay (HOSPITAL_BASED_OUTPATIENT_CLINIC_OR_DEPARTMENT_OTHER): Payer: 59 | Admitting: Oncology

## 2022-08-12 ENCOUNTER — Inpatient Hospital Stay: Payer: 59

## 2022-08-12 ENCOUNTER — Encounter: Payer: Self-pay | Admitting: Oncology

## 2022-08-12 ENCOUNTER — Inpatient Hospital Stay: Payer: 59 | Admitting: Nurse Practitioner

## 2022-08-12 VITALS — BP 113/60 | HR 89 | Temp 96.2°F | Resp 18 | Ht 64.0 in | Wt 214.0 lb

## 2022-08-12 DIAGNOSIS — D509 Iron deficiency anemia, unspecified: Secondary | ICD-10-CM | POA: Diagnosis not present

## 2022-08-12 NOTE — Progress Notes (Signed)
[place]  [phone] [phone]  ID: [name] OB: 1956-05-09  MR#: [id number]  [id number]  Patient Care Team: Kirk Ruths, MD as PCP - General (Internal Medicine) Lloyd Huger, MD as Consulting Physician (Oncology)   CHIEF COMPLAINT: Iron deficiency anemia.  INTERVAL HISTORY: Patient returns to clinic today for repeat laboratory work, further evaluation, and consideration of additional IV Feraheme.  She currently feels well and is asymptomatic.  She does not complain of any weakness or fatigue.  She has no neurologic complaints.  She denies any recent fevers or illnesses.  She denies any chest pain, shortness of breath, cough, or hemoptysis.  She denies any nausea, vomiting, constipation, or diarrhea. She has no melena or hematochezia.  She has no urinary complaints.  Patient offers no further specific complaints today.  REVIEW OF SYSTEMS:   Review of Systems  Constitutional: Negative.  Negative for fever, malaise/fatigue and weight loss.  Respiratory: Negative.  Negative for cough, hemoptysis and shortness of breath.   Cardiovascular: Negative.  Negative for chest pain and leg swelling.  Gastrointestinal: Negative.  Negative for abdominal pain, blood in stool and melena.  Genitourinary: Negative.  Negative for frequency and hematuria.  Musculoskeletal: Negative.  Negative for myalgias.  Skin: Negative.  Negative for rash.  Neurological: Negative.  Negative for dizziness, focal weakness, weakness and headaches.  Psychiatric/Behavioral: Negative.  The patient is not nervous/anxious.     As per HPI. Otherwise, a complete review of systems is negative.  PAST MEDICAL HISTORY: Past Medical History:  Diagnosis Date   Anemia    Arthritis    Cancer ([address])    cervical   COPD (chronic obstructive pulmonary disease) ([place])    COPD exacerbation ([place]) 07/19/2017   Depression    Diabetes mellitus without complication  ([place])    GERD (gastroesophageal reflux disease)    Hypertension    Schizophrenia ([place])    Sleep apnea     PAST SURGICAL HISTORY: Past Surgical History:  Procedure Laterality Date   APPENDECTOMY     COLONOSCOPY N/A 01/24/2021   Procedure: COLONOSCOPY;  Surgeon: Annamaria Helling, DO;  Location: [facility] ENDOSCOPY;  Service: Gastroenterology;  Laterality: N/A;   COLONOSCOPY WITH PROPOFOL N/A 12/25/2014   Procedure: COLONOSCOPY WITH PROPOFOL;  Surgeon: Josefine Class, MD;  Location: [facility] ENDOSCOPY;  Service: Endoscopy;  Laterality: N/A;   COLONOSCOPY WITH PROPOFOL N/A 01/21/2018   Procedure: COLONOSCOPY WITH PROPOFOL;  Surgeon: Lin Landsman, MD;  Location: [facility] ENDOSCOPY;  Service: Gastroenterology;  Laterality: N/A;   ESOPHAGOGASTRODUODENOSCOPY N/A 01/24/2021   Procedure: ESOPHAGOGASTRODUODENOSCOPY (EGD);  Surgeon: Annamaria Helling, DO;  Location: [facility] ENDOSCOPY;  Service: Gastroenterology;  Laterality: N/A;   ESOPHAGOGASTRODUODENOSCOPY (EGD) WITH PROPOFOL N/A 12/25/2014   Procedure: ESOPHAGOGASTRODUODENOSCOPY (EGD) WITH PROPOFOL;  Surgeon: Josefine Class, MD;  Location: [facility] ENDOSCOPY;  Service: Endoscopy;  Laterality: N/A;   ESOPHAGOGASTRODUODENOSCOPY (EGD) WITH PROPOFOL N/A 01/21/2018   Procedure: ESOPHAGOGASTRODUODENOSCOPY (EGD) WITH PROPOFOL;  Surgeon: Lin Landsman, MD;  Location: [facility] ENDOSCOPY;  Service: Gastroenterology;  Laterality: N/A;   GIVENS CAPSULE STUDY N/A 03/02/2018   Procedure: GIVENS CAPSULE STUDY;  Surgeon: Lin Landsman, MD;  Location: [facility] ENDOSCOPY;  Service: Gastroenterology;  Laterality: N/A;   JOINT REPLACEMENT     right hip   TOTAL HIP ARTHROPLASTY Left 05/16/2016   Procedure: LEFT TOTAL HIP ARTHROPLASTY ANTERIOR APPROACH;  Surgeon: Mcarthur Rossetti, MD;  Location: [facility] ORS;  Service: Orthopedics;  Laterality: Left;    FAMILY HISTORY Family  History  Problem Relation Age of Onset   Hypertension Mother    Diabetes Mellitus II  Mother    Breast cancer Neg Hx    Kidney cancer Neg Hx    Bladder Cancer Neg Hx        ADVANCED DIRECTIVES:    HEALTH MAINTENANCE: Social History   Tobacco Use   Smoking status: Every Day    Packs/day: 1.00    Years: 48.00    Additional pack years: 0.00    Total pack years: 48.00    Types: Cigarettes   Smokeless tobacco: Never  Vaping Use   Vaping Use: Some days  Substance Use Topics   Alcohol use: No   Drug use: No     Colonoscopy:  PAP:  Bone density:  Lipid panel:  Allergies  Allergen Reactions   Penicillins Swelling    Has patient had a PCN reaction causing immediate rash, facial/tongue/throat swelling, SOB or lightheadedness with hypotension: Yes Has patient had a PCN reaction causing severe rash involving mucus membranes or skin necrosis: Yes Has patient had a PCN reaction that required hospitalization: No Has patient had a PCN reaction occurring within the last 10 years: No If all of the above answers are "NO", then may proceed with Cephalosporin use.  Has patient had a PCN reaction causing immediate rash, facial/tongue/throat swelling, SOB or lightheadedness with hypotension: Yes Has patient had a PCN reaction causing severe rash involving mucus membranes or skin necrosis: Yes Has patient had a PCN reaction that required hospitalization: No Has patient had a PCN reaction occurring within the last 10 years: No If all of the above answers are "NO", then may proceed with Cephalosporin use. Has patient had a PCN reaction causing immediate rash, faci   Aspirin Other (See Comments)    Ongoing anemia   Iodinated Contrast Media Swelling   Iodine Swelling    Current Outpatient Medications  Medication Sig Dispense Refill   ACCU-CHEK AVIVA PLUS test strip use 1 TEST STRIP to TEST BLOOD SUGAR four times a day  0   albuterol (PROVENTIL HFA;VENTOLIN HFA) 108 (90 BASE) MCG/ACT inhaler Inhale 2 puffs into the lungs every 4 (four) hours as needed for wheezing or  shortness of breath.      atorvastatin (LIPITOR) 40 MG tablet Take 40 mg by mouth daily.     BANOPHEN 25 MG capsule SMARTSIG:2 Capsule(s) By Mouth     Blood Glucose Monitoring Suppl (ACCU-CHEK AVIVA PLUS) w/Device KIT See admin instructions.  0   budesonide-formoterol (SYMBICORT) 160-4.5 MCG/ACT inhaler Inhale 2 puffs into the lungs 2 (two) times daily.     Calcium Carb-Cholecalciferol 600-400 MG-UNIT TABS Take 1 tablet by mouth daily.     calcium carbonate (OSCAL) 1500 (600 Ca) MG TABS tablet Take by mouth 2 (two) times daily with a meal.     celecoxib (CELEBREX) 200 MG capsule Take 200 mg by mouth every 12 (twelve) hours.     cyclobenzaprine (FLEXERIL) 10 MG tablet Take 10 mg by mouth at bedtime.     diltiazem (CARDIZEM CD) 180 MG 24 hr capsule Take 180 mg by mouth at bedtime.      diltiazem (TIAZAC) 180 MG 24 hr capsule Take by mouth.     docusate sodium (COLACE) 100 MG capsule Take 100 mg by mouth 2 (two) times daily.     escitalopram (LEXAPRO) 10 MG tablet Take 10 mg by mouth daily.     fexofenadine (ALLEGRA) 180 MG tablet Take 180 mg by mouth daily.  fluticasone (FLONASE) 50 MCG/ACT nasal spray Place 2 sprays into both nostrils daily.     furosemide (LASIX) 20 MG tablet Take 20 mg by mouth daily.     hydrochlorothiazide (HYDRODIURIL) 25 MG tablet take 1 tablet by mouth every morning     insulin glargine (LANTUS) 100 UNIT/ML injection Inject 0.1 mLs (10 Units total) into the skin at bedtime. 10 mL 11   insulin lispro (HUMALOG) 100 UNIT/ML injection Please check your sugars before each meal and use lispro per the below sliding scale:  For sugars 151-200: take 2 units For 201-250: take 4units For 251-300: take 6 units For 301-350: take 8 units For 351-400: take 10 units For > 401: call MD 10 mL 11   Insulin Syringe-Needle U-100 31G X 5/16" 0.5 ML MISC use as directed once daily     ipratropium-albuterol (DUONEB) 0.5-2.5 (3) MG/3ML SOLN Take 3 mLs by nebulization every 6 (six) hours  as needed.     iron polysaccharides (FERREX 150) 150 MG capsule TAKE 1 CAPSULE BY MOUTH TWICE DAILY 200 capsule 1   JARDIANCE 10 MG TABS tablet Take 10 mg by mouth daily.     losartan (COZAAR) 50 MG tablet Take 50 mg by mouth daily.     Magnesium 250 MG TABS Take by mouth daily.     meloxicam (MOBIC) 15 MG tablet Take 15 mg by mouth daily.     metoprolol succinate (TOPROL-XL) 25 MG 24 hr tablet Take 25 mg by mouth daily.     montelukast (SINGULAIR) 10 MG tablet Take 10 mg by mouth at bedtime.     Multiple Vitamins-Minerals (ZINC PO) Take by mouth.     mupirocin ointment (BACTROBAN) 2 % Apply 1 Application topically 2 (two) times daily. 22 g 0   Naloxone HCl (EVZIO) 0.4 MG/0.4ML SOAJ Inject as directed.     nitroGLYCERIN (NITROSTAT) 0.4 MG SL tablet Place 1 tablet under the tongue every 5 (five) minutes as needed.  0   nitroGLYCERIN (NITROSTAT) 0.4 MG SL tablet Place under the tongue.     omeprazole (PRILOSEC) 40 MG capsule Take 40 mg by mouth 2 (two) times daily.     Oxycodone HCl 10 MG TABS Take 10 mg by mouth every 6 (six) hours.     oxyCODONE-acetaminophen (PERCOCET) 7.5-325 MG tablet Take 1-2 tablets by mouth every 6 (six) hours as needed for severe pain. 30 tablet 0   potassium chloride (KLOR-CON) 10 MEQ tablet Take 10 mEq by mouth daily.     pregabalin (LYRICA) 25 MG capsule Take 25 mg by mouth 2 (two) times daily.     roflumilast (DALIRESP) 500 MCG TABS tablet Take 500 mcg by mouth daily.     tiotropium (SPIRIVA) 18 MCG inhalation capsule Place 18 mcg into inhaler and inhale daily.     triamcinolone (KENALOG) 0.1 % paste Use as directed 1 application in the mouth or throat 2 (two) times daily.     vitamin B-12 (CYANOCOBALAMIN) 500 MCG tablet Take 500 mcg by mouth daily.     predniSONE (DELTASONE) 10 MG tablet Label  & dispense according to the schedule below. 5 Pills PO for 1 day then, 4 Pills PO for 1 day, 3 Pills PO for 1 day, 2 Pills PO for 1 day, 1 Pill PO for 1 days then STOP.  (Patient not taking: Reported on 08/12/2022) 15 tablet 0   TRULICITY [id number] SOPN Inject 0.75 mg into the skin once a week. (Patient not taking: Reported on 08/12/2022)  No current facility-administered medications for this visit.    OBJECTIVE: Vitals:   08/12/22 1303  BP: 113/60  Pulse: 89  Resp: 18  Temp: (!) 96.2 F (35.7 C)  SpO2: 98%     Body mass index is 36.73 kg/m.    ECOG FS:0 - Asymptomatic  General: Well-developed, well-nourished, no acute distress. Eyes: Pink conjunctiva, anicteric sclera. HEENT: Normocephalic, moist mucous membranes. Lungs: No audible wheezing or coughing. Heart: Regular rate and rhythm. Abdomen: Soft, nontender, no obvious distention. Musculoskeletal: No edema, cyanosis, or clubbing. Neuro: Alert, answering all questions appropriately. Cranial nerves grossly intact. Skin: No rashes or petechiae noted. Psych: Normal affect.  LAB RESULTS:  Lab Results  Component Value Date   NA 135 05/08/2022   K 3.4 (L) 05/08/2022   CL 96 (L) 05/08/2022   CO2 29 05/08/2022   GLUCOSE 287 (H) 05/08/2022   BUN 24 (H) 05/08/2022   CREATININE 1.35 (H) 05/08/2022   CALCIUM 8.3 (L) 05/08/2022   PROT 7.4 05/08/2022   ALBUMIN 3.4 (L) 05/08/2022   AST 14 (L) 05/08/2022   ALT 14 05/08/2022   ALKPHOS 107 05/08/2022   BILITOT 0.5 05/08/2022   GFRNONAA 43 (L) 05/08/2022   GFRAA 49 (L) 10/31/2019    Lab Results  Component Value Date   WBC 6.3 08/11/2022   NEUTROABS 3.7 08/11/2022   HGB 13.4 08/11/2022   HCT 42.6 08/11/2022   MCV 84.7 08/11/2022   PLT 235 08/11/2022   Lab Results  Component Value Date   FERRITIN 30 08/11/2022   Lab Results  Component Value Date   IRON 52 08/11/2022   TIBC 365 08/11/2022   IRONPCTSAT 14 08/11/2022      STUDIES: No results found.  ASSESSMENT: Iron deficiency anemia.  PLAN:    1. Iron deficiency anemia: Patient's hemoglobin and iron stores continue to be within normal limits. Her most recent colonoscopy  and endoscopy on January 24, 2021 revealed multiple polyps, but no other significant pathology.  She does not require additional IV Feraheme today.  Patient last received treatment on May 21, 2022.  Return to clinic in 6 months for routine evaluation and continuation of treatment if needed.    2.  Hyperglycemia: Chronic and unchanged.  Continue insulin and diabetic medications as prescribed. 3.  Chronic renal insufficiency: Chronic and unchanged.  Patient's most recent creatinine is 1.35.  I spent a total of 20 minutes reviewing chart data, face-to-face evaluation with the patient, counseling and coordination of care as detailed above.  Patient expressed understanding and was in agreement with this plan. She also understands that She can call clinic at any time with any questions, concerns, or complaints.    Lloyd Huger, MD   08/12/2022 1:57 PM

## 2022-09-25 ENCOUNTER — Other Ambulatory Visit: Payer: Self-pay | Admitting: General Surgery

## 2022-09-25 DIAGNOSIS — R1084 Generalized abdominal pain: Secondary | ICD-10-CM

## 2022-09-25 DIAGNOSIS — K7460 Unspecified cirrhosis of liver: Secondary | ICD-10-CM

## 2022-10-15 ENCOUNTER — Ambulatory Visit
Admission: RE | Admit: 2022-10-15 | Discharge: 2022-10-15 | Disposition: A | Discharge status: Home / Self Care | Payer: 59 | Source: Ambulatory Visit | Attending: General Surgery | Admitting: General Surgery

## 2022-10-15 DIAGNOSIS — K7460 Unspecified cirrhosis of liver: Secondary | ICD-10-CM

## 2022-10-15 DIAGNOSIS — R1084 Generalized abdominal pain: Secondary | ICD-10-CM

## 2022-10-23 ENCOUNTER — Other Ambulatory Visit: Payer: Self-pay

## 2022-10-23 DIAGNOSIS — F1190 Opioid use, unspecified, uncomplicated: Secondary | ICD-10-CM

## 2022-10-23 DIAGNOSIS — K5903 Drug induced constipation: Secondary | ICD-10-CM

## 2022-10-23 DIAGNOSIS — R6881 Early satiety: Secondary | ICD-10-CM

## 2022-10-23 DIAGNOSIS — R1084 Generalized abdominal pain: Secondary | ICD-10-CM

## 2022-10-28 ENCOUNTER — Other Ambulatory Visit: Payer: Self-pay | Admitting: Internal Medicine

## 2022-10-28 DIAGNOSIS — Z1231 Encounter for screening mammogram for malignant neoplasm of breast: Secondary | ICD-10-CM

## 2022-11-03 ENCOUNTER — Other Ambulatory Visit: Payer: Self-pay | Admitting: Oncology

## 2022-11-06 ENCOUNTER — Ambulatory Visit
Admission: RE | Admit: 2022-11-06 | Discharge: 2022-11-06 | Disposition: A | Discharge status: Home / Self Care | Payer: 59 | Source: Ambulatory Visit | Attending: Internal Medicine | Admitting: Internal Medicine

## 2022-11-06 DIAGNOSIS — Z1231 Encounter for screening mammogram for malignant neoplasm of breast: Secondary | ICD-10-CM | POA: Diagnosis present

## 2022-11-07 ENCOUNTER — Other Ambulatory Visit: Payer: Self-pay | Admitting: *Deleted

## 2022-11-07 MED ORDER — POLYSACCHARIDE IRON COMPLEX 150 MG PO CAPS: 150.0000 mg | ORAL_CAPSULE | Freq: Two times a day (BID) | ORAL | 3 refills | Status: DC

## 2022-11-07 NOTE — Telephone Encounter (Signed)
Patient needs Iron to go to [facility] in [place], not [facility] in [place]. Prescription sent as requested

## 2022-11-11 ENCOUNTER — Ambulatory Visit
Admission: RE | Admit: 2022-11-11 | Discharge: 2022-11-11 | Disposition: A | Discharge status: Home / Self Care | Payer: 59 | Source: Ambulatory Visit | Attending: Internal Medicine | Admitting: Internal Medicine

## 2022-11-11 ENCOUNTER — Other Ambulatory Visit: Payer: Self-pay | Admitting: Internal Medicine

## 2022-11-11 DIAGNOSIS — N63 Unspecified lump in unspecified breast: Secondary | ICD-10-CM

## 2022-11-11 DIAGNOSIS — R928 Other abnormal and inconclusive findings on diagnostic imaging of breast: Secondary | ICD-10-CM

## 2022-11-14 ENCOUNTER — Other Ambulatory Visit: Payer: Self-pay | Admitting: Internal Medicine

## 2022-11-14 ENCOUNTER — Other Ambulatory Visit: Payer: 59

## 2022-11-14 DIAGNOSIS — Z1231 Encounter for screening mammogram for malignant neoplasm of breast: Secondary | ICD-10-CM

## 2022-11-17 ENCOUNTER — Encounter: Payer: Self-pay | Admitting: Oncology

## 2022-11-18 ENCOUNTER — Encounter: Payer: Self-pay | Admitting: Oncology

## 2022-11-19 ENCOUNTER — Encounter
Admission: RE | Admit: 2022-11-19 | Discharge: 2022-11-19 | Disposition: A | Discharge status: Home / Self Care | Payer: 59 | Source: Ambulatory Visit | Attending: Gastroenterology | Admitting: Gastroenterology

## 2022-11-19 DIAGNOSIS — K5903 Drug induced constipation: Secondary | ICD-10-CM | POA: Diagnosis present

## 2022-11-19 DIAGNOSIS — R6881 Early satiety: Secondary | ICD-10-CM | POA: Insufficient documentation

## 2022-11-19 DIAGNOSIS — R1084 Generalized abdominal pain: Secondary | ICD-10-CM | POA: Insufficient documentation

## 2022-11-19 DIAGNOSIS — F1190 Opioid use, unspecified, uncomplicated: Secondary | ICD-10-CM | POA: Insufficient documentation

## 2022-11-19 MED ORDER — TECHNETIUM TC 99M SULFUR COLLOID
2.0000 | Freq: Once | INTRAVENOUS | Status: AC
  Administered 2022-11-19: 1.82 via ORAL

## 2023-02-10 ENCOUNTER — Other Ambulatory Visit: Payer: Self-pay

## 2023-02-10 DIAGNOSIS — D509 Iron deficiency anemia, unspecified: Secondary | ICD-10-CM

## 2023-02-11 ENCOUNTER — Inpatient Hospital Stay: Payer: 59 | Attending: Oncology

## 2023-02-12 ENCOUNTER — Inpatient Hospital Stay: Payer: 59 | Admitting: Oncology

## 2023-02-12 ENCOUNTER — Inpatient Hospital Stay: Payer: 59

## 2023-02-17 ENCOUNTER — Inpatient Hospital Stay: Payer: 59 | Attending: Oncology

## 2023-02-17 DIAGNOSIS — D509 Iron deficiency anemia, unspecified: Secondary | ICD-10-CM | POA: Diagnosis present

## 2023-02-17 LAB — CBC WITH DIFFERENTIAL (CANCER CENTER ONLY)
Abs Immature Granulocytes: 0.02 10*3/uL (ref 0.00–0.07)
Basophils Absolute: 0 10*3/uL (ref 0.0–0.1)
Basophils Relative: 1 %
Eosinophils Absolute: 0.1 10*3/uL (ref 0.0–0.5)
Eosinophils Relative: 1 %
HCT: 40.7 % (ref 36.0–46.0)
Hemoglobin: 12.5 g/dL (ref 12.0–15.0)
Immature Granulocytes: 0 %
Lymphocytes Relative: 24 %
Lymphs Abs: 1.5 10*3/uL (ref 0.7–4.0)
MCH: 25.8 pg — ABNORMAL LOW (ref 26.0–34.0)
MCHC: 30.7 g/dL (ref 30.0–36.0)
MCV: 83.9 fL (ref 80.0–100.0)
Monocytes Absolute: 0.4 10*3/uL (ref 0.1–1.0)
Monocytes Relative: 7 %
Neutro Abs: 4.1 10*3/uL (ref 1.7–7.7)
Neutrophils Relative %: 67 %
Platelet Count: 248 10*3/uL (ref 150–400)
RBC: 4.85 MIL/uL (ref 3.87–5.11)
RDW: 16.4 % — ABNORMAL HIGH (ref 11.5–15.5)
WBC Count: 6.2 10*3/uL (ref 4.0–10.5)
nRBC: 0 % (ref 0.0–0.2)

## 2023-02-17 LAB — IRON AND TIBC
Iron: 60 ug/dL (ref 28–170)
Saturation Ratios: 12 % (ref 10.4–31.8)
TIBC: 511 ug/dL — ABNORMAL HIGH (ref 250–450)
UIBC: 451 ug/dL

## 2023-02-17 LAB — FERRITIN: Ferritin: 11 ng/mL (ref 11–307)

## 2023-02-18 ENCOUNTER — Inpatient Hospital Stay (HOSPITAL_BASED_OUTPATIENT_CLINIC_OR_DEPARTMENT_OTHER): Payer: 59 | Admitting: Oncology

## 2023-02-18 ENCOUNTER — Encounter: Payer: Self-pay | Admitting: Oncology

## 2023-02-18 ENCOUNTER — Inpatient Hospital Stay: Payer: 59

## 2023-02-18 VITALS — BP 141/81 | HR 104 | Temp 97.8°F | Resp 16 | Ht 67.0 in | Wt 210.0 lb

## 2023-02-18 DIAGNOSIS — D509 Iron deficiency anemia, unspecified: Secondary | ICD-10-CM

## 2023-02-18 NOTE — Progress Notes (Signed)
[facility]  Telephone:(336) [phone] Fax:(336) [phone]  ID: Deanna Gay OB: 1956/01/08  MR#: [id number]  UXL#:[id number]  Patient Care Team: Lauro Regulus, MD as PCP - General (Internal Medicine) Jeralyn Ruths, MD as Consulting Physician (Oncology)   CHIEF COMPLAINT: Iron deficiency anemia.  INTERVAL HISTORY: Patient returns to clinic today for repeat laboratory work, further evaluation, and consideration of additional IV Feraheme.  She has occasional lightheadedness that appears to be related to her blood pressure, but otherwise feels well.  She does not complain of weakness or fatigue today.  She has no neurologic complaints.  She denies any recent fevers or illnesses.  She denies any chest pain, shortness of breath, cough, or hemoptysis.  She denies any nausea, vomiting, constipation, or diarrhea. She has no melena or hematochezia.  She has no urinary complaints.  Patient offers no further specific complaints today.  REVIEW OF SYSTEMS:   Review of Systems  Constitutional: Negative.  Negative for fever, malaise/fatigue and weight loss.  Respiratory: Negative.  Negative for cough, hemoptysis and shortness of breath.   Cardiovascular: Negative.  Negative for chest pain and leg swelling.  Gastrointestinal: Negative.  Negative for abdominal pain, blood in stool and melena.  Genitourinary: Negative.  Negative for frequency and hematuria.  Musculoskeletal: Negative.  Negative for myalgias.  Skin: Negative.  Negative for rash.  Neurological: Negative.  Negative for dizziness, focal weakness, weakness and headaches.  Psychiatric/Behavioral: Negative.  The patient is not nervous/anxious.     As per HPI. Otherwise, a complete review of systems is negative.  PAST MEDICAL HISTORY: Past Medical History:  Diagnosis Date   Anemia    Arthritis    Cancer (HCC)    cervical   COPD (chronic obstructive pulmonary disease) (HCC)    COPD exacerbation (HCC)  07/19/2017   Depression    Diabetes mellitus without complication (HCC)    GERD (gastroesophageal reflux disease)    Hypertension    Schizophrenia (HCC)    Sleep apnea     PAST SURGICAL HISTORY: Past Surgical History:  Procedure Laterality Date   APPENDECTOMY     COLONOSCOPY N/A 01/24/2021   Procedure: COLONOSCOPY;  Surgeon: Jaynie Collins, DO;  Location: [facility] ENDOSCOPY;  Service: Gastroenterology;  Laterality: N/A;   COLONOSCOPY WITH PROPOFOL N/A 12/25/2014   Procedure: COLONOSCOPY WITH PROPOFOL;  Surgeon: Elnita Maxwell, MD;  Location: [facility] ENDOSCOPY;  Service: Endoscopy;  Laterality: N/A;   COLONOSCOPY WITH PROPOFOL N/A 01/21/2018   Procedure: COLONOSCOPY WITH PROPOFOL;  Surgeon: Toney Reil, MD;  Location: [facility] ENDOSCOPY;  Service: Gastroenterology;  Laterality: N/A;   ESOPHAGOGASTRODUODENOSCOPY N/A 01/24/2021   Procedure: ESOPHAGOGASTRODUODENOSCOPY (EGD);  Surgeon: Jaynie Collins, DO;  Location: [facility] ENDOSCOPY;  Service: Gastroenterology;  Laterality: N/A;   ESOPHAGOGASTRODUODENOSCOPY (EGD) WITH PROPOFOL N/A 12/25/2014   Procedure: ESOPHAGOGASTRODUODENOSCOPY (EGD) WITH PROPOFOL;  Surgeon: Elnita Maxwell, MD;  Location: [facility] ENDOSCOPY;  Service: Endoscopy;  Laterality: N/A;   ESOPHAGOGASTRODUODENOSCOPY (EGD) WITH PROPOFOL N/A 01/21/2018   Procedure: ESOPHAGOGASTRODUODENOSCOPY (EGD) WITH PROPOFOL;  Surgeon: Toney Reil, MD;  Location: [facility] ENDOSCOPY;  Service: Gastroenterology;  Laterality: N/A;   GIVENS CAPSULE STUDY N/A 03/02/2018   Procedure: GIVENS CAPSULE STUDY;  Surgeon: Toney Reil, MD;  Location: [facility] ENDOSCOPY;  Service: Gastroenterology;  Laterality: N/A;   JOINT REPLACEMENT     right hip   TOTAL HIP ARTHROPLASTY Left 05/16/2016   Procedure: LEFT TOTAL HIP ARTHROPLASTY ANTERIOR APPROACH;  Surgeon: Kathryne Hitch, MD;  Location: [facility] ORS;  Service:  Orthopedics;  Laterality: Left;    FAMILY HISTORY Family History  Problem  Relation Age of Onset   Hypertension Mother    Diabetes Mellitus II Mother    Breast cancer Neg Hx    Kidney cancer Neg Hx    Bladder Cancer Neg Hx        ADVANCED DIRECTIVES:    HEALTH MAINTENANCE: Social History   Tobacco Use   Smoking status: Every Day    Current packs/day: 1.00    Average packs/day: 1 pack/day for 48.0 years (48.0 ttl pk-yrs)    Types: Cigarettes   Smokeless tobacco: Never  Vaping Use   Vaping status: Some Days  Substance Use Topics   Alcohol use: No   Drug use: No     Colonoscopy:  PAP:  Bone density:  Lipid panel:  Allergies  Allergen Reactions   Penicillins Swelling    Has patient had a PCN reaction causing immediate rash, facial/tongue/throat swelling, SOB or lightheadedness with hypotension: Yes Has patient had a PCN reaction causing severe rash involving mucus membranes or skin necrosis: Yes Has patient had a PCN reaction that required hospitalization: No Has patient had a PCN reaction occurring within the last 10 years: No If all of the above answers are "NO", then may proceed with Cephalosporin use.  Has patient had a PCN reaction causing immediate rash, facial/tongue/throat swelling, SOB or lightheadedness with hypotension: Yes Has patient had a PCN reaction causing severe rash involving mucus membranes or skin necrosis: Yes Has patient had a PCN reaction that required hospitalization: No Has patient had a PCN reaction occurring within the last 10 years: No If all of the above answers are "NO", then may proceed with Cephalosporin use. Has patient had a PCN reaction causing immediate rash, faci   Aspirin Other (See Comments)    Ongoing anemia   Iodinated Contrast Media Swelling   Iodine Swelling    Current Outpatient Medications  Medication Sig Dispense Refill   ACCU-CHEK AVIVA PLUS test strip use 1 TEST STRIP to TEST BLOOD SUGAR four times a day  0   albuterol (PROVENTIL HFA;VENTOLIN HFA) 108 (90 BASE) MCG/ACT inhaler Inhale 2  puffs into the lungs every 4 (four) hours as needed for wheezing or shortness of breath.      atorvastatin (LIPITOR) 40 MG tablet Take 40 mg by mouth daily.     BANOPHEN 25 MG capsule SMARTSIG:2 Capsule(s) By Mouth     Blood Glucose Monitoring Suppl (ACCU-CHEK AVIVA PLUS) w/Device KIT See admin instructions.  0   budesonide-formoterol (SYMBICORT) 160-4.5 MCG/ACT inhaler Inhale 2 puffs into the lungs 2 (two) times daily.     Calcium Carb-Cholecalciferol 600-400 MG-UNIT TABS Take 1 tablet by mouth daily.     calcium carbonate (OSCAL) 1500 (600 Ca) MG TABS tablet Take by mouth 2 (two) times daily with a meal.     celecoxib (CELEBREX) 200 MG capsule Take 200 mg by mouth every 12 (twelve) hours.     cyclobenzaprine (FLEXERIL) 10 MG tablet Take 10 mg by mouth at bedtime.     diltiazem (CARDIZEM CD) 180 MG 24 hr capsule Take 180 mg by mouth at bedtime.      diltiazem (TIAZAC) 180 MG 24 hr capsule Take by mouth.     docusate sodium (COLACE) 100 MG capsule Take 100 mg by mouth 2 (two) times daily.     escitalopram (LEXAPRO) 10 MG tablet Take 10 mg by mouth daily.     fexofenadine (ALLEGRA) 180 MG tablet Take  180 mg by mouth daily.     fluticasone (FLONASE) 50 MCG/ACT nasal spray Place 2 sprays into both nostrils daily.     furosemide (LASIX) 20 MG tablet Take 20 mg by mouth daily.     hydrochlorothiazide (HYDRODIURIL) 25 MG tablet take 1 tablet by mouth every morning     insulin glargine (LANTUS) 100 UNIT/ML injection Inject 0.1 mLs (10 Units total) into the skin at bedtime. 10 mL 11   insulin lispro (HUMALOG) 100 UNIT/ML injection Please check your sugars before each meal and use lispro per the below sliding scale:  For sugars 151-200: take 2 units For 201-250: take 4units For 251-300: take 6 units For 301-350: take 8 units For 351-400: take 10 units For > 401: call MD 10 mL 11   Insulin Syringe-Needle U-100 31G X 5/16" 0.5 ML MISC use as directed once daily     ipratropium-albuterol (DUONEB)  0.5-2.5 (3) MG/3ML SOLN Take 3 mLs by nebulization every 6 (six) hours as needed.     iron polysaccharides (NIFEREX) 150 MG capsule Take 1 capsule (150 mg total) by mouth 2 (two) times daily. 90 capsule 3   JARDIANCE 10 MG TABS tablet Take 10 mg by mouth daily.     losartan (COZAAR) 50 MG tablet Take 50 mg by mouth daily.     Magnesium 250 MG TABS Take by mouth daily.     meloxicam (MOBIC) 15 MG tablet Take 15 mg by mouth daily.     metoprolol succinate (TOPROL-XL) 25 MG 24 hr tablet Take 25 mg by mouth daily.     montelukast (SINGULAIR) 10 MG tablet Take 10 mg by mouth at bedtime.     MOUNJARO 5 MG/0.5ML Pen Inject 5 mg into the skin once a week.     Multiple Vitamins-Minerals (ZINC PO) Take by mouth.     mupirocin ointment (BACTROBAN) 2 % Apply 1 Application topically 2 (two) times daily. 22 g 0   Naloxone HCl (EVZIO) 0.4 MG/0.4ML SOAJ Inject as directed.     nitroGLYCERIN (NITROSTAT) 0.4 MG SL tablet Place 1 tablet under the tongue every 5 (five) minutes as needed.  0   nitroGLYCERIN (NITROSTAT) 0.4 MG SL tablet Place under the tongue.     omeprazole (PRILOSEC) 40 MG capsule Take 40 mg by mouth 2 (two) times daily.     Oxycodone HCl 10 MG TABS Take 10 mg by mouth every 6 (six) hours.     oxyCODONE-acetaminophen (PERCOCET) 7.5-325 MG tablet Take 1-2 tablets by mouth every 6 (six) hours as needed for severe pain. 30 tablet 0   potassium chloride (KLOR-CON) 10 MEQ tablet Take 10 mEq by mouth daily.     pregabalin (LYRICA) 25 MG capsule Take 25 mg by mouth 2 (two) times daily.     roflumilast (DALIRESP) 500 MCG TABS tablet Take 500 mcg by mouth daily.     tiotropium (SPIRIVA) 18 MCG inhalation capsule Place 18 mcg into inhaler and inhale daily.     triamcinolone (KENALOG) 0.1 % paste Use as directed 1 application in the mouth or throat 2 (two) times daily.     vitamin B-12 (CYANOCOBALAMIN) 500 MCG tablet Take 500 mcg by mouth daily.     predniSONE (DELTASONE) 10 MG tablet Label  & dispense  according to the schedule below. 5 Pills PO for 1 day then, 4 Pills PO for 1 day, 3 Pills PO for 1 day, 2 Pills PO for 1 day, 1 Pill PO for 1 days then STOP. (  Patient not taking: Reported on 08/12/2022) 15 tablet 0   TRULICITY 0.75 MG/0.5ML SOPN Inject 0.75 mg into the skin once a week. (Patient not taking: Reported on 08/12/2022)     No current facility-administered medications for this visit.    OBJECTIVE: Vitals:   02/18/23 0946  BP: (!) 141/81  Pulse: (!) 104  Resp: 16  Temp: 97.8 F (36.6 C)  SpO2: 96%     Body mass index is 32.89 kg/m.    ECOG FS:0 - Asymptomatic  General: Well-developed, well-nourished, no acute distress. Eyes: Pink conjunctiva, anicteric sclera. HEENT: Normocephalic, moist mucous membranes. Lungs: No audible wheezing or coughing. Heart: Regular rate and rhythm. Abdomen: Soft, nontender, no obvious distention. Musculoskeletal: No edema, cyanosis, or clubbing. Neuro: Alert, answering all questions appropriately. Cranial nerves grossly intact. Skin: No rashes or petechiae noted. Psych: Normal affect.  LAB RESULTS:  Lab Results  Component Value Date   NA 135 05/08/2022   K 3.4 (L) 05/08/2022   CL 96 (L) 05/08/2022   CO2 29 05/08/2022   GLUCOSE 287 (H) 05/08/2022   BUN 24 (H) 05/08/2022   CREATININE 1.35 (H) 05/08/2022   CALCIUM 8.3 (L) 05/08/2022   PROT 7.4 05/08/2022   ALBUMIN 3.4 (L) 05/08/2022   AST 14 (L) 05/08/2022   ALT 14 05/08/2022   ALKPHOS 107 05/08/2022   BILITOT 0.5 05/08/2022   GFRNONAA 43 (L) 05/08/2022   GFRAA 49 (L) 10/31/2019    Lab Results  Component Value Date   WBC 6.2 02/17/2023   NEUTROABS 4.1 02/17/2023   HGB 12.5 02/17/2023   HCT 40.7 02/17/2023   MCV 83.9 02/17/2023   PLT 248 02/17/2023   Lab Results  Component Value Date   FERRITIN 11 02/17/2023   Lab Results  Component Value Date   IRON 60 02/17/2023   TIBC 511 (H) 02/17/2023   IRONPCTSAT 12 02/17/2023      STUDIES: No results found.  ASSESSMENT:  Iron deficiency anemia.  PLAN:    Iron deficiency anemia: Patient's hemoglobin and iron stores continue to be within normal limits.  Her most recent colonoscopy and endoscopy on January 24, 2021 revealed multiple polyps, but no other significant pathology.  She does not require additional treatment today.  Patient last received IV Feraheme on May 21, 2022.  Return to clinic in 6 months for repeat laboratory work, further evaluation, and consideration of additional treatment.  If patient's iron stores continue to be within normal limits she possibly could be discharged from clinic. Lightheadedness: Appears to be positional.  Patient plans to further discuss with her primary care physician.  I spent a total of 20 minutes reviewing chart data, face-to-face evaluation with the patient, counseling and coordination of care as detailed above.   Patient expressed understanding and was in agreement with this plan. She also understands that She can call clinic at any time with any questions, concerns, or complaints.    Jeralyn Ruths, MD   02/18/2023 10:52 AM

## 2023-02-26 ENCOUNTER — Other Ambulatory Visit: Payer: Self-pay | Admitting: Internal Medicine

## 2023-02-26 DIAGNOSIS — R0602 Shortness of breath: Secondary | ICD-10-CM

## 2023-02-26 DIAGNOSIS — R079 Chest pain, unspecified: Secondary | ICD-10-CM

## 2023-02-27 ENCOUNTER — Telehealth (HOSPITAL_COMMUNITY): Payer: Self-pay | Admitting: Emergency Medicine

## 2023-02-27 ENCOUNTER — Other Ambulatory Visit (HOSPITAL_COMMUNITY): Payer: Self-pay | Admitting: Emergency Medicine

## 2023-02-27 DIAGNOSIS — R079 Chest pain, unspecified: Secondary | ICD-10-CM

## 2023-02-27 MED ORDER — METOPROLOL TARTRATE 100 MG PO TABS: 100.0000 mg | ORAL_TABLET | Freq: Once | ORAL | 0 refills | Status: DC

## 2023-02-27 MED ORDER — IVABRADINE HCL 5 MG PO TABS
15.0000 mg | ORAL_TABLET | Freq: Once | ORAL | 0 refills | Status: AC
Start: 2023-02-27 — End: 2023-02-27

## 2023-02-27 NOTE — Telephone Encounter (Signed)
Reaching out to patient to offer assistance regarding upcoming cardiac imaging study; pt verbalizes understanding of appt date/time, parking situation and where to check in, pre-test NPO status and medications ordered, and verified current allergies; name and call back number provided for further questions should they arise Rockwell Alexandria RN Navigator Cardiac Imaging [facility] [phone] office [phone] cell  Pt requested instructions be sent to email  [email or web address]

## 2023-03-02 ENCOUNTER — Ambulatory Visit
Admission: RE | Admit: 2023-03-02 | Discharge: 2023-03-02 | Disposition: A | Discharge status: Home / Self Care | Payer: 59 | Source: Ambulatory Visit | Attending: Internal Medicine | Admitting: Internal Medicine

## 2023-03-02 DIAGNOSIS — R0602 Shortness of breath: Secondary | ICD-10-CM

## 2023-03-02 DIAGNOSIS — R079 Chest pain, unspecified: Secondary | ICD-10-CM

## 2023-03-02 MED ORDER — DILTIAZEM HCL 25 MG/5ML IV SOLN
5.0000 mg | Freq: Once | INTRAVENOUS | Status: AC
  Administered 2023-03-02: 5 mg via INTRAVENOUS

## 2023-03-02 MED ORDER — IOHEXOL 350 MG/ML SOLN: 80.0000 mL | Freq: Once | INTRAVENOUS | Status: DC | PRN

## 2023-03-02 MED ORDER — METOPROLOL TARTRATE 5 MG/5ML IV SOLN
INTRAVENOUS | Status: AC
  Filled 2023-03-02: qty 10

## 2023-03-02 MED ORDER — DILTIAZEM HCL 25 MG/5ML IV SOLN
INTRAVENOUS | Status: AC
  Filled 2023-03-02: qty 5

## 2023-03-02 MED ORDER — METOPROLOL TARTRATE 5 MG/5ML IV SOLN
10.0000 mg | Freq: Once | INTRAVENOUS | Status: AC
  Administered 2023-03-02: 10 mg via INTRAVENOUS

## 2023-03-02 NOTE — Progress Notes (Signed)
Pt HR elevated, not able to complete scan. Dr. Myriam Forehand is aware. Pt agreeable with alternate scan.

## 2023-03-12 ENCOUNTER — Telehealth (HOSPITAL_COMMUNITY): Payer: Self-pay | Admitting: *Deleted

## 2023-03-12 ENCOUNTER — Other Ambulatory Visit (HOSPITAL_COMMUNITY): Payer: Self-pay | Admitting: *Deleted

## 2023-03-12 MED ORDER — DILTIAZEM HCL 60 MG PO TABS: ORAL_TABLET | ORAL | 0 refills | Status: DC

## 2023-03-12 MED ORDER — PREDNISONE 50 MG PO TABS: ORAL_TABLET | ORAL | 0 refills | Status: DC

## 2023-03-12 NOTE — Telephone Encounter (Signed)
Patient calling about her upcoming cardiac imaging study; pt verbalizes understanding of appt date/time, parking situation and where to check in, pre-test NPO status and medications ordered, and verified current allergies; name and call back number provided for further questions should they arise  Larey Brick RN Navigator Cardiac Imaging [facility] [phone] office [phone] cell  Patient states she cannot afford the ivabradine.  Discussed with Dr. Azucena Cecil, patient to take 13 hour prep, 100mg  metoprolol tartrate and 60mg  cardizem for her cardiac CT. Updated instructions emailed to patient.

## 2023-03-16 ENCOUNTER — Ambulatory Visit: Admission: RE | Admit: 2023-03-16 | Payer: 59 | Source: Ambulatory Visit

## 2023-03-19 ENCOUNTER — Ambulatory Visit (INDEPENDENT_AMBULATORY_CARE_PROVIDER_SITE_OTHER): Payer: 59 | Admitting: Dermatology

## 2023-03-19 ENCOUNTER — Encounter: Payer: Self-pay | Admitting: Dermatology

## 2023-03-19 DIAGNOSIS — W57XXXA Bitten or stung by nonvenomous insect and other nonvenomous arthropods, initial encounter: Secondary | ICD-10-CM

## 2023-03-19 DIAGNOSIS — L03115 Cellulitis of right lower limb: Secondary | ICD-10-CM

## 2023-03-19 DIAGNOSIS — Z7189 Other specified counseling: Secondary | ICD-10-CM

## 2023-03-19 DIAGNOSIS — Z79899 Other long term (current) drug therapy: Secondary | ICD-10-CM

## 2023-03-19 DIAGNOSIS — R21 Rash and other nonspecific skin eruption: Secondary | ICD-10-CM

## 2023-03-19 DIAGNOSIS — S80861A Insect bite (nonvenomous), right lower leg, initial encounter: Secondary | ICD-10-CM

## 2023-03-19 MED ORDER — DOXYCYCLINE MONOHYDRATE 100 MG PO CAPS: 100.0000 mg | ORAL_CAPSULE | Freq: Two times a day (BID) | ORAL | 0 refills | Status: AC

## 2023-03-19 MED ORDER — MUPIROCIN 2 % EX OINT: 1.0000 | TOPICAL_OINTMENT | Freq: Two times a day (BID) | CUTANEOUS | 0 refills | Status: DC

## 2023-03-19 NOTE — Patient Instructions (Signed)
Start Doxycycline 100 mg twice daily with food   Start Mupirocin ointment twice daily to affected area    Doxycycline should be taken with food to prevent nausea. Do not lay down for 30 minutes after taking. Be cautious with sun exposure and use good sun protection while on this medication. Pregnant women should not take this medication.       Due to recent changes in healthcare laws, you may see results of your pathology and/or laboratory studies on MyChart before the doctors have had a chance to review them. We understand that in some cases there may be results that are confusing or concerning to you. Please understand that not all results are received at the same time and often the doctors may need to interpret multiple results in order to provide you with the best plan of care or course of treatment. Therefore, we ask that you please give [place] 2 business days to thoroughly review all your results before contacting the office for clarification. Should we see a critical lab result, you will be contacted sooner.   If You Need Anything After Your Visit  If you have any questions or concerns for your doctor, please call our main line at [phone] and press option 4 to reach your doctor's medical assistant. If no one answers, please leave a voicemail as directed and we will return your call as soon as possible. Messages left after 4 pm will be answered the following business day.   You may also send [place] a message via MyChart. We typically respond to MyChart messages within 1-2 business days.  For prescription refills, please ask your pharmacy to contact our office. Our fax number is [phone].  If you have an urgent issue when the clinic is closed that cannot wait until the next business day, you can page your doctor at the number below.    Please note that while we do our best to be available for urgent issues outside of office hours, we are not available 24/7.   If you have an urgent  issue and are unable to reach [place], you may choose to seek medical care at your doctor's office, retail clinic, urgent care center, or emergency room.  If you have a medical emergency, please immediately call 911 or go to the emergency department.  Pager Numbers  - Dr. Gwen Pounds: [phone]  - Dr. Roseanne Reno: [phone]  - Dr. Katrinka Blazing: [phone]   In the event of inclement weather, please call our main line at [phone] for an update on the status of any delays or closures.  Dermatology Medication Tips: Please keep the boxes that topical medications come in in order to help keep track of the instructions about where and how to use these. Pharmacies typically print the medication instructions only on the boxes and not directly on the medication tubes.   If your medication is too expensive, please contact our office at [phone] option 4 or send [place] a message through MyChart.   We are unable to tell what your co-pay for medications will be in advance as this is different depending on your insurance coverage. However, we may be able to find a substitute medication at lower cost or fill out paperwork to get insurance to cover a needed medication.   If a prior authorization is required to get your medication covered by your insurance company, please allow [place] 1-2 business days to complete this process.  Drug prices often vary depending on where the prescription is filled and  some pharmacies may offer cheaper prices.  The website www.goodrx.com contains coupons for medications through different pharmacies. The prices here do not account for what the cost may be with help from insurance (it may be cheaper with your insurance), but the website can give you the price if you did not use any insurance.  - You can print the associated coupon and take it with your prescription to the pharmacy.  - You may also stop by our office during regular business hours and pick up a GoodRx coupon card.  - If  you need your prescription sent electronically to a different pharmacy, notify our office through [facility] or by phone at [phone] option 4.     Si Usted Necesita Algo Despus de Su Visita  Tambin puede enviarnos un mensaje a travs de Clinical cytogeneticist. Por lo general respondemos a los mensajes de MyChart en el transcurso de 1 a 2 das hbiles.  Para renovar recetas, por favor pida a su farmacia que se ponga en contacto con nuestra oficina. Annie Sable de fax es [place] [phone].  Si tiene un asunto urgente cuando la clnica est cerrada y que no puede esperar hasta el siguiente da hbil, puede llamar/localizar a su doctor(a) al nmero que aparece a continuacin.   Por favor, tenga en cuenta que aunque hacemos todo lo posible para estar disponibles para asuntos urgentes fuera del horario de [place], no estamos disponibles las 24 horas del da, los 7 [address]  [address] de la [place].   Si tiene un problema urgente y no puede comunicarse con nosotros, puede optar por buscar atencin mdica  en el consultorio de su doctor(a), en una clnica privada, en un centro de atencin urgente o en una sala de emergencias.  Si tiene Engineer, drilling, por favor llame inmediatamente al 911 o vaya a la sala de emergencias.  Nmeros de bper  - Dr. Gwen Pounds: [phone]  - Dra. Roseanne Reno: [phone]  - Dr. Katrinka Blazing: [phone]   En caso de inclemencias del tiempo, por favor llame a Lacy Duverney principal al [phone] para una actualizacin sobre el [place] de cualquier retraso o cierre.  Consejos para la medicacin en dermatologa: Por favor, guarde las cajas en las que vienen los medicamentos de uso tpico para ayudarle a seguir las instrucciones sobre dnde y cmo usarlos. Las farmacias generalmente imprimen las instrucciones del medicamento slo en las cajas y no directamente en los tubos del [place].   Si su medicamento es muy caro, por favor, pngase en contacto con Rolm Gala llamando  al [phone] y presione la opcin 4 o envenos un mensaje a travs de Clinical cytogeneticist.   No podemos decirle cul ser su copago por los medicamentos por adelantado ya que esto es diferente dependiendo de la cobertura de su seguro. Sin embargo, es posible que podamos encontrar un medicamento sustituto a Audiological scientist un formulario para que el seguro cubra el medicamento que se considera necesario.   Si se requiere una autorizacin previa para que su compaa de seguros [place] su medicamento, por favor permtanos de 1 a 2 das hbiles para completar [address]  Los precios de los medicamentos varan con frecuencia dependiendo del Environmental consultant de dnde se surte la receta y alguna farmacias pueden ofrecer precios ms baratos.  El sitio web www.goodrx.com tiene cupones para medicamentos de Health and safety inspector. Los precios aqu no tienen en cuenta lo que podra costar con la ayuda del seguro (puede ser ms barato con su seguro), pero el sitio web puede darle el  precio si no Visual merchandiser.  - Puede imprimir el cupn correspondiente y llevarlo con su receta a la farmacia.  - Tambin puede pasar por nuestra oficina durante el horario de atencin regular y Education officer, museum una tarjeta de cupones de GoodRx.  - Si necesita que su receta se enve electrnicamente a una farmacia diferente, informe a nuestra oficina a travs de MyChart de [place] o por telfono llamando al [phone] y presione la opcin 4.

## 2023-03-19 NOTE — Progress Notes (Signed)
   Follow Up Visit   Subjective  KYNSLEI ART is a 67 y.o. female who presents for the following: redness, swelling, painful bump at right medial leg below knee. Dur: few days.  The following portions of the chart were reviewed this encounter and updated as appropriate: medications, allergies, medical history  Review of Systems:  No other skin or systemic complaints except as noted in HPI or Assessment and Plan.  Objective  Well appearing patient in no apparent distress; mood and affect are within normal limits.  A focused examination was performed of the following areas: Right leg  Relevant exam findings are noted in the Assessment and Plan.       Assessment & Plan   Rash Related Procedures Anaerobic and Aerobic Culture  Bug bite with infection, initial encounter Related Procedures Anaerobic and Aerobic Culture  Bite reaction with cellulitis Exam: erythematous papule with surrounding erythema  Treatment Plan: Start Doxycycline 100 mg twice daily for 10 days. Take with food.   Apply Mupirocin ointment twice daily.   Doxycycline should be taken with food to prevent nausea. Do not lay down for 30 minutes after taking. Be cautious with sun exposure and use good sun protection while on this medication. Pregnant women should not take this medication.   Return if symptoms worsen or fail to improve.  I, Lawson Radar, CMA, am acting as scribe for Armida Sans, MD.   Documentation: I have reviewed the above documentation for accuracy and completeness, and I agree with the above.  Armida Sans, MD

## 2023-03-24 ENCOUNTER — Telehealth: Payer: Self-pay

## 2023-03-24 ENCOUNTER — Ambulatory Visit (INDEPENDENT_AMBULATORY_CARE_PROVIDER_SITE_OTHER): Payer: 59 | Admitting: Dermatology

## 2023-03-24 ENCOUNTER — Encounter: Payer: Self-pay | Admitting: Dermatology

## 2023-03-24 DIAGNOSIS — L97911 Non-pressure chronic ulcer of unspecified part of right lower leg limited to breakdown of skin: Secondary | ICD-10-CM | POA: Diagnosis not present

## 2023-03-24 DIAGNOSIS — L03115 Cellulitis of right lower limb: Secondary | ICD-10-CM | POA: Diagnosis not present

## 2023-03-24 DIAGNOSIS — Z79899 Other long term (current) drug therapy: Secondary | ICD-10-CM

## 2023-03-24 DIAGNOSIS — Z7189 Other specified counseling: Secondary | ICD-10-CM | POA: Diagnosis not present

## 2023-03-24 NOTE — Telephone Encounter (Signed)
Patient called this morning stating she now has a large sore on her leg and the pain is radiating down. She wants to know what else can she do besides taking the antibiotic?

## 2023-03-24 NOTE — Progress Notes (Unsigned)
   Follow-Up Visit   Subjective  Deanna Gay is a 67 y.o. female who presents for the following: worsening at right medial leg below knee. Patient was seen last week and was put on doxycycline 100 mg to take twice daily for 10 days. Patient has 9 pills left and is using mupirocin ointment. Patient said she squeezed it on Saturday and it started getting worse yesterday. Culture was done but we haven't received results yet.   The patient has spots, moles and lesions to be evaluated, some may be new or changing and the patient may have concern these could be cancer.   The following portions of the chart were reviewed this encounter and updated as appropriate: medications, allergies, medical history  Review of Systems:  No other skin or systemic complaints except as noted in HPI or Assessment and Plan.  Objective  Well appearing patient in no apparent distress; mood and affect are within normal limits.   A focused examination was performed of the following areas: Right leg  Relevant exam findings are noted in the Assessment and Plan.    Assessment & Plan   Bite reaction with cellulitis Exam: erythematous papule with surrounding erythema  Treatment Plan: Continue Doxycycline 100 mg twice daily for a total of 14 days. Take with food.   Wound debrided today and culture done.   Apply Mupirocin ointment twice daily.    Doxycycline should be taken with food to prevent nausea. Do not lay down for 30 minutes after taking. Be cautious with sun exposure and use good sun protection while on this medication. Pregnant women should not take this medication.   Cellulitis of right lower extremity  Related Procedures Anaerobic and Aerobic Culture    Return if symptoms worsen or fail to improve.  Anise Salvo, RMA, am acting as scribe for Armida Sans, MD .   Documentation: I have reviewed the above documentation for accuracy and completeness, and I agree with the  above.  Armida Sans, MD

## 2023-03-24 NOTE — Telephone Encounter (Signed)
Patient scheduled. aw 

## 2023-03-24 NOTE — Patient Instructions (Signed)

## 2023-03-25 LAB — ANAEROBIC AND AEROBIC CULTURE

## 2023-03-26 ENCOUNTER — Telehealth: Payer: Self-pay

## 2023-03-26 ENCOUNTER — Encounter: Payer: Self-pay | Admitting: Emergency Medicine

## 2023-03-26 ENCOUNTER — Emergency Department
Admission: EM | Admit: 2023-03-26 | Discharge: 2023-03-26 | Discharge status: Against Medical Advice | Payer: 59 | Attending: Emergency Medicine | Admitting: Emergency Medicine

## 2023-03-26 ENCOUNTER — Telehealth: Payer: Self-pay | Admitting: Emergency Medicine

## 2023-03-26 ENCOUNTER — Emergency Department: Payer: 59

## 2023-03-26 ENCOUNTER — Other Ambulatory Visit: Payer: Self-pay

## 2023-03-26 DIAGNOSIS — L089 Local infection of the skin and subcutaneous tissue, unspecified: Secondary | ICD-10-CM | POA: Diagnosis present

## 2023-03-26 DIAGNOSIS — Z5321 Procedure and treatment not carried out due to patient leaving prior to being seen by health care provider: Secondary | ICD-10-CM | POA: Insufficient documentation

## 2023-03-26 LAB — COMPREHENSIVE METABOLIC PANEL
ALT: 9 U/L (ref 0–44)
AST: 14 U/L — ABNORMAL LOW (ref 15–41)
Albumin: 3.1 g/dL — ABNORMAL LOW (ref 3.5–5.0)
Alkaline Phosphatase: 97 U/L (ref 38–126)
Anion gap: 12 (ref 5–15)
BUN: 14 mg/dL (ref 8–23)
CO2: 27 mmol/L (ref 22–32)
Calcium: 8.4 mg/dL — ABNORMAL LOW (ref 8.9–10.3)
Chloride: 94 mmol/L — ABNORMAL LOW (ref 98–111)
Creatinine, Ser: 1.25 mg/dL — ABNORMAL HIGH (ref 0.44–1.00)
GFR, Estimated: 47 mL/min — ABNORMAL LOW (ref >60)
Glucose, Bld: 153 mg/dL — ABNORMAL HIGH (ref 70–99)
Potassium: 3.3 mmol/L — ABNORMAL LOW (ref 3.5–5.1)
Sodium: 133 mmol/L — ABNORMAL LOW (ref 135–145)
Total Bilirubin: 0.6 mg/dL (ref <1.2)
Total Protein: 7.9 g/dL (ref 6.5–8.1)

## 2023-03-26 LAB — CBC WITH DIFFERENTIAL/PLATELET
Abs Immature Granulocytes: 0.03 10*3/uL (ref 0.00–0.07)
Basophils Absolute: 0.1 10*3/uL (ref 0.0–0.1)
Basophils Relative: 1 %
Eosinophils Absolute: 0.1 10*3/uL (ref 0.0–0.5)
Eosinophils Relative: 1 %
HCT: 37 % (ref 36.0–46.0)
Hemoglobin: 11.6 g/dL — ABNORMAL LOW (ref 12.0–15.0)
Immature Granulocytes: 0 %
Lymphocytes Relative: 25 %
Lymphs Abs: 2.1 10*3/uL (ref 0.7–4.0)
MCH: 25.3 pg — ABNORMAL LOW (ref 26.0–34.0)
MCHC: 31.4 g/dL (ref 30.0–36.0)
MCV: 80.6 fL (ref 80.0–100.0)
Monocytes Absolute: 0.7 10*3/uL (ref 0.1–1.0)
Monocytes Relative: 8 %
Neutro Abs: 5.4 10*3/uL (ref 1.7–7.7)
Neutrophils Relative %: 65 %
Platelets: 334 10*3/uL (ref 150–400)
RBC: 4.59 MIL/uL (ref 3.87–5.11)
RDW: 16.8 % — ABNORMAL HIGH (ref 11.5–15.5)
WBC: 8.3 10*3/uL (ref 4.0–10.5)
nRBC: 0 % (ref 0.0–0.2)

## 2023-03-26 LAB — LACTIC ACID, PLASMA: Lactic Acid, Venous: 1.6 mmol/L (ref 0.5–1.9)

## 2023-03-26 NOTE — Telephone Encounter (Signed)
-----   Message from Armida Sans sent at 03/25/2023  1:47 PM EST ----- + heavy growth Staph on culture from leg from 03/19/2023 Pt seen 03/24/2023 and wound had drained. Currently on doxycycline for past week. Re-cultured on 03/24/2023  Please contact lab and ask if they are doing sensitivities to Tetracycline / Doxycycline. Usually Staph is susceptible to Doxycycline but not tested/

## 2023-03-26 NOTE — Telephone Encounter (Signed)
Called the patient, noted tachycardia and concern for an infection, instructed to return to the emergency department to complete a workup or to go to another emergency department.

## 2023-03-26 NOTE — Telephone Encounter (Signed)
Per verbal order of Dr. Gwen Pounds patient to start Cephalexin 500 mg 1 po BID x 10 days.I Discussed culture results with patient and advised her to stop the Doxycyline and start Cephalexin. 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 Gwen Pounds patient can not take Cephalexin due to allergy. I called patient back and discussed this with her, and that because the bacteria is resistant to all other oral medications that we would prescribe the only other options are IV antibiotics, and she would need to be seen at the hospital for treatment. When speaking to the patient she stated that she is also having SOB and fatigue. I discussed patient's symptoms with Dr. Gwen Pounds and he recommend patient going to ED due to SOB. I advised patient that she should go to the ED due to SOB, and I contact [facility] ED to inform them that she was coming.

## 2023-03-26 NOTE — Telephone Encounter (Signed)
See previous telephone call note. I advised patient to continue wound care, continue Doxycycline, and Mupirocin 2% ointment to wound.

## 2023-03-26 NOTE — ED Triage Notes (Signed)
Patient wheeled to triage was sent by [facility] tonight due to staph infection of right leg. Was prescribed antibiotics on Tuesday and told they would not cover her infection to come to the ER so she did not worsen.

## 2023-03-29 LAB — ANAEROBIC AND AEROBIC CULTURE

## 2023-03-30 ENCOUNTER — Telehealth: Payer: Self-pay

## 2023-03-30 NOTE — Telephone Encounter (Signed)
Patient states that leg is doing better, but not healed. I advised patient that the lower legs do take longer to heal than most of the body. Pt states she is having some drainage, but not pus, and c/o occasional shooting pains at site. She wanted to know we were going to send in the oral antibiotic for her. Her pharmacy is [facility].

## 2023-03-30 NOTE — Telephone Encounter (Signed)
-----   Message from Armida Sans sent at 03/29/2023  5:05 PM EST ----- Wound culture from leg draining wound (previous bug bite; worsened, then drained and improved) Pt on oral doxycycline. 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  Resistant to Tetracycline.  Sensitive to Vancomycin. Pt was called by [facility] MA about culture on 03/26/2023 and while on phone she told MA that she was having Shortness of breath (she has pulmonary issues) and fatigue and wondered if she needed to go to ER.  She was advised that due to her symptoms, she should best go to ER.  Called pt today 03/29/2023 and she said she went to ER and had blood drawn, but was never seen by a doctor so she left the ER.  I advised her of her previous culture and   2nd Culture of leg wound 03/24/2023 showing Moderate growth Staph aureus sensitive to Oxacillin; resistant to Tetracycline. She said that her leg wound is looking fine right now and healing.  She was advised to continue using the topical Mupirocin and oral doxycycline.   When I reviewed her history of allergy to Cephalosporins, she said that she does not know if she is allergic to it and it was something that occurred when she was 67 years old.  She said she would be willing to try taking an oral Cephalosporin. I advised her that we would contact her tomorrow 03/30/2023 and determine what to do. She is advised if she continues topical Mupirocin, since she is doing well, she may not need any additional oral med since the area drained and is now healing and improved.  Please contact pt and ask how leg wound doing.  We can try to see her this week to decide whether she needs any further systemic antibiotics.

## 2023-03-31 NOTE — Telephone Encounter (Signed)
Left message on voicemail to return my call.  

## 2023-04-01 NOTE — Telephone Encounter (Signed)
I spoke with patient and we scheduled an appointment for 04/07/23 at 11:45 am to recheck wound. Pt states that wound is healing great as of today.

## 2023-04-07 ENCOUNTER — Ambulatory Visit: Payer: 59 | Admitting: Dermatology

## 2023-04-07 DIAGNOSIS — Z79899 Other long term (current) drug therapy: Secondary | ICD-10-CM

## 2023-04-07 DIAGNOSIS — W57XXXS Bitten or stung by nonvenomous insect and other nonvenomous arthropods, sequela: Secondary | ICD-10-CM | POA: Diagnosis not present

## 2023-04-07 DIAGNOSIS — L97911 Non-pressure chronic ulcer of unspecified part of right lower leg limited to breakdown of skin: Secondary | ICD-10-CM

## 2023-04-07 DIAGNOSIS — S80861S Insect bite (nonvenomous), right lower leg, sequela: Secondary | ICD-10-CM

## 2023-04-07 DIAGNOSIS — L03115 Cellulitis of right lower limb: Secondary | ICD-10-CM

## 2023-04-07 DIAGNOSIS — Z7189 Other specified counseling: Secondary | ICD-10-CM

## 2023-04-07 DIAGNOSIS — S81801D Unspecified open wound, right lower leg, subsequent encounter: Secondary | ICD-10-CM | POA: Diagnosis not present

## 2023-04-07 MED ORDER — MUPIROCIN 2 % EX OINT: 1.0000 | TOPICAL_OINTMENT | Freq: Two times a day (BID) | CUTANEOUS | 11 refills | Status: AC

## 2023-04-07 NOTE — Patient Instructions (Signed)

## 2023-04-07 NOTE — Progress Notes (Signed)
   Follow-Up Visit   Subjective  Deanna Gay is a 67 y.o. female who presents for the following: Recheck healing wound on the R leg, patient finished Doxycycline 100 mg po BID on Sunday 04/05/23. Pt states that wound has improved.   The following portions of the chart were reviewed this encounter and updated as appropriate: medications, allergies, medical history  Review of Systems:  No other skin or systemic complaints except as noted in HPI or Assessment and Plan.  Objective  Well appearing patient in no apparent distress; mood and affect are within normal limits.   A focused examination was performed of the following areas: There legs   Relevant exam findings are noted in the Assessment and Plan.  R lower leg       Assessment & Plan   Wound of R leg after insect bite followed by Cellulitis of right lower extremity Cellulitis now resolved after oral antibiotics and topical Mupirocin Pt says improved and healing and no longer swollen, draining or painful. R lower leg  Likely from spider bite, improving compared to previous photo and last visit.   Wound debrided today with Puracyn spray, Mupirocin 2% ointment applied, and wound covered with bandage.   Continue Mupirocin 2% ointment QD until completely healed. No need for oral antibiotics at this time.  Return if worsening or failure to continue to improve.  Because pt has documentation of allergy to Cephalosporins -- which is the only oral that her Staph cultured bacteria is sensitive to, do not want to send Rx. She is clearing now, so not necessary to risk or challenge her to Cephalosporin. Pt says she is not sure if she is allergic as it is something that happened when she was 67 yrs old.   Return if symptoms worsen or fail to improve.  [name], CMA, am acting as scribe for Armida Sans, MD .  Documentation: I have reviewed the above documentation for accuracy and completeness, and I agree with the  above.  Armida Sans, MD

## 2023-04-12 ENCOUNTER — Encounter: Payer: Self-pay | Admitting: Dermatology

## 2023-04-28 ENCOUNTER — Encounter (HOSPITAL_COMMUNITY): Payer: Self-pay

## 2023-05-04 ENCOUNTER — Other Ambulatory Visit: Payer: Self-pay | Admitting: Acute Care

## 2023-05-04 DIAGNOSIS — Z87891 Personal history of nicotine dependence: Secondary | ICD-10-CM

## 2023-05-04 DIAGNOSIS — F17210 Nicotine dependence, cigarettes, uncomplicated: Secondary | ICD-10-CM

## 2023-05-04 DIAGNOSIS — Z122 Encounter for screening for malignant neoplasm of respiratory organs: Secondary | ICD-10-CM

## 2023-05-19 ENCOUNTER — Ambulatory Visit: Admission: RE | Admit: 2023-05-19 | Payer: 59 | Source: Ambulatory Visit

## 2023-05-28 ENCOUNTER — Ambulatory Visit
Admission: RE | Admit: 2023-05-28 | Discharge: 2023-05-28 | Disposition: A | Discharge status: Home / Self Care | Payer: 59 | Source: Ambulatory Visit | Attending: Acute Care | Admitting: Acute Care

## 2023-05-28 DIAGNOSIS — F17210 Nicotine dependence, cigarettes, uncomplicated: Secondary | ICD-10-CM | POA: Diagnosis present

## 2023-05-28 DIAGNOSIS — Z122 Encounter for screening for malignant neoplasm of respiratory organs: Secondary | ICD-10-CM | POA: Diagnosis present

## 2023-05-28 DIAGNOSIS — Z87891 Personal history of nicotine dependence: Secondary | ICD-10-CM | POA: Insufficient documentation

## 2023-06-04 ENCOUNTER — Other Ambulatory Visit
Admission: RE | Admit: 2023-06-04 | Discharge: 2023-06-04 | Disposition: A | Discharge status: Home / Self Care | Payer: 59 | Source: Ambulatory Visit | Attending: Internal Medicine | Admitting: Internal Medicine

## 2023-06-04 DIAGNOSIS — J432 Centrilobular emphysema: Secondary | ICD-10-CM | POA: Diagnosis present

## 2023-06-04 DIAGNOSIS — R0602 Shortness of breath: Secondary | ICD-10-CM | POA: Diagnosis present

## 2023-06-04 LAB — D-DIMER, QUANTITATIVE: D-Dimer, Quant: <0.27 ug{FEU}/mL (ref 0.00–0.50)

## 2023-06-04 LAB — TROPONIN I (HIGH SENSITIVITY): Troponin I (High Sensitivity): 7 ng/L (ref <18)

## 2023-06-05 ENCOUNTER — Other Ambulatory Visit: Payer: Self-pay

## 2023-06-05 ENCOUNTER — Encounter: Payer: Self-pay | Admitting: Internal Medicine

## 2023-06-05 DIAGNOSIS — D631 Anemia in chronic kidney disease: Secondary | ICD-10-CM

## 2023-06-05 DIAGNOSIS — D509 Iron deficiency anemia, unspecified: Secondary | ICD-10-CM

## 2023-06-08 ENCOUNTER — Inpatient Hospital Stay: Payer: 59 | Attending: Oncology

## 2023-06-08 DIAGNOSIS — D509 Iron deficiency anemia, unspecified: Secondary | ICD-10-CM | POA: Diagnosis present

## 2023-06-08 DIAGNOSIS — D631 Anemia in chronic kidney disease: Secondary | ICD-10-CM

## 2023-06-08 LAB — IRON AND TIBC
Iron: 21 ug/dL — ABNORMAL LOW (ref 28–170)
Saturation Ratios: 4 % — ABNORMAL LOW (ref 10.4–31.8)
TIBC: 480 ug/dL — ABNORMAL HIGH (ref 250–450)
UIBC: 459 ug/dL

## 2023-06-08 LAB — CBC WITH DIFFERENTIAL/PLATELET
Abs Immature Granulocytes: 0.04 10*3/uL (ref 0.00–0.07)
Basophils Absolute: 0.1 10*3/uL (ref 0.0–0.1)
Basophils Relative: 1 %
Eosinophils Absolute: 0.1 10*3/uL (ref 0.0–0.5)
Eosinophils Relative: 1 %
HCT: 27.6 % — ABNORMAL LOW (ref 36.0–46.0)
Hemoglobin: 8.2 g/dL — ABNORMAL LOW (ref 12.0–15.0)
Immature Granulocytes: 1 %
Lymphocytes Relative: 31 %
Lymphs Abs: 2.2 10*3/uL (ref 0.7–4.0)
MCH: 23.3 pg — ABNORMAL LOW (ref 26.0–34.0)
MCHC: 29.7 g/dL — ABNORMAL LOW (ref 30.0–36.0)
MCV: 78.4 fL — ABNORMAL LOW (ref 80.0–100.0)
Monocytes Absolute: 0.5 10*3/uL (ref 0.1–1.0)
Monocytes Relative: 7 %
Neutro Abs: 4.4 10*3/uL (ref 1.7–7.7)
Neutrophils Relative %: 59 %
Platelets: 252 10*3/uL (ref 150–400)
RBC: 3.52 MIL/uL — ABNORMAL LOW (ref 3.87–5.11)
RDW: 17.9 % — ABNORMAL HIGH (ref 11.5–15.5)
WBC: 7.3 10*3/uL (ref 4.0–10.5)
nRBC: 0 % (ref 0.0–0.2)

## 2023-06-08 LAB — SAMPLE TO BLOOD BANK

## 2023-06-08 LAB — FERRITIN: Ferritin: 5 ng/mL — ABNORMAL LOW (ref 11–307)

## 2023-06-09 ENCOUNTER — Inpatient Hospital Stay: Payer: 59

## 2023-06-09 ENCOUNTER — Encounter: Payer: Self-pay | Admitting: Oncology

## 2023-06-09 ENCOUNTER — Inpatient Hospital Stay (HOSPITAL_BASED_OUTPATIENT_CLINIC_OR_DEPARTMENT_OTHER): Payer: 59 | Admitting: Oncology

## 2023-06-09 VITALS — BP 124/60 | HR 89 | Temp 97.4°F | Resp 18

## 2023-06-09 VITALS — BP 139/64 | HR 93 | Temp 97.6°F | Resp 19 | Wt 209.5 lb

## 2023-06-09 DIAGNOSIS — D509 Iron deficiency anemia, unspecified: Secondary | ICD-10-CM | POA: Diagnosis not present

## 2023-06-09 MED ORDER — SODIUM CHLORIDE 0.9 % IV SOLN
510.0000 mg | Freq: Once | INTRAVENOUS | Status: AC
  Administered 2023-06-09: 510 mg via INTRAVENOUS
  Filled 2023-06-09: qty 17
  Filled 2023-06-09: qty 510

## 2023-06-09 MED ORDER — SODIUM CHLORIDE 0.9 % IV SOLN
INTRAVENOUS | Status: DC
  Filled 2023-06-09: qty 250

## 2023-06-09 NOTE — Progress Notes (Signed)
[facility]  [phone] [phone]  ID: Deanna Gay OB: 02/26/1956  MR#: [id number]  AOZ#:[id number]  Patient Care Team: Lauro Regulus, MD as PCP - General (Internal Medicine) Jeralyn Ruths, MD as Consulting Physician (Oncology)   CHIEF COMPLAINT: Iron deficiency anemia.  INTERVAL HISTORY: Patient returns to clinic today for repeat laboratory work, further evaluation, and consideration of additional IV Feraheme.  She has noticed increased weakness and fatigue and lethargy over the past several weeks.  No reports of dark stools, but is unclear if this is related to her oral iron supplementation. She has no neurologic complaints.  She denies any recent fevers or illnesses.  She denies any chest pain, shortness of breath, cough, or hemoptysis.  She denies any nausea, vomiting, constipation, or diarrhea.  She has no hematochezia.  She has no urinary complaints.  Patient offers no further specific complaints today.  REVIEW OF SYSTEMS:   Review of Systems  Constitutional:  Positive for malaise/fatigue. Negative for fever and weight loss.  Respiratory: Negative.  Negative for cough, hemoptysis and shortness of breath.   Cardiovascular: Negative.  Negative for chest pain and leg swelling.  Gastrointestinal:  Positive for melena. Negative for abdominal pain and blood in stool.  Genitourinary: Negative.  Negative for frequency and hematuria.  Musculoskeletal: Negative.  Negative for myalgias.  Skin: Negative.  Negative for rash.  Neurological:  Positive for weakness. Negative for dizziness, focal weakness and headaches.  Psychiatric/Behavioral: Negative.  The patient is not nervous/anxious.     As per HPI. Otherwise, a complete review of systems is negative.  PAST MEDICAL HISTORY: Past Medical History:  Diagnosis Date   Anemia    Arthritis    Cancer (HCC)    cervical   COPD (chronic obstructive pulmonary disease) (HCC)    COPD  exacerbation (HCC) 07/19/2017   Depression    Diabetes mellitus without complication (HCC)    GERD (gastroesophageal reflux disease)    Hypertension    Schizophrenia (HCC)    Sleep apnea     PAST SURGICAL HISTORY: Past Surgical History:  Procedure Laterality Date   APPENDECTOMY     COLONOSCOPY N/A 01/24/2021   Procedure: COLONOSCOPY;  Surgeon: Jaynie Collins, DO;  Location: [facility] ENDOSCOPY;  Service: Gastroenterology;  Laterality: N/A;   COLONOSCOPY WITH PROPOFOL N/A 12/25/2014   Procedure: COLONOSCOPY WITH PROPOFOL;  Surgeon: Elnita Maxwell, MD;  Location: [facility] ENDOSCOPY;  Service: Endoscopy;  Laterality: N/A;   COLONOSCOPY WITH PROPOFOL N/A 01/21/2018   Procedure: COLONOSCOPY WITH PROPOFOL;  Surgeon: Toney Reil, MD;  Location: [facility] ENDOSCOPY;  Service: Gastroenterology;  Laterality: N/A;   ESOPHAGOGASTRODUODENOSCOPY N/A 01/24/2021   Procedure: ESOPHAGOGASTRODUODENOSCOPY (EGD);  Surgeon: Jaynie Collins, DO;  Location: [facility] ENDOSCOPY;  Service: Gastroenterology;  Laterality: N/A;   ESOPHAGOGASTRODUODENOSCOPY (EGD) WITH PROPOFOL N/A 12/25/2014   Procedure: ESOPHAGOGASTRODUODENOSCOPY (EGD) WITH PROPOFOL;  Surgeon: Elnita Maxwell, MD;  Location: [facility] ENDOSCOPY;  Service: Endoscopy;  Laterality: N/A;   ESOPHAGOGASTRODUODENOSCOPY (EGD) WITH PROPOFOL N/A 01/21/2018   Procedure: ESOPHAGOGASTRODUODENOSCOPY (EGD) WITH PROPOFOL;  Surgeon: Toney Reil, MD;  Location: [facility] ENDOSCOPY;  Service: Gastroenterology;  Laterality: N/A;   GIVENS CAPSULE STUDY N/A 03/02/2018   Procedure: GIVENS CAPSULE STUDY;  Surgeon: Toney Reil, MD;  Location: [facility] ENDOSCOPY;  Service: Gastroenterology;  Laterality: N/A;   JOINT REPLACEMENT     right hip   TOTAL HIP ARTHROPLASTY Left 05/16/2016   Procedure: LEFT TOTAL HIP ARTHROPLASTY ANTERIOR APPROACH;  Surgeon: Kathryne Hitch, MD;  Location: [facility] ORS;  Service: Orthopedics;  Laterality: Left;    FAMILY HISTORY Family  History  Problem Relation Age of Onset   Hypertension Mother    Diabetes Mellitus II Mother    Breast cancer Neg Hx    Kidney cancer Neg Hx    Bladder Cancer Neg Hx        ADVANCED DIRECTIVES:    HEALTH MAINTENANCE: Social History   Tobacco Use   Smoking status: Every Day    Current packs/day: 1.00    Average packs/day: 1 pack/day for 48.0 years (48.0 ttl pk-yrs)    Types: Cigarettes   Smokeless tobacco: Never  Vaping Use   Vaping status: Some Days  Substance Use Topics   Alcohol use: No   Drug use: No     Colonoscopy:  PAP:  Bone density:  Lipid panel:  Allergies  Allergen Reactions   Penicillins Swelling    Has patient had a PCN reaction causing immediate rash, facial/tongue/throat swelling, SOB or lightheadedness with hypotension: Yes Has patient had a PCN reaction causing severe rash involving mucus membranes or skin necrosis: Yes Has patient had a PCN reaction that required hospitalization: No Has patient had a PCN reaction occurring within the last 10 years: No If all of the above answers are "NO", then may proceed with Cephalosporin use.  Has patient had a PCN reaction causing immediate rash, facial/tongue/throat swelling, SOB or lightheadedness with hypotension: Yes Has patient had a PCN reaction causing severe rash involving mucus membranes or skin necrosis: Yes Has patient had a PCN reaction that required hospitalization: No Has patient had a PCN reaction occurring within the last 10 years: No If all of the above answers are "NO", then may proceed with Cephalosporin use. Has patient had a PCN reaction causing immediate rash, faci   Aspirin Other (See Comments)    Ongoing anemia   Iodinated Contrast Media Swelling   Iodine Swelling    Current Outpatient Medications  Medication Sig Dispense Refill   ACCU-CHEK AVIVA PLUS test strip use 1 TEST STRIP to TEST BLOOD SUGAR four times a day  0   albuterol (PROVENTIL HFA;VENTOLIN HFA) 108 (90 BASE) MCG/ACT  inhaler Inhale 2 puffs into the lungs every 4 (four) hours as needed for wheezing or shortness of breath.      atorvastatin (LIPITOR) 40 MG tablet Take 40 mg by mouth daily.     BANOPHEN 25 MG capsule SMARTSIG:2 Capsule(s) By Mouth     Blood Glucose Monitoring Suppl (ACCU-CHEK AVIVA PLUS) w/Device KIT See admin instructions.  0   budesonide-formoterol (SYMBICORT) 160-4.5 MCG/ACT inhaler Inhale 2 puffs into the lungs 2 (two) times daily.     Calcium Carb-Cholecalciferol 600-400 MG-UNIT TABS Take 1 tablet by mouth daily.     calcium carbonate (OSCAL) 1500 (600 Ca) MG TABS tablet Take by mouth 2 (two) times daily with a meal.     celecoxib (CELEBREX) 200 MG capsule Take 200 mg by mouth every 12 (twelve) hours.     cyclobenzaprine (FLEXERIL) 10 MG tablet Take 10 mg by mouth at bedtime.     diltiazem (CARDIZEM CD) 180 MG 24 hr capsule Take 180 mg by mouth at bedtime.      diltiazem (CARDIZEM) 60 MG tablet Take tablet by mouth TWO hours prior to your cardiac CT scan. 1 tablet 0   diltiazem (TIAZAC) 180 MG 24 hr capsule Take by mouth.     docusate sodium (COLACE) 100 MG capsule Take 100 mg by mouth 2 (two)  times daily.     escitalopram (LEXAPRO) 10 MG tablet Take 10 mg by mouth daily.     fexofenadine (ALLEGRA) 180 MG tablet Take 180 mg by mouth daily.     fluticasone (FLONASE) 50 MCG/ACT nasal spray Place 2 sprays into both nostrils daily.     furosemide (LASIX) 20 MG tablet Take 20 mg by mouth daily.     hydrochlorothiazide (HYDRODIURIL) 25 MG tablet take 1 tablet by mouth every morning     insulin glargine (LANTUS) 100 UNIT/ML injection Inject 0.1 mLs (10 Units total) into the skin at bedtime. 10 mL 11   insulin lispro (HUMALOG) 100 UNIT/ML injection Please check your sugars before each meal and use lispro per the below sliding scale:  For sugars 151-200: take 2 units For 201-250: take 4units For 251-300: take 6 units For 301-350: take 8 units For 351-400: take 10 units For > 401: call MD 10 mL  11   Insulin Syringe-Needle U-100 31G X 5/16" 0.5 ML MISC use as directed once daily     ipratropium-albuterol (DUONEB) 0.5-2.5 (3) MG/3ML SOLN Take 3 mLs by nebulization every 6 (six) hours as needed.     iron polysaccharides (NIFEREX) 150 MG capsule Take 1 capsule (150 mg total) by mouth 2 (two) times daily. 90 capsule 3   JARDIANCE 10 MG TABS tablet Take 10 mg by mouth daily.     losartan (COZAAR) 50 MG tablet Take 50 mg by mouth daily.     Magnesium 250 MG TABS Take by mouth daily.     meloxicam (MOBIC) 15 MG tablet Take 15 mg by mouth daily.     metoprolol succinate (TOPROL-XL) 25 MG 24 hr tablet Take 25 mg by mouth daily.     metoprolol tartrate (LOPRESSOR) 100 MG tablet Take 1 tablet (100 mg total) by mouth once for 1 dose. Please take one time dose 100mg  metoprolol tartrate 2 hr prior to cardiac CT for HR control IF HR >55bpm. 1 tablet 0   montelukast (SINGULAIR) 10 MG tablet Take 10 mg by mouth at bedtime.     MOUNJARO 5 MG/0.5ML Pen Inject 5 mg into the skin once a week.     Multiple Vitamins-Minerals (ZINC PO) Take by mouth.     mupirocin ointment (BACTROBAN) 2 % Apply 1 Application topically 2 (two) times daily. Apply to wounds QD. 22 g 11   Naloxone HCl (EVZIO) 0.4 MG/0.4ML SOAJ Inject as directed.     nitroGLYCERIN (NITROSTAT) 0.4 MG SL tablet Place 1 tablet under the tongue every 5 (five) minutes as needed.  0   nitroGLYCERIN (NITROSTAT) 0.4 MG SL tablet Place under the tongue.     omeprazole (PRILOSEC) 40 MG capsule Take 40 mg by mouth 2 (two) times daily.     Oxycodone HCl 10 MG TABS Take 10 mg by mouth every 6 (six) hours.     oxyCODONE-acetaminophen (PERCOCET) 7.5-325 MG tablet Take 1-2 tablets by mouth every 6 (six) hours as needed for severe pain. 30 tablet 0   potassium chloride (KLOR-CON) 10 MEQ tablet Take 10 mEq by mouth daily.     predniSONE (DELTASONE) 10 MG tablet Label  & dispense according to the schedule below. 5 Pills PO for 1 day then, 4 Pills PO for 1 day, 3  Pills PO for 1 day, 2 Pills PO for 1 day, 1 Pill PO for 1 days then STOP. 15 tablet 0   predniSONE (DELTASONE) 50 MG tablet Take table by mouth 13 hours, 7  hours, and 1 hour prior to your cardiac CT scan. 3 tablet 0   pregabalin (LYRICA) 25 MG capsule Take 25 mg by mouth 2 (two) times daily.     roflumilast (DALIRESP) 500 MCG TABS tablet Take 500 mcg by mouth daily.     tiotropium (SPIRIVA) 18 MCG inhalation capsule Place 18 mcg into inhaler and inhale daily.     triamcinolone (KENALOG) 0.1 % paste Use as directed 1 application in the mouth or throat 2 (two) times daily.     TRULICITY 0.75 MG/0.5ML SOPN Inject 0.75 mg into the skin once a week.     vitamin B-12 (CYANOCOBALAMIN) 500 MCG tablet Take 500 mcg by mouth daily.     No current facility-administered medications for this visit.   Facility-Administered Medications Ordered in Other Visits  Medication Dose Route Frequency Provider Last Rate Last Admin   ferumoxytol (FERAHEME) 510 mg in sodium chloride 0.9 % 100 mL IVPB  510 mg Intravenous Once Alinda Dooms, NP        OBJECTIVE: Vitals:   06/09/23 1315  BP: 139/64  Pulse: 93  Resp: 19  Temp: 97.6 F (36.4 C)  SpO2: 95%     Body mass index is 35.96 kg/m.    ECOG FS:1 - Symptomatic but completely ambulatory  General: Well-developed, well-nourished, no acute distress. Eyes: Pink conjunctiva, anicteric sclera. HEENT: Normocephalic, moist mucous membranes. Lungs: No audible wheezing or coughing. Heart: Regular rate and rhythm. Abdomen: Soft, nontender, no obvious distention. Musculoskeletal: No edema, cyanosis, or clubbing. Neuro: Alert, answering all questions appropriately. Cranial nerves grossly intact. Skin: No rashes or petechiae noted. Psych: Normal affect.  LAB RESULTS:  Lab Results  Component Value Date   NA 133 (L) 03/26/2023   K 3.3 (L) 03/26/2023   CL 94 (L) 03/26/2023   CO2 27 03/26/2023   GLUCOSE 153 (H) 03/26/2023   BUN 14 03/26/2023   CREATININE 1.25  (H) 03/26/2023   CALCIUM 8.4 (L) 03/26/2023   PROT 7.9 03/26/2023   ALBUMIN 3.1 (L) 03/26/2023   AST 14 (L) 03/26/2023   ALT 9 03/26/2023   ALKPHOS 97 03/26/2023   BILITOT 0.6 03/26/2023   GFRNONAA 47 (L) 03/26/2023   GFRAA 49 (L) 10/31/2019    Lab Results  Component Value Date   WBC 7.3 06/08/2023   NEUTROABS 4.4 06/08/2023   HGB 8.2 (L) 06/08/2023   HCT 27.6 (L) 06/08/2023   MCV 78.4 (L) 06/08/2023   PLT 252 06/08/2023   Lab Results  Component Value Date   FERRITIN 5 (L) 06/08/2023   Lab Results  Component Value Date   IRON 21 (L) 06/08/2023   TIBC 480 (H) 06/08/2023   IRONPCTSAT 4 (L) 06/08/2023      STUDIES: CT CHEST LUNG CA SCREEN LOW DOSE W/O CM Result Date: 06/07/2023 CLINICAL DATA:  68 year old female current smoker with 53 pack-year history of smoking. Lung cancer screening examination. EXAM: CT CHEST WITHOUT CONTRAST LOW-DOSE FOR LUNG CANCER SCREENING TECHNIQUE: Multidetector CT imaging of the chest was performed following the standard protocol without IV contrast. RADIATION DOSE REDUCTION: This exam was performed according to the departmental dose-optimization program which includes automated exposure control, adjustment of the mA and/or kV according to patient size and/or use of iterative reconstruction technique. COMPARISON:  Low-dose lung cancer screening chest CT 05/02/2022. FINDINGS: Cardiovascular: Heart size is normal. There is no significant pericardial fluid, thickening or pericardial calcification. Atherosclerotic calcifications in the thoracic aorta. No definite coronary artery calcifications. Dilatation of the pulmonic trunk (  3.8 cm in diameter). Mediastinum/Nodes: No pathologically enlarged mediastinal or hilar lymph nodes. Please note that accurate exclusion of hilar adenopathy is limited on noncontrast CT scans. Esophagus is unremarkable in appearance. No axillary lymphadenopathy. Lungs/Pleura: Multiple small pulmonary nodules are generally stable  compared to the prior study. The exception of this is a new nodular density in the lingula, with a volume derived mean diameter of 20.5 mm (axial image 185 of series 4). No acute consolidative airspace disease. No pleural effusions. Mild diffuse bronchial wall thickening with mild centrilobular and paraseptal emphysema. Upper Abdomen: Unremarkable. Musculoskeletal: There are no aggressive appearing lytic or blastic lesions noted in the visualized portions of the skeleton. IMPRESSION: 1. New large nodule in the lingula categorized as Lung-RADS 4B, suspicious. Additional imaging evaluation or consultation with Pulmonology or Thoracic Surgery recommended. 2. Aortic atherosclerosis. 3. Dilatation of the pulmonic trunk (3.8 cm in diameter), concerning for pulmonary arterial hypertension. 4. Mild diffuse bronchial wall thickening with mild centrilobular and paraseptal emphysema; imaging findings suggestive of underlying COPD. Aortic Atherosclerosis (ICD10-I70.0) and Emphysema (ICD10-J43.9). Electronically Signed   By: Trudie Reed M.D.   On: 06/07/2023 10:58    ASSESSMENT: Iron deficiency anemia.  PLAN:    Iron deficiency anemia: Patient's hemoglobin and iron stores have significantly dropped and she is symptomatic.  Patient's hemoglobin and iron stores continue to be within normal limits.  Her most recent colonoscopy and endoscopy on January 24, 2021 revealed multiple polyps, but no other significant pathology.  Given her possible melena, a referral has been sent back to GI for further evaluation.  Proceed with 510 mg IV Feraheme today.  Return to clinic later this week and next week for additional infusions.  Patient would then return to clinic in 3 months with repeat laboratory work, further evaluation, and continuation of treatment if needed.    I spent a total of 30 minutes reviewing chart data, face-to-face evaluation with the patient, counseling and coordination of care as detailed  above.    Patient expressed understanding and was in agreement with this plan. She also understands that She can call clinic at any time with any questions, concerns, or complaints.    Jeralyn Ruths, MD   06/09/2023 1:40 PM

## 2023-06-09 NOTE — Progress Notes (Signed)
Patient states she is currently experiencing shortness of breathe and has little to no energy. Patient believes she needs blood or some Iron. She states she would also like to get a b12 shot if possible.

## 2023-06-09 NOTE — Patient Instructions (Signed)
Ferumoxytol Injection Quel est ce mdicament ? Le FRUMOXYTOL traite les faibles taux de fer dans l'organisme (anmie ferriprive). Le fer est un minral qui joue un rle important dans la production des globules rouges qui transportent l'oxygne des poumons vers les autres organes. Ce mdicament peut tre utilis pour d'autres indications ; adressez-vous  votre mdecin ou  votre pharmacien si vous [employer] questions. NOM(S) DE MARQUE COURANT(S) : Feraheme Que dois-je dire  mon fournisseur de Consulting civil engineer de prendre ce mdicament ? Votre quipe de soins a besoin de savoir si vous tes dans l?une des situations suivantes : Anmie non due  de faibles taux de fer Taux levs de [employer] sang Examen programm d'imagerie par rsonance magntique (IRM) Raction inhabituelle ou allergique au fer,  d?autres mdicaments,  des aliments,  des colorants ou  des conservateurs [place] ou projet de grossesse Allaitement Comment dois-je utiliser ce mdicament ? Ce mdicament est destin  tre inject dans une veine. Il est administr par votre quipe de [employer] un tablissement [employer] ou un Biochemist, clinical. [employer] toute information relative  l'utilisation de ce mdicament [employer], Electrical engineer votre quipe de [place]. Des prcautions particulires peuvent s?avrer ncessaires. Surdosage : si vous pensez avoir pris ce mdicament en excs, contactez immdiatement un centre [employer].<br />REMARQUE : ce mdicament vous est uniquement destin. Ne partagez pas ce Arts administrator. Que faire si je saute une dose ? Il est important de ne pas omettre votre dose. Appelez votre quipe de soins si vous ne pouvez pas Barrister's clerk. Quelles sont les interactions possibles avec ce mdicament ? Autres produits  base de fer Cette liste peut ne pas dcrire toutes les interactions mdicamenteuses possibles. Donnez  votre fournisseur de [employer]  de tous les mdicaments, plantes mdicinales, mdicaments en vente libre ou complments alimentaires que vous prenez. Dites-lui aussi si vous fumez, buvez de l'alcool ou consommez des drogues illicites. Certaines substances peuvent interagir Writer. Que dois-je IT consultant par ce mdicament ? Consultez rgulirement votre quipe de soins pour un bilan. Informez votre quipe de soins si vos symptmes ne commencent pas  s'amliorer ou s'ils s'aggravent. Vous devrez Geographical information systems officer des analyses de sang Art therapist par [employer]. Vous devrez ventuellement consommer davantage d'aliments Nurse, learning disability. Consultez votre quipe de [place]. Les aliments contenant du fer incluent les crales compltes, les fruits secs, les haricots, les pois, les lgumes verts  feuilles et les abats [employer], rein). Quels effets secondaires puis-je remarquer en prenant ce mdicament ? Effets secondaires que vous devez signaler  votre quipe de soins ds que possible : Ractions allergiques : ruption cutane, dmangeaisons, urticaire, gonflement du visage, des lvres, de la langue ou de la gorge Hypotension artrielle : vertiges, sensation d?vanouissement ou d?tourdissement, vision trouble [place] Effets secondaires ne ncessitant gnralement pas un avis mdical (signalez-les  votre quipe de soins s'ils persistent ou sont gnants) : Bouffes congestives Mal de tte Douleurs articulaires Douleurs musculaires Nauses Douleur, rougeur ou irritation au site d?injection Curator ne pas dcrire tous les effets secondaires possibles. Appelez votre mdecin pour [employer] sujet des [employer]. Vous pouvez signaler les effets secondaires  la FDA, au [phone]. O dois-je conserver mon mdicament ? Ce mdicament est administr dans un tablissement [employer]. Il ne sera pas conserv  domicile. REMARQUE  : cette notice est un rsum. Elle peut ne pas contenir toutes les  informations possibles. Si vous avez des questions  propos de ce mdicament, Patent attorney mdecin, votre pharmacien ou votre fournisseur de [place].  2024 Elsevier/Gold Standard (2023-03-02 00:00:00)

## 2023-06-11 ENCOUNTER — Telehealth: Payer: Self-pay | Admitting: Acute Care

## 2023-06-11 DIAGNOSIS — R911 Solitary pulmonary nodule: Secondary | ICD-10-CM

## 2023-06-11 NOTE — Telephone Encounter (Signed)
I have called the patient with the results of her Low Dose Ct Chest. Her scan was read as a LR 4B, there is a new nodular density in the lingula measuring 20.5 mm.  I explained that we will do a PET scan to better evaluate the nodule of concern and then have her follow up in the [place] office with one of the physicians there within a week of the scan being completed.  Pt. Is in agreement with the plan.  Foye Clock, will you get patient on one of the docs schedules within a week of the scan being scheduled and completed to review results and determine next steps of care.  Dr. Jayme Cloud, Dgayly, and Assaker, will you please circle back to [place] with the plan so we can keep our dashboard updated?  Thank you so much everyone

## 2023-06-11 NOTE — Telephone Encounter (Signed)
Noted  

## 2023-06-12 ENCOUNTER — Inpatient Hospital Stay: Payer: 59

## 2023-06-12 VITALS — BP 128/63 | HR 93 | Temp 96.8°F | Resp 18

## 2023-06-12 DIAGNOSIS — D509 Iron deficiency anemia, unspecified: Secondary | ICD-10-CM

## 2023-06-12 MED ORDER — SODIUM CHLORIDE 0.9 % IV SOLN
510.0000 mg | Freq: Once | INTRAVENOUS | Status: AC
  Administered 2023-06-12: 510 mg via INTRAVENOUS
  Filled 2023-06-12: qty 510

## 2023-06-12 MED ORDER — SODIUM CHLORIDE 0.9 % IV SOLN
Freq: Once | INTRAVENOUS | Status: AC
  Filled 2023-06-12: qty 250

## 2023-06-12 NOTE — Telephone Encounter (Signed)
PET is scheduled for 1/30.   Lm x1 for the patient.

## 2023-06-15 ENCOUNTER — Other Ambulatory Visit: Payer: 59

## 2023-06-16 ENCOUNTER — Ambulatory Visit: Payer: 59

## 2023-06-16 ENCOUNTER — Ambulatory Visit: Payer: 59 | Admitting: Oncology

## 2023-06-16 NOTE — Telephone Encounter (Signed)
Lm x2 for the patient to call our office back.

## 2023-06-17 ENCOUNTER — Other Ambulatory Visit: Payer: 59

## 2023-06-18 ENCOUNTER — Ambulatory Visit: Payer: 59

## 2023-06-18 NOTE — Telephone Encounter (Signed)
LM x3 for the patient. I have also left a message with the patient's mother (DPR) to give our office a call back. I will try her one more time due to the nature of the call.

## 2023-06-19 ENCOUNTER — Inpatient Hospital Stay: Payer: 59

## 2023-06-19 VITALS — BP 126/64 | HR 91 | Temp 96.3°F | Resp 19

## 2023-06-19 DIAGNOSIS — D509 Iron deficiency anemia, unspecified: Secondary | ICD-10-CM

## 2023-06-19 MED ORDER — SODIUM CHLORIDE 0.9 % IV SOLN
510.0000 mg | Freq: Once | INTRAVENOUS | Status: AC
  Administered 2023-06-19: 510 mg via INTRAVENOUS
  Filled 2023-06-19: qty 510

## 2023-06-19 NOTE — Telephone Encounter (Signed)
I spoke with the patient and scheduled her an appt for 2/21.  Nothing further needed.

## 2023-07-06 ENCOUNTER — Ambulatory Visit: Payer: 59 | Admitting: Acute Care

## 2023-07-10 ENCOUNTER — Institutional Professional Consult (permissible substitution): Payer: 59 | Admitting: Pulmonary Disease

## 2023-07-10 ENCOUNTER — Encounter: Payer: Self-pay | Admitting: Oncology

## 2023-07-10 ENCOUNTER — Encounter: Admission: RE | Admit: 2023-07-10 | Payer: 59 | Source: Ambulatory Visit

## 2023-07-14 ENCOUNTER — Institutional Professional Consult (permissible substitution): Payer: 59 | Admitting: Pulmonary Disease

## 2023-07-14 ENCOUNTER — Ambulatory Visit: Payer: 59

## 2023-07-24 ENCOUNTER — Ambulatory Visit
Admission: RE | Admit: 2023-07-24 | Discharge: 2023-07-24 | Disposition: A | Discharge status: Home / Self Care | Payer: 59 | Source: Ambulatory Visit | Attending: Acute Care | Admitting: Acute Care

## 2023-07-24 DIAGNOSIS — R911 Solitary pulmonary nodule: Secondary | ICD-10-CM | POA: Diagnosis present

## 2023-07-24 LAB — GLUCOSE, CAPILLARY: Glucose-Capillary: 108 mg/dL — ABNORMAL HIGH (ref 70–99)

## 2023-07-24 MED ORDER — FLUDEOXYGLUCOSE F - 18 (FDG) INJECTION
10.1800 | Freq: Once | INTRAVENOUS | Status: AC | PRN
  Administered 2023-07-24: 10.18 via INTRAVENOUS

## 2023-08-19 ENCOUNTER — Other Ambulatory Visit: Payer: Self-pay | Admitting: *Deleted

## 2023-08-19 ENCOUNTER — Inpatient Hospital Stay: Payer: 59 | Attending: Oncology

## 2023-08-19 DIAGNOSIS — Z79899 Other long term (current) drug therapy: Secondary | ICD-10-CM | POA: Insufficient documentation

## 2023-08-19 DIAGNOSIS — F17210 Nicotine dependence, cigarettes, uncomplicated: Secondary | ICD-10-CM | POA: Insufficient documentation

## 2023-08-19 DIAGNOSIS — D509 Iron deficiency anemia, unspecified: Secondary | ICD-10-CM | POA: Diagnosis present

## 2023-08-19 LAB — CBC WITH DIFFERENTIAL/PLATELET
Abs Immature Granulocytes: 0.02 10*3/uL (ref 0.00–0.07)
Basophils Absolute: 0.1 10*3/uL (ref 0.0–0.1)
Basophils Relative: 1 %
Eosinophils Absolute: 0.1 10*3/uL (ref 0.0–0.5)
Eosinophils Relative: 2 %
HCT: 43.7 % (ref 36.0–46.0)
Hemoglobin: 13.4 g/dL (ref 12.0–15.0)
Immature Granulocytes: 0 %
Lymphocytes Relative: 31 %
Lymphs Abs: 1.7 10*3/uL (ref 0.7–4.0)
MCH: 26.3 pg (ref 26.0–34.0)
MCHC: 30.7 g/dL (ref 30.0–36.0)
MCV: 85.9 fL (ref 80.0–100.0)
Monocytes Absolute: 0.4 10*3/uL (ref 0.1–1.0)
Monocytes Relative: 7 %
Neutro Abs: 3.2 10*3/uL (ref 1.7–7.7)
Neutrophils Relative %: 59 %
Platelets: 216 10*3/uL (ref 150–400)
RBC: 5.09 MIL/uL (ref 3.87–5.11)
RDW: 18.5 % — ABNORMAL HIGH (ref 11.5–15.5)
WBC: 5.3 10*3/uL (ref 4.0–10.5)
nRBC: 0 % (ref 0.0–0.2)

## 2023-08-19 LAB — IRON AND TIBC
Iron: 42 ug/dL (ref 28–170)
Saturation Ratios: 11 % (ref 10.4–31.8)
TIBC: 386 ug/dL (ref 250–450)
UIBC: 344 ug/dL

## 2023-08-19 LAB — SAMPLE TO BLOOD BANK

## 2023-08-19 LAB — FERRITIN: Ferritin: 22 ng/mL (ref 11–307)

## 2023-08-20 ENCOUNTER — Inpatient Hospital Stay: Payer: 59 | Admitting: Oncology

## 2023-08-20 ENCOUNTER — Inpatient Hospital Stay (HOSPITAL_BASED_OUTPATIENT_CLINIC_OR_DEPARTMENT_OTHER): Admitting: Oncology

## 2023-08-20 ENCOUNTER — Encounter: Payer: Self-pay | Admitting: Oncology

## 2023-08-20 ENCOUNTER — Inpatient Hospital Stay: Payer: 59

## 2023-08-20 ENCOUNTER — Inpatient Hospital Stay

## 2023-08-20 VITALS — BP 129/67 | HR 100 | Temp 96.6°F | Resp 19 | Ht 64.0 in | Wt 208.0 lb

## 2023-08-20 DIAGNOSIS — D509 Iron deficiency anemia, unspecified: Secondary | ICD-10-CM

## 2023-08-20 NOTE — Progress Notes (Signed)
 [facility]  [phone] [phone]  ID: Deanna Gay OB: 08-29-1955  MR#: [id number]  AOZ#:[id number]  Patient Care Team: Lauro Regulus, MD as PCP - General (Internal Medicine) Jeralyn Ruths, MD as Consulting Physician (Oncology)   CHIEF COMPLAINT: Iron deficiency anemia.  INTERVAL HISTORY: Patient returns to clinic today for repeat laboratory work, further evaluation, and consideration of additional IV Feraheme.  She continues to have chronic fatigue, but otherwise feels well.  She has no neurologic complaints.  She denies any recent fevers or illnesses.  She denies any chest pain, shortness of breath, cough, or hemoptysis.  She denies any nausea, vomiting, constipation, or diarrhea.  She has no melena or hematochezia.  She has no urinary complaints.  Patient offers no further specific complaints today.  REVIEW OF SYSTEMS:   Review of Systems  Constitutional:  Positive for malaise/fatigue. Negative for fever and weight loss.  Respiratory: Negative.  Negative for cough, hemoptysis and shortness of breath.   Cardiovascular: Negative.  Negative for chest pain and leg swelling.  Gastrointestinal: Negative.  Negative for abdominal pain, blood in stool and melena.  Genitourinary: Negative.  Negative for frequency and hematuria.  Musculoskeletal: Negative.  Negative for myalgias.  Skin: Negative.  Negative for rash.  Neurological: Negative.  Negative for dizziness, focal weakness, weakness and headaches.  Psychiatric/Behavioral: Negative.  The patient is not nervous/anxious.     As per HPI. Otherwise, a complete review of systems is negative.  PAST MEDICAL HISTORY: Past Medical History:  Diagnosis Date   Anemia    Arthritis    Cancer (HCC)    cervical   COPD (chronic obstructive pulmonary disease) (HCC)    COPD exacerbation (HCC) 07/19/2017   Depression    Diabetes mellitus without complication (HCC)    GERD  (gastroesophageal reflux disease)    Hypertension    Schizophrenia (HCC)    Sleep apnea     PAST SURGICAL HISTORY: Past Surgical History:  Procedure Laterality Date   APPENDECTOMY     COLONOSCOPY N/A 01/24/2021   Procedure: COLONOSCOPY;  Surgeon: Jaynie Collins, DO;  Location: [facility] ENDOSCOPY;  Service: Gastroenterology;  Laterality: N/A;   COLONOSCOPY WITH PROPOFOL N/A 12/25/2014   Procedure: COLONOSCOPY WITH PROPOFOL;  Surgeon: Elnita Maxwell, MD;  Location: [facility] ENDOSCOPY;  Service: Endoscopy;  Laterality: N/A;   COLONOSCOPY WITH PROPOFOL N/A 01/21/2018   Procedure: COLONOSCOPY WITH PROPOFOL;  Surgeon: Toney Reil, MD;  Location: [facility] ENDOSCOPY;  Service: Gastroenterology;  Laterality: N/A;   ESOPHAGOGASTRODUODENOSCOPY N/A 01/24/2021   Procedure: ESOPHAGOGASTRODUODENOSCOPY (EGD);  Surgeon: Jaynie Collins, DO;  Location: [facility] ENDOSCOPY;  Service: Gastroenterology;  Laterality: N/A;   ESOPHAGOGASTRODUODENOSCOPY (EGD) WITH PROPOFOL N/A 12/25/2014   Procedure: ESOPHAGOGASTRODUODENOSCOPY (EGD) WITH PROPOFOL;  Surgeon: Elnita Maxwell, MD;  Location: [facility] ENDOSCOPY;  Service: Endoscopy;  Laterality: N/A;   ESOPHAGOGASTRODUODENOSCOPY (EGD) WITH PROPOFOL N/A 01/21/2018   Procedure: ESOPHAGOGASTRODUODENOSCOPY (EGD) WITH PROPOFOL;  Surgeon: Toney Reil, MD;  Location: [facility] ENDOSCOPY;  Service: Gastroenterology;  Laterality: N/A;   GIVENS CAPSULE STUDY N/A 03/02/2018   Procedure: GIVENS CAPSULE STUDY;  Surgeon: Toney Reil, MD;  Location: [facility] ENDOSCOPY;  Service: Gastroenterology;  Laterality: N/A;   JOINT REPLACEMENT     right hip   TOTAL HIP ARTHROPLASTY Left 05/16/2016   Procedure: LEFT TOTAL HIP ARTHROPLASTY ANTERIOR APPROACH;  Surgeon: Kathryne Hitch, MD;  Location: [facility] ORS;  Service: Orthopedics;  Laterality: Left;    FAMILY HISTORY Family History  Problem Relation Age  of Onset   Hypertension Mother    Diabetes Mellitus II Mother    Breast  cancer Neg Hx    Kidney cancer Neg Hx    Bladder Cancer Neg Hx        ADVANCED DIRECTIVES:    HEALTH MAINTENANCE: Social History   Tobacco Use   Smoking status: Every Day    Current packs/day: 1.00    Average packs/day: 1 pack/day for 48.0 years (48.0 ttl pk-yrs)    Types: Cigarettes   Smokeless tobacco: Never  Vaping Use   Vaping status: Some Days  Substance Use Topics   Alcohol use: No   Drug use: No     Colonoscopy:  PAP:  Bone density:  Lipid panel:  Allergies  Allergen Reactions   Penicillins Swelling    Has patient had a PCN reaction causing immediate rash, facial/tongue/throat swelling, SOB or lightheadedness with hypotension: Yes Has patient had a PCN reaction causing severe rash involving mucus membranes or skin necrosis: Yes Has patient had a PCN reaction that required hospitalization: No Has patient had a PCN reaction occurring within the last 10 years: No If all of the above answers are "NO", then may proceed with Cephalosporin use.  Has patient had a PCN reaction causing immediate rash, facial/tongue/throat swelling, SOB or lightheadedness with hypotension: Yes Has patient had a PCN reaction causing severe rash involving mucus membranes or skin necrosis: Yes Has patient had a PCN reaction that required hospitalization: No Has patient had a PCN reaction occurring within the last 10 years: No If all of the above answers are "NO", then may proceed with Cephalosporin use. Has patient had a PCN reaction causing immediate rash, faci   Aspirin Other (See Comments)    Ongoing anemia   Iodinated Contrast Media Swelling   Iodine Swelling    Current Outpatient Medications  Medication Sig Dispense Refill   ACCU-CHEK AVIVA PLUS test strip use 1 TEST STRIP to TEST BLOOD SUGAR four times a day  0   albuterol (PROVENTIL HFA;VENTOLIN HFA) 108 (90 BASE) MCG/ACT inhaler Inhale 2 puffs into the lungs every 4 (four) hours as needed for wheezing or shortness of breath.       atorvastatin (LIPITOR) 40 MG tablet Take 40 mg by mouth daily.     BANOPHEN 25 MG capsule SMARTSIG:2 Capsule(s) By Mouth     Blood Glucose Monitoring Suppl (ACCU-CHEK AVIVA PLUS) w/Device KIT See admin instructions.  0   budesonide-formoterol (SYMBICORT) 160-4.5 MCG/ACT inhaler Inhale 2 puffs into the lungs 2 (two) times daily.     Calcium Carb-Cholecalciferol 600-400 MG-UNIT TABS Take 1 tablet by mouth daily.     calcium carbonate (OSCAL) 1500 (600 Ca) MG TABS tablet Take by mouth 2 (two) times daily with a meal.     celecoxib (CELEBREX) 200 MG capsule Take 200 mg by mouth every 12 (twelve) hours.     cyclobenzaprine (FLEXERIL) 10 MG tablet Take 10 mg by mouth at bedtime.     diltiazem (CARDIZEM CD) 180 MG 24 hr capsule Take 180 mg by mouth at bedtime.      diltiazem (CARDIZEM) 60 MG tablet Take tablet by mouth TWO hours prior to your cardiac CT scan. 1 tablet 0   diltiazem (TIAZAC) 180 MG 24 hr capsule Take by mouth.     docusate sodium (COLACE) 100 MG capsule Take 100 mg by mouth 2 (two) times daily.     escitalopram (LEXAPRO) 10 MG tablet Take 10 mg by mouth daily.  fexofenadine (ALLEGRA) 180 MG tablet Take 180 mg by mouth daily.     fluticasone (FLONASE) 50 MCG/ACT nasal spray Place 2 sprays into both nostrils daily.     furosemide (LASIX) 20 MG tablet Take 20 mg by mouth daily.     hydrochlorothiazide (HYDRODIURIL) 25 MG tablet take 1 tablet by mouth every morning     insulin glargine (LANTUS) 100 UNIT/ML injection Inject 0.1 mLs (10 Units total) into the skin at bedtime. 10 mL 11   insulin lispro (HUMALOG) 100 UNIT/ML injection Please check your sugars before each meal and use lispro per the below sliding scale:  For sugars 151-200: take 2 units For 201-250: take 4units For 251-300: take 6 units For 301-350: take 8 units For 351-400: take 10 units For > 401: call MD 10 mL 11   Insulin Syringe-Needle U-100 31G X 5/16" 0.5 ML MISC use as directed once daily      ipratropium-albuterol (DUONEB) 0.5-2.5 (3) MG/3ML SOLN Take 3 mLs by nebulization every 6 (six) hours as needed.     iron polysaccharides (NIFEREX) 150 MG capsule Take 1 capsule (150 mg total) by mouth 2 (two) times daily. 90 capsule 3   JARDIANCE 10 MG TABS tablet Take 10 mg by mouth daily.     losartan (COZAAR) 50 MG tablet Take 50 mg by mouth daily.     Magnesium 250 MG TABS Take by mouth daily.     meloxicam (MOBIC) 15 MG tablet Take 15 mg by mouth daily.     metoprolol succinate (TOPROL-XL) 25 MG 24 hr tablet Take 25 mg by mouth daily.     metoprolol tartrate (LOPRESSOR) 100 MG tablet Take 1 tablet (100 mg total) by mouth once for 1 dose. Please take one time dose 100mg  metoprolol tartrate 2 hr prior to cardiac CT for HR control IF HR >55bpm. 1 tablet 0   montelukast (SINGULAIR) 10 MG tablet Take 10 mg by mouth at bedtime.     MOUNJARO 5 MG/0.5ML Pen Inject 5 mg into the skin once a week.     Multiple Vitamins-Minerals (ZINC PO) Take by mouth.     mupirocin ointment (BACTROBAN) 2 % Apply 1 Application topically 2 (two) times daily. Apply to wounds QD. 22 g 11   Naloxone HCl (EVZIO) 0.4 MG/0.4ML SOAJ Inject as directed.     nitroGLYCERIN (NITROSTAT) 0.4 MG SL tablet Place 1 tablet under the tongue every 5 (five) minutes as needed.  0   nitroGLYCERIN (NITROSTAT) 0.4 MG SL tablet Place under the tongue.     omeprazole (PRILOSEC) 40 MG capsule Take 40 mg by mouth 2 (two) times daily.     Oxycodone HCl 10 MG TABS Take 10 mg by mouth every 6 (six) hours.     oxyCODONE-acetaminophen (PERCOCET) 7.5-325 MG tablet Take 1-2 tablets by mouth every 6 (six) hours as needed for severe pain. 30 tablet 0   potassium chloride (KLOR-CON) 10 MEQ tablet Take 10 mEq by mouth daily.     predniSONE (DELTASONE) 10 MG tablet Label  & dispense according to the schedule below. 5 Pills PO for 1 day then, 4 Pills PO for 1 day, 3 Pills PO for 1 day, 2 Pills PO for 1 day, 1 Pill PO for 1 days then STOP. 15 tablet 0    predniSONE (DELTASONE) 50 MG tablet Take table by mouth 13 hours, 7 hours, and 1 hour prior to your cardiac CT scan. 3 tablet 0   pregabalin (LYRICA) 25 MG capsule Take  25 mg by mouth 2 (two) times daily.     roflumilast (DALIRESP) 500 MCG TABS tablet Take 500 mcg by mouth daily.     tiotropium (SPIRIVA) 18 MCG inhalation capsule Place 18 mcg into inhaler and inhale daily.     triamcinolone (KENALOG) 0.1 % paste Use as directed 1 application in the mouth or throat 2 (two) times daily.     TRULICITY 0.75 MG/0.5ML SOPN Inject 0.75 mg into the skin once a week.     vitamin B-12 (CYANOCOBALAMIN) 500 MCG tablet Take 500 mcg by mouth daily.     No current facility-administered medications for this visit.    OBJECTIVE: Vitals:   08/20/23 1530  BP: 129/67  Pulse: 100  Resp: 19  Temp: (!) 96.6 F (35.9 C)  SpO2: 94%     Body mass index is 35.7 kg/m.    ECOG FS:0 - Asymptomatic  General: Well-developed, well-nourished, no acute distress. Eyes: Pink conjunctiva, anicteric sclera. HEENT: Normocephalic, moist mucous membranes. Lungs: No audible wheezing or coughing. Heart: Regular rate and rhythm. Abdomen: Soft, nontender, no obvious distention. Musculoskeletal: No edema, cyanosis, or clubbing. Neuro: Alert, answering all questions appropriately. Cranial nerves grossly intact. Skin: No rashes or petechiae noted. Psych: Normal affect.  LAB RESULTS:  Lab Results  Component Value Date   NA 133 (L) 03/26/2023   K 3.3 (L) 03/26/2023   CL 94 (L) 03/26/2023   CO2 27 03/26/2023   GLUCOSE 153 (H) 03/26/2023   BUN 14 03/26/2023   CREATININE 1.25 (H) 03/26/2023   CALCIUM 8.4 (L) 03/26/2023   PROT 7.9 03/26/2023   ALBUMIN 3.1 (L) 03/26/2023   AST 14 (L) 03/26/2023   ALT 9 03/26/2023   ALKPHOS 97 03/26/2023   BILITOT 0.6 03/26/2023   GFRNONAA 47 (L) 03/26/2023   GFRAA 49 (L) 10/31/2019    Lab Results  Component Value Date   WBC 5.3 08/19/2023   NEUTROABS 3.2 08/19/2023   HGB 13.4  08/19/2023   HCT 43.7 08/19/2023   MCV 85.9 08/19/2023   PLT 216 08/19/2023   Lab Results  Component Value Date   FERRITIN 22 08/19/2023   Lab Results  Component Value Date   IRON 42 08/19/2023   TIBC 386 08/19/2023   IRONPCTSAT 11 08/19/2023      STUDIES: NM PET Image Initial (PI) Skull Base To Thigh Result Date: 08/18/2023 CLINICAL DATA:  Initial treatment strategy for lung nodule. EXAM: NUCLEAR MEDICINE PET SKULL BASE TO THIGH TECHNIQUE: 10.2 mCi F-18 FDG was injected intravenously. Full-ring PET imaging was performed from the skull base to thigh after the radiotracer. CT data was obtained and used for attenuation correction and anatomic localization. Fasting blood glucose: 108 mg/dl COMPARISON:  Chest CT 40/98/1191 FINDINGS: Mediastinal blood pool activity: SUV max 2.6 Liver activity: SUV max NA NECK: No significant abnormal hypermetabolic activity in this region. Incidental physiologic activity and regional shoulder musculature. Incidental CT findings: Left mastoid effusion. Left common carotid atheromatous vascular calcification. CHEST: Physiologic activity in the esophagus. Interval resolution of much of the atelectasis along the mild area of lingular scarring constituting the previous lesion of concern. No significant abnormal accentuated metabolic activity in this vicinity. There is some new bandlike atelectasis medially in the left upper lobe more cephalad than the previous region of concern. Low-grade activity along a posterior AP window lymph node measuring 0.8 cm in short axis on image 43 series 6, maximum SUV 4.2. Most likely inflammatory in this context. Incidental CT findings: Aortic and branch vessel  atherosclerosis. Mild cardiomegaly. Emphysema. ABDOMEN/PELVIS: No significant abnormal hypermetabolic activity in this region. Incidental CT findings: Atherosclerosis is present, including aortoiliac atherosclerotic disease. Clips along the central mesentery. Sigmoid colon  diverticulosis. SKELETON: No significant abnormal hypermetabolic activity in this region. Incidental CT findings: Degenerative glenohumeral arthropathy bilaterally. Bilateral hip prostheses. Spondylosis with mild grade 1 anterolisthesis at L3-4. Lower lumbar degenerative disc disease. IMPRESSION: 1. Interval resolution of much of the atelectasis along the mild area of lingular scarring constituting the previous lesion of concern. No significant abnormal accentuated metabolic activity in this vicinity. No hypermetabolic nodule. 2. New bandlike atelectasis medially in the left upper lobe more cephalad than the previous region of concern. 3. Low-grade activity along a posterior AP window lymph node measuring 0.8 cm in short axis, maximum SUV 4.2. Most likely inflammatory in this context. 4. Left mastoid effusion. 5. Aortic and branch vessel atherosclerosis. 6. Mild cardiomegaly. 7. Sigmoid colon diverticulosis. 8. Degenerative glenohumeral arthropathy bilaterally. 9. Spondylosis with mild grade 1 anterolisthesis at L3-4. Lower lumbar degenerative disc disease. 10. Bilateral hip prostheses. Aortic Atherosclerosis (ICD10-I70.0) and Emphysema (ICD10-J43.9). Electronically Signed   By: Gaylyn Rong M.D.   On: 08/18/2023 13:01    ASSESSMENT: Iron deficiency anemia.  PLAN:    Iron deficiency anemia: Patient's hemoglobin and iron stores are now within normal limits.  Her most recent colonoscopy and endoscopy on January 24, 2021 revealed multiple polyps, but no other significant pathology.  She was previously given a referral back to GI for consideration of additional luminal evaluation.  She does not require additional IV Feraheme today.  Patient last received treatment on June 19, 2023.  No intervention is needed.  Return to clinic in 4 months with repeat laboratory, further evaluation, and continuation of treatment if needed.  Pulmonary nodule: Resolved.  PET scan as above.   Patient expressed  understanding and was in agreement with this plan. She also understands that She can call clinic at any time with any questions, concerns, or complaints.    Jeralyn Ruths, MD   08/22/2023 10:04 AM

## 2023-08-22 ENCOUNTER — Encounter: Payer: Self-pay | Admitting: Oncology

## 2023-08-24 ENCOUNTER — Encounter: Payer: Self-pay | Admitting: Gastroenterology

## 2023-08-31 ENCOUNTER — Ambulatory Visit: Admission: RE | Admit: 2023-08-31 | Source: Home / Self Care | Admitting: Gastroenterology

## 2023-08-31 ENCOUNTER — Encounter: Admission: RE | Payer: Self-pay | Source: Home / Self Care

## 2023-08-31 HISTORY — DX: Hyperlipidemia, unspecified: E78.5

## 2023-08-31 HISTORY — DX: Carpal tunnel syndrome, bilateral upper limbs: G56.03

## 2023-08-31 HISTORY — DX: Anxiety disorder, unspecified: F41.9

## 2023-08-31 HISTORY — DX: Iron deficiency anemia, unspecified: D50.9

## 2023-08-31 HISTORY — DX: Atherosclerosis of aorta: I70.0

## 2023-08-31 HISTORY — DX: Schizophrenia, unspecified: F20.9

## 2023-08-31 HISTORY — DX: Spinal stenosis, lumbar region with neurogenic claudication: M48.062

## 2023-08-31 HISTORY — DX: Residual schizophrenia: F20.5

## 2023-08-31 SURGERY — COLONOSCOPY
Anesthesia: General

## 2023-09-07 ENCOUNTER — Other Ambulatory Visit: Payer: 59

## 2023-09-07 ENCOUNTER — Ambulatory Visit: Payer: 59 | Admitting: Oncology

## 2023-09-08 ENCOUNTER — Ambulatory Visit: Payer: 59

## 2023-09-08 ENCOUNTER — Ambulatory Visit: Payer: 59 | Admitting: Oncology

## 2023-09-08 ENCOUNTER — Ambulatory Visit

## 2023-09-08 ENCOUNTER — Ambulatory Visit: Admitting: Oncology

## 2023-09-16 ENCOUNTER — Ambulatory Visit: Attending: Otolaryngology

## 2023-09-16 DIAGNOSIS — G4761 Periodic limb movement disorder: Secondary | ICD-10-CM | POA: Insufficient documentation

## 2023-09-16 DIAGNOSIS — G4736 Sleep related hypoventilation in conditions classified elsewhere: Secondary | ICD-10-CM | POA: Insufficient documentation

## 2023-09-16 DIAGNOSIS — G4733 Obstructive sleep apnea (adult) (pediatric): Secondary | ICD-10-CM | POA: Diagnosis not present

## 2023-09-16 DIAGNOSIS — R5383 Other fatigue: Secondary | ICD-10-CM | POA: Diagnosis present

## 2023-09-28 ENCOUNTER — Ambulatory Visit: Admitting: Anesthesiology

## 2023-09-28 ENCOUNTER — Ambulatory Visit
Admission: RE | Admit: 2023-09-28 | Discharge: 2023-09-28 | Disposition: A | Discharge status: Home / Self Care | Attending: Gastroenterology | Admitting: Gastroenterology

## 2023-09-28 ENCOUNTER — Encounter
Admission: RE | Disposition: A | Discharge status: Home / Self Care | Payer: Self-pay | Source: Home / Self Care | Attending: Gastroenterology

## 2023-09-28 ENCOUNTER — Encounter: Payer: Self-pay | Admitting: Gastroenterology

## 2023-09-28 DIAGNOSIS — K635 Polyp of colon: Secondary | ICD-10-CM | POA: Diagnosis not present

## 2023-09-28 DIAGNOSIS — K7460 Unspecified cirrhosis of liver: Secondary | ICD-10-CM | POA: Insufficient documentation

## 2023-09-28 DIAGNOSIS — Z7984 Long term (current) use of oral hypoglycemic drugs: Secondary | ICD-10-CM | POA: Insufficient documentation

## 2023-09-28 DIAGNOSIS — D124 Benign neoplasm of descending colon: Secondary | ICD-10-CM | POA: Diagnosis not present

## 2023-09-28 DIAGNOSIS — K5730 Diverticulosis of large intestine without perforation or abscess without bleeding: Secondary | ICD-10-CM | POA: Diagnosis not present

## 2023-09-28 DIAGNOSIS — J449 Chronic obstructive pulmonary disease, unspecified: Secondary | ICD-10-CM | POA: Diagnosis not present

## 2023-09-28 DIAGNOSIS — E119 Type 2 diabetes mellitus without complications: Secondary | ICD-10-CM | POA: Insufficient documentation

## 2023-09-28 DIAGNOSIS — D120 Benign neoplasm of cecum: Secondary | ICD-10-CM | POA: Diagnosis not present

## 2023-09-28 DIAGNOSIS — D509 Iron deficiency anemia, unspecified: Secondary | ICD-10-CM | POA: Diagnosis present

## 2023-09-28 DIAGNOSIS — K5900 Constipation, unspecified: Secondary | ICD-10-CM | POA: Diagnosis not present

## 2023-09-28 DIAGNOSIS — I10 Essential (primary) hypertension: Secondary | ICD-10-CM | POA: Insufficient documentation

## 2023-09-28 DIAGNOSIS — Z96643 Presence of artificial hip joint, bilateral: Secondary | ICD-10-CM | POA: Diagnosis not present

## 2023-09-28 DIAGNOSIS — K621 Rectal polyp: Secondary | ICD-10-CM | POA: Insufficient documentation

## 2023-09-28 DIAGNOSIS — G4730 Sleep apnea, unspecified: Secondary | ICD-10-CM | POA: Insufficient documentation

## 2023-09-28 DIAGNOSIS — Z794 Long term (current) use of insulin: Secondary | ICD-10-CM | POA: Diagnosis not present

## 2023-09-28 DIAGNOSIS — Z9981 Dependence on supplemental oxygen: Secondary | ICD-10-CM | POA: Insufficient documentation

## 2023-09-28 DIAGNOSIS — Z7985 Long-term (current) use of injectable non-insulin antidiabetic drugs: Secondary | ICD-10-CM | POA: Insufficient documentation

## 2023-09-28 DIAGNOSIS — K219 Gastro-esophageal reflux disease without esophagitis: Secondary | ICD-10-CM | POA: Insufficient documentation

## 2023-09-28 HISTORY — PX: COLONOSCOPY: SHX5424

## 2023-09-28 HISTORY — PX: ESOPHAGOGASTRODUODENOSCOPY: SHX5428

## 2023-09-28 HISTORY — PX: POLYPECTOMY: SHX149

## 2023-09-28 SURGERY — COLONOSCOPY
Anesthesia: General

## 2023-09-28 MED ORDER — LIDOCAINE HCL (CARDIAC) PF 100 MG/5ML IV SOSY
PREFILLED_SYRINGE | INTRAVENOUS | Status: DC | PRN
  Administered 2023-09-28: 80 mg via INTRAVENOUS

## 2023-09-28 MED ORDER — EPHEDRINE 5 MG/ML INJ
INTRAVENOUS | Status: AC
  Filled 2023-09-28: qty 5

## 2023-09-28 MED ORDER — PHENYLEPHRINE 80 MCG/ML (10ML) SYRINGE FOR IV PUSH (FOR BLOOD PRESSURE SUPPORT)
PREFILLED_SYRINGE | INTRAVENOUS | Status: AC
  Filled 2023-09-28: qty 10

## 2023-09-28 MED ORDER — PROPOFOL 500 MG/50ML IV EMUL
INTRAVENOUS | Status: DC | PRN
  Administered 2023-09-28: 75 ug/kg/min via INTRAVENOUS

## 2023-09-28 MED ORDER — GLYCOPYRROLATE 0.2 MG/ML IJ SOLN
INTRAMUSCULAR | Status: AC
  Filled 2023-09-28: qty 1

## 2023-09-28 MED ORDER — PROPOFOL 1000 MG/100ML IV EMUL
INTRAVENOUS | Status: AC
  Filled 2023-09-28: qty 100

## 2023-09-28 MED ORDER — SODIUM CHLORIDE 0.9 % IV SOLN
INTRAVENOUS | Status: DC
Start: 2023-09-28 — End: 2023-09-28

## 2023-09-28 MED ORDER — GLYCOPYRROLATE 0.2 MG/ML IJ SOLN
INTRAMUSCULAR | Status: DC | PRN
  Administered 2023-09-28: .2 mg via INTRAVENOUS

## 2023-09-28 MED ORDER — DEXMEDETOMIDINE HCL IN NACL 80 MCG/20ML IV SOLN
INTRAVENOUS | Status: DC | PRN
  Administered 2023-09-28: 20 ug via INTRAVENOUS

## 2023-09-28 MED ORDER — LIDOCAINE HCL (PF) 2 % IJ SOLN
INTRAMUSCULAR | Status: AC
  Filled 2023-09-28: qty 5

## 2023-09-28 MED ORDER — PROPOFOL 10 MG/ML IV BOLUS
INTRAVENOUS | Status: DC | PRN
  Administered 2023-09-28: 50 mg via INTRAVENOUS

## 2023-09-28 NOTE — Op Note (Addendum)
 [facility] Patient Name: Deanna Gay Procedure Date: 09/28/2023 11:39 AM MRN: [id number] Account #: [id number] Date of Birth: Apr 29, 1956 Admit Type: Outpatient Age: 68 Room: [facility] ENDO ROOM 2 Gender: Female Note Status: Supervisor Override Instrument Name: Cristino Donna Endoscope [id number] Procedure:             Upper GI endoscopy Indications:           Iron  deficiency anemia, generalized postprandial                         abdominal pain Providers:             Bridgett Camps, DO Referring MD:          Huel Madison. Alva Jewels MD, MD (Referring MD) Medicines:             Monitored Anesthesia Care Complications:         No immediate complications. Estimated blood loss: None. Procedure:             Pre-Anesthesia Assessment:                        - Prior to the procedure, a History and Physical was                         performed, and patient medications and allergies were                         reviewed. The patient is competent. The risks and                         benefits of the procedure and the sedation options and                         risks were discussed with the patient. All questions                         were answered and informed consent was obtained.                         Patient identification and proposed procedure were                         verified by the physician, the nurse, the anesthetist                         and the technician in the endoscopy suite. Mental                         Status Examination: alert and oriented. Airway                         Examination: normal oropharyngeal airway and neck                         mobility. Respiratory Examination: clear to                         auscultation. CV Examination: RRR, no murmurs, no S3  or S4. Prophylactic Antibiotics: The patient does not                         require prophylactic antibiotics. Prior                          Anticoagulants: The patient has taken no anticoagulant                         or antiplatelet agents. ASA Grade Assessment: III - A                         patient with severe systemic disease. After reviewing                         the risks and benefits, the patient was deemed in                         satisfactory condition to undergo the procedure. The                         anesthesia plan was to use monitored anesthesia care                         (MAC). Immediately prior to administration of                         medications, the patient was re-assessed for adequacy                         to receive sedatives. The heart rate, respiratory                         rate, oxygen saturations, blood pressure, adequacy of                         pulmonary ventilation, and response to care were                         monitored throughout the procedure. The physical                         status of the patient was re-assessed after the                         procedure.                        After obtaining informed consent, the endoscope was                         passed under direct vision. Throughout the procedure,                         the patient's blood pressure, pulse, and oxygen                         saturations were monitored continuously. The Endoscope  was introduced through the mouth, and advanced to the                         third part of duodenum. The upper GI endoscopy was                         accomplished without difficulty. The patient tolerated                         the procedure well. Findings:      The duodenal bulb, first portion of the duodenum, second portion of the       duodenum and third portion of the duodenum were normal. Estimated blood       loss: none.      The entire examined stomach was normal. Estimated blood loss: none.      The Z-line was regular. Estimated blood loss: none.      Esophagogastric landmarks were  identified: the gastroesophageal junction       was found at 44 cm from the incisors.      The examined esophagus was normal. Estimated blood loss: none.      No signs of sequlae of portal hypertension. Impression:            - Normal duodenal bulb, first portion of the duodenum,                         second portion of the duodenum and third portion of                         the duodenum.                        - Normal stomach.                        - Z-line regular.                        - Esophagogastric landmarks identified.                        - Normal esophagus.                        - No specimens collected. Recommendation:        - Patient has a contact number available for                         emergencies. The signs and symptoms of potential                         delayed complications were discussed with the patient.                         Return to normal activities tomorrow. Written                         discharge instructions were provided to the patient.                        - Discharge patient  to home.                        - Resume previous diet.                        - Continue present medications.                        - Return to GI clinic as previously scheduled.                        - proceed with colonoscopy. see report for further                         recommendations.                        - The findings and recommendations were discussed with                         the patient. Procedure Code(s):     --- Professional ---                        [phone], Esophagogastroduodenoscopy, flexible,                         transoral; diagnostic, including collection of                         specimen(s) by brushing or washing, when performed                         (separate procedure) Diagnosis Code(s):     --- Professional ---                        D50.9, Iron  deficiency anemia, unspecified CPT copyright 2022 American Medical Association.  All rights reserved. The codes documented in this report are preliminary and upon coder review may  be revised to meet current compliance requirements. Attending Participation:      I personally performed the entire procedure. Polo Brisk, DO Quintin Buckle DO, DO 09/28/2023 11:58:00 AM This report has been signed electronically. Number of Addenda: 0 Note Initiated On: 09/28/2023 11:39 AM Estimated Blood Loss:  Estimated blood loss: none.      [facility]

## 2023-09-28 NOTE — Transfer of Care (Signed)
 Immediate Anesthesia Transfer of Care Note  Patient: Deanna Gay  Procedure(s) Performed: COLONOSCOPY EGD (ESOPHAGOGASTRODUODENOSCOPY) POLYPECTOMY, INTESTINE  Patient Location: PACU  Anesthesia Type:General  Level of Consciousness: sedated  Airway & Oxygen Therapy: Patient Spontanous Breathing  Post-op Assessment: Report given to RN and Post -op Vital signs reviewed and stable  Post vital signs: Reviewed and stable  Last Vitals:  Vitals Value Taken Time  BP    Temp    Pulse 90 09/28/23 1227  Resp 27 09/28/23 1227  SpO2 100 % 09/28/23 1227  Vitals shown include unfiled device data.  Last Pain:  Vitals:   09/28/23 0958  TempSrc: Temporal  PainSc: 0-No pain         Complications: No notable events documented.

## 2023-09-28 NOTE — Op Note (Signed)
 [facility] Gastroenterology Patient Name: Deanna Gay Procedure Date: 09/28/2023 11:38 AM MRN: [id number] Account #: [id number] Date of Birth: Apr 02, 1956 Admit Type: Outpatient Age: 68 Room: [facility] ENDO ROOM 2 Gender: Female Note Status: Supervisor Override Instrument Name: Charlyn Cooley [id number] Procedure:             Colonoscopy Indications:           Personal history of colonic polyps, Iron  deficiency                         anemia Providers:             Bridgett Camps, DO Referring MD:          Huel Madison. Alva Jewels MD, MD (Referring MD) Medicines:             Monitored Anesthesia Care Complications:         No immediate complications. Estimated blood loss:                         Minimal. Procedure:             Pre-Anesthesia Assessment:                        - Prior to the procedure, a History and Physical was                         performed, and patient medications and allergies were                         reviewed. The patient is competent. The risks and                         benefits of the procedure and the sedation options and                         risks were discussed with the patient. All questions                         were answered and informed consent was obtained.                         Patient identification and proposed procedure were                         verified by the physician, the nurse, the anesthetist                         and the technician in the endoscopy suite. Mental                         Status Examination: alert and oriented. Airway                         Examination: normal oropharyngeal airway and neck                         mobility. Respiratory Examination: clear to  auscultation. CV Examination: RRR, no murmurs, no S3                         or S4. Prophylactic Antibiotics: The patient does not                         require prophylactic antibiotics. Prior                          Anticoagulants: The patient has taken no anticoagulant                         or antiplatelet agents. ASA Grade Assessment: III - A                         patient with severe systemic disease. After reviewing                         the risks and benefits, the patient was deemed in                         satisfactory condition to undergo the procedure. The                         anesthesia plan was to use monitored anesthesia care                         (MAC). Immediately prior to administration of                         medications, the patient was re-assessed for adequacy                         to receive sedatives. The heart rate, respiratory                         rate, oxygen saturations, blood pressure, adequacy of                         pulmonary ventilation, and response to care were                         monitored throughout the procedure. The physical                         status of the patient was re-assessed after the                         procedure.                        After obtaining informed consent, the colonoscope was                         passed under direct vision. Throughout the procedure,                         the patient's blood pressure, pulse, and oxygen  saturations were monitored continuously. The                         Colonoscope was introduced through the anus and                         advanced to the the cecum, identified by appendiceal                         orifice and ileocecal valve. The colonoscopy was                         performed without difficulty. The patient tolerated                         the procedure well. The quality of the bowel                         preparation was evaluated using the BBPS [facility] Bowel                         Preparation Scale) with scores of: Right Colon = 3                         (entire mucosa seen well with no residual staining,                         small  fragments of stool or opaque liquid), Transverse                         Colon = 3 (entire mucosa seen well with no residual                         staining, small fragments of stool or opaque liquid)                         and Left Colon = 2 (minor amount of residual staining,                         small fragments of stool and/or opaque liquid, but                         mucosa seen well). The total BBPS score equals 8. The                         quality of the bowel preparation was excellent. The                         ileocecal valve, appendiceal orifice, and rectum were                         photographed. Findings:      The perianal and digital rectal examinations were normal. Pertinent       negatives include normal sphincter tone.      Multiple small-mouthed diverticula were found in the left colon.       Estimated blood loss: none.  Five sessile polyps were found in the rectum (1), sigmoid colon (2) and       cecum (2). The polyps were 1 to 2 mm in size. These polyps were removed       with a jumbo cold forceps. Resection and retrieval were complete.       Estimated blood loss was minimal.      A 4 to 5 mm polyp was found in the descending colon. The polyp was       sessile. The polyp was removed with a cold snare. Resection and       retrieval were complete. Estimated blood loss was minimal.      The exam was otherwise without abnormality on direct and retroflexion       views. Impression:            - Diverticulosis in the left colon.                        - Five 1 to 2 mm polyps in the rectum, in the sigmoid                         colon and in the cecum, removed with a jumbo cold                         forceps. Resected and retrieved.                        - One 4 to 5 mm polyp in the descending colon, removed                         with a cold snare. Resected and retrieved.                        - The examination was otherwise normal on direct and                          retroflexion views. Recommendation:        - Patient has a contact number available for                         emergencies. The signs and symptoms of potential                         delayed complications were discussed with the patient.                         Return to normal activities tomorrow. Written                         discharge instructions were provided to the patient.                        - Discharge patient to home.                        - Resume previous diet.                        - Continue present medications.                        -  No ibuprofen, naproxen, or other non-steroidal                         anti-inflammatory drugs for 5 days after polyp removal.                        - Await pathology results.                        - Repeat colonoscopy for surveillance based on                         pathology results.                        - Return to referring physician as previously                         scheduled.                        - The findings and recommendations were discussed with                         the patient. Procedure Code(s):     --- Professional ---                        [phone], Colonoscopy, flexible; with removal of                         tumor(s), polyp(s), or other lesion(s) by snare                         technique                        [id number], 59, Colonoscopy, flexible; with biopsy, single                         or multiple Diagnosis Code(s):     --- Professional ---                        D12.8, Benign neoplasm of rectum                        D12.5, Benign neoplasm of sigmoid colon                        D12.0, Benign neoplasm of cecum                        D12.4, Benign neoplasm of descending colon                        Z86.010, Personal history of colonic polyps                        D50.9, Iron  deficiency anemia, unspecified                        K57.30, Diverticulosis of large intestine without  perforation or abscess without bleeding CPT copyright 2022 American [employer] Association. All rights reserved. The codes documented in this report are preliminary and upon coder review may  be revised to meet current compliance requirements. Attending Participation:      I personally performed the entire procedure. Polo Brisk, DO Quintin Buckle DO, DO 09/28/2023 29:56:21 PM This report has been signed electronically. Number of Addenda: 0 Note Initiated On: 09/28/2023 11:38 AM Scope Withdrawal Time: 0 hours 15 minutes 34 seconds  Total Procedure Duration: 0 hours 22 minutes 9 seconds  Estimated Blood Loss:  Estimated blood loss was minimal.      [facility]

## 2023-09-28 NOTE — Interval H&P Note (Signed)
 History and Physical Interval Note: Preprocedure H&P from 09/28/23  was reviewed and there was no interval change after seeing and examining the patient.  Written consent was obtained from the patient after discussion of risks, benefits, and alternatives. Patient has consented to proceed with Esophagogastroduodenoscopy and Colonoscopy with possible intervention   09/28/2023 10:32 AM  Deanna Gay  has presented today for surgery, with the diagnosis of Iron  deficiency anemia due to chronic blood loss (D50.0) Generalized postprandial abdominal pain (R10.84) Drug-induced constipation (K59.03) Early satiety (R68.81).  The various methods of treatment have been discussed with the patient and family. After consideration of risks, benefits and other options for treatment, the patient has consented to  Procedure(s): COLONOSCOPY (N/A) EGD (ESOPHAGOGASTRODUODENOSCOPY) (N/A) as a surgical intervention.  The patient's history has been reviewed, patient examined, no change in status, stable for surgery.  I have reviewed the patient's chart and labs.  Questions were answered to the patient's satisfaction.     Quintin Buckle

## 2023-09-28 NOTE — Anesthesia Postprocedure Evaluation (Signed)
 Anesthesia Post Note  Patient: Deanna Gay  Procedure(s) Performed: COLONOSCOPY EGD (ESOPHAGOGASTRODUODENOSCOPY) POLYPECTOMY, INTESTINE  Patient location during evaluation: PACU Anesthesia Type: General Level of consciousness: awake and alert, oriented and patient cooperative Pain management: pain level controlled Vital Signs Assessment: post-procedure vital signs reviewed and stable Respiratory status: spontaneous breathing, nonlabored ventilation and respiratory function stable Cardiovascular status: blood pressure returned to baseline and stable Postop Assessment: adequate PO intake Anesthetic complications: no   No notable events documented.   Last Vitals:  Vitals:   09/28/23 1228 09/28/23 1241  BP: 122/80 135/79  Pulse: 90 94  Resp: 20 20  Temp:    SpO2: 100% 100%    Last Pain:  Vitals:   09/28/23 1241  TempSrc:   PainSc: 0-No pain                 Dorothey Gate

## 2023-09-28 NOTE — Anesthesia Preprocedure Evaluation (Signed)
 Anesthesia Evaluation  Patient identified by MRN, date of birth, ID band Patient awake    Reviewed: Allergy & Precautions, NPO status , Patient's Chart, lab work & pertinent test results  History of Anesthesia Complications Negative for: history of anesthetic complications  Airway Mallampati: II  TM Distance: >3 FB Neck ROM: Full    Dental no notable dental hx. (+) Teeth Intact   Pulmonary sleep apnea , COPD,  COPD inhaler and oxygen dependent, Current SmokerPatient did not abstain from smoking. 2L/min [place] at night   Pulmonary exam normal breath sounds clear to auscultation       Cardiovascular Exercise Tolerance: Good METShypertension, Pt. on medications (-) CAD and (-) Past MI (-) dysrhythmias  Rhythm:Regular Rate:Normal - Systolic murmurs    Neuro/Psych  PSYCHIATRIC DISORDERS Anxiety Depression  Schizophrenia  negative neurological ROS     GI/Hepatic ,GERD  ,,(+)     (-) substance abuse    Endo/Other  diabetes  GLP1 agonist last taken 8 days ago. Denies nausea/vomiting today  Renal/GU negative Renal ROS     Musculoskeletal   Abdominal  (+) + obese  Peds  Hematology   Anesthesia Other Findings Past Medical History: No date: Anemia No date: Anxiety No date: Aortic atherosclerosis (HCC) No date: Arthritis No date: Bilateral carpal tunnel syndrome No date: Cancer [facility])     Comment:  cervical No date: Chronic undifferentiated schizophrenia (HCC) No date: COPD (chronic obstructive pulmonary disease) (HCC) 07/19/2017: COPD exacerbation (HCC) No date: Depression No date: Diabetes mellitus without complication (HCC) No date: GERD (gastroesophageal reflux disease) No date: Hyperlipidemia No date: Hypertension No date: Iron  deficiency anemia No date: Schizophrenia (HCC) No date: Sleep apnea No date: Spinal stenosis of lumbar region with neurogenic claudication  Reproductive/Obstetrics                              Anesthesia Physical Anesthesia Plan  ASA: 3  Anesthesia Plan: General   Post-op Pain Management: Minimal or no pain anticipated   Induction: Intravenous  PONV Risk Score and Plan: 2 and Propofol  infusion, TIVA and Ondansetron   Airway Management Planned: Nasal Cannula  Additional Equipment: None  Intra-op Plan:   Post-operative Plan:   Informed Consent: I have reviewed the patients History and Physical, chart, labs and discussed the procedure including the risks, benefits and alternatives for the proposed anesthesia with the patient or authorized representative who has indicated his/her understanding and acceptance.     Dental advisory given  Plan Discussed with: CRNA and Surgeon  Anesthesia Plan Comments: (Patient drank her prep up until 0925. I informed patient that due to aspiration prevention and NPO guidelines , her anesthetic cannot be initiated until 1125. She was visibly upset at having to wait longer, but understood.  Discussed risks of anesthesia with patient, including possibility of difficulty with spontaneous ventilation under anesthesia necessitating airway intervention, PONV, and rare risks such as cardiac or respiratory or neurological events, and allergic reactions. Discussed the role of CRNA in patient's perioperative care. Patient understands.  Patient counseled on benefits of smoking cessation, and increased perioperative risks associated with continued smoking. )       Anesthesia Quick Evaluation

## 2023-09-28 NOTE — H&P (Signed)
 Pre-Procedure H&P   Patient ID: Deanna Gay is a 68 y.o. female.  Gastroenterology Provider: Quintin Buckle, DO  Referring Provider: Jamee Mazzoni, PA PCP: Jimmy Moulding, MD  Date: 09/28/2023  HPI Deanna Gay is a 68 y.o. female who presents today for Esophagogastroduodenoscopy and Colonoscopy for Iron  deficiency anemia, postprandial abdominal pain, constipation, cirrhosis.  Patient's last EGD and colonoscopy was in September 2022.  She was noted to have no findings of portal hypertension.  She did have a hiatal hernia and gastritis.  4 sessile serrated polyps and 1 TVA polyp removed including one 10 mm or larger.  She also was found to have internal hemorrhoids and left sided diverticulosis.  Status post bilateral hip replacements  Patient had iron  deficiency with hemoglobin as low as 8.6 and ferritin at 5.  This all improved with iron  infusion therapy at 13.4 and 22 respectively. Cr 1.3  No melena or hematochezia.  Complains of fatigue and postprandial abdominal discomfort.  She is notably on Mounjaro and experiences early satiety. History of MASH cirrhosis  Past Medical History:  Diagnosis Date   Anemia    Anxiety    Aortic atherosclerosis (HCC)    Arthritis    Bilateral carpal tunnel syndrome    Cancer (HCC)    cervical   Chronic undifferentiated schizophrenia (HCC)    COPD (chronic obstructive pulmonary disease) (HCC)    COPD exacerbation (HCC) 07/19/2017   Depression    Diabetes mellitus without complication (HCC)    GERD (gastroesophageal reflux disease)    Hyperlipidemia    Hypertension    Iron  deficiency anemia    Schizophrenia (HCC)    Sleep apnea    Spinal stenosis of lumbar region with neurogenic claudication     Past Surgical History:  Procedure Laterality Date   APPENDECTOMY     COLONOSCOPY N/A 01/24/2021   Procedure: COLONOSCOPY;  Surgeon: Quintin Buckle, DO;  Location: [facility] ENDOSCOPY;  Service: Gastroenterology;   Laterality: N/A;   COLONOSCOPY WITH PROPOFOL  N/A 12/25/2014   Procedure: COLONOSCOPY WITH PROPOFOL ;  Surgeon: Luella Sager, MD;  Location: [facility] ENDOSCOPY;  Service: Endoscopy;  Laterality: N/A;   COLONOSCOPY WITH PROPOFOL  N/A 01/21/2018   Procedure: COLONOSCOPY WITH PROPOFOL ;  Surgeon: Selena Daily, MD;  Location: [facility] ENDOSCOPY;  Service: Gastroenterology;  Laterality: N/A;   ESOPHAGOGASTRODUODENOSCOPY N/A 01/24/2021   Procedure: ESOPHAGOGASTRODUODENOSCOPY (EGD);  Surgeon: Quintin Buckle, DO;  Location: [facility] ENDOSCOPY;  Service: Gastroenterology;  Laterality: N/A;   ESOPHAGOGASTRODUODENOSCOPY (EGD) WITH PROPOFOL  N/A 12/25/2014   Procedure: ESOPHAGOGASTRODUODENOSCOPY (EGD) WITH PROPOFOL ;  Surgeon: Luella Sager, MD;  Location: [facility] ENDOSCOPY;  Service: Endoscopy;  Laterality: N/A;   ESOPHAGOGASTRODUODENOSCOPY (EGD) WITH PROPOFOL  N/A 01/21/2018   Procedure: ESOPHAGOGASTRODUODENOSCOPY (EGD) WITH PROPOFOL ;  Surgeon: Selena Daily, MD;  Location: [facility] ENDOSCOPY;  Service: Gastroenterology;  Laterality: N/A;   GIVENS CAPSULE STUDY N/A 03/02/2018   Procedure: GIVENS CAPSULE STUDY;  Surgeon: Selena Daily, MD;  Location: [facility] ENDOSCOPY;  Service: Gastroenterology;  Laterality: N/A;   JOINT REPLACEMENT     right hip   TOTAL HIP ARTHROPLASTY Left 05/16/2016   Procedure: LEFT TOTAL HIP ARTHROPLASTY ANTERIOR APPROACH;  Surgeon: Arnie Lao, MD;  Location: [facility] ORS;  Service: Orthopedics;  Laterality: Left;    Family History No h/o GI disease or malignancy  Review of Systems  Constitutional:  Positive for fatigue. Negative for activity change, appetite change, chills, diaphoresis, fever and unexpected weight change.  HENT:  Negative for trouble swallowing  and voice change.   Respiratory:  Negative for shortness of breath and wheezing.   Cardiovascular:  Negative for chest pain, palpitations and leg swelling.  Gastrointestinal:  Positive for abdominal pain (post  prandial). Negative for abdominal distention, anal bleeding, blood in stool, constipation, diarrhea, nausea, rectal pain and vomiting.  Musculoskeletal:  Negative for arthralgias and myalgias.  Skin:  Negative for color change and pallor.  Neurological:  Negative for dizziness, syncope and weakness.  Psychiatric/Behavioral:  Negative for confusion.   All other systems reviewed and are negative.    Medications No current facility-administered medications on file prior to encounter.   Current Outpatient Medications on File Prior to Encounter  Medication Sig Dispense Refill   ACCU-CHEK AVIVA PLUS test strip use 1 TEST STRIP to TEST BLOOD SUGAR four times a day  0   atorvastatin (LIPITOR) 40 MG tablet Take 40 mg by mouth daily.     BANOPHEN 25 MG capsule SMARTSIG:2 Capsule(s) By Mouth     Blood Glucose Monitoring Suppl (ACCU-CHEK AVIVA PLUS) w/Device KIT See admin instructions.  0   budesonide -formoterol  (SYMBICORT ) 160-4.5 MCG/ACT inhaler Inhale 2 puffs into the lungs 2 (two) times daily.     Calcium  Carb-Cholecalciferol  600-400 MG-UNIT TABS Take 1 tablet by mouth daily.     calcium  carbonate (OSCAL) 1500 (600 Ca) MG TABS tablet Take by mouth 2 (two) times daily with a meal.     celecoxib (CELEBREX) 200 MG capsule Take 200 mg by mouth every 12 (twelve) hours.     cyclobenzaprine  (FLEXERIL ) 10 MG tablet Take 10 mg by mouth at bedtime.     diltiazem  (CARDIZEM  CD) 180 MG 24 hr capsule Take 180 mg by mouth at bedtime.      diltiazem  (TIAZAC ) 180 MG 24 hr capsule Take by mouth.     docusate sodium  (COLACE) 100 MG capsule Take 100 mg by mouth 2 (two) times daily.     fluticasone  (FLONASE) 50 MCG/ACT nasal spray Place 2 sprays into both nostrils daily.     furosemide  (LASIX ) 20 MG tablet Take 20 mg by mouth daily.     hydrochlorothiazide  (HYDRODIURIL ) 25 MG tablet take 1 tablet by mouth every morning     insulin  glargine (LANTUS ) 100 UNIT/ML injection Inject 0.1 mLs (10 Units total) into the skin  at bedtime. 10 mL 11   Insulin  Syringe-Needle U-100 31G X 5/16" 0.5 ML MISC use as directed once daily     iron  polysaccharides (NIFEREX) 150 MG capsule Take 1 capsule (150 mg total) by mouth 2 (two) times daily. 90 capsule 3   JARDIANCE 10 MG TABS tablet Take 10 mg by mouth daily.     losartan  (COZAAR ) 50 MG tablet Take 50 mg by mouth daily.     Magnesium  250 MG TABS Take by mouth daily.     meloxicam  (MOBIC ) 15 MG tablet Take 15 mg by mouth daily.     metoprolol  succinate (TOPROL -XL) 25 MG 24 hr tablet Take 25 mg by mouth daily.     montelukast (SINGULAIR) 10 MG tablet Take 10 mg by mouth at bedtime.     Multiple Vitamins-Minerals (ZINC  PO) Take by mouth.     mupirocin  ointment (BACTROBAN ) 2 % Apply 1 Application topically 2 (two) times daily. Apply to wounds QD. 22 g 11   omeprazole (PRILOSEC) 40 MG capsule Take 40 mg by mouth 2 (two) times daily.     Oxycodone  HCl 10 MG TABS Take 10 mg by mouth every 6 (six) hours.  potassium chloride  (KLOR-CON ) 10 MEQ tablet Take 10 mEq by mouth daily.     pregabalin  (LYRICA ) 25 MG capsule Take 25 mg by mouth 2 (two) times daily.     roflumilast (DALIRESP) 500 MCG TABS tablet Take 500 mcg by mouth daily.     tiotropium (SPIRIVA) 18 MCG inhalation capsule Place 18 mcg into inhaler and inhale daily.     triamcinolone  (KENALOG ) 0.1 % paste Use as directed 1 application in the mouth or throat 2 (two) times daily.     vitamin B-12 (CYANOCOBALAMIN) 500 MCG tablet Take 500 mcg by mouth daily.     albuterol  (PROVENTIL  HFA;VENTOLIN  HFA) 108 (90 BASE) MCG/ACT inhaler Inhale 2 puffs into the lungs every 4 (four) hours as needed for wheezing or shortness of breath.      diltiazem  (CARDIZEM ) 60 MG tablet Take tablet by mouth TWO hours prior to your cardiac CT scan. (Patient not taking: Reported on 09/28/2023) 1 tablet 0   escitalopram  (LEXAPRO ) 10 MG tablet Take 10 mg by mouth daily. (Patient not taking: Reported on 09/28/2023)     fexofenadine (ALLEGRA) 180 MG tablet  Take 180 mg by mouth daily. (Patient not taking: Reported on 09/28/2023)     insulin  lispro (HUMALOG ) 100 UNIT/ML injection Please check your sugars before each meal and use lispro per the below sliding scale:  For sugars 151-200: take 2 units For 201-250: take 4units For 251-300: take 6 units For 301-350: take 8 units For 351-400: take 10 units For > 401: call MD (Patient not taking: Reported on 09/28/2023) 10 mL 11   ipratropium-albuterol  (DUONEB) 0.5-2.5 (3) MG/3ML SOLN Take 3 mLs by nebulization every 6 (six) hours as needed.     metoprolol  tartrate (LOPRESSOR ) 100 MG tablet Take 1 tablet (100 mg total) by mouth once for 1 dose. Please take one time dose 100mg  metoprolol  tartrate 2 hr prior to cardiac CT for HR control IF HR >55bpm. 1 tablet 0   MOUNJARO 5 MG/0.5ML Pen Inject 5 mg into the skin once a week.     Naloxone HCl (EVZIO) 0.4 MG/0.4ML SOAJ Inject as directed.     nitroGLYCERIN  (NITROSTAT ) 0.4 MG SL tablet Place 1 tablet under the tongue every 5 (five) minutes as needed.  0   nitroGLYCERIN  (NITROSTAT ) 0.4 MG SL tablet Place under the tongue.     oxyCODONE -acetaminophen  (PERCOCET) 7.5-325 MG tablet Take 1-2 tablets by mouth every 6 (six) hours as needed for severe pain. 30 tablet 0   predniSONE  (DELTASONE ) 10 MG tablet Label  & dispense according to the schedule below. 5 Pills PO for 1 day then, 4 Pills PO for 1 day, 3 Pills PO for 1 day, 2 Pills PO for 1 day, 1 Pill PO for 1 days then STOP. (Patient not taking: Reported on 09/28/2023) 15 tablet 0   predniSONE  (DELTASONE ) 50 MG tablet Take table by mouth 13 hours, 7 hours, and 1 hour prior to your cardiac CT scan. 3 tablet 0   TRULICITY 0.75 MG/0.5ML SOPN Inject 0.75 mg into the skin once a week. (Patient not taking: Reported on 09/28/2023)      Pertinent medications related to GI and procedure were reviewed by me with the patient prior to the procedure   Current Facility-Administered Medications:    0.9 %  sodium chloride  infusion,  , Intravenous, Continuous, Quintin Buckle, DO  sodium chloride          Allergies  Allergen Reactions   Penicillins Swelling    Has patient  had a PCN reaction causing immediate rash, facial/tongue/throat swelling, SOB or lightheadedness with hypotension: Yes Has patient had a PCN reaction causing severe rash involving mucus membranes or skin necrosis: Yes Has patient had a PCN reaction that required hospitalization: No Has patient had a PCN reaction occurring within the last 10 years: No If all of the above answers are "NO", then may proceed with Cephalosporin use.  Has patient had a PCN reaction causing immediate rash, facial/tongue/throat swelling, SOB or lightheadedness with hypotension: Yes Has patient had a PCN reaction causing severe rash involving mucus membranes or skin necrosis: Yes Has patient had a PCN reaction that required hospitalization: No Has patient had a PCN reaction occurring within the last 10 years: No If all of the above answers are "NO", then may proceed with Cephalosporin use. Has patient had a PCN reaction causing immediate rash, faci   Aspirin  Other (See Comments)    Ongoing anemia   Iodinated Contrast Media Swelling   Iodine Swelling   Allergies were reviewed by me prior to the procedure  Objective   Body mass index is 36.73 kg/m. Vitals:   09/28/23 0958  BP: (!) 151/78  Pulse: 73  Resp: 18  Temp: (!) 96.5 F (35.8 C)  TempSrc: Temporal  SpO2: 94%  Weight: 97.1 kg  Height: 5\' 4"  (1.626 m)     Physical Exam Vitals and nursing note reviewed.  Constitutional:      General: She is not in acute distress.    Appearance: Normal appearance. She is obese. She is not ill-appearing, toxic-appearing or diaphoretic.  HENT:     Head: Normocephalic and atraumatic.     Nose: Nose normal.     Mouth/Throat:     Mouth: Mucous membranes are moist.     Pharynx: Oropharynx is clear.  Eyes:     General: No scleral icterus.    Extraocular Movements:  Extraocular movements intact.  Cardiovascular:     Rate and Rhythm: Normal rate and regular rhythm.     Heart sounds: Normal heart sounds. No murmur heard.    No friction rub. No gallop.  Pulmonary:     Effort: Pulmonary effort is normal. No respiratory distress.     Breath sounds: Normal breath sounds. No wheezing, rhonchi or rales.  Abdominal:     General: Bowel sounds are normal. There is no distension.     Palpations: Abdomen is soft.     Tenderness: There is no abdominal tenderness. There is no guarding or rebound.  Musculoskeletal:     Cervical back: Neck supple.     Right lower leg: No edema.     Left lower leg: No edema.  Skin:    General: Skin is warm and dry.     Coloration: Skin is not jaundiced or pale.  Neurological:     General: No focal deficit present.     Mental Status: She is alert and oriented to person, place, and time. Mental status is at baseline.  Psychiatric:        Mood and Affect: Mood normal.        Behavior: Behavior normal.        Thought Content: Thought content normal.        Judgment: Judgment normal.      Assessment:  Deanna Gay is a 68 y.o. female  who presents today for Esophagogastroduodenoscopy and Colonoscopy for Iron  deficiency anemia, postprandial abdominal pain, constipation, cirrhosis.  Plan:  Esophagogastroduodenoscopy and Colonoscopy with possible intervention today  Esophagogastroduodenoscopy and Colonoscopy with possible biopsy, control of bleeding, polypectomy, and interventions as necessary has been discussed with the patient/patient representative. Informed consent was obtained from the patient/patient representative after explaining the indication, nature, and risks of the procedure including but not limited to death, bleeding, perforation, missed neoplasm/lesions, cardiorespiratory compromise, and reaction to medications. Opportunity for questions was given and appropriate answers were provided. Patient/patient  representative has verbalized understanding is amenable to undergoing the procedure.   Quintin Buckle, DO  [facility] Gastroenterology  Portions of the record may have been created with voice recognition software. Occasional wrong-word or 'sound-a-like' substitutions may have occurred due to the inherent limitations of voice recognition software.  Read the chart carefully and recognize, using context, where substitutions may have occurred.

## 2023-09-29 LAB — SURGICAL PATHOLOGY

## 2023-11-13 DIAGNOSIS — F17210 Nicotine dependence, cigarettes, uncomplicated: Secondary | ICD-10-CM | POA: Insufficient documentation

## 2023-12-08 NOTE — Congregational Nurse Program (Signed)
  Dept: [phone]   Congregational Nurse Program Note  Date of Encounter: 12/08/2023  Past Medical History: Past Medical History:  Diagnosis Date   Anemia    Anxiety    Aortic atherosclerosis (HCC)    Arthritis    Bilateral carpal tunnel syndrome    Cancer (HCC)    cervical   Chronic undifferentiated schizophrenia (HCC)    COPD (chronic obstructive pulmonary disease) (HCC)    COPD exacerbation (HCC) 07/19/2017   Depression    Diabetes mellitus without complication (HCC)    GERD (gastroesophageal reflux disease)    Hyperlipidemia    Hypertension    Iron  deficiency anemia    Schizophrenia (HCC)    Sleep apnea    Spinal stenosis of lumbar region with neurogenic claudication     Encounter Details:  Community Questionnaire - 12/08/23 0957       Questionnaire   Ask client: Do you give verbal consent for me to treat you today? Yes    Student Assistance N/A    Location Patient Served  [facility]    Encounter Setting CN site    Population Status Unknown    Insurance Medicaid    Insurance/Financial Assistance Referral N/A    Medication N/A    Medical Provider Yes    Screening Referrals Made N/A    Medical Referrals Made N/A    Medical Appointment Completed N/A    CNP Interventions Advocate/Support    Screenings CN Performed N/A    ED Visit Averted N/A    Life-Saving Intervention Made N/A          Provided large bandage for skin wound on back, in order for bra to keep from rubbing the scab off.  Patient would like help in scheduling a dermatologist to look at the wound.

## 2023-12-15 ENCOUNTER — Other Ambulatory Visit: Payer: Self-pay | Admitting: Internal Medicine

## 2023-12-15 DIAGNOSIS — Z1231 Encounter for screening mammogram for malignant neoplasm of breast: Secondary | ICD-10-CM

## 2023-12-18 ENCOUNTER — Other Ambulatory Visit: Payer: Self-pay | Admitting: *Deleted

## 2023-12-18 DIAGNOSIS — D509 Iron deficiency anemia, unspecified: Secondary | ICD-10-CM

## 2023-12-21 ENCOUNTER — Inpatient Hospital Stay

## 2023-12-22 ENCOUNTER — Inpatient Hospital Stay: Admitting: Oncology

## 2023-12-22 ENCOUNTER — Inpatient Hospital Stay

## 2024-01-05 ENCOUNTER — Ambulatory Visit
Admission: RE | Admit: 2024-01-05 | Discharge: 2024-01-05 | Disposition: A | Discharge status: Home / Self Care | Source: Ambulatory Visit | Attending: Internal Medicine | Admitting: Internal Medicine

## 2024-01-05 DIAGNOSIS — Z1231 Encounter for screening mammogram for malignant neoplasm of breast: Secondary | ICD-10-CM | POA: Diagnosis present

## 2024-01-12 ENCOUNTER — Inpatient Hospital Stay: Attending: Oncology

## 2024-01-12 DIAGNOSIS — F17210 Nicotine dependence, cigarettes, uncomplicated: Secondary | ICD-10-CM | POA: Diagnosis not present

## 2024-01-12 DIAGNOSIS — D509 Iron deficiency anemia, unspecified: Secondary | ICD-10-CM | POA: Insufficient documentation

## 2024-01-12 DIAGNOSIS — G8929 Other chronic pain: Secondary | ICD-10-CM | POA: Insufficient documentation

## 2024-01-12 LAB — CBC WITH DIFFERENTIAL/PLATELET
Abs Immature Granulocytes: 0.01 K/uL (ref 0.00–0.07)
Basophils Absolute: 0.1 K/uL (ref 0.0–0.1)
Basophils Relative: 1 %
Eosinophils Absolute: 0.1 K/uL (ref 0.0–0.5)
Eosinophils Relative: 1 %
HCT: 39.7 % (ref 36.0–46.0)
Hemoglobin: 12.5 g/dL (ref 12.0–15.0)
Immature Granulocytes: 0 %
Lymphocytes Relative: 26 %
Lymphs Abs: 1.4 K/uL (ref 0.7–4.0)
MCH: 26.8 pg (ref 26.0–34.0)
MCHC: 31.5 g/dL (ref 30.0–36.0)
MCV: 85 fL (ref 80.0–100.0)
Monocytes Absolute: 0.5 K/uL (ref 0.1–1.0)
Monocytes Relative: 9 %
Neutro Abs: 3.3 K/uL (ref 1.7–7.7)
Neutrophils Relative %: 63 %
Platelets: 238 K/uL (ref 150–400)
RBC: 4.67 MIL/uL (ref 3.87–5.11)
RDW: 17.3 % — ABNORMAL HIGH (ref 11.5–15.5)
WBC: 5.3 K/uL (ref 4.0–10.5)
nRBC: 0 % (ref 0.0–0.2)

## 2024-01-12 LAB — IRON AND TIBC
Iron: 51 ug/dL (ref 28–170)
Saturation Ratios: 12 % (ref 10.4–31.8)
TIBC: 428 ug/dL (ref 250–450)
UIBC: 377 ug/dL

## 2024-01-12 LAB — FERRITIN: Ferritin: 11 ng/mL (ref 11–307)

## 2024-01-13 ENCOUNTER — Inpatient Hospital Stay (HOSPITAL_BASED_OUTPATIENT_CLINIC_OR_DEPARTMENT_OTHER): Admitting: Oncology

## 2024-01-13 ENCOUNTER — Encounter: Payer: Self-pay | Admitting: Oncology

## 2024-01-13 ENCOUNTER — Ambulatory Visit

## 2024-01-13 ENCOUNTER — Inpatient Hospital Stay

## 2024-01-13 VITALS — BP 140/60 | HR 90 | Temp 97.6°F | Resp 18 | Ht 64.0 in | Wt 213.0 lb

## 2024-01-13 DIAGNOSIS — R319 Hematuria, unspecified: Secondary | ICD-10-CM

## 2024-01-13 DIAGNOSIS — G8929 Other chronic pain: Secondary | ICD-10-CM

## 2024-01-13 DIAGNOSIS — D509 Iron deficiency anemia, unspecified: Secondary | ICD-10-CM | POA: Diagnosis not present

## 2024-01-13 LAB — URINALYSIS, COMPLETE (UACMP) WITH MICROSCOPIC
Bacteria, UA: NONE SEEN
Bilirubin Urine: NEGATIVE
Glucose, UA: >=500 mg/dL — AB
Ketones, ur: NEGATIVE mg/dL
Leukocytes,Ua: NEGATIVE
Nitrite: NEGATIVE
Protein, ur: NEGATIVE mg/dL
Specific Gravity, Urine: 1.002 — ABNORMAL LOW (ref 1.005–1.030)
WBC, UA: 0 WBC/hpf (ref 0–5)
pH: 5 (ref 5.0–8.0)

## 2024-01-13 NOTE — Progress Notes (Unsigned)
 [facility]  Telephone:([phone] Fax:(336) [phone]  ID: Deanna Gay OB: 03-11-1956  MR#: [id number]  RDW#:[id number]  Patient Care Team: Lenon Layman ORN, MD as PCP - General (Internal Medicine) Jacobo Evalene PARAS, MD as Consulting Physician (Oncology)   CHIEF COMPLAINT: Iron  deficiency anemia.  INTERVAL HISTORY: Patient returns to clinic today for repeat laboratory work, further evaluation, and consideration of additional IV Feraheme .  She has chronic fatigue, but relates this to her chronic pain.  She otherwise feels well.  She has no neurologic complaints.  She denies any recent fevers or illnesses.  She denies any chest pain, shortness of breath, cough, or hemoptysis.  She denies any nausea, vomiting, constipation, or diarrhea.  She has no melena or hematochezia.  She has no urinary complaints.  Patient offers no further specific complaints today.  REVIEW OF SYSTEMS:   Review of Systems  Constitutional:  Positive for malaise/fatigue. Negative for fever and weight loss.  Respiratory: Negative.  Negative for cough, hemoptysis and shortness of breath.   Cardiovascular: Negative.  Negative for chest pain and leg swelling.  Gastrointestinal: Negative.  Negative for abdominal pain, blood in stool and melena.  Genitourinary: Negative.  Negative for frequency and hematuria.  Musculoskeletal: Negative.  Negative for myalgias.  Skin: Negative.  Negative for rash.  Neurological: Negative.  Negative for dizziness, focal weakness, weakness and headaches.  Psychiatric/Behavioral: Negative.  The patient is not nervous/anxious.     As per HPI. Otherwise, a complete review of systems is negative.  PAST MEDICAL HISTORY: Past Medical History:  Diagnosis Date   Anemia    Anxiety    Aortic atherosclerosis (HCC)    Arthritis    Bilateral carpal tunnel syndrome    Cancer (HCC)    cervical   Chronic undifferentiated schizophrenia (HCC)    COPD (chronic  obstructive pulmonary disease) (HCC)    COPD exacerbation (HCC) 07/19/2017   Depression    Diabetes mellitus without complication (HCC)    GERD (gastroesophageal reflux disease)    Hyperlipidemia    Hypertension    Iron  deficiency anemia    Schizophrenia (HCC)    Sleep apnea    Spinal stenosis of lumbar region with neurogenic claudication     PAST SURGICAL HISTORY: Past Surgical History:  Procedure Laterality Date   APPENDECTOMY     COLONOSCOPY N/A 01/24/2021   Procedure: COLONOSCOPY;  Surgeon: Onita Elspeth Sharper, DO;  Location: [facility] ENDOSCOPY;  Service: Gastroenterology;  Laterality: N/A;   COLONOSCOPY N/A 09/28/2023   Procedure: COLONOSCOPY;  Surgeon: Onita Elspeth Sharper, DO;  Location: [facility] ENDOSCOPY;  Service: Gastroenterology;  Laterality: N/A;   COLONOSCOPY WITH PROPOFOL  N/A 12/25/2014   Procedure: COLONOSCOPY WITH PROPOFOL ;  Surgeon: Donnice Vaughn Manes, MD;  Location: [facility] ENDOSCOPY;  Service: Endoscopy;  Laterality: N/A;   COLONOSCOPY WITH PROPOFOL  N/A 01/21/2018   Procedure: COLONOSCOPY WITH PROPOFOL ;  Surgeon: Unk Corinn Skiff, MD;  Location: [facility] ENDOSCOPY;  Service: Gastroenterology;  Laterality: N/A;   ESOPHAGOGASTRODUODENOSCOPY N/A 01/24/2021   Procedure: ESOPHAGOGASTRODUODENOSCOPY (EGD);  Surgeon: Onita Elspeth Sharper, DO;  Location: [facility] ENDOSCOPY;  Service: Gastroenterology;  Laterality: N/A;   ESOPHAGOGASTRODUODENOSCOPY N/A 09/28/2023   Procedure: EGD (ESOPHAGOGASTRODUODENOSCOPY);  Surgeon: Onita Elspeth Sharper, DO;  Location: [facility] ENDOSCOPY;  Service: Gastroenterology;  Laterality: N/A;   ESOPHAGOGASTRODUODENOSCOPY (EGD) WITH PROPOFOL  N/A 12/25/2014   Procedure: ESOPHAGOGASTRODUODENOSCOPY (EGD) WITH PROPOFOL ;  Surgeon: Donnice Vaughn Manes, MD;  Location: [facility] ENDOSCOPY;  Service: Endoscopy;  Laterality: N/A;   ESOPHAGOGASTRODUODENOSCOPY (EGD) WITH PROPOFOL  N/A 01/21/2018   Procedure:  ESOPHAGOGASTRODUODENOSCOPY (EGD) WITH PROPOFOL ;  Surgeon: Unk Corinn Skiff, MD;   Location: [facility] ENDOSCOPY;  Service: Gastroenterology;  Laterality: N/A;   GIVENS CAPSULE STUDY N/A 03/02/2018   Procedure: GIVENS CAPSULE STUDY;  Surgeon: Unk Corinn Skiff, MD;  Location: [facility] ENDOSCOPY;  Service: Gastroenterology;  Laterality: N/A;   JOINT REPLACEMENT     right hip   POLYPECTOMY  09/28/2023   Procedure: POLYPECTOMY, INTESTINE;  Surgeon: Onita Elspeth Sharper, DO;  Location: [facility] ENDOSCOPY;  Service: Gastroenterology;;   TOTAL HIP ARTHROPLASTY Left 05/16/2016   Procedure: LEFT TOTAL HIP ARTHROPLASTY ANTERIOR APPROACH;  Surgeon: Lonni CINDERELLA Poli, MD;  Location: [facility] ORS;  Service: Orthopedics;  Laterality: Left;    FAMILY HISTORY Family History  Problem Relation Age of Onset   Hypertension Mother    Diabetes Mellitus II Mother    Breast cancer Neg Hx    Kidney cancer Neg Hx    Bladder Cancer Neg Hx        ADVANCED DIRECTIVES:    HEALTH MAINTENANCE: Social History   Tobacco Use   Smoking status: Every Day    Current packs/day: 1.00    Average packs/day: 1 pack/day for 48.0 years (48.0 ttl pk-yrs)    Types: Cigarettes   Smokeless tobacco: Never  Vaping Use   Vaping status: Some Days  Substance Use Topics   Alcohol use: No   Drug use: No     Colonoscopy:  PAP:  Bone density:  Lipid panel:  Allergies  Allergen Reactions   Penicillins Swelling    Has patient had a PCN reaction causing immediate rash, facial/tongue/throat swelling, SOB or lightheadedness with hypotension: Yes Has patient had a PCN reaction causing severe rash involving mucus membranes or skin necrosis: Yes Has patient had a PCN reaction that required hospitalization: No Has patient had a PCN reaction occurring within the last 10 years: No If all of the above answers are NO, then may proceed with Cephalosporin use.  Has patient had a PCN reaction causing immediate rash, facial/tongue/throat swelling, SOB or lightheadedness with hypotension: Yes Has patient had a PCN reaction  causing severe rash involving mucus membranes or skin necrosis: Yes Has patient had a PCN reaction that required hospitalization: No Has patient had a PCN reaction occurring within the last 10 years: No If all of the above answers are NO, then may proceed with Cephalosporin use. Has patient had a PCN reaction causing immediate rash, faci   Aspirin  Other (See Comments)    Ongoing anemia   Iodinated Contrast Media Swelling   Iodine Swelling    Current Outpatient Medications  Medication Sig Dispense Refill   ACCU-CHEK AVIVA PLUS test strip use 1 TEST STRIP to TEST BLOOD SUGAR four times a day  0   albuterol  (PROVENTIL  HFA;VENTOLIN  HFA) 108 (90 BASE) MCG/ACT inhaler Inhale 2 puffs into the lungs every 4 (four) hours as needed for wheezing or shortness of breath.      atorvastatin (LIPITOR) 40 MG tablet Take 40 mg by mouth daily.     BANOPHEN  25 MG capsule SMARTSIG:2 Capsule(s) By Mouth     Blood Glucose Monitoring Suppl (ACCU-CHEK AVIVA PLUS) w/Device KIT See admin instructions.  0   budesonide -formoterol  (SYMBICORT ) 160-4.5 MCG/ACT inhaler Inhale 2 puffs into the lungs 2 (two) times daily.     Calcium  Carb-Cholecalciferol  600-400 MG-UNIT TABS Take 1 tablet by mouth daily.     calcium  carbonate (OSCAL) 1500 (600 Ca) MG TABS tablet Take by mouth 2 (two) times daily with a meal.  celecoxib (CELEBREX) 200 MG capsule Take 200 mg by mouth every 12 (twelve) hours.     cyclobenzaprine  (FLEXERIL ) 10 MG tablet Take 10 mg by mouth at bedtime.     diltiazem  (CARDIZEM  CD) 180 MG 24 hr capsule Take 180 mg by mouth at bedtime.      diltiazem  (CARDIZEM ) 60 MG tablet Take tablet by mouth TWO hours prior to your cardiac CT scan. 1 tablet 0   diltiazem  (TIAZAC ) 180 MG 24 hr capsule Take by mouth.     docusate sodium  (COLACE) 100 MG capsule Take 100 mg by mouth 2 (two) times daily.     escitalopram  (LEXAPRO ) 10 MG tablet Take 10 mg by mouth daily.     fexofenadine (ALLEGRA) 180 MG tablet Take 180 mg by  mouth daily.     fluticasone  (FLONASE) 50 MCG/ACT nasal spray Place 2 sprays into both nostrils daily.     furosemide  (LASIX ) 20 MG tablet Take 20 mg by mouth daily.     hydrochlorothiazide  (HYDRODIURIL ) 25 MG tablet take 1 tablet by mouth every morning     insulin  glargine (LANTUS ) 100 UNIT/ML injection Inject 0.1 mLs (10 Units total) into the skin at bedtime. 10 mL 11   insulin  lispro (HUMALOG ) 100 UNIT/ML injection Please check your sugars before each meal and use lispro per the below sliding scale:  For sugars 151-200: take 2 units For 201-250: take 4units For 251-300: take 6 units For 301-350: take 8 units For 351-400: take 10 units For > 401: call MD 10 mL 11   Insulin  Syringe-Needle U-100 31G X 5/16 0.5 ML MISC use as directed once daily     ipratropium-albuterol  (DUONEB) 0.5-2.5 (3) MG/3ML SOLN Take 3 mLs by nebulization every 6 (six) hours as needed.     iron  polysaccharides (NIFEREX) 150 MG capsule Take 1 capsule (150 mg total) by mouth 2 (two) times daily. 90 capsule 3   JARDIANCE 10 MG TABS tablet Take 10 mg by mouth daily.     losartan  (COZAAR ) 50 MG tablet Take 50 mg by mouth daily.     Magnesium  250 MG TABS Take by mouth daily.     meloxicam  (MOBIC ) 15 MG tablet Take 15 mg by mouth daily.     metoprolol  succinate (TOPROL -XL) 25 MG 24 hr tablet Take 25 mg by mouth daily.     metoprolol  tartrate (LOPRESSOR ) 100 MG tablet Take 1 tablet (100 mg total) by mouth once for 1 dose. Please take one time dose 100mg  metoprolol  tartrate 2 hr prior to cardiac CT for HR control IF HR >55bpm. 1 tablet 0   montelukast (SINGULAIR) 10 MG tablet Take 10 mg by mouth at bedtime.     MOUNJARO 5 MG/0.5ML Pen Inject 5 mg into the skin once a week.     Multiple Vitamins-Minerals (ZINC  PO) Take by mouth.     mupirocin  ointment (BACTROBAN ) 2 % Apply 1 Application topically 2 (two) times daily. Apply to wounds QD. 22 g 11   Naloxone HCl (EVZIO) 0.4 MG/0.4ML SOAJ Inject as directed.     nitroGLYCERIN   (NITROSTAT ) 0.4 MG SL tablet Place 1 tablet under the tongue every 5 (five) minutes as needed.  0   nitroGLYCERIN  (NITROSTAT ) 0.4 MG SL tablet Place under the tongue.     omeprazole (PRILOSEC) 40 MG capsule Take 40 mg by mouth 2 (two) times daily.     Oxycodone  HCl 10 MG TABS Take 10 mg by mouth every 6 (six) hours.     oxyCODONE -acetaminophen  (  PERCOCET) 7.5-325 MG tablet Take 1-2 tablets by mouth every 6 (six) hours as needed for severe pain. 30 tablet 0   potassium chloride  (KLOR-CON ) 10 MEQ tablet Take 10 mEq by mouth daily.     predniSONE  (DELTASONE ) 10 MG tablet Label  & dispense according to the schedule below. 5 Pills PO for 1 day then, 4 Pills PO for 1 day, 3 Pills PO for 1 day, 2 Pills PO for 1 day, 1 Pill PO for 1 days then STOP. 15 tablet 0   predniSONE  (DELTASONE ) 50 MG tablet Take table by mouth 13 hours, 7 hours, and 1 hour prior to your cardiac CT scan. 3 tablet 0   pregabalin  (LYRICA ) 25 MG capsule Take 25 mg by mouth 2 (two) times daily.     roflumilast (DALIRESP) 500 MCG TABS tablet Take 500 mcg by mouth daily.     tiotropium (SPIRIVA) 18 MCG inhalation capsule Place 18 mcg into inhaler and inhale daily.     triamcinolone  (KENALOG ) 0.1 % paste Use as directed 1 application in the mouth or throat 2 (two) times daily.     TRULICITY 0.75 MG/0.5ML SOPN Inject 0.75 mg into the skin once a week.     vitamin B-12 (CYANOCOBALAMIN) 500 MCG tablet Take 500 mcg by mouth daily.     No current facility-administered medications for this visit.    OBJECTIVE: Vitals:   01/13/24 1015  BP: (!) 140/60  Pulse: 90  Resp: 18  Temp: 97.6 F (36.4 C)  SpO2: 95%     Body mass index is 36.56 kg/m.    ECOG FS:0 - Asymptomatic  General: Well-developed, well-nourished, no acute distress. Eyes: Pink conjunctiva, anicteric sclera. HEENT: Normocephalic, moist mucous membranes. Lungs: No audible wheezing or coughing. Heart: Regular rate and rhythm. Abdomen: Soft, nontender, no obvious  distention. Musculoskeletal: No edema, cyanosis, or clubbing. Neuro: Alert, answering all questions appropriately. Cranial nerves grossly intact. Skin: No rashes or petechiae noted. Psych: Normal affect.  LAB RESULTS:  Lab Results  Component Value Date   NA 133 (L) 03/26/2023   K 3.3 (L) 03/26/2023   CL 94 (L) 03/26/2023   CO2 27 03/26/2023   GLUCOSE 153 (H) 03/26/2023   BUN 14 03/26/2023   CREATININE 1.25 (H) 03/26/2023   CALCIUM  8.4 (L) 03/26/2023   PROT 7.9 03/26/2023   ALBUMIN 3.1 (L) 03/26/2023   AST 14 (L) 03/26/2023   ALT 9 03/26/2023   ALKPHOS 97 03/26/2023   BILITOT 0.6 03/26/2023   GFRNONAA 47 (L) 03/26/2023   GFRAA 49 (L) 10/31/2019    Lab Results  Component Value Date   WBC 5.3 01/12/2024   NEUTROABS 3.3 01/12/2024   HGB 12.5 01/12/2024   HCT 39.7 01/12/2024   MCV 85.0 01/12/2024   PLT 238 01/12/2024   Lab Results  Component Value Date   FERRITIN 11 01/12/2024   Lab Results  Component Value Date   IRON  51 01/12/2024   TIBC 428 01/12/2024   IRONPCTSAT 12 01/12/2024      STUDIES: MM 3D SCREENING MAMMOGRAM BILATERAL BREAST Result Date: 01/07/2024 CLINICAL DATA:  Screening. EXAM: MM DIGITAL SCREENING BILAT W/ TOMO AND CAD TECHNIQUE: Bilateral screening digital craniocaudal and mediolateral oblique mammograms were obtained. Bilateral screening digital breast tomosynthesis was performed. The images were evaluated with computer-aided detection. COMPARISON:  Previous exam(s). ACR Breast Density Category b: There are scattered areas of fibroglandular density. FINDINGS: There are no findings suspicious for malignancy. IMPRESSION: No mammographic evidence of malignancy. A result letter of  this screening mammogram will be mailed directly to the patient. RECOMMENDATION: Screening mammogram in one year. (Code:SM-B-01Y) BI-RADS CATEGORY  1: Negative. Electronically Signed   By: Inocente Ast M.D.   On: 01/07/2024 13:55    ASSESSMENT: Iron  deficiency  anemia.  PLAN:    Iron  deficiency anemia: Patient's hemoglobin and iron  stores continue to be within normal limits.  Her most recent colonoscopy and endoscopy on January 24, 2021 revealed multiple polyps, but no other significant pathology.  She was previously given a referral back to GI for consideration of additional luminal evaluation.  She does not require additional IV Feraheme  today.  Patient last received treatment on June 19, 2023.  No intervention is needed at this time.  Return to clinic in 4 months with repeat laboratory work, further evaluation, and continuation of treatment if needed.   Pulmonary nodule: Resolved.  Chronic pain: Patient states that she needs a new pain doctor and a referral has been sent.  Patient expressed understanding and was in agreement with this plan. She also understands that She can call clinic at any time with any questions, concerns, or complaints.    Evalene JINNY Reusing, MD   01/14/2024 7:16 AM

## 2024-01-13 NOTE — Progress Notes (Unsigned)
 Patient said that she is in severe pain all over her body, rates her pain at a 10. She is looking for a new pain clinic doctor.

## 2024-01-14 ENCOUNTER — Encounter: Payer: Self-pay | Admitting: Oncology

## 2024-01-28 ENCOUNTER — Encounter: Payer: Self-pay | Admitting: Student in an Organized Health Care Education/Training Program

## 2024-01-28 ENCOUNTER — Ambulatory Visit
Attending: Student in an Organized Health Care Education/Training Program | Admitting: Student in an Organized Health Care Education/Training Program

## 2024-01-28 VITALS — BP 144/78 | Temp 97.0°F | Resp 16 | Ht 64.0 in | Wt 214.0 lb

## 2024-01-28 DIAGNOSIS — G894 Chronic pain syndrome: Secondary | ICD-10-CM | POA: Insufficient documentation

## 2024-01-28 NOTE — Progress Notes (Signed)
 No E&M

## 2024-04-12 ENCOUNTER — Other Ambulatory Visit: Payer: Self-pay

## 2024-04-12 DIAGNOSIS — N1832 Chronic kidney disease, stage 3b: Secondary | ICD-10-CM

## 2024-04-12 DIAGNOSIS — D509 Iron deficiency anemia, unspecified: Secondary | ICD-10-CM

## 2024-04-12 MED ORDER — POLYSACCHARIDE IRON COMPLEX 150 MG PO CAPS: 150.0000 mg | ORAL_CAPSULE | Freq: Two times a day (BID) | ORAL | 3 refills | Status: AC

## 2024-04-27 ENCOUNTER — Other Ambulatory Visit
Admission: RE | Admit: 2024-04-27 | Discharge: 2024-04-27 | Disposition: A | Discharge status: Home / Self Care | Source: Ambulatory Visit | Attending: Internal Medicine | Admitting: Internal Medicine

## 2024-04-27 DIAGNOSIS — F209 Schizophrenia, unspecified: Secondary | ICD-10-CM | POA: Insufficient documentation

## 2024-04-27 DIAGNOSIS — R0609 Other forms of dyspnea: Secondary | ICD-10-CM | POA: Diagnosis present

## 2024-04-27 DIAGNOSIS — R0789 Other chest pain: Secondary | ICD-10-CM | POA: Diagnosis present

## 2024-04-27 LAB — D-DIMER, QUANTITATIVE: D-Dimer, Quant: 0.36 ug{FEU}/mL (ref 0.00–0.50)

## 2024-04-27 LAB — TROPONIN T, HIGH SENSITIVITY: Troponin T High Sensitivity: <15 ng/L (ref 0–19)

## 2024-05-16 ENCOUNTER — Inpatient Hospital Stay: Attending: Oncology

## 2024-05-16 DIAGNOSIS — R5381 Other malaise: Secondary | ICD-10-CM | POA: Insufficient documentation

## 2024-05-16 DIAGNOSIS — G8929 Other chronic pain: Secondary | ICD-10-CM | POA: Insufficient documentation

## 2024-05-16 DIAGNOSIS — D509 Iron deficiency anemia, unspecified: Secondary | ICD-10-CM | POA: Diagnosis present

## 2024-05-16 DIAGNOSIS — R000 Tachycardia, unspecified: Secondary | ICD-10-CM | POA: Insufficient documentation

## 2024-05-16 LAB — CBC WITH DIFFERENTIAL/PLATELET
Abs Immature Granulocytes: 0.05 K/uL (ref 0.00–0.07)
Basophils Absolute: 0 K/uL (ref 0.0–0.1)
Basophils Relative: 0 %
Eosinophils Absolute: 0 K/uL (ref 0.0–0.5)
Eosinophils Relative: 0 %
HCT: 38.7 % (ref 36.0–46.0)
Hemoglobin: 11.7 g/dL — ABNORMAL LOW (ref 12.0–15.0)
Immature Granulocytes: 1 %
Lymphocytes Relative: 9 %
Lymphs Abs: 1 K/uL (ref 0.7–4.0)
MCH: 23.6 pg — ABNORMAL LOW (ref 26.0–34.0)
MCHC: 30.2 g/dL (ref 30.0–36.0)
MCV: 78 fL — ABNORMAL LOW (ref 80.0–100.0)
Monocytes Absolute: 0.7 K/uL (ref 0.1–1.0)
Monocytes Relative: 7 %
Neutro Abs: 8.5 K/uL — ABNORMAL HIGH (ref 1.7–7.7)
Neutrophils Relative %: 83 %
Platelets: 208 K/uL (ref 150–400)
RBC: 4.96 MIL/uL (ref 3.87–5.11)
RDW: 18 % — ABNORMAL HIGH (ref 11.5–15.5)
WBC: 10.3 K/uL (ref 4.0–10.5)
nRBC: 0 % (ref 0.0–0.2)

## 2024-05-16 LAB — IRON AND TIBC
Iron: 15 ug/dL — ABNORMAL LOW (ref 28–170)
Saturation Ratios: 4 % — ABNORMAL LOW (ref 10.4–31.8)
TIBC: 350 ug/dL (ref 250–450)
UIBC: 336 ug/dL

## 2024-05-16 LAB — FERRITIN: Ferritin: 102 ng/mL (ref 11–307)

## 2024-05-17 ENCOUNTER — Ambulatory Visit

## 2024-05-17 ENCOUNTER — Ambulatory Visit: Admitting: Oncology

## 2024-05-17 ENCOUNTER — Inpatient Hospital Stay

## 2024-05-17 ENCOUNTER — Encounter: Payer: Self-pay | Admitting: Nurse Practitioner

## 2024-05-17 ENCOUNTER — Inpatient Hospital Stay (HOSPITAL_BASED_OUTPATIENT_CLINIC_OR_DEPARTMENT_OTHER): Admitting: Nurse Practitioner

## 2024-05-17 VITALS — BP 118/53 | HR 101 | Temp 97.4°F | Resp 20

## 2024-05-17 DIAGNOSIS — N1832 Chronic kidney disease, stage 3b: Secondary | ICD-10-CM

## 2024-05-17 DIAGNOSIS — D631 Anemia in chronic kidney disease: Secondary | ICD-10-CM

## 2024-05-17 DIAGNOSIS — D509 Iron deficiency anemia, unspecified: Secondary | ICD-10-CM | POA: Diagnosis not present

## 2024-05-17 MED ORDER — SODIUM CHLORIDE 0.9 % IV SOLN
510.0000 mg | Freq: Once | INTRAVENOUS | Status: AC
  Administered 2024-05-17: 510 mg via INTRAVENOUS
  Filled 2024-05-17: qty 510

## 2024-05-17 MED ORDER — SODIUM CHLORIDE 0.9 % IV SOLN
INTRAVENOUS | Status: DC
  Filled 2024-05-17: qty 250

## 2024-05-17 NOTE — Progress Notes (Signed)
 Pt reports too weak to stand to weigh today.  States she has been very fatigued, has productive cough, reports brown in color.  Pt also reports she has not taken medications in 3 days because she has stayed in bed due to not feeling weak and unwell. Pt did not have oxygen on and pulse ox was 82%.

## 2024-05-17 NOTE — Progress Notes (Signed)
 " [facility]  [phone] [phone]  ID: Deanna Gay OB: 1955/08/24  MR#: [id number]  RDW#:[id number]  Patient Care Team: Lenon Layman ORN, MD as PCP - General (Internal Medicine) Jacobo Evalene PARAS, MD as Consulting Physician (Oncology)  CHIEF COMPLAINT: Iron  deficiency anemia  INTERVAL HISTORY: Patient returns to clinic today for repeat laboratory work, further evaluation, and consideration of additional IV Feraheme . She has felt poorly for past week. Was started on antibiotics and steroids by Dr Theotis. She has ongoing body aches which is chronic. Denies fever. Has chronic cough. She believes she needs IV iron . Denies any neurologic complaints. Denies any easy bleeding or bruising. No melena or hematochezia. No pica or restless leg. Reports good appetite and denies weight loss. Denies chest pain. Denies any nausea, vomiting, constipation, or diarrhea. Denies urinary complaints. Denies vaginal bleeding.  Patient offers no further specific complaints today.  REVIEW OF SYSTEMS:   Review of Systems  Constitutional:  Positive for malaise/fatigue. Negative for fever and weight loss.  HENT:  Negative for sore throat.   Respiratory:  Positive for cough, sputum production and shortness of breath. Negative for hemoptysis.   Cardiovascular:  Negative for chest pain and leg swelling.  Gastrointestinal:  Negative for abdominal pain, blood in stool, constipation, diarrhea and melena.  Genitourinary:  Negative for frequency and hematuria.  Musculoskeletal:  Positive for joint pain. Negative for falls and myalgias.  Skin: Negative.  Negative for rash.  Neurological: Negative.  Negative for dizziness, focal weakness, weakness and headaches.  Endo/Heme/Allergies:  Does not bruise/bleed easily.  Psychiatric/Behavioral: Negative.  Negative for depression. The patient is not nervous/anxious.   As per HPI. Otherwise, a complete review of systems is  negative.  PAST MEDICAL HISTORY: Past Medical History:  Diagnosis Date   Anemia    Anxiety    Aortic atherosclerosis    Arthritis    Bilateral carpal tunnel syndrome    Cancer (HCC)    cervical   Chronic undifferentiated schizophrenia (HCC)    COPD (chronic obstructive pulmonary disease) (HCC)    COPD exacerbation (HCC) 07/19/2017   Depression    Diabetes mellitus without complication (HCC)    GERD (gastroesophageal reflux disease)    Hyperlipidemia    Hypertension    Iron  deficiency anemia    Schizophrenia (HCC)    Sleep apnea    Spinal stenosis of lumbar region with neurogenic claudication     PAST SURGICAL HISTORY: Past Surgical History:  Procedure Laterality Date   APPENDECTOMY     COLONOSCOPY N/A 01/24/2021   Procedure: COLONOSCOPY;  Surgeon: Onita Elspeth Sharper, DO;  Location: [facility] ENDOSCOPY;  Service: Gastroenterology;  Laterality: N/A;   COLONOSCOPY N/A 09/28/2023   Procedure: COLONOSCOPY;  Surgeon: Onita Elspeth Sharper, DO;  Location: [facility] ENDOSCOPY;  Service: Gastroenterology;  Laterality: N/A;   COLONOSCOPY WITH PROPOFOL  N/A 12/25/2014   Procedure: COLONOSCOPY WITH PROPOFOL ;  Surgeon: Donnice Vaughn Manes, MD;  Location: [facility] ENDOSCOPY;  Service: Endoscopy;  Laterality: N/A;   COLONOSCOPY WITH PROPOFOL  N/A 01/21/2018   Procedure: COLONOSCOPY WITH PROPOFOL ;  Surgeon: Unk Corinn Skiff, MD;  Location: [facility] ENDOSCOPY;  Service: Gastroenterology;  Laterality: N/A;   ESOPHAGOGASTRODUODENOSCOPY N/A 01/24/2021   Procedure: ESOPHAGOGASTRODUODENOSCOPY (EGD);  Surgeon: Onita Elspeth Sharper, DO;  Location: [facility] ENDOSCOPY;  Service: Gastroenterology;  Laterality: N/A;   ESOPHAGOGASTRODUODENOSCOPY N/A 09/28/2023   Procedure: EGD (ESOPHAGOGASTRODUODENOSCOPY);  Surgeon: Onita Elspeth Sharper, DO;  Location: [facility] ENDOSCOPY;  Service: Gastroenterology;  Laterality: N/A;   ESOPHAGOGASTRODUODENOSCOPY (EGD) WITH PROPOFOL  N/A 12/25/2014  Procedure: ESOPHAGOGASTRODUODENOSCOPY (EGD) WITH  PROPOFOL ;  Surgeon: Donnice Vaughn Manes, MD;  Location: [facility] ENDOSCOPY;  Service: Endoscopy;  Laterality: N/A;   ESOPHAGOGASTRODUODENOSCOPY (EGD) WITH PROPOFOL  N/A 01/21/2018   Procedure: ESOPHAGOGASTRODUODENOSCOPY (EGD) WITH PROPOFOL ;  Surgeon: Unk Corinn Skiff, MD;  Location: [facility] ENDOSCOPY;  Service: Gastroenterology;  Laterality: N/A;   GIVENS CAPSULE STUDY N/A 03/02/2018   Procedure: GIVENS CAPSULE STUDY;  Surgeon: Unk Corinn Skiff, MD;  Location: [facility] ENDOSCOPY;  Service: Gastroenterology;  Laterality: N/A;   JOINT REPLACEMENT     right hip   POLYPECTOMY  09/28/2023   Procedure: POLYPECTOMY, INTESTINE;  Surgeon: Onita Elspeth Sharper, DO;  Location: [facility] ENDOSCOPY;  Service: Gastroenterology;;   TOTAL HIP ARTHROPLASTY Left 05/16/2016   Procedure: LEFT TOTAL HIP ARTHROPLASTY ANTERIOR APPROACH;  Surgeon: Lonni CINDERELLA Poli, MD;  Location: [facility] ORS;  Service: Orthopedics;  Laterality: Left;    FAMILY HISTORY Family History  Problem Relation Age of Onset   Hypertension Mother    Diabetes Mellitus II Mother    Breast cancer Neg Hx    Kidney cancer Neg Hx    Bladder Cancer Neg Hx       ADVANCED DIRECTIVES:    HEALTH MAINTENANCE: Social History   Tobacco Use   Smoking status: Every Day    Current packs/day: 1.00    Average packs/day: 1 pack/day for 48.0 years (48.0 ttl pk-yrs)    Types: Cigarettes   Smokeless tobacco: Never  Vaping Use   Vaping status: Some Days  Substance Use Topics   Alcohol use: No   Drug use: No    Colonoscopy:  PAP:  Bone density:  Lipid panel:  Allergies  Allergen Reactions   Penicillins Swelling    Has patient had a PCN reaction causing immediate rash, facial/tongue/throat swelling, SOB or lightheadedness with hypotension: Yes Has patient had a PCN reaction causing severe rash involving mucus membranes or skin necrosis: Yes Has patient had a PCN reaction that required hospitalization: No Has patient had a PCN reaction occurring within  the last 10 years: No If all of the above answers are NO, then may proceed with Cephalosporin use.  Has patient had a PCN reaction causing immediate rash, facial/tongue/throat swelling, SOB or lightheadedness with hypotension: Yes Has patient had a PCN reaction causing severe rash involving mucus membranes or skin necrosis: Yes Has patient had a PCN reaction that required hospitalization: No Has patient had a PCN reaction occurring within the last 10 years: No If all of the above answers are NO, then may proceed with Cephalosporin use. Has patient had a PCN reaction causing immediate rash, faci   Aspirin  Other (See Comments)    Ongoing anemia   Iodinated Contrast Media Swelling   Iodine Swelling    Current Outpatient Medications  Medication Sig Dispense Refill   ACCU-CHEK AVIVA PLUS test strip use 1 TEST STRIP to TEST BLOOD SUGAR four times a day (Patient not taking: Reported on 05/17/2024)  0   albuterol  (PROVENTIL  HFA;VENTOLIN  HFA) 108 (90 BASE) MCG/ACT inhaler Inhale 2 puffs into the lungs every 4 (four) hours as needed for wheezing or shortness of breath.  (Patient not taking: Reported on 05/17/2024)     atorvastatin (LIPITOR) 40 MG tablet Take 40 mg by mouth daily. (Patient not taking: Reported on 05/17/2024)     BANOPHEN  25 MG capsule SMARTSIG:2 Capsule(s) By Mouth (Patient not taking: Reported on 05/17/2024)     Blood Glucose Monitoring Suppl (ACCU-CHEK AVIVA PLUS) w/Device KIT See admin instructions. (Patient not taking: Reported on  05/17/2024)  0   budesonide -formoterol  (SYMBICORT ) 160-4.5 MCG/ACT inhaler Inhale 2 puffs into the lungs 2 (two) times daily. (Patient not taking: Reported on 05/17/2024)     Calcium  Carb-Cholecalciferol  600-400 MG-UNIT TABS Take 1 tablet by mouth daily. (Patient not taking: Reported on 05/17/2024)     calcium  carbonate (OSCAL) 1500 (600 Ca) MG TABS tablet Take by mouth 2 (two) times daily with a meal. (Patient not taking: Reported on 05/17/2024)      celecoxib (CELEBREX) 200 MG capsule Take 200 mg by mouth every 12 (twelve) hours. (Patient not taking: Reported on 05/17/2024)     cyclobenzaprine  (FLEXERIL ) 10 MG tablet Take 10 mg by mouth at bedtime. (Patient not taking: Reported on 05/17/2024)     diltiazem  (CARDIZEM  CD) 180 MG 24 hr capsule Take 180 mg by mouth at bedtime.  (Patient not taking: Reported on 05/17/2024)     diltiazem  (CARDIZEM ) 60 MG tablet Take tablet by mouth TWO hours prior to your cardiac CT scan. (Patient not taking: Reported on 05/17/2024) 1 tablet 0   diltiazem  (TIAZAC ) 180 MG 24 hr capsule Take by mouth. (Patient not taking: Reported on 05/17/2024)     docusate sodium  (COLACE) 100 MG capsule Take 100 mg by mouth 2 (two) times daily. (Patient not taking: Reported on 05/17/2024)     escitalopram  (LEXAPRO ) 10 MG tablet Take 10 mg by mouth daily. (Patient not taking: Reported on 05/17/2024)     fexofenadine (ALLEGRA) 180 MG tablet Take 180 mg by mouth daily. (Patient not taking: Reported on 05/17/2024)     fluticasone  (FLONASE) 50 MCG/ACT nasal spray Place 2 sprays into both nostrils daily. (Patient not taking: Reported on 05/17/2024)     furosemide  (LASIX ) 20 MG tablet Take 20 mg by mouth daily. (Patient not taking: Reported on 05/17/2024)     hydrochlorothiazide  (HYDRODIURIL ) 25 MG tablet take 1 tablet by mouth every morning (Patient not taking: Reported on 05/17/2024)     insulin  glargine (LANTUS ) 100 UNIT/ML injection Inject 0.1 mLs (10 Units total) into the skin at bedtime. (Patient not taking: Reported on 05/17/2024) 10 mL 11   insulin  lispro (HUMALOG ) 100 UNIT/ML injection Please check your sugars before each meal and use lispro per the below sliding scale:  For sugars 151-200: take 2 units For 201-250: take 4units For 251-300: take 6 units For 301-350: take 8 units For 351-400: take 10 units For > 401: call MD (Patient not taking: Reported on 05/17/2024) 10 mL 11   Insulin  Syringe-Needle U-100 31G X 5/16 0.5 ML  MISC use as directed once daily (Patient not taking: Reported on 05/17/2024)     ipratropium-albuterol  (DUONEB) 0.5-2.5 (3) MG/3ML SOLN Take 3 mLs by nebulization every 6 (six) hours as needed. (Patient not taking: Reported on 05/17/2024)     iron  polysaccharides (NIFEREX) 150 MG capsule Take 1 capsule (150 mg total) by mouth 2 (two) times daily. (Patient not taking: Reported on 05/17/2024) 90 capsule 3   JARDIANCE 10 MG TABS tablet Take 10 mg by mouth daily. (Patient not taking: Reported on 05/17/2024)     losartan  (COZAAR ) 50 MG tablet Take 50 mg by mouth daily. (Patient not taking: Reported on 05/17/2024)     Magnesium  250 MG TABS Take by mouth daily. (Patient not taking: Reported on 05/17/2024)     meloxicam  (MOBIC ) 15 MG tablet Take 15 mg by mouth daily. (Patient not taking: Reported on 05/17/2024)     metoprolol  succinate (TOPROL -XL) 25 MG 24 hr tablet Take 25 mg by mouth  daily. (Patient not taking: Reported on 05/17/2024)     metoprolol  tartrate (LOPRESSOR ) 100 MG tablet Take 1 tablet (100 mg total) by mouth once for 1 dose. Please take one time dose 100mg  metoprolol  tartrate 2 hr prior to cardiac CT for HR control IF HR >55bpm. (Patient not taking: Reported on 05/17/2024) 1 tablet 0   montelukast (SINGULAIR) 10 MG tablet Take 10 mg by mouth at bedtime. (Patient not taking: Reported on 05/17/2024)     MOUNJARO 5 MG/0.5ML Pen Inject 5 mg into the skin once a week. (Patient not taking: Reported on 05/17/2024)     Multiple Vitamins-Minerals (ZINC  PO) Take by mouth. (Patient not taking: Reported on 05/17/2024)     mupirocin  ointment (BACTROBAN ) 2 % Apply 1 Application topically 2 (two) times daily. Apply to wounds QD. (Patient not taking: Reported on 05/17/2024) 22 g 11   Naloxone HCl (EVZIO) 0.4 MG/0.4ML SOAJ Inject as directed. (Patient not taking: Reported on 05/17/2024)     nitroGLYCERIN  (NITROSTAT ) 0.4 MG SL tablet Place 1 tablet under the tongue every 5 (five) minutes as needed. (Patient not  taking: Reported on 05/17/2024)  0   nitroGLYCERIN  (NITROSTAT ) 0.4 MG SL tablet Place under the tongue. (Patient not taking: Reported on 05/17/2024)     omeprazole (PRILOSEC) 40 MG capsule Take 40 mg by mouth 2 (two) times daily. (Patient not taking: Reported on 05/17/2024)     Oxycodone  HCl 10 MG TABS Take 10 mg by mouth every 6 (six) hours. (Patient not taking: Reported on 05/17/2024)     oxyCODONE -acetaminophen  (PERCOCET) 7.5-325 MG tablet Take 1-2 tablets by mouth every 6 (six) hours as needed for severe pain. (Patient not taking: Reported on 05/17/2024) 30 tablet 0   potassium chloride  (KLOR-CON ) 10 MEQ tablet Take 10 mEq by mouth daily. (Patient not taking: Reported on 05/17/2024)     predniSONE  (DELTASONE ) 10 MG tablet Label  & dispense according to the schedule below. 5 Pills PO for 1 day then, 4 Pills PO for 1 day, 3 Pills PO for 1 day, 2 Pills PO for 1 day, 1 Pill PO for 1 days then STOP. (Patient not taking: Reported on 05/17/2024) 15 tablet 0   predniSONE  (DELTASONE ) 50 MG tablet Take table by mouth 13 hours, 7 hours, and 1 hour prior to your cardiac CT scan. (Patient not taking: Reported on 05/17/2024) 3 tablet 0   pregabalin  (LYRICA ) 25 MG capsule Take 25 mg by mouth 2 (two) times daily. (Patient not taking: Reported on 05/17/2024)     roflumilast (DALIRESP) 500 MCG TABS tablet Take 500 mcg by mouth daily. (Patient not taking: Reported on 05/17/2024)     tiotropium (SPIRIVA) 18 MCG inhalation capsule Place 18 mcg into inhaler and inhale daily. (Patient not taking: Reported on 05/17/2024)     triamcinolone  (KENALOG ) 0.1 % paste Use as directed 1 application in the mouth or throat 2 (two) times daily. (Patient not taking: Reported on 05/17/2024)     TRULICITY 0.75 MG/0.5ML SOPN Inject 0.75 mg into the skin once a week. (Patient not taking: Reported on 05/17/2024)     vitamin B-12 (CYANOCOBALAMIN ) 500 MCG tablet Take 500 mcg by mouth daily. (Patient not taking: Reported on 05/17/2024)      No current facility-administered medications for this visit.   Facility-Administered Medications Ordered in Other Visits  Medication Dose Route Frequency Provider Last Rate Last Admin   0.9 %  sodium chloride  infusion   Intravenous Continuous Finnegan, Timothy J, MD 10 mL/hr at 05/17/24 1141  New Bag at 05/17/24 1141   ferumoxytol  (FERAHEME ) 510 mg in sodium chloride  0.9 % 100 mL IVPB  510 mg Intravenous Once Dasie Tinnie MATSU, NP       OBJECTIVE: Vitals:   05/17/24 1059 05/17/24 1119  BP: (!) 106/52 (!) 118/53  Pulse: (!) 115 (!) 101  Resp: (!) 21 20  Temp: (!) 97.4 F (36.3 C)   SpO2: (!) 82% 91%   There is no height or weight on file to calculate BMI.     ECOG FS:1 - Symptomatic but completely ambulatory  General: Well-developed, well-nourished, no acute distress. Eyes: Pink conjunctiva, anicteric sclera. HEENT: Normocephalic, moist mucous membranes. Lungs: No audible coughing. Rhonchi heard.  Heart: Regular rate and rhythm. Abdomen: Soft, nontender, no obvious distention. Musculoskeletal: No edema, cyanosis, or clubbing. Neuro: Alert, answering all questions appropriately. Cranial nerves grossly intact. Skin: No rashes or petechiae noted. Psych: Normal affect.  LAB RESULTS: Lab Results  Component Value Date   NA 133 (L) 03/26/2023   K 3.3 (L) 03/26/2023   CL 94 (L) 03/26/2023   CO2 27 03/26/2023   GLUCOSE 153 (H) 03/26/2023   BUN 14 03/26/2023   CREATININE 1.25 (H) 03/26/2023   CALCIUM  8.4 (L) 03/26/2023   PROT 7.9 03/26/2023   ALBUMIN 3.1 (L) 03/26/2023   AST 14 (L) 03/26/2023   ALT 9 03/26/2023   ALKPHOS 97 03/26/2023   BILITOT 0.6 03/26/2023   GFRNONAA 47 (L) 03/26/2023   GFRAA 49 (L) 10/31/2019   Lab Results  Component Value Date   WBC 10.3 05/16/2024   NEUTROABS 8.5 (H) 05/16/2024   HGB 11.7 (L) 05/16/2024   HCT 38.7 05/16/2024   MCV 78.0 (L) 05/16/2024   PLT 208 05/16/2024   Lab Results  Component Value Date   FERRITIN 102 05/16/2024   Lab  Results  Component Value Date   IRON  15 (L) 05/16/2024   TIBC 350 05/16/2024   IRONPCTSAT 4 (L) 05/16/2024   STUDIES: No results found.  ASSESSMENT: Iron  deficiency anemia.  PLAN:    Iron  deficiency anemia: She last received IV Feraheme  06/19/23. Tolerated well. Hmg improved to 11.7, ferritin 102, however, iron  sat 4%. Recommend feraheme  x 1. Recommend she continue oral iron .  Etiology of Iron  Deficiency- her most recent colonoscopy and upper endoscopy with DR Onita on 09/28/23 were reviewed. Colonoscopy revealed diverticulosis in left colon, several plyps removed. EGD unremarkable. She has not had a capsule study.  Malaise & tachycardia- On antibiotics and steroids per pulmonology for possible copd exacerbation. Recommended ER evaluation and compliance with medications.  Pulmonary nodule: resolved.  Chronic pain- previously referred to new pain doctor at patient request  Disposition:  Feraheme  today 4 mo- lab (cbc, bmp, ferritin, iron  studies) D2- see NP, +/- feraheme - la  Patient expressed understanding and was in agreement with this plan. She also understands that She can call clinic at any time with any questions, concerns, or complaints.   Tinnie MATSU Dasie, NP 05/17/2024   "

## 2024-05-18 ENCOUNTER — Emergency Department

## 2024-05-18 ENCOUNTER — Other Ambulatory Visit: Payer: Self-pay

## 2024-05-18 ENCOUNTER — Inpatient Hospital Stay
Admission: EM | Admit: 2024-05-18 | Discharge: 2024-05-21 | DRG: 189 | Disposition: A | Discharge status: Home / Self Care | Source: Ambulatory Visit | Attending: Student in an Organized Health Care Education/Training Program | Admitting: Student in an Organized Health Care Education/Training Program

## 2024-05-18 DIAGNOSIS — Z7951 Long term (current) use of inhaled steroids: Secondary | ICD-10-CM

## 2024-05-18 DIAGNOSIS — Z7985 Long-term (current) use of injectable non-insulin antidiabetic drugs: Secondary | ICD-10-CM

## 2024-05-18 DIAGNOSIS — J441 Chronic obstructive pulmonary disease with (acute) exacerbation: Secondary | ICD-10-CM | POA: Diagnosis not present

## 2024-05-18 DIAGNOSIS — Z833 Family history of diabetes mellitus: Secondary | ICD-10-CM

## 2024-05-18 DIAGNOSIS — B9620 Unspecified Escherichia coli [E. coli] as the cause of diseases classified elsewhere: Secondary | ICD-10-CM | POA: Diagnosis present

## 2024-05-18 DIAGNOSIS — Z880 Allergy status to penicillin: Secondary | ICD-10-CM

## 2024-05-18 DIAGNOSIS — Z91040 Latex allergy status: Secondary | ICD-10-CM

## 2024-05-18 DIAGNOSIS — Z886 Allergy status to analgesic agent status: Secondary | ICD-10-CM

## 2024-05-18 DIAGNOSIS — Z860100 Personal history of colon polyps, unspecified: Secondary | ICD-10-CM

## 2024-05-18 DIAGNOSIS — R8271 Bacteriuria: Secondary | ICD-10-CM | POA: Diagnosis present

## 2024-05-18 DIAGNOSIS — J9601 Acute respiratory failure with hypoxia: Principal | ICD-10-CM | POA: Diagnosis present

## 2024-05-18 DIAGNOSIS — D509 Iron deficiency anemia, unspecified: Secondary | ICD-10-CM | POA: Diagnosis present

## 2024-05-18 DIAGNOSIS — Z6834 Body mass index (BMI) 34.0-34.9, adult: Secondary | ICD-10-CM

## 2024-05-18 DIAGNOSIS — E119 Type 2 diabetes mellitus without complications: Secondary | ICD-10-CM | POA: Diagnosis present

## 2024-05-18 DIAGNOSIS — Z79899 Other long term (current) drug therapy: Secondary | ICD-10-CM

## 2024-05-18 DIAGNOSIS — Z888 Allergy status to other drugs, medicaments and biological substances status: Secondary | ICD-10-CM

## 2024-05-18 DIAGNOSIS — Z8249 Family history of ischemic heart disease and other diseases of the circulatory system: Secondary | ICD-10-CM

## 2024-05-18 DIAGNOSIS — I10 Essential (primary) hypertension: Secondary | ICD-10-CM | POA: Diagnosis present

## 2024-05-18 DIAGNOSIS — R110 Nausea: Secondary | ICD-10-CM | POA: Diagnosis present

## 2024-05-18 DIAGNOSIS — E785 Hyperlipidemia, unspecified: Secondary | ICD-10-CM | POA: Diagnosis present

## 2024-05-18 DIAGNOSIS — N179 Acute kidney failure, unspecified: Secondary | ICD-10-CM | POA: Diagnosis present

## 2024-05-18 DIAGNOSIS — E860 Dehydration: Secondary | ICD-10-CM | POA: Diagnosis present

## 2024-05-18 DIAGNOSIS — G4730 Sleep apnea, unspecified: Secondary | ICD-10-CM | POA: Diagnosis present

## 2024-05-18 DIAGNOSIS — Z791 Long term (current) use of non-steroidal anti-inflammatories (NSAID): Secondary | ICD-10-CM

## 2024-05-18 DIAGNOSIS — F209 Schizophrenia, unspecified: Secondary | ICD-10-CM | POA: Diagnosis present

## 2024-05-18 DIAGNOSIS — Z7984 Long term (current) use of oral hypoglycemic drugs: Secondary | ICD-10-CM

## 2024-05-18 DIAGNOSIS — Z716 Tobacco abuse counseling: Secondary | ICD-10-CM

## 2024-05-18 DIAGNOSIS — F17210 Nicotine dependence, cigarettes, uncomplicated: Secondary | ICD-10-CM | POA: Diagnosis present

## 2024-05-18 DIAGNOSIS — Z794 Long term (current) use of insulin: Secondary | ICD-10-CM

## 2024-05-18 DIAGNOSIS — R0902 Hypoxemia: Secondary | ICD-10-CM | POA: Diagnosis present

## 2024-05-18 DIAGNOSIS — Z1152 Encounter for screening for COVID-19: Secondary | ICD-10-CM

## 2024-05-18 DIAGNOSIS — Z96643 Presence of artificial hip joint, bilateral: Secondary | ICD-10-CM | POA: Diagnosis present

## 2024-05-18 DIAGNOSIS — T380X5A Adverse effect of glucocorticoids and synthetic analogues, initial encounter: Secondary | ICD-10-CM | POA: Diagnosis present

## 2024-05-18 LAB — CBC WITH DIFFERENTIAL/PLATELET
Abs Immature Granulocytes: 0.1 K/uL — ABNORMAL HIGH (ref 0.00–0.07)
Basophils Absolute: 0 K/uL (ref 0.0–0.1)
Basophils Relative: 0 %
Eosinophils Absolute: 0 K/uL (ref 0.0–0.5)
Eosinophils Relative: 0 %
HCT: 38.9 % (ref 36.0–46.0)
Hemoglobin: 11.6 g/dL — ABNORMAL LOW (ref 12.0–15.0)
Immature Granulocytes: 1 %
Lymphocytes Relative: 10 %
Lymphs Abs: 1 K/uL (ref 0.7–4.0)
MCH: 22.8 pg — ABNORMAL LOW (ref 26.0–34.0)
MCHC: 29.8 g/dL — ABNORMAL LOW (ref 30.0–36.0)
MCV: 76.6 fL — ABNORMAL LOW (ref 80.0–100.0)
Monocytes Absolute: 0.7 K/uL (ref 0.1–1.0)
Monocytes Relative: 7 %
Neutro Abs: 8.5 K/uL — ABNORMAL HIGH (ref 1.7–7.7)
Neutrophils Relative %: 82 %
Platelets: 333 K/uL (ref 150–400)
RBC: 5.08 MIL/uL (ref 3.87–5.11)
RDW: 18 % — ABNORMAL HIGH (ref 11.5–15.5)
WBC: 10.4 K/uL (ref 4.0–10.5)
nRBC: 0.2 % (ref 0.0–0.2)

## 2024-05-18 LAB — COMPREHENSIVE METABOLIC PANEL WITH GFR
ALT: 11 U/L (ref 0–44)
AST: 14 U/L — ABNORMAL LOW (ref 15–41)
Albumin: 3.5 g/dL (ref 3.5–5.0)
Alkaline Phosphatase: 108 U/L (ref 38–126)
Anion gap: 12 (ref 5–15)
BUN: 35 mg/dL — ABNORMAL HIGH (ref 8–23)
CO2: 32 mmol/L (ref 22–32)
Calcium: 9 mg/dL (ref 8.9–10.3)
Chloride: 93 mmol/L — ABNORMAL LOW (ref 98–111)
Creatinine, Ser: 1.45 mg/dL — ABNORMAL HIGH (ref 0.44–1.00)
GFR, Estimated: 39 mL/min — ABNORMAL LOW
Glucose, Bld: 305 mg/dL — ABNORMAL HIGH (ref 70–99)
Potassium: 4.6 mmol/L (ref 3.5–5.1)
Sodium: 137 mmol/L (ref 135–145)
Total Bilirubin: 0.4 mg/dL (ref 0.0–1.2)
Total Protein: 8.4 g/dL — ABNORMAL HIGH (ref 6.5–8.1)

## 2024-05-18 LAB — URINALYSIS, W/ REFLEX TO CULTURE (INFECTION SUSPECTED)
Bilirubin Urine: NEGATIVE
Glucose, UA: >=500 mg/dL — AB
Ketones, ur: NEGATIVE mg/dL
Nitrite: NEGATIVE
Protein, ur: NEGATIVE mg/dL
Specific Gravity, Urine: 1.01 (ref 1.005–1.030)
pH: 5 (ref 5.0–8.0)

## 2024-05-18 LAB — RESP PANEL BY RT-PCR (RSV, FLU A&B, COVID)  RVPGX2
Influenza A by PCR: NEGATIVE
Influenza B by PCR: NEGATIVE
Resp Syncytial Virus by PCR: NEGATIVE
SARS Coronavirus 2 by RT PCR: NEGATIVE

## 2024-05-18 LAB — BLOOD GAS, VENOUS
Acid-Base Excess: 11.1 mmol/L — ABNORMAL HIGH (ref 0.0–2.0)
Bicarbonate: 37.4 mmol/L — ABNORMAL HIGH (ref 20.0–28.0)
O2 Saturation: 86.9 %
Patient temperature: 37
pCO2, Ven: 55 mmHg (ref 44–60)
pH, Ven: 7.44 — ABNORMAL HIGH (ref 7.25–7.43)
pO2, Ven: 57 mmHg — ABNORMAL HIGH (ref 32–45)

## 2024-05-18 LAB — TROPONIN T, HIGH SENSITIVITY
Troponin T High Sensitivity: 19 ng/L (ref 0–19)
Troponin T High Sensitivity: 16 ng/L (ref 0–19)

## 2024-05-18 LAB — CBC
HCT: 35 % — ABNORMAL LOW (ref 36.0–46.0)
Hemoglobin: 10.8 g/dL — ABNORMAL LOW (ref 12.0–15.0)
MCH: 23.3 pg — ABNORMAL LOW (ref 26.0–34.0)
MCHC: 30.9 g/dL (ref 30.0–36.0)
MCV: 75.4 fL — ABNORMAL LOW (ref 80.0–100.0)
Platelets: 326 K/uL (ref 150–400)
RBC: 4.64 MIL/uL (ref 3.87–5.11)
RDW: 18.2 % — ABNORMAL HIGH (ref 11.5–15.5)
WBC: 11.9 K/uL — ABNORMAL HIGH (ref 4.0–10.5)
nRBC: 0.3 % — ABNORMAL HIGH (ref 0.0–0.2)

## 2024-05-18 LAB — CREATININE, SERUM
Creatinine, Ser: 1.41 mg/dL — ABNORMAL HIGH (ref 0.44–1.00)
GFR, Estimated: 40 mL/min — ABNORMAL LOW

## 2024-05-18 LAB — PROCALCITONIN: Procalcitonin: 0.35 ng/mL

## 2024-05-18 LAB — PROTIME-INR
INR: 1.1 (ref 0.8–1.2)
Prothrombin Time: 14.7 s (ref 11.4–15.2)

## 2024-05-18 LAB — PRO BRAIN NATRIURETIC PEPTIDE: Pro Brain Natriuretic Peptide: 2771 pg/mL — ABNORMAL HIGH

## 2024-05-18 LAB — LACTIC ACID, PLASMA: Lactic Acid, Venous: 1.2 mmol/L (ref 0.5–1.9)

## 2024-05-18 MED ORDER — POLYETHYLENE GLYCOL 3350 17 G PO PACK
17.0000 g | PACK | Freq: Every day | ORAL | Status: DC
  Administered 2024-05-20: 17 g via ORAL
  Filled 2024-05-18 (×3): qty 1

## 2024-05-18 MED ORDER — SODIUM CHLORIDE 0.9 % IV SOLN
2.0000 g | Freq: Once | INTRAVENOUS | Status: AC
  Administered 2024-05-18: 2 g via INTRAVENOUS
  Filled 2024-05-18: qty 20

## 2024-05-18 MED ORDER — ACETAMINOPHEN 650 MG RE SUPP: 650.0000 mg | Freq: Four times a day (QID) | RECTAL | Status: DC | PRN

## 2024-05-18 MED ORDER — IPRATROPIUM-ALBUTEROL 0.5-2.5 (3) MG/3ML IN SOLN
3.0000 mL | Freq: Once | RESPIRATORY_TRACT | Status: AC
  Administered 2024-05-18: 3 mL via RESPIRATORY_TRACT
  Filled 2024-05-18: qty 3

## 2024-05-18 MED ORDER — METHYLPREDNISOLONE SODIUM SUCC 125 MG IJ SOLR
125.0000 mg | Freq: Once | INTRAMUSCULAR | Status: AC
  Administered 2024-05-18: 125 mg via INTRAVENOUS
  Filled 2024-05-18: qty 2

## 2024-05-18 MED ORDER — ALBUTEROL SULFATE (2.5 MG/3ML) 0.083% IN NEBU: 2.5000 mg | INHALATION_SOLUTION | RESPIRATORY_TRACT | Status: DC | PRN

## 2024-05-18 MED ORDER — ACETAMINOPHEN 325 MG PO TABS
650.0000 mg | ORAL_TABLET | Freq: Four times a day (QID) | ORAL | Status: DC | PRN
  Administered 2024-05-19: 650 mg via ORAL
  Filled 2024-05-18 (×2): qty 2

## 2024-05-18 MED ORDER — FUROSEMIDE 10 MG/ML IJ SOLN
40.0000 mg | Freq: Two times a day (BID) | INTRAMUSCULAR | Status: AC
  Administered 2024-05-18 – 2024-05-20 (×3): 40 mg via INTRAVENOUS
  Filled 2024-05-18 (×3): qty 4

## 2024-05-18 MED ORDER — BISACODYL 5 MG PO TBEC
5.0000 mg | DELAYED_RELEASE_TABLET | Freq: Every day | ORAL | Status: DC | PRN
  Administered 2024-05-20: 5 mg via ORAL
  Filled 2024-05-18: qty 1

## 2024-05-18 MED ORDER — NICOTINE 7 MG/24HR TD PT24
7.0000 mg | MEDICATED_PATCH | Freq: Every day | TRANSDERMAL | Status: DC | PRN
  Administered 2024-05-19 – 2024-05-20 (×2): 7 mg via TRANSDERMAL
  Filled 2024-05-18 (×4): qty 1

## 2024-05-18 MED ORDER — IPRATROPIUM-ALBUTEROL 0.5-2.5 (3) MG/3ML IN SOLN
6.0000 mL | Freq: Once | RESPIRATORY_TRACT | Status: AC
  Administered 2024-05-18: 6 mL via RESPIRATORY_TRACT
  Filled 2024-05-18: qty 6

## 2024-05-18 MED ORDER — ONDANSETRON HCL 4 MG PO TABS: 4.0000 mg | ORAL_TABLET | Freq: Four times a day (QID) | ORAL | Status: DC | PRN

## 2024-05-18 MED ORDER — FUROSEMIDE 10 MG/ML IJ SOLN
40.0000 mg | Freq: Once | INTRAMUSCULAR | Status: AC
  Administered 2024-05-18: 40 mg via INTRAVENOUS
  Filled 2024-05-18: qty 4

## 2024-05-18 MED ORDER — BUDESONIDE 0.5 MG/2ML IN SUSP
0.5000 mg | Freq: Two times a day (BID) | RESPIRATORY_TRACT | Status: DC
  Administered 2024-05-18 – 2024-05-21 (×6): 0.5 mg via RESPIRATORY_TRACT
  Filled 2024-05-18 (×6): qty 2

## 2024-05-18 MED ORDER — IPRATROPIUM-ALBUTEROL 0.5-2.5 (3) MG/3ML IN SOLN: 3.0000 mL | RESPIRATORY_TRACT | Status: DC | PRN

## 2024-05-18 MED ORDER — SODIUM CHLORIDE 0.9 % IV SOLN
500.0000 mg | Freq: Once | INTRAVENOUS | Status: AC
  Administered 2024-05-18: 500 mg via INTRAVENOUS
  Filled 2024-05-18: qty 5

## 2024-05-18 MED ORDER — ONDANSETRON HCL 4 MG/2ML IJ SOLN
4.0000 mg | Freq: Four times a day (QID) | INTRAMUSCULAR | Status: DC | PRN
  Administered 2024-05-20: 4 mg via INTRAVENOUS
  Filled 2024-05-18: qty 2

## 2024-05-18 MED ORDER — ENOXAPARIN SODIUM 60 MG/0.6ML IJ SOSY
0.5000 mg/kg | PREFILLED_SYRINGE | INTRAMUSCULAR | Status: DC
  Administered 2024-05-18: 50 mg via SUBCUTANEOUS
  Filled 2024-05-18 (×3): qty 0.6

## 2024-05-18 MED ORDER — OXYCODONE HCL 5 MG PO TABS
10.0000 mg | ORAL_TABLET | Freq: Four times a day (QID) | ORAL | Status: DC | PRN
  Administered 2024-05-18 – 2024-05-20 (×5): 10 mg via ORAL
  Filled 2024-05-18 (×5): qty 2

## 2024-05-18 NOTE — Progress Notes (Signed)
 PHARMACIST - PHYSICIAN COMMUNICATION  CONCERNING:  Enoxaparin  (Lovenox ) for DVT Prophylaxis   ASSESSMENT: Patient was prescribed enoxaparin  40 mg subcutaneously every 24 hours for VTE prophylaxis.   Body mass index is 36.9 kg/m.  Estimated Creatinine Clearance: 42.1 mL/min (A) (by C-G formula based on SCr of 1.45 mg/dL (H)).  Based on [facility] policy, patient qualifies for enoxaparin  dosing of 0.5 mg per kilogram of total body weight every 24 hours because their body mass index is >30 kg/m2.  PLAN: Pharmacy has adjusted enoxaparin  dose per [facility] policy.  Description: Patient is now receiving enoxaparin  50 mg subcutaneously every 24 hours.  Will M. Lenon, PharmD, BCPS Clinical Pharmacist 05/18/2024 5:27 PM

## 2024-05-18 NOTE — ED Notes (Signed)
 One set cultures obtained prior to antibiotic administration. Pt stated she could only be stuck once. Second set not obtained.

## 2024-05-18 NOTE — H&P (Signed)
 " History and Physical  Deanna Gay FMW:[id number] DOB: 1955/06/22 DOA: 05/18/2024 PCP: Lenon Layman ORN, MD  Chief Complaint: nausea Historian: patient  HPI:  Deanna Gay is a 68 y.o. female with a PMH significant for iron  deficiency anemia, sleep apnea, HLD, COPD, schizophrenia, tobacco use disorder. At baseline, they live independently and are independent with their ADLs.  They presented from home to the ED on 05/18/2024 with shortness of breath worsening x several days.  She states that she has an albuterol  inhaler and nebulizer at home which she has been using at increased frequency over the past couple of days.  Endorses some upper respiratory viral symptoms such as cough, rhinorrhea, congestion.  She has low appetite. Currently, her biggest complaint is nausea and continues to repeat that she is nauseated and will not further discuss her HPI.  In the ED, it was found that they had new oxygen requirement of 6 L nasal cannula to maintain sats greater than 90%, she was 80% on room air.  Respiratory rate 20, heart rate 96, blood pressure 108/57.  Significant findings included: WBC 10.4, hemoglobin 11.6.  LA 1.2.  Na+ 137, K+ 4.6, creatinine 1.45 (baseline 1.2), glucose 305.  Initial troponin 19.  proBNP 2771.  Negative for COVID, influenza, RSV Chest x-ray showed stable cardiomegaly with mild central pulmonary vascular congestion.   They were initially treated with Lasix , DuoNebs, methylprednisolone , azithromycin , ceftriaxone  Patient was admitted to medicine service for further workup and management of hypoxia as outlined in detail below.  Assessment/Plan Principal Problem:   Hypoxia   COPD exacerbation Acute respiratory failure with hypoxia-no baseline oxygen use, now requiring 6 L.  Does have history of COPD with as needed albuterol  inhaler.  Also tobacco dependency. Suspect multifactorial etiology including CHF exacerbation due to the pulmonary edema and elevated  BNP.  In 2019, she had an echo showing grade 1 diastolic dysfunction and otherwise trivial results. as well as COPD exacerbation given the extensive wheezing appreciated on exam and expressed improvement with bronchodilator treatments.  She has no fever, leukocytosis or consolidations on chest x-ray.  Less likely pneumonia.  Will rule out bacterial secondary infection with procalcitonin and not to continue antibiotics currently.  She is s/p azithromycin  and ceftriaxone in the ED. - RVP swab - Bronchodilator treatments as needed - Budesonide  inhalers - Continue prednisone  p.o. - IV Lasix  - Strict I's/O, daily weights - Obtain echo - Wean O2 as tolerated - 1 set of blood cultures were collected, will follow  Nausea-patient tolerating liquids.  Possible side effect of antibiotics versus viral infection - Zofran  as needed  Anemia, iron  deficient-hemoglobin appears to be at baseline 11.6 on admission.  Unlikely contributing to shortness of breath  Sleep apnea-unclear if patient uses CPAP at night.  Will continue on high flow oxygen at this time  Schizophrenia-appears patient has not been taking a lot of her medications.  Will await medication reconciliation prior to restarting home medications at this time  Past Medical History:  Diagnosis Date   Anemia    Anxiety    Aortic atherosclerosis    Arthritis    Bilateral carpal tunnel syndrome    Cancer (HCC)    cervical   Chronic undifferentiated schizophrenia (HCC)    COPD (chronic obstructive pulmonary disease) (HCC)    COPD exacerbation (HCC) 07/19/2017   Depression    Diabetes mellitus without complication (HCC)    GERD (gastroesophageal reflux disease)    Hyperlipidemia    Hypertension  Iron  deficiency anemia    Schizophrenia (HCC)    Sleep apnea    Spinal stenosis of lumbar region with neurogenic claudication     Past Surgical History:  Procedure Laterality Date   APPENDECTOMY     COLONOSCOPY N/A 01/24/2021   Procedure:  COLONOSCOPY;  Surgeon: Onita Elspeth Sharper, DO;  Location: [facility] ENDOSCOPY;  Service: Gastroenterology;  Laterality: N/A;   COLONOSCOPY N/A 09/28/2023   Procedure: COLONOSCOPY;  Surgeon: Onita Elspeth Sharper, DO;  Location: [facility] ENDOSCOPY;  Service: Gastroenterology;  Laterality: N/A;   COLONOSCOPY WITH PROPOFOL  N/A 12/25/2014   Procedure: COLONOSCOPY WITH PROPOFOL ;  Surgeon: Donnice Vaughn Manes, MD;  Location: [facility] ENDOSCOPY;  Service: Endoscopy;  Laterality: N/A;   COLONOSCOPY WITH PROPOFOL  N/A 01/21/2018   Procedure: COLONOSCOPY WITH PROPOFOL ;  Surgeon: Unk Corinn Skiff, MD;  Location: [facility] ENDOSCOPY;  Service: Gastroenterology;  Laterality: N/A;   ESOPHAGOGASTRODUODENOSCOPY N/A 01/24/2021   Procedure: ESOPHAGOGASTRODUODENOSCOPY (EGD);  Surgeon: Onita Elspeth Sharper, DO;  Location: [facility] ENDOSCOPY;  Service: Gastroenterology;  Laterality: N/A;   ESOPHAGOGASTRODUODENOSCOPY N/A 09/28/2023   Procedure: EGD (ESOPHAGOGASTRODUODENOSCOPY);  Surgeon: Onita Elspeth Sharper, DO;  Location: [facility] ENDOSCOPY;  Service: Gastroenterology;  Laterality: N/A;   ESOPHAGOGASTRODUODENOSCOPY (EGD) WITH PROPOFOL  N/A 12/25/2014   Procedure: ESOPHAGOGASTRODUODENOSCOPY (EGD) WITH PROPOFOL ;  Surgeon: Donnice Vaughn Manes, MD;  Location: [facility] ENDOSCOPY;  Service: Endoscopy;  Laterality: N/A;   ESOPHAGOGASTRODUODENOSCOPY (EGD) WITH PROPOFOL  N/A 01/21/2018   Procedure: ESOPHAGOGASTRODUODENOSCOPY (EGD) WITH PROPOFOL ;  Surgeon: Unk Corinn Skiff, MD;  Location: [facility] ENDOSCOPY;  Service: Gastroenterology;  Laterality: N/A;   GIVENS CAPSULE STUDY N/A 03/02/2018   Procedure: GIVENS CAPSULE STUDY;  Surgeon: Unk Corinn Skiff, MD;  Location: [facility] ENDOSCOPY;  Service: Gastroenterology;  Laterality: N/A;   JOINT REPLACEMENT     right hip   POLYPECTOMY  09/28/2023   Procedure: POLYPECTOMY, INTESTINE;  Surgeon: Onita Elspeth Sharper, DO;  Location: [facility] ENDOSCOPY;  Service: Gastroenterology;;   TOTAL HIP ARTHROPLASTY Left 05/16/2016    Procedure: LEFT TOTAL HIP ARTHROPLASTY ANTERIOR APPROACH;  Surgeon: Lonni CINDERELLA Poli, MD;  Location: [facility] ORS;  Service: Orthopedics;  Laterality: Left;     reports that she has been smoking cigarettes. She has a 48 pack-year smoking history. She has never used smokeless tobacco. She reports that she does not drink alcohol and does not use drugs.  Allergies[1]  Family History  Problem Relation Age of Onset   Hypertension Mother    Diabetes Mellitus II Mother    Breast cancer Neg Hx    Kidney cancer Neg Hx    Bladder Cancer Neg Hx     Prior to Admission medications  Medication Sig Start Date End Date Taking? Authorizing Provider  albuterol  (PROVENTIL  HFA;VENTOLIN  HFA) 108 (90 BASE) MCG/ACT inhaler Inhale 2 puffs into the lungs every 4 (four) hours as needed for wheezing or shortness of breath.   Yes [provider]  atorvastatin (LIPITOR) 40 MG tablet Take 40 mg by mouth daily.   Yes [provider]  budesonide -formoterol  (SYMBICORT ) 160-4.5 MCG/ACT inhaler Inhale 2 puffs into the lungs 2 (two) times daily.   Yes [provider]  Calcium  Carb-Cholecalciferol  600-400 MG-UNIT TABS Take 1 tablet by mouth daily.   Yes [provider]  calcium  carbonate (OSCAL) 1500 (600 Ca) MG TABS tablet Take by mouth 2 (two) times daily with a meal.   Yes [provider]  celecoxib (CELEBREX) 200 MG capsule Take 200 mg by mouth every 12 (twelve) hours.   Yes [provider]  cyclobenzaprine  (FLEXERIL ) 10 MG  tablet Take 10 mg by mouth at bedtime. 02/07/22  Yes [provider]  diltiazem  (CARDIZEM  CD) 180 MG 24 hr capsule Take 180 mg by mouth at bedtime.   Yes [provider]  diltiazem  (CARDIZEM ) 60 MG tablet Take tablet by mouth TWO hours prior to your cardiac CT scan. 03/12/23  Yes Darliss Rogue, MD  diltiazem  (TIAZAC ) 180 MG 24 hr capsule Take by mouth. 10/10/20  Yes [provider]  docusate sodium  (COLACE) 100 MG  capsule Take 100 mg by mouth 2 (two) times daily.   Yes [provider]  doxycycline  (VIBRA -TABS) 100 MG tablet Take 100 mg by mouth. 05/13/24 05/20/24 Yes [provider]  escitalopram  (LEXAPRO ) 10 MG tablet Take 10 mg by mouth daily. 05/09/20  Yes [provider]  fexofenadine (ALLEGRA) 180 MG tablet Take 180 mg by mouth daily.   Yes [provider]  fluconazole (DIFLUCAN) 100 MG tablet Take 100 mg by mouth daily. 05/09/24  Yes [provider]  fluticasone  (FLONASE) 50 MCG/ACT nasal spray Place 2 sprays into both nostrils daily.   Yes [provider]  furosemide  (LASIX ) 20 MG tablet Take 20 mg by mouth daily. 02/22/20  Yes [provider]  insulin  glargine (LANTUS ) 100 UNIT/ML injection Inject 0.1 mLs (10 Units total) into the skin at bedtime. 11/19/17  Yes Barbette Cea, MD  insulin  lispro (HUMALOG ) 100 UNIT/ML injection Please check your sugars before each meal and use lispro per the below sliding scale:  For sugars 151-200: take 2 units For 201-250: take 4units For 251-300: take 6 units For 301-350: take 8 units For 351-400: take 10 units For > 401: call MD 07/20/17  Yes Sherial Bail, MD  iron  polysaccharides (NIFEREX) 150 MG capsule Take 1 capsule (150 mg total) by mouth 2 (two) times daily. 04/12/24  Yes Dasie Tinnie MATSU, NP  JARDIANCE 10 MG TABS tablet Take 10 mg by mouth daily.   Yes [provider]  losartan  (COZAAR ) 50 MG tablet Take 50 mg by mouth daily. 02/25/20  Yes [provider]  Magnesium  250 MG TABS Take by mouth daily.   Yes [provider]  meloxicam  (MOBIC ) 15 MG tablet Take 15 mg by mouth daily. 04/27/20  Yes [provider]  metoprolol  succinate (TOPROL -XL) 25 MG 24 hr tablet Take 25 mg by mouth daily. 05/25/20  Yes [provider]  montelukast (SINGULAIR) 10 MG tablet Take 10 mg by mouth at bedtime. 05/14/20  Yes [provider]  MOUNJARO 5 MG/0.5ML Pen Inject  5 mg into the skin once a week. 02/12/23  Yes [provider]  Multiple Vitamins-Minerals (ZINC  PO) Take by mouth.   Yes [provider]  mupirocin  ointment (BACTROBAN ) 2 % Apply 1 Application topically 2 (two) times daily. Apply to wounds QD. 04/07/23  Yes Hester Alm BROCKS, MD  Naloxone HCl (EVZIO) 0.4 MG/0.4ML SOAJ Inject as directed.   Yes [provider]  nitroGLYCERIN  (NITROSTAT ) 0.4 MG SL tablet Place 1 tablet under the tongue every 5 (five) minutes as needed. 08/17/17  Yes [provider]  omeprazole (PRILOSEC) 40 MG capsule Take 40 mg by mouth 2 (two) times daily. 01/08/21  Yes [provider]  Oxycodone  HCl 10 MG TABS Take 10 mg by mouth every 6 (six) hours. 08/04/22  Yes [provider]  oxyCODONE -acetaminophen  (PERCOCET) 7.5-325 MG tablet Take 1-2 tablets by mouth every 6 (six) hours as needed for severe pain. 05/18/16  Yes Vernetta Lonni GRADE, MD  potassium chloride  (  KLOR-CON ) 10 MEQ tablet Take 10 mEq by mouth daily.   Yes [provider]  predniSONE  (DELTASONE ) 10 MG tablet Label  & dispense according to the schedule below. 5 Pills PO for 1 day then, 4 Pills PO for 1 day, 3 Pills PO for 1 day, 2 Pills PO for 1 day, 1 Pill PO for 1 days then STOP. 05/16/18  Yes Sainani, Vivek J, MD  predniSONE  (DELTASONE ) 50 MG tablet Take table by mouth 13 hours, 7 hours, and 1 hour prior to your cardiac CT scan. 03/12/23  Yes Darliss Rogue, MD  pregabalin  (LYRICA ) 25 MG capsule Take 25 mg by mouth 2 (two) times daily.   Yes [provider]  roflumilast (DALIRESP) 500 MCG TABS tablet Take 500 mcg by mouth daily.   Yes [provider]  tiotropium (SPIRIVA) 18 MCG inhalation capsule Place 18 mcg into inhaler and inhale daily.   Yes [provider]  triamcinolone  (KENALOG ) 0.1 % paste Use as directed 1 application  in the mouth or throat 2 (two) times daily.   Yes [provider]  TRULICITY 0.75 MG/0.5ML  SOPN Inject 0.75 mg into the skin once a week. 12/13/20  Yes [provider]  vitamin B-12 (CYANOCOBALAMIN ) 500 MCG tablet Take 500 mcg by mouth daily.   Yes [provider]  ACCU-CHEK AVIVA PLUS test strip use 1 TEST STRIP to TEST BLOOD SUGAR four times a day Patient not taking: Reported on 05/17/2024 12/17/17   [provider]  Blood Glucose Monitoring Suppl (ACCU-CHEK AVIVA PLUS) w/Device KIT See admin instructions. Patient not taking: Reported on 05/17/2024 12/19/17   [provider]  Insulin  Syringe-Needle U-100 31G X 5/16 0.5 ML MISC use as directed once daily Patient not taking: Reported on 05/17/2024 06/25/17   [provider]  ipratropium-albuterol  (DUONEB) 0.5-2.5 (3) MG/3ML SOLN Take 3 mLs by nebulization every 6 (six) hours as needed. Patient not taking: Reported on 05/17/2024    [provider]  metoprolol  tartrate (LOPRESSOR ) 100 MG tablet Take 1 tablet (100 mg total) by mouth once for 1 dose. Please take one time dose 100mg  metoprolol  tartrate 2 hr prior to cardiac CT for HR control IF HR >55bpm. Patient not taking: Reported on 05/17/2024 02/27/23 01/28/24  Darliss Rogue, MD   I have personally, briefly reviewed patient's prior medical records in [facility]  Objective: Blood pressure 107/65, pulse 92, temperature 98.3 F (36.8 C), temperature source Oral, resp. rate 18, height 5' 4 (1.626 m), weight 97.5 kg, SpO2 93%.   Constitutional: NAD, calm, comfortable HEENT: lids and conjunctivae normal. MMM. Posterior pharynx clear of any exudate or lesions. Normal dentition.  Neck: normal, supple, no masses, no thyromegaly Respiratory: significant inspiratory wheezing throughout. No crackles. Normal work of breathing.  Cardiovascular: RRR, no murmurs / rubs / gallops. No extremity edema. 2+ pedal pulses. no clubbing / cyanosis.  Abdomen: soft, NT, ND, no masses or HSM palpated. Musculoskeletal: No joint deformity upper and  lower extremities. Normal muscle tone.  Skin: dry, intact, normal color, normal temperature on exposed skin Neurologic: Alert and oriented x 3. Normal speech. Grossly non-focal exam. PERRL Psychiatric: Normal mood. Congruent affect.  Labs on Admission: I have personally reviewed admission labs and imaging studies  CBC    Component Value Date/Time   WBC 10.4 05/18/2024 1512   RBC 5.08 05/18/2024 1512   HGB 11.6 (L) 05/18/2024 1512   HGB 12.5 02/17/2023 0812   HGB 12.0 07/21/2013 1338   HCT 38.9  05/18/2024 1512   HCT 36.5 07/21/2013 1338   PLT 333 05/18/2024 1512   PLT 248 02/17/2023 0812   PLT 246 07/21/2013 1338   MCV 76.6 (L) 05/18/2024 1512   MCV 78 (L) 07/21/2013 1338   MCH 22.8 (L) 05/18/2024 1512   MCHC 29.8 (L) 05/18/2024 1512   RDW 18.0 (H) 05/18/2024 1512   RDW 17.5 (H) 07/21/2013 1338   LYMPHSABS 1.0 05/18/2024 1512   LYMPHSABS 2.4 07/21/2013 1338   MONOABS 0.7 05/18/2024 1512   MONOABS 0.4 07/21/2013 1338   EOSABS 0.0 05/18/2024 1512   EOSABS 0.0 07/21/2013 1338   BASOSABS 0.0 05/18/2024 1512   BASOSABS 0.1 07/21/2013 1338   CMP     Component Value Date/Time   NA 137 05/18/2024 1512   NA 138 07/21/2013 1338   K 4.6 05/18/2024 1512   K 4.0 07/21/2013 1338   CL 93 (L) 05/18/2024 1512   CL 105 07/21/2013 1338   CO2 32 05/18/2024 1512   CO2 31 07/21/2013 1338   GLUCOSE 305 (H) 05/18/2024 1512   GLUCOSE 144 (H) 07/21/2013 1338   BUN 35 (H) 05/18/2024 1512   BUN 9 07/21/2013 1338   CREATININE 1.45 (H) 05/18/2024 1512   CREATININE 0.71 07/21/2013 1338   CALCIUM  9.0 05/18/2024 1512   CALCIUM  8.4 (L) 07/21/2013 1338   PROT 8.4 (H) 05/18/2024 1512   PROT 7.2 07/28/2018 0853   ALBUMIN 3.5 05/18/2024 1512   ALBUMIN 3.7 (L) 07/28/2018 0853   AST 14 (L) 05/18/2024 1512   ALT 11 05/18/2024 1512   ALKPHOS 108 05/18/2024 1512   BILITOT 0.4 05/18/2024 1512   BILITOT <0.2 07/28/2018 0853   GFRNONAA 39 (L) 05/18/2024 1512   GFRNONAA >60 07/21/2013 1338   GFRAA  49 (L) 10/31/2019 1019   GFRAA >60 07/21/2013 1338    Radiological Exams on Admission: DG Chest Port 1 View if patient is in a treatment room. Result Date: 05/18/2024 CLINICAL DATA:  Sepsis EXAM: PORTABLE CHEST 1 VIEW COMPARISON:  June 05, 2020 FINDINGS: Stable cardiomegaly with mild central pulmonary vascular congestion. No definite consolidative process is noted. Degenerative changes are seen involving both shoulders. IMPRESSION: Stable cardiomegaly with mild central pulmonary vascular congestion. Electronically Signed   By: Lynwood Landy Raddle M.D.   On: 05/18/2024 15:09   EKG: Independently reviewed. Sinus tachycardia   DVT prophylaxis: lovenox   Code Status: full  Family Communication: none at bedside   Disposition Plan: admit to med-surg obv  Consults called: none    Marien LITTIE Piety, DO [facility]  05/18/2024, 6:22 PM    To contact the appropriate [facility] Attending or Consulting provider: Check [email or web address] for coverage from 7pm-7am     [1]  Allergies Allergen Reactions   Penicillins Swelling    Has patient had a PCN reaction causing immediate rash, facial/tongue/throat swelling, SOB or lightheadedness with hypotension: Yes Has patient had a PCN reaction causing severe rash involving mucus membranes or skin necrosis: Yes Has patient had a PCN reaction that required hospitalization: No Has patient had a PCN reaction occurring within the last 10 years: No If all of the above answers are NO, then may proceed with Cephalosporin use.  Has patient had a PCN reaction causing immediate rash, facial/tongue/throat swelling, SOB or lightheadedness with hypotension: Yes Has patient had a PCN reaction causing severe rash involving mucus membranes or skin necrosis: Yes Has patient had a PCN reaction that required hospitalization: No Has patient had a PCN reaction occurring within the last 10  years: No If all of the above answers are NO, then may proceed with Cephalosporin  use. Has patient had a PCN reaction causing immediate rash, faci   Aspirin  Other (See Comments)    Ongoing anemia   Iodinated Contrast Media Swelling   Iodine Swelling   "

## 2024-05-18 NOTE — Consult Note (Signed)
 CODE SEPSIS - PHARMACY COMMUNICATION  **Broad Spectrum Antibiotics should be administered within 1 hour of Sepsis diagnosis**  Time Code Sepsis Called/Page Received: 1557  Antibiotics Ordered: azithromycin , ceftrixone  Time of 1st antibiotic administration: 1623  Additional action taken by pharmacy: n/a  If necessary, Name of Provider/Nurse Contacted: n/a    Eliab Closson A Idriss Quackenbush ,PharmD Clinical Pharmacist  05/18/2024  3:58 PM

## 2024-05-18 NOTE — Sepsis Progress Note (Signed)
 Elink will follow per sepsis protocol.

## 2024-05-18 NOTE — ED Triage Notes (Signed)
 Pt presents to ED from home C/O SOB, reports low oxygen and low blood [facility]. Pt states she didn't come yesterday because she didn't feel good.   SaO2 80% in triage on home O2 3L [place]. Placed on 6L [place] in triage.

## 2024-05-18 NOTE — ED Provider Notes (Signed)
 "  [facility] Provider Note    Event Date/Time   First MD Initiated Contact with Patient 05/18/24 1501     (approximate)   History   Chief Complaint Shortness of Breath   HPI  Deanna Gay is a 68 y.o. female with past medical history of hypertension, hyperlipidemia, diabetes, COPD, IDA, and schizophrenia who presents to the ED complaining of shortness of breath.  Patient reports that she has been feeling unwell for about the past week with productive cough and increasing difficulty breathing.  She states that she went to the cancer center yesterday for her iron  infusion, was told her oxygen levels were low at that time.  She reports wearing oxygen at night for her COPD, has been increasing the amount of oxygen in the past couple of nights due to difficulty breathing.  She denies any pain in her chest and has not noticed significant swelling in her legs.  She denies any nausea or vomiting and is not aware of any sick contacts.     Physical Exam   Triage Vital Signs: ED Triage Vitals  Encounter Vitals Group     BP 05/18/24 1432 97/64     Girls Systolic BP Percentile --      Girls Diastolic BP Percentile --      Boys Systolic BP Percentile --      Boys Diastolic BP Percentile --      Pulse Rate 05/18/24 1432 (!) 118     Resp 05/18/24 1432 (!) 28     Temp 05/18/24 1432 99.2 F (37.3 C)     Temp Source 05/18/24 1432 Oral     SpO2 05/18/24 1431 (S) (!) 80 %     Weight 05/18/24 1433 215 lb (97.5 kg)     Height 05/18/24 1433 5' 4 (1.626 m)     Head Circumference --      Peak Flow --      Pain Score 05/18/24 1433 2     Pain Loc --      Pain Education --      Exclude from Growth Chart --     Most recent vital signs: Vitals:   05/18/24 1500 05/18/24 1710  BP: (!) 108/57 (!) 104/56  Pulse: 96 93  Resp:  (!) 22  Temp:    SpO2: 92% 93%    Constitutional: Alert and oriented. Eyes: Conjunctivae are normal. Head: Atraumatic. Nose: No  congestion/rhinnorhea. Mouth/Throat: Mucous membranes are moist.  Cardiovascular: Tachycardic, regular rhythm. Grossly normal heart sounds.  2+ radial pulses bilaterally. Respiratory: Tachypneic with mildly increased work of breathing, inspiratory expiratory wheezing noted throughout. Gastrointestinal: Soft and nontender. No distention. Musculoskeletal: No lower extremity tenderness nor edema.  Neurologic:  Normal speech and language. No gross focal neurologic deficits are appreciated.    ED Results / Procedures / Treatments   Labs (all labs ordered are listed, but only abnormal results are displayed) Labs Reviewed  COMPREHENSIVE METABOLIC PANEL WITH GFR - Abnormal; Notable for the following components:      Result Value   Chloride 93 (*)    Glucose, Bld 305 (*)    BUN 35 (*)    Creatinine, Ser 1.45 (*)    Total Protein 8.4 (*)    AST 14 (*)    GFR, Estimated 39 (*)    All other components within normal limits  CBC WITH DIFFERENTIAL/PLATELET - Abnormal; Notable for the following components:   Hemoglobin 11.6 (*)    MCV 76.6 (*)  MCH 22.8 (*)    MCHC 29.8 (*)    RDW 18.0 (*)    Neutro Abs 8.5 (*)    Abs Immature Granulocytes 0.10 (*)    All other components within normal limits  URINALYSIS, W/ REFLEX TO CULTURE (INFECTION SUSPECTED) - Abnormal; Notable for the following components:   Color, Urine YELLOW (*)    APPearance HAZY (*)    Glucose, UA >=500 (*)    Hgb urine dipstick SMALL (*)    Leukocytes,Ua SMALL (*)    Bacteria, UA RARE (*)    All other components within normal limits  PRO BRAIN NATRIURETIC PEPTIDE - Abnormal; Notable for the following components:   Pro Brain Natriuretic Peptide 2,771.0 (*)    All other components within normal limits  BLOOD GAS, VENOUS - Abnormal; Notable for the following components:   pH, Ven 7.44 (*)    pO2, Ven 57 (*)    Bicarbonate 37.4 (*)    Acid-Base Excess 11.1 (*)    All other components within normal limits  RESP PANEL BY  RT-PCR (RSV, FLU A&B, COVID)  RVPGX2  CULTURE, BLOOD (ROUTINE X 2)  CULTURE, BLOOD (ROUTINE X 2)  URINE CULTURE  RESPIRATORY PANEL BY PCR  LACTIC ACID, PLASMA  PROTIME-INR  CBC  CREATININE, SERUM  BASIC METABOLIC PANEL WITH GFR  CBC  PROCALCITONIN  TROPONIN T, HIGH SENSITIVITY  TROPONIN T, HIGH SENSITIVITY     EKG  ED ECG REPORT I, Carlin Palin, the attending physician, personally viewed and interpreted this ECG.   Date: 05/18/2024  EKG Time: 14:35  Rate: 108  Rhythm: sinus tachycardia  Axis: LAD  Intervals:none  ST&T Change: None  RADIOLOGY Chest x-ray reviewed and interpreted by me with multifocal infiltrates concerning for pulmonary Dema versus infection.  PROCEDURES:  Critical Care performed: Yes, see critical care procedure note(s)  .Critical Care  Performed by: Palin Carlin, MD Authorized by: Palin Carlin, MD   Critical care provider statement:    Critical care time (minutes):  30   Critical care time was exclusive of:  Separately billable procedures and treating other patients and teaching time   Critical care was necessary to treat or prevent imminent or life-threatening deterioration of the following conditions:  Sepsis and respiratory failure   Critical care was time spent personally by me on the following activities:  Development of treatment plan with patient or surrogate, discussions with consultants, evaluation of patient's response to treatment, examination of patient, ordering and review of laboratory studies, ordering and review of radiographic studies, ordering and performing treatments and interventions, pulse oximetry, re-evaluation of patient's condition and review of old charts   I assumed direction of critical care for this patient from another provider in my specialty: no     Care discussed with: admitting provider      MEDICATIONS ORDERED IN ED: Medications  azithromycin  (ZITHROMAX ) 500 mg in sodium chloride  0.9 % 250 mL IVPB (500  mg Intravenous New Bag/Given 05/18/24 1717)  acetaminophen  (TYLENOL ) tablet 650 mg (has no administration in time range)    Or  acetaminophen  (TYLENOL ) suppository 650 mg (has no administration in time range)  polyethylene glycol (MIRALAX  / GLYCOLAX ) packet 17 g (has no administration in time range)  bisacodyl (DULCOLAX) EC tablet 5 mg (has no administration in time range)  ondansetron  (ZOFRAN ) tablet 4 mg (has no administration in time range)    Or  ondansetron  (ZOFRAN ) injection 4 mg (has no administration in time range)  albuterol  (PROVENTIL ) (2.5 MG/3ML) 0.083% nebulizer  solution 2.5 mg (has no administration in time range)  nicotine  (NICODERM CQ  - dosed in mg/24 hr) patch 7 mg (has no administration in time range)  enoxaparin  (LOVENOX ) injection 50 mg (has no administration in time range)  ipratropium-albuterol  (DUONEB) 0.5-2.5 (3) MG/3ML nebulizer solution 6 mL (6 mLs Nebulization Given 05/18/24 1541)  methylPREDNISolone  sodium succinate (SOLU-MEDROL ) 125 mg/2 mL injection 125 mg (125 mg Intravenous Given 05/18/24 1543)  cefTRIAXone (ROCEPHIN) 2 g in sodium chloride  0.9 % 100 mL IVPB (0 g Intravenous Stopped 05/18/24 1653)  furosemide  (LASIX ) injection 40 mg (40 mg Intravenous Given 05/18/24 1718)  ipratropium-albuterol  (DUONEB) 0.5-2.5 (3) MG/3ML nebulizer solution 3 mL (3 mLs Nebulization Given 05/18/24 1718)     IMPRESSION / MDM / ASSESSMENT AND PLAN / ED COURSE  I reviewed the triage vital signs and the nursing notes.                              68 y.o. female with past medical history of hypertension, hyperlipidemia, diabetes, COPD, IDA, and schizophrenia who presents to the ED with 1 week of general malaise, productive cough, and increasing difficulty breathing.  Patient's presentation is most consistent with acute presentation with potential threat to life or bodily function.  Differential diagnosis includes, but is not limited to, sepsis, pneumonia, COPD exacerbation, CHF,  anemia, electrolyte abnormality, AKI, influenza, COVID-19.  Patient ill-appearing and in some respiratory distress, noted to be hypoxic in triage but now improved on 6 L nasal cannula.  I do appreciate significant wheezing on exam, will treat with IV Solu-Medrol  and DuoNebs, also check VBG.  With her tachycardia and tachypnea with productive cough, and concern for sepsis and we will initiate IV antibiotics.  Her work of breathing does seem to be improved on 6 L nasal cannula, will attempt to wean on 4 L but low threshold to transition to BiPAP.  She denies any chest pain and EKG does not appear to have any ischemic changes, troponin pending.  BNP noted to be elevated and patient continues to require 6 L nasal cannula despite DuoNebs, will diurese with IV Lasix .  She does seem to have ongoing wheezing and will give additional DuoNeb, although her work of breathing seems to be improving.  Given reassuring VBG, will hold off on BiPAP.  Additional labs without significant anemia, leukocytosis, electrolyte abnormality, or AKI.  Troponin within normal limits and I doubt ACS or PE, case discussed with hospitalist for admission.      FINAL CLINICAL IMPRESSION(S) / ED DIAGNOSES   Final diagnoses:  COPD exacerbation (HCC)  Acute respiratory failure with hypoxia (HCC)     Rx / DC Orders   ED Discharge Orders     None        Note:  This document was prepared using Dragon voice recognition software and may include unintentional dictation errors.   Willo Dunnings, MD 05/18/24 1730  "

## 2024-05-19 ENCOUNTER — Observation Stay
Admit: 2024-05-19 | Discharge: 2024-05-19 | Disposition: A | Discharge status: Home / Self Care | Attending: Student in an Organized Health Care Education/Training Program | Admitting: Student in an Organized Health Care Education/Training Program

## 2024-05-19 ENCOUNTER — Observation Stay: Admit: 2024-05-19

## 2024-05-19 DIAGNOSIS — F17210 Nicotine dependence, cigarettes, uncomplicated: Secondary | ICD-10-CM | POA: Diagnosis present

## 2024-05-19 DIAGNOSIS — R0902 Hypoxemia: Secondary | ICD-10-CM | POA: Diagnosis present

## 2024-05-19 DIAGNOSIS — Z886 Allergy status to analgesic agent status: Secondary | ICD-10-CM | POA: Diagnosis not present

## 2024-05-19 DIAGNOSIS — Z880 Allergy status to penicillin: Secondary | ICD-10-CM | POA: Diagnosis not present

## 2024-05-19 DIAGNOSIS — I10 Essential (primary) hypertension: Secondary | ICD-10-CM | POA: Diagnosis present

## 2024-05-19 DIAGNOSIS — J9601 Acute respiratory failure with hypoxia: Secondary | ICD-10-CM | POA: Diagnosis present

## 2024-05-19 DIAGNOSIS — Z1152 Encounter for screening for COVID-19: Secondary | ICD-10-CM | POA: Diagnosis not present

## 2024-05-19 DIAGNOSIS — E119 Type 2 diabetes mellitus without complications: Secondary | ICD-10-CM | POA: Diagnosis present

## 2024-05-19 DIAGNOSIS — J441 Chronic obstructive pulmonary disease with (acute) exacerbation: Secondary | ICD-10-CM | POA: Diagnosis present

## 2024-05-19 DIAGNOSIS — Z833 Family history of diabetes mellitus: Secondary | ICD-10-CM | POA: Diagnosis not present

## 2024-05-19 DIAGNOSIS — N179 Acute kidney failure, unspecified: Secondary | ICD-10-CM | POA: Diagnosis present

## 2024-05-19 DIAGNOSIS — D509 Iron deficiency anemia, unspecified: Secondary | ICD-10-CM | POA: Diagnosis present

## 2024-05-19 DIAGNOSIS — Z8249 Family history of ischemic heart disease and other diseases of the circulatory system: Secondary | ICD-10-CM | POA: Diagnosis not present

## 2024-05-19 DIAGNOSIS — Z7985 Long-term (current) use of injectable non-insulin antidiabetic drugs: Secondary | ICD-10-CM | POA: Diagnosis not present

## 2024-05-19 DIAGNOSIS — E860 Dehydration: Secondary | ICD-10-CM | POA: Diagnosis present

## 2024-05-19 DIAGNOSIS — Z7984 Long term (current) use of oral hypoglycemic drugs: Secondary | ICD-10-CM | POA: Diagnosis not present

## 2024-05-19 DIAGNOSIS — G4730 Sleep apnea, unspecified: Secondary | ICD-10-CM | POA: Diagnosis present

## 2024-05-19 DIAGNOSIS — Z716 Tobacco abuse counseling: Secondary | ICD-10-CM | POA: Diagnosis not present

## 2024-05-19 DIAGNOSIS — Z7951 Long term (current) use of inhaled steroids: Secondary | ICD-10-CM | POA: Diagnosis not present

## 2024-05-19 DIAGNOSIS — I5033 Acute on chronic diastolic (congestive) heart failure: Secondary | ICD-10-CM | POA: Diagnosis not present

## 2024-05-19 DIAGNOSIS — Z791 Long term (current) use of non-steroidal anti-inflammatories (NSAID): Secondary | ICD-10-CM | POA: Diagnosis not present

## 2024-05-19 DIAGNOSIS — Z6834 Body mass index (BMI) 34.0-34.9, adult: Secondary | ICD-10-CM | POA: Diagnosis not present

## 2024-05-19 DIAGNOSIS — E785 Hyperlipidemia, unspecified: Secondary | ICD-10-CM | POA: Diagnosis present

## 2024-05-19 DIAGNOSIS — F209 Schizophrenia, unspecified: Secondary | ICD-10-CM | POA: Diagnosis present

## 2024-05-19 DIAGNOSIS — B9620 Unspecified Escherichia coli [E. coli] as the cause of diseases classified elsewhere: Secondary | ICD-10-CM | POA: Diagnosis present

## 2024-05-19 DIAGNOSIS — Z794 Long term (current) use of insulin: Secondary | ICD-10-CM | POA: Diagnosis not present

## 2024-05-19 LAB — RESPIRATORY PANEL BY PCR

## 2024-05-19 LAB — BASIC METABOLIC PANEL WITH GFR
Anion gap: 16 — ABNORMAL HIGH (ref 5–15)
BUN: 39 mg/dL — ABNORMAL HIGH (ref 8–23)
CO2: 27 mmol/L (ref 22–32)
Calcium: 8.8 mg/dL — ABNORMAL LOW (ref 8.9–10.3)
Chloride: 88 mmol/L — ABNORMAL LOW (ref 98–111)
Creatinine, Ser: 1.67 mg/dL — ABNORMAL HIGH (ref 0.44–1.00)
GFR, Estimated: 33 mL/min — ABNORMAL LOW
Glucose, Bld: 603 mg/dL (ref 70–99)
Potassium: 4.9 mmol/L (ref 3.5–5.1)
Sodium: 131 mmol/L — ABNORMAL LOW (ref 135–145)

## 2024-05-19 LAB — CBC
HCT: 36.6 % (ref 36.0–46.0)
Hemoglobin: 11 g/dL — ABNORMAL LOW (ref 12.0–15.0)
MCH: 23.1 pg — ABNORMAL LOW (ref 26.0–34.0)
MCHC: 30.1 g/dL (ref 30.0–36.0)
MCV: 76.9 fL — ABNORMAL LOW (ref 80.0–100.0)
Platelets: 363 K/uL (ref 150–400)
RBC: 4.76 MIL/uL (ref 3.87–5.11)
RDW: 18.4 % — ABNORMAL HIGH (ref 11.5–15.5)
WBC: 12.3 K/uL — ABNORMAL HIGH (ref 4.0–10.5)
nRBC: 0.2 % (ref 0.0–0.2)

## 2024-05-19 LAB — ECHOCARDIOGRAM COMPLETE
Height: 64 in
S' Lateral: 3 cm
Weight: 3199.32 [oz_av]

## 2024-05-19 LAB — GLUCOSE, CAPILLARY
Glucose-Capillary: 565 mg/dL (ref 70–99)
Glucose-Capillary: 339 mg/dL — ABNORMAL HIGH (ref 70–99)

## 2024-05-19 MED ORDER — DILTIAZEM HCL ER COATED BEADS 180 MG PO CP24
180.0000 mg | ORAL_CAPSULE | Freq: Every day | ORAL | Status: DC
  Administered 2024-05-19 – 2024-05-20 (×2): 180 mg via ORAL
  Filled 2024-05-19 (×2): qty 1

## 2024-05-19 MED ORDER — ESCITALOPRAM OXALATE 10 MG PO TABS
10.0000 mg | ORAL_TABLET | Freq: Every day | ORAL | Status: DC
  Administered 2024-05-20 – 2024-05-21 (×2): 10 mg via ORAL
  Filled 2024-05-19 (×3): qty 1

## 2024-05-19 MED ORDER — INSULIN GLARGINE 100 UNIT/ML ~~LOC~~ SOLN
15.0000 [IU] | Freq: Every day | SUBCUTANEOUS | Status: DC
  Administered 2024-05-19: 15 [IU] via SUBCUTANEOUS
  Filled 2024-05-19 (×2): qty 0.15

## 2024-05-19 MED ORDER — INSULIN ASPART 100 UNIT/ML IJ SOLN
12.0000 [IU] | Freq: Once | INTRAMUSCULAR | Status: AC
  Administered 2024-05-19: 12 [IU] via SUBCUTANEOUS
  Filled 2024-05-19: qty 12

## 2024-05-19 MED ORDER — FLUTICASONE FUROATE-VILANTEROL 100-25 MCG/ACT IN AEPB
1.0000 | INHALATION_SPRAY | Freq: Every day | RESPIRATORY_TRACT | Status: DC
  Administered 2024-05-19 – 2024-05-21 (×3): 1 via RESPIRATORY_TRACT
  Filled 2024-05-19: qty 28

## 2024-05-19 MED ORDER — FERROUS SULFATE 325 (65 FE) MG PO TABS
325.0000 mg | ORAL_TABLET | Freq: Every day | ORAL | Status: DC
  Administered 2024-05-20 – 2024-05-21 (×2): 325 mg via ORAL
  Filled 2024-05-19 (×2): qty 1

## 2024-05-19 MED ORDER — PANTOPRAZOLE SODIUM 20 MG PO TBEC
20.0000 mg | DELAYED_RELEASE_TABLET | Freq: Every day | ORAL | Status: DC
  Administered 2024-05-19 – 2024-05-21 (×3): 20 mg via ORAL
  Filled 2024-05-19 (×3): qty 1

## 2024-05-19 MED ORDER — ATORVASTATIN CALCIUM 20 MG PO TABS
40.0000 mg | ORAL_TABLET | Freq: Every day | ORAL | Status: DC
  Administered 2024-05-19 – 2024-05-21 (×3): 40 mg via ORAL
  Filled 2024-05-19 (×3): qty 2

## 2024-05-19 MED ORDER — METOPROLOL SUCCINATE ER 25 MG PO TB24
25.0000 mg | ORAL_TABLET | Freq: Every day | ORAL | Status: DC
  Administered 2024-05-19 – 2024-05-21 (×3): 25 mg via ORAL
  Filled 2024-05-19 (×3): qty 1

## 2024-05-19 MED ORDER — DOXYCYCLINE HYCLATE 100 MG PO TABS
100.0000 mg | ORAL_TABLET | Freq: Two times a day (BID) | ORAL | Status: DC
  Administered 2024-05-19 – 2024-05-21 (×5): 100 mg via ORAL
  Filled 2024-05-19 (×5): qty 1

## 2024-05-19 MED ORDER — INSULIN ASPART 100 UNIT/ML IJ SOLN
0.0000 [IU] | INTRAMUSCULAR | Status: DC
  Administered 2024-05-19: 7 [IU] via SUBCUTANEOUS
  Administered 2024-05-19 – 2024-05-20 (×3): 3 [IU] via SUBCUTANEOUS
  Administered 2024-05-20: 2 [IU] via SUBCUTANEOUS
  Administered 2024-05-20 – 2024-05-21 (×2): 1 [IU] via SUBCUTANEOUS
  Administered 2024-05-21: 2 [IU] via SUBCUTANEOUS
  Filled 2024-05-19: qty 1
  Filled 2024-05-19 (×2): qty 3
  Filled 2024-05-19: qty 1
  Filled 2024-05-19 (×2): qty 2
  Filled 2024-05-19: qty 7
  Filled 2024-05-19: qty 3

## 2024-05-19 NOTE — Progress Notes (Addendum)
 This RN was notified by NT that patient was refusing capillary blood sugar checks. This RN went to talk to patient; patient states that she wants to rely on her own continuous glucose monitoring system and not have her finger stuck by staff. This RN communicated with provider, Dr JAYSON Piety. Earlier in the day, patient allowed this RN to perform cbg stick and compare it to patient's CGM. CGM read was in mid-200s while the cbg resulted at 339. MD aware of patient refusing cbg finger sticks at this time.

## 2024-05-19 NOTE — Plan of Care (Signed)

## 2024-05-19 NOTE — Progress Notes (Signed)
*  PRELIMINARY RESULTS* Echocardiogram 2D Echocardiogram has been performed.  Deanna Gay 05/19/2024, 8:54 AM

## 2024-05-19 NOTE — Progress Notes (Signed)
 " PROGRESS NOTE Deanna Gay    DOB: [birth year], 69 y.o.  FMW:[id number]    Code Status: Full Code   DOA: 05/18/2024   LOS: 0  Brief hospital course  Deanna Gay is a 69 y.o. female with a PMH significant for iron  deficiency anemia, sleep apnea, HLD, COPD, schizophrenia, tobacco use disorder. At baseline, they live independently and are independent with their ADLs.   They presented from home to the ED on 05/18/2024 with shortness of breath worsening x several days.     In the ED, it was found that they had new oxygen requirement of 6 L nasal cannula to maintain sats greater than 90%, she was 80% on room air.  Respiratory rate 20, heart rate 96, blood pressure 108/57.  Significant findings included: WBC 10.4, hemoglobin 11.6.  LA 1.2.  Na+ 137, K+ 4.6, creatinine 1.45 (baseline 1.2), glucose 305.  Initial troponin 19.  proBNP 2771.  Negative for COVID, influenza, RSV Chest x-ray showed stable cardiomegaly with mild central pulmonary vascular congestion.  Had significant diffuse wheezing on initial lung exam.    They were initially treated with Lasix , DuoNebs, methylprednisolone , azithromycin , ceftriaxone   Patient was admitted to medicine service for further workup and management of hypoxia as outlined in detail below.  05/19/2024 -improved clinically, wean O2 as able.   Assessment & Plan  Principal Problem:   Hypoxia Active Problems:   Acute respiratory failure with hypoxia (HCC)   COPD exacerbation (HCC)  COPD exacerbation Acute respiratory failure with hypoxia-no baseline oxygen use, from 6L to 2L [place] today.  She tells me today that she was previously 2 packs per day smoker and hasn't smoked in weeks and has no desire to start again.  Suspect multifactorial etiology including CHF exacerbation due to the pulmonary edema and elevated BNP.  In 2019, she had an echo showing grade 1 diastolic dysfunction and otherwise trivial results. as well as COPD exacerbation given the  extensive wheezing appreciated on exam and expressed improvement with bronchodilator treatments.  She has no fever, leukocytosis or consolidations on chest x-ray.  Less likely pneumonia. She is s/p azithromycin  and ceftriaxone in the ED. Continuing doxycycline  course given elevated procalcitonin and increased sputum production.  - RVP swab negative - Bronchodilator treatments as needed - Budesonide  inhalers - Continue prednisone  p.o. - IV Lasix  one more dose as Cr mildly bumped today - Strict I's/O, daily weights - echo results pending - Wean O2 as tolerated - 1 set of blood cultures were collected, will follow. NGTD   Nausea-resolved.  - Zofran  as needed  Insulin  dependent Type II DM- glucose 603 this am, unfortunately elevated in setting os steroid use. Required additional novolog  this am for meal coverage. Her home dexcom reading was about 100 points lower than the [facility] inpatient reading this am.  - continue sensitive sliding scale - lantus  15u daily and increase as needed while on steroids  AKI- resulting from dehydration, possible hitting dry weight with the diuretics.  - will give morning dose tomorrow of lasix  and monitor cr again   Anemia, iron  deficient-hemoglobin appears to be at baseline 11.6 on admission.  Unlikely contributing to shortness of breath   Sleep apnea-unclear if patient uses CPAP at night.  Will continue on oxygen at this time   Schizophrenia-appears patient has not been taking a lot of her medications - restarting home medications per patient report  Body mass index is 34.32 kg/m.  VTE ppx: lovenox   Diet:  Diet   Diet regular Room service appropriate? Yes; Fluid consistency: Thin   Consultants: None   Subjective 05/19/2024    Pt reports feeling much improved today. Has some soreness on her left hand and saw some floaters overnight. Has no visual changes or eye pain. Describes a poor appetite but no nausea this am.    Objective  Blood pressure  128/65, pulse 67, temperature 98 F (36.7 C), temperature source Oral, resp. rate 18, height 5' 4 (1.626 m), weight 90.7 kg, SpO2 93%.  Intake/Output Summary (Last 24 hours) at 05/19/2024 0734 Last data filed at 05/19/2024 0300 Gross per 24 hour  Intake 275 ml  Output 800 ml  Net -525 ml   Filed Weights   05/18/24 1433 05/19/24 0537  Weight: 97.5 kg 90.7 kg     Physical Exam:  General: awake, alert, NAD HEENT: atraumatic, clear conjunctiva, anicteric sclera, MMM, hearing grossly normal Respiratory: normal respiratory effort. Cardiovascular: extremities well perfused, quick capillary refill, normal S1/S2, RRR, no JVD, murmurs Gastrointestinal: soft, NT, ND Nervous: A&O x3. no gross focal neurologic deficits, normal speech Extremities: moves all equally, no edema, normal tone Skin: dry, intact, normal temperature, normal color. No rashes, lesions or ulcers on exposed skin Psychiatry: normal mood, congruent affect  Labs   I have personally reviewed the following labs and imaging studies CBC    Component Value Date/Time   WBC 12.3 (H) 05/19/2024 0630   RBC 4.76 05/19/2024 0630   HGB 11.0 (L) 05/19/2024 0630   HGB 12.5 02/17/2023 0812   HGB 12.0 07/21/2013 1338   HCT 36.6 05/19/2024 0630   HCT 36.5 07/21/2013 1338   PLT 363 05/19/2024 0630   PLT 248 02/17/2023 0812   PLT 246 07/21/2013 1338   MCV 76.9 (L) 05/19/2024 0630   MCV 78 (L) 07/21/2013 1338   MCH 23.1 (L) 05/19/2024 0630   MCHC 30.1 05/19/2024 0630   RDW 18.4 (H) 05/19/2024 0630   RDW 17.5 (H) 07/21/2013 1338   LYMPHSABS 1.0 05/18/2024 1512   LYMPHSABS 2.4 07/21/2013 1338   MONOABS 0.7 05/18/2024 1512   MONOABS 0.4 07/21/2013 1338   EOSABS 0.0 05/18/2024 1512   EOSABS 0.0 07/21/2013 1338   BASOSABS 0.0 05/18/2024 1512   BASOSABS 0.1 07/21/2013 1338      Latest Ref Rng & Units 05/19/2024    6:30 AM 05/18/2024    7:02 PM 05/18/2024    3:12 PM  BMP  Glucose 70 - 99 mg/dL 396   694   BUN 8 - 23 mg/dL 39   35    Creatinine 9.55 - 1.00 mg/dL 8.32  8.58  8.54   Sodium 135 - 145 mmol/L 131   137   Potassium 3.5 - 5.1 mmol/L 4.9   4.6   Chloride 98 - 111 mmol/L 88   93   CO2 22 - 32 mmol/L 27   32   Calcium  8.9 - 10.3 mg/dL 8.8   9.0     DG Chest Port 1 View if patient is in a treatment room. Result Date: 05/18/2024 CLINICAL DATA:  Sepsis EXAM: PORTABLE CHEST 1 VIEW COMPARISON:  June 05, 2020 FINDINGS: Stable cardiomegaly with mild central pulmonary vascular congestion. No definite consolidative process is noted. Degenerative changes are seen involving both shoulders. IMPRESSION: Stable cardiomegaly with mild central pulmonary vascular congestion. Electronically Signed   By: Lynwood Landy Raddle M.D.   On: 05/18/2024 15:09    Disposition Plan & Communication  Patient status: Observation  Admitted From:  Home Planned disposition location: Home Anticipated discharge date: 1/2 pending respiratory improvement   Family Communication: none at bedside     Author: Marien LITTIE Piety, DO [facility] 05/19/2024, 7:34 AM   Available by Epic secure chat 7AM-7PM. If 7PM-7AM, please contact night-coverage.  [facility] contact information found on [email or web address].  "

## 2024-05-20 DIAGNOSIS — J441 Chronic obstructive pulmonary disease with (acute) exacerbation: Secondary | ICD-10-CM | POA: Diagnosis not present

## 2024-05-20 LAB — BASIC METABOLIC PANEL WITH GFR
Anion gap: 13 (ref 5–15)
BUN: 39 mg/dL — ABNORMAL HIGH (ref 8–23)
CO2: 31 mmol/L (ref 22–32)
Calcium: 8.5 mg/dL — ABNORMAL LOW (ref 8.9–10.3)
Chloride: 93 mmol/L — ABNORMAL LOW (ref 98–111)
Creatinine, Ser: 1.34 mg/dL — ABNORMAL HIGH (ref 0.44–1.00)
GFR, Estimated: 43 mL/min — ABNORMAL LOW
Glucose, Bld: 213 mg/dL — ABNORMAL HIGH (ref 70–99)
Potassium: 4 mmol/L (ref 3.5–5.1)
Sodium: 137 mmol/L (ref 135–145)

## 2024-05-20 MED ORDER — FUROSEMIDE 10 MG/ML IJ SOLN
40.0000 mg | Freq: Every day | INTRAMUSCULAR | Status: DC
  Administered 2024-05-21: 40 mg via INTRAVENOUS
  Filled 2024-05-20: qty 4

## 2024-05-20 NOTE — Progress Notes (Signed)
"   Noveletz and I  smelled cigarette in patient room around 2030. Patient admitted that she was smoking in the bathroom. Educated patient with Banessa(RN) about the risk of smoking in the hospital with her oxygen and offered nicotine  patch. Patient agreed to getting the nicotine  patch and understands teaching. Room was search with Bonnee, NT, no other cigarette was found in the room apart from the one that  was used. Two lighters taken out from her purse is now kept in patient specific bin.  "

## 2024-05-20 NOTE — Plan of Care (Signed)
  Problem: Education: Goal: Knowledge of General Education information will improve Description: Including pain rating scale, medication(s)/side effects and non-pharmacologic comfort measures Outcome: Progressing   Problem: Activity: Goal: Risk for activity intolerance will decrease Outcome: Progressing   Problem: Nutrition: Goal: Adequate nutrition will be maintained Outcome: Progressing   Problem: Pain Managment: Goal: General experience of comfort will improve and/or be controlled Outcome: Progressing   Problem: Skin Integrity: Goal: Risk for impaired skin integrity will decrease Outcome: Progressing

## 2024-05-20 NOTE — Progress Notes (Signed)
 patient was given two oxycodone  tablet in a medicine cup.  I saw patient took one and tried to hide the other tablet on her gown. I take the tablet out and watch her take both tablet.

## 2024-05-20 NOTE — Progress Notes (Addendum)
 Patient has a Dexcom to measure her blood glucose. The 12:00 pm blood glucose reading is 210 according to her Dexcom. Patient refused to allow nurse to check capillary blood glucose to compare capillary blood glucose reading against her Dexcom. Patient also refused 12:00 pm insulin  dose. MD notified.

## 2024-05-20 NOTE — Plan of Care (Signed)

## 2024-05-20 NOTE — Progress Notes (Signed)
 " PROGRESS NOTE Deanna Gay    DOB: [birth year], 69 y.o.  FMW:[id number]    Code Status: Full Code   DOA: 05/18/2024   LOS: 1  Brief hospital course  Deanna Gay is a 69 y.o. female with a PMH significant for iron  deficiency anemia, sleep apnea, HLD, COPD, schizophrenia, tobacco use disorder. At baseline, they live independently and are independent with their ADLs.   They presented from home to the ED on 05/18/2024 with shortness of breath worsening x several days.     In the ED, it was found that they had new oxygen requirement of 6 L nasal cannula to maintain sats greater than 90%, she was 80% on room air.  Respiratory rate 20, heart rate 96, blood pressure 108/57.  Significant findings included: WBC 10.4, hemoglobin 11.6.  LA 1.2.  Na+ 137, K+ 4.6, creatinine 1.45 (baseline 1.2), glucose 305.  Initial troponin 19.  proBNP 2771.  Negative for COVID, influenza, RSV Chest x-ray showed stable cardiomegaly with mild central pulmonary vascular congestion.  Had significant diffuse wheezing on initial lung exam.    They were initially treated with Lasix , DuoNebs, methylprednisolone , azithromycin , ceftriaxone   Patient was admitted to medicine service for further workup and management of hypoxia as outlined in detail below.  05/20/2024 -improved clinically, wean O2 as able.   Assessment & Plan  Principal Problem:   Hypoxia Active Problems:   Acute respiratory failure with hypoxia (HCC)   COPD exacerbation (HCC)  COPD exacerbation Acute respiratory failure with hypoxia-no baseline oxygen use, from 6L to 2L [place] today. Desat to 88% ORA. She was found to be smoking in the bathroom overnight.  She was previously 2 packs per day smoker and trying to quit.  Suspect multifactorial etiology including CHF exacerbation due to the pulmonary edema and elevated BNP.  In 2019, she had an echo showing grade 1 diastolic dysfunction and otherwise trivial results. as well as COPD exacerbation given  the extensive wheezing appreciated on exam and expressed improvement with bronchodilator treatments.  She has no fever, leukocytosis or consolidations on chest x-ray.  Less likely pneumonia. She is s/p azithromycin  and ceftriaxone in the ED. Continuing doxycycline  course given elevated procalcitonin and increased sputum production.  - RVP swab negative - Bronchodilator treatments as needed - Budesonide  inhalers - discontinue steroids as patient has been experiencing hyperglycemia and is refusing CBG checks and long-acting insulin  - continue daily IV Lasix , cr improved today - Strict I's/O, daily weights - echo results limited. EF 55-60% - Wean O2 as tolerated, ambulate on room air once able to wean from O2 - 1 set of blood cultures were collected, will follow. NGTD - continue nicotine  patch PRN   Nausea-resolved.  - Zofran  as needed  Bacteruria- ecoli on UxCx. No symptoms. No treatment.   Insulin  dependent Type II DM- glucose significantly elevated this admission in setting of DM and steroid use. Her home dexcom reading was about 100 points lower than the CBG inpatient reading yesterday so have concerns that her monitor is inaccurate.  She refuses CBG checks and she also refuses long-acting insulin . She is not willing to reconsider at this time.  - continue sensitive sliding scale - lantus  15u daily has been discontinued as patient is refusing to take this and refuses CBG checks. I have significant concern that she could develop hyperglycemia/HHS/DKA and have discussed this with patient.   AKI- resolving. Cr improved to baseline today 1.3   Anemia, iron  deficient-hemoglobin appears to be at baseline  11.6 on admission.  Unlikely contributing to shortness of breath   Sleep apnea-unclear if patient uses CPAP at night.  Will continue on oxygen at this time   Schizophrenia-appears patient has not been taking a lot of her medications - restarting home medications per patient report  Body  mass index is 34.02 kg/m.  VTE ppx: lovenox   Diet:     Diet   Diet regular Room service appropriate? Yes; Fluid consistency: Thin   Consultants: None   Subjective 05/20/2024    Pt reports feeling the same today. Denies SOB. She states she will not take any long acting insulin  and will not undergo CBG checks. Denies nausea, vomiting, abdominal pain, diaphoresis, tremor.    Objective  Blood pressure 128/65, pulse 67, temperature 98 F (36.7 C), temperature source Oral, resp. rate 18, height 5' 4 (1.626 m), weight 90.7 kg, SpO2 93%.  Intake/Output Summary (Last 24 hours) at 05/20/2024 0738 Last data filed at 05/20/2024 0500 Gross per 24 hour  Intake 360 ml  Output 950 ml  Net -590 ml   Filed Weights   05/18/24 1433 05/19/24 0537 05/20/24 0500  Weight: 97.5 kg 90.7 kg 89.9 kg     Physical Exam:  General: awake, alert, NAD HEENT: atraumatic, clear conjunctiva, anicteric sclera, MMM, hearing grossly normal Respiratory: normal respiratory effort. Cardiovascular: extremities well perfused, quick capillary refill, normal S1/S2, RRR, no JVD, murmurs Gastrointestinal: soft, NT, ND Nervous: A&O x3. no gross focal neurologic deficits, normal speech Extremities: moves all equally, no edema, normal tone Skin: dry, intact, normal temperature, normal color. No rashes, lesions or ulcers on exposed skin Psychiatry: normal mood, congruent affect  Labs   I have personally reviewed the following labs and imaging studies CBC    Component Value Date/Time   WBC 12.3 (H) 05/19/2024 0630   RBC 4.76 05/19/2024 0630   HGB 11.0 (L) 05/19/2024 0630   HGB 12.5 02/17/2023 0812   HGB 12.0 07/21/2013 1338   HCT 36.6 05/19/2024 0630   HCT 36.5 07/21/2013 1338   PLT 363 05/19/2024 0630   PLT 248 02/17/2023 0812   PLT 246 07/21/2013 1338   MCV 76.9 (L) 05/19/2024 0630   MCV 78 (L) 07/21/2013 1338   MCH 23.1 (L) 05/19/2024 0630   MCHC 30.1 05/19/2024 0630   RDW 18.4 (H) 05/19/2024 0630   RDW 17.5  (H) 07/21/2013 1338   LYMPHSABS 1.0 05/18/2024 1512   LYMPHSABS 2.4 07/21/2013 1338   MONOABS 0.7 05/18/2024 1512   MONOABS 0.4 07/21/2013 1338   EOSABS 0.0 05/18/2024 1512   EOSABS 0.0 07/21/2013 1338   BASOSABS 0.0 05/18/2024 1512   BASOSABS 0.1 07/21/2013 1338      Latest Ref Rng & Units 05/20/2024    5:06 AM 05/19/2024    6:30 AM 05/18/2024    7:02 PM  BMP  Glucose 70 - 99 mg/dL 786  396    BUN 8 - 23 mg/dL 39  39    Creatinine 9.55 - 1.00 mg/dL 8.65  8.32  8.58   Sodium 135 - 145 mmol/L 137  131    Potassium 3.5 - 5.1 mmol/L 4.0  4.9    Chloride 98 - 111 mmol/L 93  88    CO2 22 - 32 mmol/L 31  27    Calcium  8.9 - 10.3 mg/dL 8.5  8.8      ECHOCARDIOGRAM COMPLETE Result Date: 05/19/2024    ECHOCARDIOGRAM REPORT   Patient Name:   EZELLA KELL Date of Exam: 05/19/2024  Medical Rec #:  [id number]          Height:       64.0 in Accession #:    [id number]         Weight:       200.0 lb Date of Birth:  [birth year]          BSA:          1.956 m Patient Age:    68 years           BP:           128/65 mmHg Patient Gender: F                  HR:           67 bpm. Exam Location:  [facility] Procedure: 2D Echo, Cardiac Doppler and Color Doppler (Both Spectral and Color            Flow Doppler were utilized during procedure). Indications:     Congestive heart failure I50.9  History:         Patient has prior history of Echocardiogram examinations, most                  recent 05/16/2018. COPD; Risk Factors:Hypertension and                  Dyslipidemia.  Sonographer:     Christopher Furnace Referring Phys:  [id number] MARIEN LITTIE PIETY Diagnosing Phys: Keller Paterson  Sonographer Comments: Technically challenging study due to limited acoustic windows and no apical window. Image acquisition challenging due to COPD. IMPRESSIONS  1. Poor study qualit with limited views. No apical or subcoastal views.  2. Left ventricle incompletely visualized. Preserved LVEF 55-60% based on limited parasternal views. Cannot assess for  wall motion abnormalities. Left ventricular diastolic parameters are indeterminate.  3. Right ventricular systolic function was not well visualized. The right ventricular size is not well visualized.  4. The mitral valve is normal in structure. Trivial mitral valve regurgitation.  5. The aortic valve was not well visualized. Aortic valve regurgitation is not visualized. FINDINGS  Left Ventricle: Left ventricle incompletely visualized. Preserved LVEF 55-60% based on limited parasternal views. Cannot assess for wall motion abnormalities. There is no left ventricular hypertrophy. Left ventricular diastolic parameters are indeterminate. Right Ventricle: The right ventricular size is not well visualized. Right vetricular wall thickness was not well visualized. Right ventricular systolic function was not well visualized. Left Atrium: Left atrial size was not well visualized. Right Atrium: Right atrial size was not well visualized. Pericardium: There is no evidence of pericardial effusion. Mitral Valve: The mitral valve is normal in structure. Trivial mitral valve regurgitation. Tricuspid Valve: The tricuspid valve is not well visualized. Tricuspid valve regurgitation is trivial. Aortic Valve: The aortic valve was not well visualized. Aortic valve regurgitation is not visualized. Pulmonic Valve: The pulmonic valve was not well visualized. Pulmonic valve regurgitation is not visualized. Aorta: The aortic root is normal in size and structure. IAS/Shunts: The interatrial septum was not well visualized.  LEFT VENTRICLE PLAX 2D LVIDd:         4.40 cm LVIDs:         3.00 cm LV PW:         1.00 cm LV IVS:        0.80 cm LVOT diam:     2.00 cm LVOT Area:     3.14 cm  LEFT ATRIUM  Index LA diam:    2.30 cm 1.18 cm/m   AORTA Ao Root diam: 2.40 cm  SHUNTS Systemic Diam: 2.00 cm Keller Paterson Electronically signed by Keller Paterson Signature Date/Time: 05/19/2024/3:32:03 PM    Final    DG Chest Port 1 View if patient is in a  treatment room. Result Date: 05/18/2024 CLINICAL DATA:  Sepsis EXAM: PORTABLE CHEST 1 VIEW COMPARISON:  June 05, 2020 FINDINGS: Stable cardiomegaly with mild central pulmonary vascular congestion. No definite consolidative process is noted. Degenerative changes are seen involving both shoulders. IMPRESSION: Stable cardiomegaly with mild central pulmonary vascular congestion. Electronically Signed   By: Lynwood Landy Raddle M.D.   On: 05/18/2024 15:09    Disposition Plan & Communication  Patient status: Inpatient  Admitted From: Home Planned disposition location: Home Anticipated discharge date: 1/3 pending respiratory improvement   Family Communication: none at bedside     Author: Marien LITTIE Piety, DO [facility] 05/20/2024, 7:38 AM   Available by Epic secure chat 7AM-7PM. If 7PM-7AM, please contact night-coverage.  [facility] contact information found on [email or web address]  "

## 2024-05-21 DIAGNOSIS — J441 Chronic obstructive pulmonary disease with (acute) exacerbation: Secondary | ICD-10-CM | POA: Diagnosis not present

## 2024-05-21 DIAGNOSIS — J9601 Acute respiratory failure with hypoxia: Secondary | ICD-10-CM | POA: Diagnosis not present

## 2024-05-21 LAB — URINE CULTURE: Culture: >=100000 — AB

## 2024-05-21 MED ORDER — MELOXICAM 15 MG PO TABS: 15.0000 mg | ORAL_TABLET | Freq: Every day | ORAL | Status: AC | PRN

## 2024-05-21 MED ORDER — NICOTINE 14 MG/24HR TD PT24: 14.0000 mg | MEDICATED_PATCH | TRANSDERMAL | 0 refills | Status: AC

## 2024-05-21 MED ORDER — IPRATROPIUM-ALBUTEROL 0.5-2.5 (3) MG/3ML IN SOLN: 3.0000 mL | Freq: Four times a day (QID) | RESPIRATORY_TRACT | 0 refills | Status: AC | PRN

## 2024-05-21 MED ORDER — INSULIN GLARGINE 100 UNIT/ML ~~LOC~~ SOLN: 10.0000 [IU] | Freq: Every day | SUBCUTANEOUS | Status: AC

## 2024-05-21 NOTE — Progress Notes (Signed)
 Patient received discharged instructions. Patient discharged with personal belongings and wheeled out by staff. Patient transported home via family vehicle. No distress noted in patient.

## 2024-05-21 NOTE — Discharge Summary (Signed)
 " Physician Discharge Summary  Patient: Deanna Gay FMW:[id number] DOB: 25-Aug-1955   Code Status: Full Code Admit date: 05/18/2024 Discharge date: 05/21/2024 Disposition: Home, No home health services recommended PCP: Lenon Layman ORN, MD  Recommendations for Outpatient Follow-up:  Follow up with PCP within 1-2 weeks Regarding general hospital follow up and preventative care  Discharge Diagnoses:  Principal Problem:   Hypoxia Active Problems:   Acute respiratory failure with hypoxia (HCC)   COPD exacerbation [facility])  Brief Hospital Course Summary: Deanna Gay is a 69 y.o. female with a PMH significant for iron  deficiency anemia, sleep apnea, HLD, COPD- denies O2 use at baseline, schizophrenia, tobacco use disorder. At baseline, they live independently and are independent with their ADLs.   They presented from home to the ED on 05/18/2024 with shortness of breath worsening x several days.     In the ED, it was found that they had new oxygen requirement of 6 L nasal cannula to maintain sats greater than 90%, she was 80% on room air.  Respiratory rate 20, heart rate 96, blood pressure 108/57.  Significant findings included: WBC 10.4, hemoglobin 11.6.  LA 1.2.  Na+ 137, K+ 4.6, creatinine 1.45 (baseline 1.2), glucose 305.  Initial troponin 19.  proBNP 2771.  Negative for COVID, influenza, RSV Chest x-ray showed stable cardiomegaly with mild central pulmonary vascular congestion.  Had significant diffuse wheezing on initial lung exam.    They were initially treated with Lasix , DuoNebs, methylprednisolone , azithromycin , ceftriaxone .  She continued on breathing treatments, steroids, and antibiotics for COPD exacerbation.  Her care was complicated by non-adherence to treatments.   We had to discontinue steroids since she had significant hyperglycemia and refused CBG checks and at times refused insulin . She has a CGM but this was seen to be inaccurate when compared to the  hospital CBG machines.   She was also found to be smoking a cigarette in the bathroom while on oxygen.   Overall, she continued to improve gradually but was not able to wean from oxygen so we had planned to send her home with oxygen. At that time, she revealed that she did have home oxygen already and that it is working. So, her family member brought in her home concentrator, we verified that it was functioning well and she was able to dc home in good condition.    She was prescribed nicotine  patches and counseled on the dangers of smoking while on oxygen therapy.  She was instructed to complete her doxycycline  course that was not finished previously and to follow up with her PCP within a week to reevaluate her O2 needs.   All other chronic conditions were treated with home medications.    Discharge Condition: Good, improved Recommended discharge diet: Regular healthy diet  Consultations: None   Procedures/Studies: None   Allergies as of 05/21/2024       Reactions   Penicillins Swelling   Has patient had a PCN reaction causing immediate rash, facial/tongue/throat swelling, SOB or lightheadedness with hypotension: Yes Has patient had a PCN reaction causing severe rash involving mucus membranes or skin necrosis: Yes Has patient had a PCN reaction that required hospitalization: No Has patient had a PCN reaction occurring within the last 10 years: No If all of the above answers are NO, then may proceed with Cephalosporin use. Has patient had a PCN reaction causing immediate rash, facial/tongue/throat swelling, SOB or lightheadedness with hypotension: Yes Has patient had a PCN reaction causing severe rash involving mucus membranes  or skin necrosis: Yes Has patient had a PCN reaction that required hospitalization: No Has patient had a PCN reaction occurring within the last 10 years: No If all of the above answers are NO, then may proceed with Cephalosporin use. Has patient had a PCN  reaction causing immediate rash, faci   Aspirin  Other (See Comments)   Ongoing anemia   Iodinated Contrast Media Swelling   Iodine Swelling        Medication List     STOP taking these medications    Accu-Chek Aviva Plus test strip Generic drug: glucose blood   Accu-Chek Aviva Plus w/Device Kit   celecoxib 200 MG capsule Commonly known as: CELEBREX   diltiazem  180 MG 24 hr capsule Commonly known as: TIAZAC    diltiazem  60 MG tablet Commonly known as: Cardizem    docusate sodium  100 MG capsule Commonly known as: COLACE   doxycycline  100 MG tablet Commonly known as: VIBRA -TABS   fluconazole 100 MG tablet Commonly known as: DIFLUCAN   metoprolol  tartrate 100 MG tablet Commonly known as: LOPRESSOR    omeprazole 40 MG capsule Commonly known as: PRILOSEC   predniSONE  10 MG tablet Commonly known as: DELTASONE    predniSONE  50 MG tablet Commonly known as: DELTASONE        TAKE these medications    albuterol  108 (90 Base) MCG/ACT inhaler Commonly known as: VENTOLIN  HFA Inhale 2 puffs into the lungs every 4 (four) hours as needed for wheezing or shortness of breath.   atorvastatin  40 MG tablet Commonly known as: LIPITOR Take 40 mg by mouth daily.   budesonide -formoterol  160-4.5 MCG/ACT inhaler Commonly known as: SYMBICORT  Inhale 2 puffs into the lungs 2 (two) times daily.   Calcium  Carb-Cholecalciferol  600-400 MG-UNIT Tabs Take 1 tablet by mouth daily.   calcium  carbonate 1500 (600 Ca) MG Tabs tablet Commonly known as: OSCAL Take by mouth 2 (two) times daily with a meal.   cyclobenzaprine  10 MG tablet Commonly known as: FLEXERIL  Take 10 mg by mouth at bedtime.   diltiazem  180 MG 24 hr capsule Commonly known as: CARDIZEM  CD Take 180 mg by mouth at bedtime. You may have different instructions for this medication elsewhere on this list. Ask your doctor how you should be taking this medication.   escitalopram  10 MG tablet Commonly known as:  LEXAPRO  Take 10 mg by mouth daily.   Evzio 0.4 MG/0.4ML Soaj Generic drug: Naloxone HCl Inject as directed.   fexofenadine 180 MG tablet Commonly known as: ALLEGRA Take 180 mg by mouth daily.   fluticasone  50 MCG/ACT nasal spray Commonly known as: FLONASE Place 2 sprays into both nostrils daily.   furosemide  20 MG tablet Commonly known as: LASIX  Take 20 mg by mouth daily.   insulin  glargine 100 UNIT/ML injection Commonly known as: LANTUS  Inject 0.1 mLs (10 Units total) into the skin daily. What changed: when to take this   insulin  lispro 100 UNIT/ML injection Commonly known as: HumaLOG  Please check your sugars before each meal and use lispro per the below sliding scale:  For sugars 151-200: take 2 units For 201-250: take 4units For 251-300: take 6 units For 301-350: take 8 units For 351-400: take 10 units For > 401: call MD   Insulin  Syringe-Needle U-100 31G X 5/16 0.5 ML Misc use as directed once daily   ipratropium-albuterol  0.5-2.5 (3) MG/3ML Soln Commonly known as: DUONEB Take 3 mLs by nebulization every 6 (six) hours as needed.   iron  polysaccharides 150 MG capsule Commonly known as: NIFEREX Take 1  capsule (150 mg total) by mouth 2 (two) times daily.   Jardiance 10 MG Tabs tablet Generic drug: empagliflozin Take 10 mg by mouth daily.   losartan  50 MG tablet Commonly known as: COZAAR  Take 50 mg by mouth daily.   Magnesium  250 MG Tabs Take by mouth daily.   meloxicam  15 MG tablet Commonly known as: MOBIC  Take 1 tablet (15 mg total) by mouth daily as needed for pain. What changed:  when to take this reasons to take this   metoprolol  succinate 25 MG 24 hr tablet Commonly known as: TOPROL -XL Take 25 mg by mouth daily.   montelukast 10 MG tablet Commonly known as: SINGULAIR Take 10 mg by mouth at bedtime.   Mounjaro 5 MG/0.5ML Pen Generic drug: tirzepatide Inject 5 mg into the skin once a week.   mupirocin  ointment 2 % Commonly known as:  BACTROBAN  Apply 1 Application topically 2 (two) times daily. Apply to wounds QD.   nicotine  14 mg/24hr patch Commonly known as: NICODERM CQ  - dosed in mg/24 hours Place 1 patch (14 mg total) onto the skin daily.   nitroGLYCERIN  0.4 MG SL tablet Commonly known as: NITROSTAT  Place 1 tablet under the tongue every 5 (five) minutes as needed.   Oxycodone  HCl 10 MG Tabs Take 10 mg by mouth every 6 (six) hours.   oxyCODONE -acetaminophen  7.5-325 MG tablet Commonly known as: PERCOCET Take 1-2 tablets by mouth every 6 (six) hours as needed for severe pain.   potassium chloride  10 MEQ tablet Commonly known as: KLOR-CON  Take 10 mEq by mouth daily.   pregabalin  25 MG capsule Commonly known as: LYRICA  Take 25 mg by mouth 2 (two) times daily.   roflumilast 500 MCG Tabs tablet Commonly known as: DALIRESP Take 500 mcg by mouth daily.   tiotropium 18 MCG inhalation capsule Commonly known as: SPIRIVA Place 18 mcg into inhaler and inhale daily.   triamcinolone  0.1 % paste Commonly known as: KENALOG  Use as directed 1 application  in the mouth or throat 2 (two) times daily.   Trulicity 0.75 MG/0.5ML Soaj Generic drug: Dulaglutide Inject 0.75 mg into the skin once a week.   vitamin B-12 500 MCG tablet Commonly known as: CYANOCOBALAMIN  Take 500 mcg by mouth daily.   ZINC  PO Take by mouth.               Durable Medical Equipment  (From admission, onward)           Start     Ordered   05/21/24 0933  For home use only DME Walker rolling  Once       Question Answer Comment  Walker: With 5 Inch Wheels   Patient needs a walker to treat with the following condition Physical deconditioning      05/21/24 0932   05/21/24 0846  For home use only DME oxygen  Once       Question Answer Comment  Length of Need 6 Months   Mode or (Route) Nasal cannula   Liters per Minute 4   Frequency Continuous (stationary and portable oxygen unit needed)   Oxygen conserving device Yes    Oxygen delivery system: Gas      05/21/24 0845            Follow-up Information     Lenon Layman ORN, MD. Schedule an appointment as soon as possible for a visit in 1 week(s).   Specialty: Internal Medicine Contact information: [address] [place] [address] [phone]  Subjective   Pt reports feeling alright. Denies SOB at rest.   All questions and concerns were addressed at time of discharge.  Objective  Blood pressure 123/71, pulse 60, temperature 97.7 F (36.5 C), temperature source Oral, resp. rate 20, height 5' 4 (1.626 m), weight 89.9 kg, SpO2 96%.   General: Pt is alert, awake, not in acute distress Cardiovascular: RRR, S1/S2 +, no rubs, no gallops Respiratory: CTA bilaterally, no wheezing, no rhonchi Abdominal: Soft, NT, ND, bowel sounds + Extremities: no edema, no cyanosis  The results of significant diagnostics from this hospitalization (including imaging, microbiology, ancillary and laboratory) are listed below for reference.   Imaging studies: ECHOCARDIOGRAM COMPLETE Result Date: 05/19/2024    ECHOCARDIOGRAM REPORT   Patient Name:   SYRENA BURGES Date of Exam: 05/19/2024 Medical Rec #:  [id number]          Height:       64.0 in Accession #:    [id number]         Weight:       200.0 lb Date of Birth:  [birth year]          BSA:          1.956 m Patient Age:    68 years           BP:           128/65 mmHg Patient Gender: F                  HR:           67 bpm. Exam Location:  [facility] Procedure: 2D Echo, Cardiac Doppler and Color Doppler (Both Spectral and Color            Flow Doppler were utilized during procedure). Indications:     Congestive heart failure I50.9  History:         Patient has prior history of Echocardiogram examinations, most                  recent 05/16/2018. COPD; Risk Factors:Hypertension and                  Dyslipidemia.  Sonographer:     Christopher Furnace Referring Phys:  [id number] MARIEN LITTIE PIETY Diagnosing  Phys: Keller Paterson  Sonographer Comments: Technically challenging study due to limited acoustic windows and no apical window. Image acquisition challenging due to COPD. IMPRESSIONS  1. Poor study qualit with limited views. No apical or subcoastal views.  2. Left ventricle incompletely visualized. Preserved LVEF 55-60% based on limited parasternal views. Cannot assess for wall motion abnormalities. Left ventricular diastolic parameters are indeterminate.  3. Right ventricular systolic function was not well visualized. The right ventricular size is not well visualized.  4. The mitral valve is normal in structure. Trivial mitral valve regurgitation.  5. The aortic valve was not well visualized. Aortic valve regurgitation is not visualized. FINDINGS  Left Ventricle: Left ventricle incompletely visualized. Preserved LVEF 55-60% based on limited parasternal views. Cannot assess for wall motion abnormalities. There is no left ventricular hypertrophy. Left ventricular diastolic parameters are indeterminate. Right Ventricle: The right ventricular size is not well visualized. Right vetricular wall thickness was not well visualized. Right ventricular systolic function was not well visualized. Left Atrium: Left atrial size was not well visualized. Right Atrium: Right atrial size was not well visualized. Pericardium: There is no evidence of pericardial effusion. Mitral Valve: The mitral valve is normal in structure. Trivial mitral valve regurgitation. Tricuspid  Valve: The tricuspid valve is not well visualized. Tricuspid valve regurgitation is trivial. Aortic Valve: The aortic valve was not well visualized. Aortic valve regurgitation is not visualized. Pulmonic Valve: The pulmonic valve was not well visualized. Pulmonic valve regurgitation is not visualized. Aorta: The aortic root is normal in size and structure. IAS/Shunts: The interatrial septum was not well visualized.  LEFT VENTRICLE PLAX 2D LVIDd:         4.40 cm LVIDs:          3.00 cm LV PW:         1.00 cm LV IVS:        0.80 cm LVOT diam:     2.00 cm LVOT Area:     3.14 cm  LEFT ATRIUM         Index LA diam:    2.30 cm 1.18 cm/m   AORTA Ao Root diam: 2.40 cm  SHUNTS Systemic Diam: 2.00 cm Keller Paterson Electronically signed by Keller Paterson Signature Date/Time: 05/19/2024/3:32:03 PM    Final    DG Chest Port 1 View if patient is in a treatment room. Result Date: 05/18/2024 CLINICAL DATA:  Sepsis EXAM: PORTABLE CHEST 1 VIEW COMPARISON:  June 05, 2020 FINDINGS: Stable cardiomegaly with mild central pulmonary vascular congestion. No definite consolidative process is noted. Degenerative changes are seen involving both shoulders. IMPRESSION: Stable cardiomegaly with mild central pulmonary vascular congestion. Electronically Signed   By: Lynwood Landy Raddle M.D.   On: 05/18/2024 15:09    Labs: Basic Metabolic Panel: Recent Labs  Lab 05/18/24 1512 05/18/24 1902 05/19/24 0630 05/20/24 0506  NA 137  --  131* 137  K 4.6  --  4.9 4.0  CL 93*  --  88* 93*  CO2 32  --  27 31  GLUCOSE 305*  --  603* 213*  BUN 35*  --  39* 39*  CREATININE 1.45* 1.41* 1.67* 1.34*  CALCIUM  9.0  --  8.8* 8.5*   CBC: Recent Labs  Lab 05/16/24 1002 05/18/24 1512 05/18/24 1902 05/19/24 0630  WBC 10.3 10.4 11.9* 12.3*  NEUTROABS 8.5* 8.5*  --   --   HGB 11.7* 11.6* 10.8* 11.0*  HCT 38.7 38.9 35.0* 36.6  MCV 78.0* 76.6* 75.4* 76.9*  PLT 208 333 326 363   Microbiology: Results for orders placed or performed during the hospital encounter of 05/18/24  Culture, blood (Routine x 2)     Status: None (Preliminary result)   Collection Time: 05/18/24  3:12 PM   Specimen: BLOOD  Result Value Ref Range Status   Specimen Description BLOOD RIGHT ARM  Final   Special Requests   Final    BOTTLES DRAWN AEROBIC AND ANAEROBIC Blood Culture adequate volume   Culture   Final    NO GROWTH 3 DAYS Performed at [facility], [address], [place], [place] [address]    Report  Status PENDING  Incomplete  Resp panel by RT-PCR (RSV, Flu A&B, Covid) Anterior Nasal Swab     Status: None   Collection Time: 05/18/24  3:12 PM   Specimen: Anterior Nasal Swab  Result Value Ref Range Status   SARS Coronavirus 2 by RT PCR NEGATIVE NEGATIVE Final    Comment: (NOTE) SARS-CoV-2 target nucleic acids are NOT DETECTED.  The SARS-CoV-2 RNA is generally detectable in upper respiratory specimens during the acute phase of infection. The lowest concentration of SARS-CoV-2 viral copies this assay can detect is 138 copies/mL. A negative result does not preclude SARS-Cov-2  infection and should not be used as the sole basis for treatment or other patient management decisions. A negative result may occur with  improper specimen collection/handling, submission of specimen other than nasopharyngeal swab, presence of viral mutation(s) within the areas targeted by this assay, and inadequate number of viral copies(<138 copies/mL). A negative result must be combined with clinical observations, patient history, and epidemiological information. The expected result is Negative.  Fact Sheet for Patients:  bloggercourse.com  Fact Sheet for Healthcare Providers:  seriousbroker.it  This test is no t yet approved or cleared by the [place]  FDA and  has been authorized for detection and/or diagnosis of SARS-CoV-2 by FDA under an Emergency Use Authorization (EUA). This EUA will remain  in effect (meaning this test can be used) for the duration of the COVID-19 declaration under Section 564(b)(1) of the Act, 21 U.S.C.section 360bbb-3(b)(1), unless the authorization is terminated  or revoked sooner.       Influenza A by PCR NEGATIVE NEGATIVE Final   Influenza B by PCR NEGATIVE NEGATIVE Final    Comment: (NOTE) The Xpert Xpress SARS-CoV-2/FLU/RSV plus assay is intended as an aid in the diagnosis of influenza from Nasopharyngeal swab  specimens and should not be used as a sole basis for treatment. Nasal washings and aspirates are unacceptable for Xpert Xpress SARS-CoV-2/FLU/RSV testing.  Fact Sheet for Patients: bloggercourse.com  Fact Sheet for Healthcare Providers: seriousbroker.it  This test is not yet approved or cleared by the [place]  FDA and has been authorized for detection and/or diagnosis of SARS-CoV-2 by FDA under an Emergency Use Authorization (EUA). This EUA will remain in effect (meaning this test can be used) for the duration of the COVID-19 declaration under Section 564(b)(1) of the Act, 21 U.S.C. section 360bbb-3(b)(1), unless the authorization is terminated or revoked.     Resp Syncytial Virus by PCR NEGATIVE NEGATIVE Final    Comment: (NOTE) Fact Sheet for Patients: bloggercourse.com  Fact Sheet for Healthcare Providers: seriousbroker.it  This test is not yet approved or cleared by the [place]  FDA and has been authorized for detection and/or diagnosis of SARS-CoV-2 by FDA under an Emergency Use Authorization (EUA). This EUA will remain in effect (meaning this test can be used) for the duration of the COVID-19 declaration under Section 564(b)(1) of the Act, 21 U.S.C. section 360bbb-3(b)(1), unless the authorization is terminated or revoked.  Performed at [facility], [address], [place], [place] [address]   Urine Culture     Status: Abnormal (Preliminary result)   Collection Time: 05/18/24  3:12 PM   Specimen: Urine, Random  Result Value Ref Range Status   Specimen Description   Final    URINE, RANDOM Performed at [facility], [address] [place], [place] [address]    Special Requests   Final    NONE Reflexed from [id number] Performed at [facility], [address] [place], [place] [address]    Culture (A)  Final    >=100,000  COLONIES/mL ESCHERICHIA COLI SUSCEPTIBILITIES TO FOLLOW Performed at [facility], [address] [place], [place] [address]    Report Status PENDING  Incomplete  Respiratory (~20 pathogens) panel by PCR     Status: None   Collection Time: 05/18/24  6:37 PM   Specimen: Nasopharyngeal Swab; Respiratory  Result Value Ref Range Status   Adenovirus NOT DETECTED NOT DETECTED Final   Coronavirus 229E NOT DETECTED NOT DETECTED Final    Comment: (NOTE) The Coronavirus on the Respiratory  Panel, DOES NOT test for the novel  Coronavirus (2019 nCoV)    Coronavirus HKU1 NOT DETECTED NOT DETECTED Final   Coronavirus NL63 NOT DETECTED NOT DETECTED Final   Coronavirus OC43 NOT DETECTED NOT DETECTED Final   Metapneumovirus NOT DETECTED NOT DETECTED Final   Rhinovirus / Enterovirus NOT DETECTED NOT DETECTED Final   Influenza A NOT DETECTED NOT DETECTED Final   Influenza B NOT DETECTED NOT DETECTED Final   Parainfluenza Virus 1 NOT DETECTED NOT DETECTED Final   Parainfluenza Virus 2 NOT DETECTED NOT DETECTED Final   Parainfluenza Virus 3 NOT DETECTED NOT DETECTED Final   Parainfluenza Virus 4 NOT DETECTED NOT DETECTED Final   Respiratory Syncytial Virus NOT DETECTED NOT DETECTED Final   Bordetella pertussis NOT DETECTED NOT DETECTED Final   Bordetella Parapertussis NOT DETECTED NOT DETECTED Final   Chlamydophila pneumoniae NOT DETECTED NOT DETECTED Final   Mycoplasma pneumoniae NOT DETECTED NOT DETECTED Final    Comment: Performed at [facility], [address] [place], [place] [address]  Culture, blood (Routine x 2)     Status: None (Preliminary result)   Collection Time: 05/18/24  7:02 PM   Specimen: BLOOD  Result Value Ref Range Status   Specimen Description BLOOD BLOOD RIGHT ARM  Final   Special Requests   Final    BOTTLES DRAWN AEROBIC AND ANAEROBIC Blood Culture adequate volume   Culture   Final    NO GROWTH 3 DAYS Performed at [facility], [address],  [place], [place] [address]    Report Status PENDING  Incomplete    Time coordinating discharge: Over 30 minutes  Marien LITTIE Piety, MD  [facility] 05/21/2024, 9:40 AM  "

## 2024-05-21 NOTE — Plan of Care (Signed)

## 2024-05-21 NOTE — Progress Notes (Signed)
 The beneficiary has a mobility limitation that significantly impairs his/her ability to participate in one or more mobility-related activities of daily living (MRADL) in the home. The patient is able to safely use the walker. The functional mobility deficit can be sufficiently resolved by use of walker.

## 2024-05-21 NOTE — TOC Transition Note (Addendum)
 Transition of Care [facility]) - Discharge Note   Patient Details  Name: Deanna Gay MRN: [id number] Date of Birth: [birth year]  [facility] CM/SW Contact:  Liba Hulsey L Erhardt Dada, LCSW Phone Number: 05/21/2024, 10:34 AM   Clinical Narrative:     Patient ready for discharge. Oxygen and rolling walker ordered through [facility]. Patient agreeable to using [employer] to fill DME order. Patient advised her son will provide transportation.   No further TOC needs. TOC signing off.   12:33pm: Secure chat received advising patient declined [employer], advising she doesn't like them. Patient advised that she will use [employer]. Request sent in portal.         Patient Goals and CMS Choice            Discharge Placement                       Discharge Plan and Services Additional resources added to the After Visit Summary for                                       Social Drivers of Health (SDOH) Interventions SDOH Screenings   Food Insecurity: No Food Insecurity (05/18/2024)  Housing: Low Risk (05/18/2024)  Transportation Needs: No Transportation Needs (05/18/2024)  Utilities: Not At Risk (05/18/2024)  Depression (PHQ2-9): Low Risk (05/17/2024)  Financial Resource Strain: Low Risk  (12/15/2023)   Received from [facility]  Social Connections: Unknown (05/18/2024)  Tobacco Use: High Risk (05/18/2024)     Readmission Risk Interventions     No data to display

## 2024-05-21 NOTE — Discharge Instructions (Addendum)
 I have prescribed nicotine  patches but I'm unsure if your insurance company will cover this. I'm so proud of you for your hard work to stop smoking. Keep it up.  It is VERY IMPORTANT that you do not smoke while you are using oxygen. Oxygen is highly explosive and you can get severe burns from a fire and/or explosion if you smoke while using oxygen.   I refilled your nebulizer medications with the same one we are using here.   Please make an appointment with your PCP in 1-2 weeks to recheck your blood pressure, and blood sugars. They may be able to wean you from oxygen at that time.  Durable medical equipment will be provided by [facility]. You requested a pulse oximeter. This is an over the counter device which can be purchased at any pharmacy. You may also purchase from [employer].

## 2024-05-21 NOTE — Progress Notes (Signed)
 SATURATION QUALIFICATIONS: (This note is used to comply with regulatory documentation for home oxygen)  Patient Saturations on Room Air at Rest = 96%  Patient Saturations on Room Air while Ambulating = 83-84%  Patient Saturations on 3 Liters of oxygen while Ambulating = 89-91%. Patient saturates to 93-94% on 4 Liters of oxygen.   Please briefly explain why patient needs home oxygen: Patient's saturation decreases to 83-84% on room air while ambulating.

## 2024-05-23 LAB — CULTURE, BLOOD (ROUTINE X 2)
Culture: NO GROWTH
Culture: NO GROWTH
Special Requests: ADEQUATE
Special Requests: ADEQUATE

## 2024-09-14 ENCOUNTER — Inpatient Hospital Stay

## 2024-09-15 ENCOUNTER — Inpatient Hospital Stay

## 2024-09-15 ENCOUNTER — Inpatient Hospital Stay: Admitting: Oncology

## 2024-09-15 ENCOUNTER — Inpatient Hospital Stay: Admitting: Nurse Practitioner
